# Patient Record
Sex: Male | Born: 1945
Health system: Southern US, Community
[De-identification: ages and names within clinical notes are randomized; demographics above are authoritative.]

## PROBLEM LIST (undated history)

## (undated) DIAGNOSIS — I10 Essential (primary) hypertension: Secondary | ICD-10-CM

## (undated) DIAGNOSIS — I2510 Atherosclerotic heart disease of native coronary artery without angina pectoris: Secondary | ICD-10-CM

## (undated) DIAGNOSIS — D179 Benign lipomatous neoplasm, unspecified: Secondary | ICD-10-CM

## (undated) DIAGNOSIS — T4145XA Adverse effect of unspecified anesthetic, initial encounter: Secondary | ICD-10-CM

## (undated) DIAGNOSIS — K5730 Diverticulosis of large intestine without perforation or abscess without bleeding: Secondary | ICD-10-CM

## (undated) DIAGNOSIS — G4733 Obstructive sleep apnea (adult) (pediatric): Secondary | ICD-10-CM

## (undated) DIAGNOSIS — E669 Obesity, unspecified: Secondary | ICD-10-CM

## (undated) DIAGNOSIS — D696 Thrombocytopenia, unspecified: Secondary | ICD-10-CM

## (undated) DIAGNOSIS — T8859XA Other complications of anesthesia, initial encounter: Secondary | ICD-10-CM

## (undated) DIAGNOSIS — N201 Calculus of ureter: Secondary | ICD-10-CM

## (undated) DIAGNOSIS — Z8719 Personal history of other diseases of the digestive system: Secondary | ICD-10-CM

## (undated) DIAGNOSIS — M25562 Pain in left knee: Secondary | ICD-10-CM

## (undated) DIAGNOSIS — E559 Vitamin D deficiency, unspecified: Secondary | ICD-10-CM

## (undated) DIAGNOSIS — Z77098 Contact with and (suspected) exposure to other hazardous, chiefly nonmedicinal, chemicals: Secondary | ICD-10-CM

## (undated) DIAGNOSIS — E785 Hyperlipidemia, unspecified: Secondary | ICD-10-CM

## (undated) DIAGNOSIS — E119 Type 2 diabetes mellitus without complications: Secondary | ICD-10-CM

## (undated) DIAGNOSIS — N200 Calculus of kidney: Secondary | ICD-10-CM

## (undated) DIAGNOSIS — M25561 Pain in right knee: Secondary | ICD-10-CM

## (undated) HISTORY — DX: Obesity, unspecified: E66.9

## (undated) HISTORY — DX: Vitamin D deficiency, unspecified: E55.9

## (undated) HISTORY — DX: Hyperlipidemia, unspecified: E78.5

## (undated) HISTORY — DX: Pain in left knee: M25.562

## (undated) HISTORY — PX: COLONOSCOPY W/ POLYPECTOMY: SHX1380

## (undated) HISTORY — DX: Calculus of kidney: N20.0

## (undated) HISTORY — DX: Obstructive sleep apnea (adult) (pediatric): G47.33

## (undated) HISTORY — DX: Contact with and (suspected) exposure to other hazardous, chiefly nonmedicinal, chemicals: Z77.098

## (undated) HISTORY — DX: Thrombocytopenia, unspecified: D69.6

## (undated) HISTORY — DX: Pain in right knee: M25.561

---

## 1973-03-15 HISTORY — PX: LIPOMA EXCISION: SHX5283

## 2010-12-02 ENCOUNTER — Other Ambulatory Visit: Payer: Self-pay | Admitting: Orthopedic Surgery

## 2010-12-02 DIAGNOSIS — M25561 Pain in right knee: Secondary | ICD-10-CM

## 2010-12-02 DIAGNOSIS — R531 Weakness: Secondary | ICD-10-CM

## 2010-12-07 ENCOUNTER — Ambulatory Visit
Admission: RE | Admit: 2010-12-07 | Discharge: 2010-12-07 | Disposition: A | Discharge status: Home / Self Care | Payer: Medicare Other | Source: Ambulatory Visit | Attending: Orthopedic Surgery | Admitting: Orthopedic Surgery

## 2010-12-07 DIAGNOSIS — R531 Weakness: Secondary | ICD-10-CM

## 2010-12-07 DIAGNOSIS — M25561 Pain in right knee: Secondary | ICD-10-CM

## 2011-01-29 HISTORY — PX: KNEE ARTHROSCOPY W/ MENISCECTOMY: SHX1879

## 2011-03-17 DIAGNOSIS — IMO0002 Reserved for concepts with insufficient information to code with codable children: Secondary | ICD-10-CM | POA: Diagnosis not present

## 2011-03-19 DIAGNOSIS — IMO0002 Reserved for concepts with insufficient information to code with codable children: Secondary | ICD-10-CM | POA: Diagnosis not present

## 2011-03-22 DIAGNOSIS — IMO0002 Reserved for concepts with insufficient information to code with codable children: Secondary | ICD-10-CM | POA: Diagnosis not present

## 2011-03-24 DIAGNOSIS — IMO0002 Reserved for concepts with insufficient information to code with codable children: Secondary | ICD-10-CM | POA: Diagnosis not present

## 2011-03-26 DIAGNOSIS — IMO0002 Reserved for concepts with insufficient information to code with codable children: Secondary | ICD-10-CM | POA: Diagnosis not present

## 2011-03-29 DIAGNOSIS — IMO0002 Reserved for concepts with insufficient information to code with codable children: Secondary | ICD-10-CM | POA: Diagnosis not present

## 2011-03-31 DIAGNOSIS — IMO0002 Reserved for concepts with insufficient information to code with codable children: Secondary | ICD-10-CM | POA: Diagnosis not present

## 2011-04-02 DIAGNOSIS — IMO0002 Reserved for concepts with insufficient information to code with codable children: Secondary | ICD-10-CM | POA: Diagnosis not present

## 2011-04-06 DIAGNOSIS — IMO0002 Reserved for concepts with insufficient information to code with codable children: Secondary | ICD-10-CM | POA: Diagnosis not present

## 2011-04-07 DIAGNOSIS — M23329 Other meniscus derangements, posterior horn of medial meniscus, unspecified knee: Secondary | ICD-10-CM | POA: Diagnosis not present

## 2011-04-07 DIAGNOSIS — M1710 Unilateral primary osteoarthritis, unspecified knee: Secondary | ICD-10-CM | POA: Diagnosis not present

## 2011-04-14 DIAGNOSIS — M1710 Unilateral primary osteoarthritis, unspecified knee: Secondary | ICD-10-CM | POA: Diagnosis not present

## 2011-04-19 DIAGNOSIS — M1710 Unilateral primary osteoarthritis, unspecified knee: Secondary | ICD-10-CM | POA: Diagnosis not present

## 2011-04-19 DIAGNOSIS — M23329 Other meniscus derangements, posterior horn of medial meniscus, unspecified knee: Secondary | ICD-10-CM | POA: Diagnosis not present

## 2011-05-11 DIAGNOSIS — E119 Type 2 diabetes mellitus without complications: Secondary | ICD-10-CM | POA: Diagnosis not present

## 2011-05-11 DIAGNOSIS — I10 Essential (primary) hypertension: Secondary | ICD-10-CM | POA: Diagnosis not present

## 2011-05-11 DIAGNOSIS — E785 Hyperlipidemia, unspecified: Secondary | ICD-10-CM | POA: Diagnosis not present

## 2011-06-13 ENCOUNTER — Encounter (HOSPITAL_COMMUNITY): Payer: Self-pay | Admitting: *Deleted

## 2011-06-13 ENCOUNTER — Emergency Department (HOSPITAL_COMMUNITY): Payer: Medicare Other

## 2011-06-13 ENCOUNTER — Inpatient Hospital Stay (HOSPITAL_COMMUNITY)
Admission: EM | Admit: 2011-06-13 | Discharge: 2011-06-18 | DRG: 390 | Disposition: A | Discharge status: Home / Self Care | Payer: Medicare Other | Attending: Internal Medicine | Admitting: Internal Medicine

## 2011-06-13 DIAGNOSIS — K56609 Unspecified intestinal obstruction, unspecified as to partial versus complete obstruction: Principal | ICD-10-CM | POA: Diagnosis present

## 2011-06-13 DIAGNOSIS — Z79899 Other long term (current) drug therapy: Secondary | ICD-10-CM

## 2011-06-13 DIAGNOSIS — R1013 Epigastric pain: Secondary | ICD-10-CM | POA: Diagnosis not present

## 2011-06-13 DIAGNOSIS — R109 Unspecified abdominal pain: Secondary | ICD-10-CM | POA: Diagnosis not present

## 2011-06-13 DIAGNOSIS — I10 Essential (primary) hypertension: Secondary | ICD-10-CM

## 2011-06-13 DIAGNOSIS — Z6833 Body mass index (BMI) 33.0-33.9, adult: Secondary | ICD-10-CM

## 2011-06-13 DIAGNOSIS — R1011 Right upper quadrant pain: Secondary | ICD-10-CM | POA: Diagnosis not present

## 2011-06-13 DIAGNOSIS — E119 Type 2 diabetes mellitus without complications: Secondary | ICD-10-CM | POA: Diagnosis present

## 2011-06-13 DIAGNOSIS — R197 Diarrhea, unspecified: Secondary | ICD-10-CM | POA: Diagnosis present

## 2011-06-13 DIAGNOSIS — K7689 Other specified diseases of liver: Secondary | ICD-10-CM | POA: Diagnosis not present

## 2011-06-13 DIAGNOSIS — Z7982 Long term (current) use of aspirin: Secondary | ICD-10-CM

## 2011-06-13 DIAGNOSIS — Z8719 Personal history of other diseases of the digestive system: Secondary | ICD-10-CM

## 2011-06-13 DIAGNOSIS — Z87891 Personal history of nicotine dependence: Secondary | ICD-10-CM

## 2011-06-13 DIAGNOSIS — E785 Hyperlipidemia, unspecified: Secondary | ICD-10-CM | POA: Diagnosis present

## 2011-06-13 DIAGNOSIS — Z96659 Presence of unspecified artificial knee joint: Secondary | ICD-10-CM

## 2011-06-13 DIAGNOSIS — R112 Nausea with vomiting, unspecified: Secondary | ICD-10-CM | POA: Diagnosis present

## 2011-06-13 DIAGNOSIS — R1084 Generalized abdominal pain: Secondary | ICD-10-CM | POA: Diagnosis present

## 2011-06-13 DIAGNOSIS — K5289 Other specified noninfective gastroenteritis and colitis: Secondary | ICD-10-CM | POA: Diagnosis present

## 2011-06-13 DIAGNOSIS — Z794 Long term (current) use of insulin: Secondary | ICD-10-CM

## 2011-06-13 DIAGNOSIS — Z888 Allergy status to other drugs, medicaments and biological substances status: Secondary | ICD-10-CM

## 2011-06-13 HISTORY — DX: Essential (primary) hypertension: I10

## 2011-06-13 LAB — CBC
MCH: 32.5 pg (ref 26.0–34.0)
MCHC: 36.6 g/dL — ABNORMAL HIGH (ref 30.0–36.0)
MCV: 88.8 fL (ref 78.0–100.0)
Platelets: 184 10*3/uL (ref 150–400)

## 2011-06-13 LAB — COMPREHENSIVE METABOLIC PANEL
ALT: 35 U/L (ref 0–53)
Albumin: 3.9 g/dL (ref 3.5–5.2)
Alkaline Phosphatase: 88 U/L (ref 39–117)
BUN: 21 mg/dL (ref 6–23)
Calcium: 9 mg/dL (ref 8.4–10.5)
GFR calc Af Amer: >90 mL/min (ref >90)
Glucose, Bld: 301 mg/dL — ABNORMAL HIGH (ref 70–99)
Potassium: 4.6 mEq/L (ref 3.5–5.1)
Sodium: 133 mEq/L — ABNORMAL LOW (ref 135–145)
Total Protein: 7.6 g/dL (ref 6.0–8.3)

## 2011-06-13 LAB — DIFFERENTIAL
Basophils Relative: 0 % (ref 0–1)
Eosinophils Absolute: 0 10*3/uL (ref 0.0–0.7)
Eosinophils Relative: 0 % (ref 0–5)
Lymphs Abs: 0.7 10*3/uL (ref 0.7–4.0)
Neutrophils Relative %: 86 % — ABNORMAL HIGH (ref 43–77)

## 2011-06-13 LAB — GLUCOSE, CAPILLARY: Glucose-Capillary: 321 mg/dL — ABNORMAL HIGH (ref 70–99)

## 2011-06-13 MED ORDER — HYDROMORPHONE HCL PF 1 MG/ML IJ SOLN
1.0000 mg | Freq: Once | INTRAMUSCULAR | Status: AC
  Administered 2011-06-14: 1 mg via INTRAVENOUS
  Filled 2011-06-13: qty 1

## 2011-06-13 MED ORDER — IOHEXOL 300 MG/ML  SOLN
100.0000 mL | Freq: Once | INTRAMUSCULAR | Status: AC | PRN
  Administered 2011-06-13: 100 mL via INTRAVENOUS

## 2011-06-13 MED ORDER — ONDANSETRON HCL 4 MG/2ML IJ SOLN
4.0000 mg | Freq: Once | INTRAMUSCULAR | Status: AC
  Administered 2011-06-13: 4 mg via INTRAVENOUS
  Filled 2011-06-13: qty 2

## 2011-06-13 MED ORDER — MORPHINE SULFATE 4 MG/ML IJ SOLN
4.0000 mg | Freq: Once | INTRAMUSCULAR | Status: AC
  Administered 2011-06-13: 4 mg via INTRAVENOUS
  Filled 2011-06-13: qty 1

## 2011-06-13 MED ORDER — SODIUM CHLORIDE 0.9 % IV BOLUS (SEPSIS)
1000.0000 mL | Freq: Once | INTRAVENOUS | Status: AC
  Administered 2011-06-13: 1000 mL via INTRAVENOUS

## 2011-06-13 MED ORDER — IOHEXOL 300 MG/ML  SOLN
40.0000 mL | Freq: Once | INTRAMUSCULAR | Status: AC | PRN
  Administered 2011-06-13: 40 mL via ORAL

## 2011-06-13 NOTE — ED Notes (Signed)
PT provided with fluids to rinse mouth. Family at bedside

## 2011-06-13 NOTE — ED Provider Notes (Signed)
History     CSN: [id number]  Arrival date & time 06/13/11  2057   First MD Initiated Contact with Patient 06/13/11 2124      Chief Complaint  Patient presents with  . Abdominal Pain    (Consider location/radiation/quality/duration/timing/severity/associated sxs/prior treatment) Patient is a 66 y.o. male presenting with abdominal pain. The history is provided by the patient and the spouse.  Abdominal Pain The primary symptoms of the illness include abdominal pain, nausea, vomiting and diarrhea. The primary symptoms of the illness do not include fever, shortness of breath, hematemesis, hematochezia or dysuria. The current episode started yesterday. The onset of the illness was sudden. The problem has been gradually worsening.  The abdominal pain is located in the RUQ, epigastric region and LUQ. The abdominal pain does not radiate. The severity of the abdominal pain is 4/10. The abdominal pain is relieved by nothing. The abdominal pain is exacerbated by belching.  Vomiting occurs 2 to 5 times per day. The emesis contains stomach contents.  The diarrhea occurs 5 to 10 times per day.  Symptoms associated with the illness do not include chills or constipation.    Past Medical History  Diagnosis Date  . Diabetes mellitus   . Hypertension     History reviewed. No pertinent past surgical history.  No family history on file.  History  Substance Use Topics  . Smoking status: Never Smoker   . Smokeless tobacco: Not on file  . Alcohol Use: No      Review of Systems  Constitutional: Negative for fever and chills.  HENT: Negative for congestion and rhinorrhea.   Respiratory: Negative for cough and shortness of breath.   Cardiovascular: Negative for chest pain and leg swelling.  Gastrointestinal: Positive for nausea, vomiting, abdominal pain, diarrhea and abdominal distention. Negative for constipation, blood in stool, hematochezia and hematemesis.  Genitourinary: Negative for  dysuria and decreased urine volume.  Neurological: Negative for numbness and headaches.  Psychiatric/Behavioral: Negative for confusion.  All other systems reviewed and are negative.    Allergies  Ciprofloxacin; Lipitor; Lisinopril; and Metformin and related  Home Medications   Current Outpatient Rx  Name Route Sig Dispense Refill  . ACETAMINOPHEN 500 MG PO TABS Oral Take 1,000 mg by mouth every 6 (six) hours as needed. For pain    . ASPIRIN EC 81 MG PO TBEC Oral Take 81 mg by mouth daily.    [name] GLIPIZIDE ER 10 MG PO TB24 Oral Take 10 mg by mouth 2 (two) times daily.    . INSULIN DETEMIR 100 UNIT/ML [place] SOLN Subcutaneous Inject 90 Units into the skin at bedtime.    [name] LOSARTAN POTASSIUM 50 MG PO TABS Oral Take 50 mg by mouth daily.    . CENTRUM SILVER ULTRA MENS PO Oral Take 1 tablet by mouth daily.    [name] SIMVASTATIN 20 MG PO TABS Oral Take 20 mg by mouth every evening.      BP 163/85  Pulse 111  Temp(Src) 97.5 F (36.4 C) (Oral)  Resp 20  SpO2 95%  Physical Exam  Nursing note and vitals reviewed. Constitutional: He is oriented to person, place, and time. He appears well-developed and well-nourished.  HENT:  Head: Normocephalic and atraumatic.  Right Ear: External ear normal.  Left Ear: External ear normal.  Nose: Nose normal.  Neck: Neck supple.  Cardiovascular: Normal rate, regular rhythm, normal heart sounds and intact distal pulses.   Pulmonary/Chest: Effort normal and breath sounds normal. No respiratory distress. He has  no wheezes. He has no rales.  Abdominal: Soft. He exhibits distension. He exhibits no fluid wave, no ascites and no mass. There is tenderness in the right upper quadrant, right lower quadrant, epigastric area and left upper quadrant. There is no rebound and no guarding.  Musculoskeletal: He exhibits no edema.  Lymphadenopathy:    He has no cervical adenopathy.  Neurological: He is alert and oriented to person, place, and time.  Skin: Skin is warm and  dry.    ED Course  Procedures (including critical care time)  Labs Reviewed  CBC - Abnormal; Notable for the following:    Hemoglobin 17.7 (*)    MCHC 36.6 (*)    All other components within normal limits  DIFFERENTIAL - Abnormal; Notable for the following:    Neutrophils Relative 86 (*)    Lymphocytes Relative 9 (*)    All other components within normal limits  COMPREHENSIVE METABOLIC PANEL - Abnormal; Notable for the following:    Sodium 133 (*)    Chloride 95 (*)    Glucose, Bld 301 (*)    AST 39 (*) HEMOLYSIS AT THIS LEVEL MAY AFFECT RESULT   All other components within normal limits  GLUCOSE, CAPILLARY - Abnormal; Notable for the following:    Glucose-Capillary 321 (*)    All other components within normal limits  LIPASE, BLOOD - Abnormal; Notable for the following:    Lipase 9 (*)    All other components within normal limits  URINALYSIS, ROUTINE W REFLEX MICROSCOPIC   Ct Abdomen Pelvis W Contrast  06/13/2011  *RADIOLOGY REPORT*  Clinical Data: Bilateral mid and lower abdominal pain for 24 hours. Nausea and vomiting.  CT ABDOMEN AND PELVIS WITH CONTRAST  Technique:  Multidetector CT imaging of the abdomen and pelvis was performed following the standard protocol during bolus administration of intravenous contrast.  Contrast: OMNIPAQUE IOHEXOL 300 MG/ML IJ SOLN, 40mL OMNIPAQUE IOHEXOL 300 MG/ML IJ SOLN  Comparison: None.  Findings: Atelectasis in the lung bases.  Probable coronary artery calcifications.  Diffuse low attenuation change throughout the liver consistent with fatty infiltration.  The gallbladder, spleen, adrenal glands, kidneys, pancreas, and retroperitoneal lymph nodes are unremarkable.  Mild calcification of the abdominal aorta without aneurysm.  The stomach is mildly distended without focal wall thickening.  The there is mild distension of mostly fluid-filled small bowel extending throughout the jejunum with decompression of the terminal ileum.  The transition  zone is located in the right lower quadrant.  No mass or obstructing lesion is demonstrated. Changes suggest an adhesion.  No free fluid or free air in the abdomen.  Prominent visceral adipose tissues.  Pelvis:  Prostate gland is not enlarged.  No free or loculated pelvic fluid collections.  No significant pelvic lymphadenopathy. No inflammatory changes involving the sigmoid colon.  The colon is mostly decompressed without wall thickening.  The appendix is normal.  Degenerative changes in the lumbar spine.  The  IMPRESSION: Small bowel obstruction with transition zone in the right lower quadrant.  No obstructing mass lesion demonstrated.  Changes likely due to adhesion or focal stricture.  Diffuse fatty infiltration of the liver.  Original Report Authenticated By: Marlon Pel, M.D.     1. Small bowel obstruction       MDM  66 yo male presents with 24 hours of N/V/D and diffuse abd pain. Not peritonitic. Tachycardic which improved with fluids, likely from pain and dehyrdation. CT scan shows SBO with transition in RLQ. Dr. Lindie Spruce of  surgery consulted, feels should start non-op and admit to medicine.        Pricilla Loveless, MD 06/14/11 0040

## 2011-06-13 NOTE — ED Notes (Signed)
Pt c/o  abd pain with n v for 24 hours.  Some diarrhea also

## 2011-06-13 NOTE — ED Notes (Signed)
PT repositioned.

## 2011-06-13 NOTE — ED Notes (Signed)
Pt wife states that he has been having abdominal pain since Friday. Pt states that the pain did start in one spot now is generalized. Pt states that he has been vomiting and having diarrhea for the past couple of days. Pt is Diabetic and has not been taking insulin or CBG's due to not eating. Pt wife states that he appears bloated more than normal. Bowel sounds present in all quadrants.

## 2011-06-14 ENCOUNTER — Encounter (HOSPITAL_COMMUNITY): Payer: Self-pay | Admitting: *Deleted

## 2011-06-14 DIAGNOSIS — K5289 Other specified noninfective gastroenteritis and colitis: Secondary | ICD-10-CM | POA: Diagnosis present

## 2011-06-14 DIAGNOSIS — Z888 Allergy status to other drugs, medicaments and biological substances status: Secondary | ICD-10-CM | POA: Diagnosis not present

## 2011-06-14 DIAGNOSIS — R1011 Right upper quadrant pain: Secondary | ICD-10-CM | POA: Diagnosis not present

## 2011-06-14 DIAGNOSIS — R112 Nausea with vomiting, unspecified: Secondary | ICD-10-CM | POA: Diagnosis present

## 2011-06-14 DIAGNOSIS — Z794 Long term (current) use of insulin: Secondary | ICD-10-CM | POA: Diagnosis not present

## 2011-06-14 DIAGNOSIS — Z7982 Long term (current) use of aspirin: Secondary | ICD-10-CM | POA: Diagnosis not present

## 2011-06-14 DIAGNOSIS — R1084 Generalized abdominal pain: Secondary | ICD-10-CM | POA: Diagnosis present

## 2011-06-14 DIAGNOSIS — Z79899 Other long term (current) drug therapy: Secondary | ICD-10-CM | POA: Diagnosis not present

## 2011-06-14 DIAGNOSIS — Z87891 Personal history of nicotine dependence: Secondary | ICD-10-CM | POA: Diagnosis not present

## 2011-06-14 DIAGNOSIS — E785 Hyperlipidemia, unspecified: Secondary | ICD-10-CM | POA: Diagnosis present

## 2011-06-14 DIAGNOSIS — R1013 Epigastric pain: Secondary | ICD-10-CM | POA: Diagnosis not present

## 2011-06-14 DIAGNOSIS — Z96659 Presence of unspecified artificial knee joint: Secondary | ICD-10-CM | POA: Diagnosis not present

## 2011-06-14 DIAGNOSIS — K56609 Unspecified intestinal obstruction, unspecified as to partial versus complete obstruction: Secondary | ICD-10-CM | POA: Diagnosis not present

## 2011-06-14 DIAGNOSIS — IMO0001 Reserved for inherently not codable concepts without codable children: Secondary | ICD-10-CM | POA: Diagnosis not present

## 2011-06-14 DIAGNOSIS — Z8719 Personal history of other diseases of the digestive system: Secondary | ICD-10-CM

## 2011-06-14 DIAGNOSIS — I10 Essential (primary) hypertension: Secondary | ICD-10-CM | POA: Diagnosis present

## 2011-06-14 DIAGNOSIS — E119 Type 2 diabetes mellitus without complications: Secondary | ICD-10-CM | POA: Diagnosis present

## 2011-06-14 DIAGNOSIS — R109 Unspecified abdominal pain: Secondary | ICD-10-CM | POA: Diagnosis not present

## 2011-06-14 DIAGNOSIS — R197 Diarrhea, unspecified: Secondary | ICD-10-CM | POA: Diagnosis present

## 2011-06-14 DIAGNOSIS — Z6833 Body mass index (BMI) 33.0-33.9, adult: Secondary | ICD-10-CM | POA: Diagnosis not present

## 2011-06-14 LAB — HEMOGLOBIN A1C
Hgb A1c MFr Bld: 8.9 % — ABNORMAL HIGH (ref <5.7)
Mean Plasma Glucose: 209 mg/dL — ABNORMAL HIGH (ref <117)

## 2011-06-14 LAB — URINALYSIS, ROUTINE W REFLEX MICROSCOPIC
Bilirubin Urine: NEGATIVE
Hgb urine dipstick: NEGATIVE
Ketones, ur: 15 mg/dL — AB
Nitrite: NEGATIVE
Protein, ur: NEGATIVE mg/dL
Urobilinogen, UA: 0.2 mg/dL (ref 0.0–1.0)

## 2011-06-14 LAB — GLUCOSE, CAPILLARY
Glucose-Capillary: 259 mg/dL — ABNORMAL HIGH (ref 70–99)
Glucose-Capillary: 223 mg/dL — ABNORMAL HIGH (ref 70–99)
Glucose-Capillary: 204 mg/dL — ABNORMAL HIGH (ref 70–99)

## 2011-06-14 LAB — BASIC METABOLIC PANEL
Chloride: 102 mEq/L (ref 96–112)
GFR calc Af Amer: >90 mL/min (ref >90)
GFR calc non Af Amer: >90 mL/min (ref >90)
Potassium: 4 mEq/L (ref 3.5–5.1)
Sodium: 137 mEq/L (ref 135–145)

## 2011-06-14 LAB — CBC
Hemoglobin: 15.5 g/dL (ref 13.0–17.0)
RBC: 4.88 MIL/uL (ref 4.22–5.81)

## 2011-06-14 MED ORDER — HYDROMORPHONE HCL PF 1 MG/ML IJ SOLN
0.5000 mg | INTRAMUSCULAR | Status: DC | PRN
  Administered 2011-06-14 (×2): 1 mg via INTRAVENOUS
  Filled 2011-06-14 (×2): qty 1

## 2011-06-14 MED ORDER — CHLORPROMAZINE HCL 25 MG/ML IJ SOLN
25.0000 mg | Freq: Once | INTRAMUSCULAR | Status: AC | PRN
  Filled 2011-06-14: qty 1

## 2011-06-14 MED ORDER — INSULIN ASPART 100 UNIT/ML ~~LOC~~ SOLN
0.0000 [IU] | SUBCUTANEOUS | Status: DC
  Administered 2011-06-14: 3 [IU] via SUBCUTANEOUS
  Administered 2011-06-14: 5 [IU] via SUBCUTANEOUS
  Administered 2011-06-14: 3 [IU] via SUBCUTANEOUS
  Administered 2011-06-14: 7 [IU] via SUBCUTANEOUS

## 2011-06-14 MED ORDER — ONDANSETRON HCL 4 MG/2ML IJ SOLN: 4.0000 mg | Freq: Four times a day (QID) | INTRAMUSCULAR | Status: DC | PRN

## 2011-06-14 MED ORDER — INSULIN DETEMIR 100 UNIT/ML ~~LOC~~ SOLN
15.0000 [IU] | Freq: Every day | SUBCUTANEOUS | Status: DC
  Administered 2011-06-14: 15 [IU] via SUBCUTANEOUS
  Filled 2011-06-14: qty 10

## 2011-06-14 MED ORDER — INSULIN DETEMIR 100 UNIT/ML ~~LOC~~ SOLN
40.0000 [IU] | Freq: Every day | SUBCUTANEOUS | Status: DC
  Filled 2011-06-14: qty 10

## 2011-06-14 MED ORDER — INSULIN ASPART 100 UNIT/ML ~~LOC~~ SOLN
0.0000 [IU] | SUBCUTANEOUS | Status: DC
  Administered 2011-06-14: 3 [IU] via SUBCUTANEOUS
  Administered 2011-06-14: 5 [IU] via SUBCUTANEOUS
  Administered 2011-06-15: 2 [IU] via SUBCUTANEOUS
  Administered 2011-06-15 (×3): 3 [IU] via SUBCUTANEOUS
  Administered 2011-06-16 (×2): 2 [IU] via SUBCUTANEOUS
  Administered 2011-06-17: 5 [IU] via SUBCUTANEOUS
  Administered 2011-06-17 – 2011-06-18 (×3): 3 [IU] via SUBCUTANEOUS

## 2011-06-14 MED ORDER — BIOTENE DRY MOUTH MT LIQD
15.0000 mL | Freq: Two times a day (BID) | OROMUCOSAL | Status: DC
  Administered 2011-06-14 – 2011-06-17 (×7): 15 mL via OROMUCOSAL

## 2011-06-14 MED ORDER — ENOXAPARIN SODIUM 40 MG/0.4ML ~~LOC~~ SOLN
40.0000 mg | SUBCUTANEOUS | Status: DC
  Administered 2011-06-14: 40 mg via SUBCUTANEOUS
  Filled 2011-06-14 (×2): qty 0.4

## 2011-06-14 MED ORDER — HYDROMORPHONE HCL PF 1 MG/ML IJ SOLN
0.5000 mg | INTRAMUSCULAR | Status: DC | PRN
  Administered 2011-06-14: 0.5 mg via INTRAVENOUS
  Administered 2011-06-14: 20:00:00 via INTRAVENOUS
  Administered 2011-06-15 (×3): 0.5 mg via INTRAVENOUS
  Filled 2011-06-14 (×5): qty 1

## 2011-06-14 MED ORDER — PHENOL 1.4 % MT LIQD
1.0000 | OROMUCOSAL | Status: DC | PRN
  Administered 2011-06-14 (×3): 1 via OROMUCOSAL
  Filled 2011-06-14: qty 177

## 2011-06-14 MED ORDER — SODIUM CHLORIDE 0.9 % IV SOLN
INTRAVENOUS | Status: DC
  Administered 2011-06-14: 1000 mL via INTRAVENOUS
  Administered 2011-06-14: 125 mL/h via INTRAVENOUS
  Administered 2011-06-14: 01:00:00 via INTRAVENOUS

## 2011-06-14 MED ORDER — ACETAMINOPHEN 650 MG RE SUPP: 650.0000 mg | Freq: Four times a day (QID) | RECTAL | Status: DC | PRN

## 2011-06-14 MED ORDER — ACETAMINOPHEN 325 MG PO TABS: 650.0000 mg | ORAL_TABLET | Freq: Four times a day (QID) | ORAL | Status: DC | PRN

## 2011-06-14 MED ORDER — ONDANSETRON HCL 4 MG PO TABS: 4.0000 mg | ORAL_TABLET | Freq: Four times a day (QID) | ORAL | Status: DC | PRN

## 2011-06-14 NOTE — Consult Note (Signed)
Renard Hamper 1945-07-28  [id number].   Primary Care MD: Dr. Lawerance Bach, [facility]) Requesting MD: Dr. Jerral Ralph  Chief Complaint/Reason for Consult: SBO HPI: This is a 66 yo male who has never had abdominal surgery who began having abdominal pain with n/v/d on Saturday night.  His last BM was on Sunday morning at 0600am.  Since then he has not passed any flatus or had any other BMs.  He has continued to have nausea and vomiting along with pain.  He went to [facility] last night.  He had a CT scan completed that revealed a SBO with a transition point.  He had an NGT placed and we have been asked to see him for further evaluation.  He does note he had a SBO in 1987 after having a virus, but improved with conservative management.  Review of Systems:  Please see HPI, otherwise all other systems are negative except for some "locking" of his right knee.  FH: 1. Mother- ovarian Ca 2. Father - Skin Ca  Past Medical History  Diagnosis Date  . Diabetes mellitus   . Hypertension     Past Surgical History  Procedure Date  . Right knee arthroscopy     Social History:  reports that he quit smoking about 44 years ago. His smoking use included Cigarettes. He has never used smokeless tobacco. He reports that he does not drink alcohol or use illicit drugs.  Allergies:  Allergies  Allergen Reactions  . Ciprofloxacin Other (See Comments)    Abdominal pain  . Lipitor (Atorvastatin Calcium) Other (See Comments)    Severe muscle pain  . Lisinopril Cough  . Metformin And Related Other (See Comments)    diarrhea    Medications Prior to Admission  Medication Dose Route Frequency Provider Last Rate Last Dose  . 0.9 %  sodium chloride infusion   Intravenous Continuous Letha Cape, PA 100 mL/hr at 06/14/11 0120    . acetaminophen (TYLENOL) tablet 650 mg  650 mg Oral Q6H PRN Ron Parker, MD       Or  . acetaminophen (TYLENOL) suppository 650 mg  650 mg Rectal Q6H PRN Ron Parker, MD       . antiseptic oral rinse (BIOTENE) solution 15 mL  15 mL Mouth Rinse BID Ron Parker, MD   15 mL at 06/14/11 0806  . chlorproMAZINE (THORAZINE) injection 25 mg  25 mg Intramuscular Once PRN Ron Parker, MD      . enoxaparin (LOVENOX) injection 40 mg  40 mg Subcutaneous Q24H Harvette C Lovell Sheehan, MD      . HYDROmorphone (DILAUDID) injection 0.5-1 mg  0.5-1 mg Intravenous Q3H PRN Ron Parker, MD   1 mg at 06/14/11 0755  . HYDROmorphone (DILAUDID) injection 1 mg  1 mg Intravenous Once Pricilla Loveless, MD   1 mg at 06/14/11 0001  . insulin aspart (novoLOG) injection 0-9 Units  0-9 Units Subcutaneous Q4H Ron Parker, MD   5 Units at 06/14/11 0429  . insulin detemir (LEVEMIR) injection 40 Units  40 Units Subcutaneous QHS Harvette C Jenkins, MD      . iohexol (OMNIPAQUE) 300 MG/ML solution 100 mL  100 mL Intravenous Once PRN Felisa Bonier, MD   100 mL at 06/13/11 2331  . iohexol (OMNIPAQUE) 300 MG/ML solution 40 mL  40 mL Oral Once PRN Felisa Bonier, MD   40 mL at 06/13/11 2331  . morphine 4 MG/ML injection 4 mg  4 mg Intravenous  Once Pricilla Loveless, MD   4 mg at 06/13/11 2220  . ondansetron (ZOFRAN) injection 4 mg  4 mg Intravenous Once Pricilla Loveless, MD   4 mg at 06/13/11 2220  . ondansetron (ZOFRAN) tablet 4 mg  4 mg Oral Q6H PRN Ron Parker, MD       Or  . ondansetron (ZOFRAN) injection 4 mg  4 mg Intravenous Q6H PRN Ron Parker, MD      . sodium chloride 0.9 % bolus 1,000 mL  1,000 mL Intravenous Once Pricilla Loveless, MD   1,000 mL at 06/13/11 2212  . sodium chloride 0.9 % bolus 1,000 mL  1,000 mL Intravenous Once Pricilla Loveless, MD   1,000 mL at 06/13/11 2212   No current outpatient prescriptions on file as of 06/14/2011.    Blood pressure 149/82, pulse 107, temperature 98 F (36.7 C), temperature source Oral, resp. rate 18, SpO2 93.00%. Physical Exam:   General: pleasant, WD, WN white male who is laying in bed in NAD HEENT: head is normocephalic,  atraumatic.  Sclera are noninjected.  PERRL.  Ears and nose without any masses or lesions.  Mouth is pink and moist Heart: regular rhythm, slightly tachy.  Normal s1,s2. No obvious murmurs, gallops, or rubs noted.  Palpable radial and pedal pulses bilaterally Lungs: CTAB, no wheezes, rhonchi, or rales noted.  Respiratory effort nonlabored Abd: soft, but tympanetic, diffusely tender throughout the abdomen.  No guarding or rebounding.  No peritoneal signs.  Hypoactive BS.  Maybe a small reducible umbilical hernia, but no other masses, hernias, or organomegaly MS: all 4 extremities are symmetrical with no cyanosis, clubbing, or edema. Skin: warm and dry with no masses, lesions, or rashes Psych: A&Ox3 with an appropriate affect.     Results for orders placed during the hospital encounter of 06/13/11 (from the past 48 hour(s))  CBC     Status: Abnormal   Collection Time   06/13/11  9:15 PM      Component Value Range Comment   WBC 7.8  4.0 - 10.5 (K/uL)    RBC 5.45  4.22 - 5.81 (MIL/uL)    Hemoglobin 17.7 (*) 13.0 - 17.0 (g/dL)    HCT 45.4  09.8 - 11.9 (%)    MCV 88.8  78.0 - 100.0 (fL)    MCH 32.5  26.0 - 34.0 (pg)    MCHC 36.6 (*) 30.0 - 36.0 (g/dL)    RDW 14.7  82.9 - 56.2 (%)    Platelets 184  150 - 400 (K/uL)   DIFFERENTIAL     Status: Abnormal   Collection Time   06/13/11  9:15 PM      Component Value Range Comment   Neutrophils Relative 86 (*) 43 - 77 (%)    Neutro Abs 6.7  1.7 - 7.7 (K/uL)    Lymphocytes Relative 9 (*) 12 - 46 (%)    Lymphs Abs 0.7  0.7 - 4.0 (K/uL)    Monocytes Relative 5  3 - 12 (%)    Monocytes Absolute 0.4  0.1 - 1.0 (K/uL)    Eosinophils Relative 0  0 - 5 (%)    Eosinophils Absolute 0.0  0.0 - 0.7 (K/uL)    Basophils Relative 0  0 - 1 (%)    Basophils Absolute 0.0  0.0 - 0.1 (K/uL)   COMPREHENSIVE METABOLIC PANEL     Status: Abnormal   Collection Time   06/13/11  9:15 PM      Component Value Range  Comment   Sodium 133 (*) 135 - 145 (mEq/L)    Potassium  4.6  3.5 - 5.1 (mEq/L) HEMOLYSIS AT THIS LEVEL MAY AFFECT RESULT   Chloride 95 (*) 96 - 112 (mEq/L)    CO2 22  19 - 32 (mEq/L)    Glucose, Bld 301 (*) 70 - 99 (mg/dL)    BUN 21  6 - 23 (mg/dL)    Creatinine, Ser 1.61  0.50 - 1.35 (mg/dL)    Calcium 9.0  8.4 - 10.5 (mg/dL)    Total Protein 7.6  6.0 - 8.3 (g/dL)    Albumin 3.9  3.5 - 5.2 (g/dL)    AST 39 (*) 0 - 37 (U/L) HEMOLYSIS AT THIS LEVEL MAY AFFECT RESULT   ALT 35  0 - 53 (U/L)    Alkaline Phosphatase 88  39 - 117 (U/L)    Total Bilirubin 1.1  0.3 - 1.2 (mg/dL)    GFR calc non Af Amer >90  >90 (mL/min)    GFR calc Af Amer >90  >90 (mL/min)   LIPASE, BLOOD     Status: Abnormal   Collection Time   06/13/11  9:15 PM      Component Value Range Comment   Lipase 9 (*) 11 - 59 (U/L)   GLUCOSE, CAPILLARY     Status: Abnormal   Collection Time   06/13/11  9:37 PM      Component Value Range Comment   Glucose-Capillary 321 (*) 70 - 99 (mg/dL)   GLUCOSE, CAPILLARY     Status: Abnormal   Collection Time   06/14/11 12:58 AM      Component Value Range Comment   Glucose-Capillary 308 (*) 70 - 99 (mg/dL)   URINALYSIS, ROUTINE W REFLEX MICROSCOPIC     Status: Abnormal   Collection Time   06/14/11  2:16 AM      Component Value Range Comment   Color, Urine YELLOW  YELLOW     APPearance CLEAR  CLEAR     Specific Gravity, Urine 1.010  1.005 - 1.030     pH 5.0  5.0 - 8.0     Glucose, UA 500 (*) NEGATIVE (mg/dL)    Hgb urine dipstick NEGATIVE  NEGATIVE     Bilirubin Urine NEGATIVE  NEGATIVE     Ketones, ur 15 (*) NEGATIVE (mg/dL)    Protein, ur NEGATIVE  NEGATIVE (mg/dL)    Urobilinogen, UA 0.2  0.0 - 1.0 (mg/dL)    Nitrite NEGATIVE  NEGATIVE     Leukocytes, UA NEGATIVE  NEGATIVE  MICROSCOPIC NOT DONE ON URINES WITH NEGATIVE PROTEIN, BLOOD, LEUKOCYTES, NITRITE, OR GLUCOSE <1000 mg/dL.  GLUCOSE, CAPILLARY     Status: Abnormal   Collection Time   06/14/11  3:45 AM      Component Value Range Comment   Glucose-Capillary 259 (*) 70 - 99 (mg/dL)     BASIC METABOLIC PANEL     Status: Abnormal   Collection Time   06/14/11  6:35 AM      Component Value Range Comment   Sodium 137  135 - 145 (mEq/L)    Potassium 4.0  3.5 - 5.1 (mEq/L)    Chloride 102  96 - 112 (mEq/L)    CO2 27  19 - 32 (mEq/L)    Glucose, Bld 229 (*) 70 - 99 (mg/dL)    BUN 18  6 - 23 (mg/dL)    Creatinine, Ser 0.96  0.50 - 1.35 (mg/dL)    Calcium 8.2 (*) 8.4 -  10.5 (mg/dL)    GFR calc non Af Amer >90  >90 (mL/min)    GFR calc Af Amer >90  >90 (mL/min)   CBC     Status: Abnormal   Collection Time   06/14/11  6:35 AM      Component Value Range Comment   WBC 6.6  4.0 - 10.5 (K/uL)    RBC 4.88  4.22 - 5.81 (MIL/uL)    Hemoglobin 15.5  13.0 - 17.0 (g/dL)    HCT 95.6  21.3 - 08.6 (%)    MCV 88.9  78.0 - 100.0 (fL)    MCH 31.8  26.0 - 34.0 (pg)    MCHC 35.7  30.0 - 36.0 (g/dL)    RDW 57.8  46.9 - 62.9 (%)    Platelets 142 (*) 150 - 400 (K/uL)    Ct Abdomen Pelvis W Contrast  06/13/2011  *RADIOLOGY REPORT*  Clinical Data: Bilateral mid and lower abdominal pain for 24 hours. Nausea and vomiting.  CT ABDOMEN AND PELVIS WITH CONTRAST  Technique:  Multidetector CT imaging of the abdomen and pelvis was performed following the standard protocol during bolus administration of intravenous contrast.  Contrast: OMNIPAQUE IOHEXOL 300 MG/ML IJ SOLN, 40mL OMNIPAQUE IOHEXOL 300 MG/ML IJ SOLN  Comparison: None.  Findings: Atelectasis in the lung bases.  Probable coronary artery calcifications.  Diffuse low attenuation change throughout the liver consistent with fatty infiltration.  The gallbladder, spleen, adrenal glands, kidneys, pancreas, and retroperitoneal lymph nodes are unremarkable.  Mild calcification of the abdominal aorta without aneurysm.  The stomach is mildly distended without focal wall thickening.  The there is mild distension of mostly fluid-filled small bowel extending throughout the jejunum with decompression of the terminal ileum.  The transition zone is located in the  right lower quadrant.  No mass or obstructing lesion is demonstrated. Changes suggest an adhesion.  No free fluid or free air in the abdomen.  Prominent visceral adipose tissues.  Pelvis:  Prostate gland is not enlarged.  No free or loculated pelvic fluid collections.  No significant pelvic lymphadenopathy. No inflammatory changes involving the sigmoid colon.  The colon is mostly decompressed without wall thickening.  The appendix is normal.  Degenerative changes in the lumbar spine.  The  IMPRESSION: Small bowel obstruction with transition zone in the right lower quadrant.  No obstructing mass lesion demonstrated.  Changes likely due to adhesion or focal stricture.  Diffuse fatty infiltration of the liver.  Original Report Authenticated By: Marlon Pel, M.D.       Assessment/Plan 1. SBO, unknown etiology 2. DM 3. HTN  Plan: The patient has an obvious bowel obstruction on his CT scan.  He has never had prior abdominal surgery, hernias, or any masses seen on CT scan.  For now, we would recommend continued conservative management.  I have increased his fluids to 125cc/hr to help with his pulse secondary to some dehydration likely secondary to NGT output, and n/v/d.  He does concern me some given how much tenderness he has and how much pain medicine he is requiring.  His WBC is currently normal and no evidence of compromised bowel is seen.  We will recheck x-rays in the morning to evaluate for any change.  If he does not improve with conservative management, he may require an abdominal operation.  I have d/w the patient and his wife.  They understand and agree with the current plan.  Thank you for this consult.  Patient had a "small bowel  obstruction" 27 years ago managed by approx. 4 days of NGT.  The cause was never identified and the patient has had no trouble in 27 years.  He had a colonoscopy by Dr. Tyler Aas in [facility].  The wife has asked that if a GI consult is need here to get Dr. Ewing Schlein.   I talked to the wife on the phone and outlined our plan.  He is better, at this time, with the NGT in place.  DN  OSBORNE,KELLY E 06/14/2011, 8:16 AM  Ovidio Kin, MD, [facility] Surgery Pager: [phone] Office phone:  [phone]

## 2011-06-14 NOTE — H&P (Addendum)
DATE OF ADMISSION:  06/14/2011  PCP:    Gildardo Griffes, MD, MD   Chief Complaint: ABD pain Nausea and Vomiting    HPI: Michael Cunningham is an 66 y.o. male who presents to the ED with complaints of ABD Pain Nausae and vomiting and diarrhea since 5pm the day before.  He reports having generalized ABD Pain, and denies having any hematemsis, hematiochezia, or melena passage.  He was unable to hold down food or liquids, and had been unable to take his medications.  In the ED a CT scan was performed which revealed a Small Bowel Obstruction.  An NGTube was placed and his case was discussed with General Surgery On Call and he was referred for medical admission.    Past Medical History  Diagnosis Date  . Diabetes mellitus   . Hypertension     Past Surgical History  Procedure Date  . Joint replacement     Medications:  HOME MEDS: Prior to Admission medications   Medication Sig Start Date End Date Taking? Authorizing Provider  acetaminophen (TYLENOL) 500 MG tablet Take 1,000 mg by mouth every 6 (six) hours as needed. For pain   Yes Historical Provider, MD  aspirin EC 81 MG tablet Take 81 mg by mouth daily.   Yes Historical Provider, MD  glipiZIDE (GLUCOTROL XL) 10 MG 24 hr tablet Take 10 mg by mouth 2 (two) times daily.   Yes Historical Provider, MD  insulin detemir (LEVEMIR) 100 UNIT/ML injection Inject 90 Units into the skin at bedtime.   Yes Historical Provider, MD  losartan (COZAAR) 50 MG tablet Take 50 mg by mouth daily.   Yes Historical Provider, MD  Multiple Vitamins-Minerals (CENTRUM SILVER ULTRA MENS PO) Take 1 tablet by mouth daily.   Yes Historical Provider, MD  simvastatin (ZOCOR) 20 MG tablet Take 20 mg by mouth every evening.   Yes Historical Provider, MD    Allergies:  Allergies  Allergen Reactions  . Ciprofloxacin Other (See Comments)    Abdominal pain  . Lipitor (Atorvastatin Calcium) Other (See Comments)    Severe muscle pain  . Lisinopril Cough  . Metformin And Related  Other (See Comments)    diarrhea    Social History:   reports that he quit smoking about 44 years ago. His smoking use included Cigarettes. He has never used smokeless tobacco. He reports that he does not drink alcohol or use illicit drugs.  Family History: No family history on file.  Review of Systems:  The patient denies anorexia, fever, weight loss,, vision loss, decreased hearing, hoarseness, chest pain, syncope, dyspnea on exertion, peripheral edema, balance deficits, hemoptysis, abdominal pain, melena, hematochezia, severe indigestion/heartburn, hematuria, incontinence, genital sores, muscle weakness, suspicious skin lesions, transient blindness, difficulty walking, depression, unusual weight change, abnormal bleeding, enlarged lymph nodes, angioedema, and breast masses.   Physical Exam:  GEN:  Pleasant 67 year old Obese [place] male examined  and in no acute distress; cooperative with exam Filed Vitals:   06/13/11 2230 06/13/11 2330 06/13/11 2340 06/14/11 0045  BP: 150/81 166/83 166/83 136/77  Pulse: 109 111 103 113  Temp:   98.4 F (36.9 C)   TempSrc:   Oral   Resp:   20   SpO2: 95% 97% 97% 91%   Blood pressure 136/77, pulse 113, temperature 98.4 F (36.9 C), temperature source Oral, resp. rate 20, SpO2 91.00%. PSYCH: He is alert and oriented x4; does not appear anxious does not appear depressed; affect is normal HEENT: Normocephalic and Atraumatic, Mucous membranes  pink; PERRLA; EOM intact; Fundi:  Benign;  No scleral icterus, Nares: Patent, Oropharynx: Clear,Fair Dentition, Neck:  FROM, no cervical lymphadenopathy nor thyromegaly or carotid bruit; no JVD; Breasts:: Not examined CHEST WALL: No tenderness CHEST: Normal respiration, clear to auscultation bilaterally HEART: Regular rate and rhythm; no murmurs rubs or gallops BACK: No kyphosis or scoliosis; no CVA tenderness ABDOMEN: Decreased Bowel Sounds, Obese tympanic in all 4quadrants, soft non-tender; no masses, no  organomegaly. Rectal Exam: Not done EXTREMITIES: No bone or joint deformity; age-appropriate arthropathy of the hands and knees; no cyanosis, clubbing or edema; no ulcerations. Genitalia: not examined PULSES: 2+ and symmetric SKIN: Normal hydration no rash or ulceration CNS: Cranial nerves 2-12 grossly intact no focal neurologic deficit   Labs & Imaging Results for orders placed during the hospital encounter of 06/13/11 (from the past 48 hour(s))  CBC     Status: Abnormal   Collection Time   06/13/11  9:15 PM      Component Value Range Comment   WBC 7.8  4.0 - 10.5 (K/uL)    RBC 5.45  4.22 - 5.81 (MIL/uL)    Hemoglobin 17.7 (*) 13.0 - 17.0 (g/dL)    HCT 16.1  09.6 - 04.5 (%)    MCV 88.8  78.0 - 100.0 (fL)    MCH 32.5  26.0 - 34.0 (pg)    MCHC 36.6 (*) 30.0 - 36.0 (g/dL)    RDW 40.9  81.1 - 91.4 (%)    Platelets 184  150 - 400 (K/uL)   DIFFERENTIAL     Status: Abnormal   Collection Time   06/13/11  9:15 PM      Component Value Range Comment   Neutrophils Relative 86 (*) 43 - 77 (%)    Neutro Abs 6.7  1.7 - 7.7 (K/uL)    Lymphocytes Relative 9 (*) 12 - 46 (%)    Lymphs Abs 0.7  0.7 - 4.0 (K/uL)    Monocytes Relative 5  3 - 12 (%)    Monocytes Absolute 0.4  0.1 - 1.0 (K/uL)    Eosinophils Relative 0  0 - 5 (%)    Eosinophils Absolute 0.0  0.0 - 0.7 (K/uL)    Basophils Relative 0  0 - 1 (%)    Basophils Absolute 0.0  0.0 - 0.1 (K/uL)   COMPREHENSIVE METABOLIC PANEL     Status: Abnormal   Collection Time   06/13/11  9:15 PM      Component Value Range Comment   Sodium 133 (*) 135 - 145 (mEq/L)    Potassium 4.6  3.5 - 5.1 (mEq/L) HEMOLYSIS AT THIS LEVEL MAY AFFECT RESULT   Chloride 95 (*) 96 - 112 (mEq/L)    CO2 22  19 - 32 (mEq/L)    Glucose, Bld 301 (*) 70 - 99 (mg/dL)    BUN 21  6 - 23 (mg/dL)    Creatinine, Ser 7.82  0.50 - 1.35 (mg/dL)    Calcium 9.0  8.4 - 10.5 (mg/dL)    Total Protein 7.6  6.0 - 8.3 (g/dL)    Albumin 3.9  3.5 - 5.2 (g/dL)    AST 39 (*) 0 - 37 (U/L)  HEMOLYSIS AT THIS LEVEL MAY AFFECT RESULT   ALT 35  0 - 53 (U/L)    Alkaline Phosphatase 88  39 - 117 (U/L)    Total Bilirubin 1.1  0.3 - 1.2 (mg/dL)    GFR calc non Af Amer >90  >90 (mL/min)  GFR calc Af Amer >90  >90 (mL/min)   LIPASE, BLOOD     Status: Abnormal   Collection Time   06/13/11  9:15 PM      Component Value Range Comment   Lipase 9 (*) 11 - 59 (U/L)   GLUCOSE, CAPILLARY     Status: Abnormal   Collection Time   06/13/11  9:37 PM      Component Value Range Comment   Glucose-Capillary 321 (*) 70 - 99 (mg/dL)   GLUCOSE, CAPILLARY     Status: Abnormal   Collection Time   06/14/11 12:58 AM      Component Value Range Comment   Glucose-Capillary 308 (*) 70 - 99 (mg/dL)   URINALYSIS, ROUTINE W REFLEX MICROSCOPIC     Status: Abnormal   Collection Time   06/14/11  2:16 AM      Component Value Range Comment   Color, Urine YELLOW  YELLOW     APPearance CLEAR  CLEAR     Specific Gravity, Urine 1.010  1.005 - 1.030     pH 5.0  5.0 - 8.0     Glucose, UA 500 (*) NEGATIVE (mg/dL)    Hgb urine dipstick NEGATIVE  NEGATIVE     Bilirubin Urine NEGATIVE  NEGATIVE     Ketones, ur 15 (*) NEGATIVE (mg/dL)    Protein, ur NEGATIVE  NEGATIVE (mg/dL)    Urobilinogen, UA 0.2  0.0 - 1.0 (mg/dL)    Nitrite NEGATIVE  NEGATIVE     Leukocytes, UA NEGATIVE  NEGATIVE  MICROSCOPIC NOT DONE ON URINES WITH NEGATIVE PROTEIN, BLOOD, LEUKOCYTES, NITRITE, OR GLUCOSE <1000 mg/dL.   Ct Abdomen Pelvis W Contrast  06/13/2011  *RADIOLOGY REPORT*  Clinical Data: Bilateral mid and lower abdominal pain for 24 hours. Nausea and vomiting.  CT ABDOMEN AND PELVIS WITH CONTRAST  Technique:  Multidetector CT imaging of the abdomen and pelvis was performed following the standard protocol during bolus administration of intravenous contrast.  Contrast: OMNIPAQUE IOHEXOL 300 MG/ML IJ SOLN, 40mL OMNIPAQUE IOHEXOL 300 MG/ML IJ SOLN  Comparison: None.  Findings: Atelectasis in the lung bases.  Probable coronary artery  calcifications.  Diffuse low attenuation change throughout the liver consistent with fatty infiltration.  The gallbladder, spleen, adrenal glands, kidneys, pancreas, and retroperitoneal lymph nodes are unremarkable.  Mild calcification of the abdominal aorta without aneurysm.  The stomach is mildly distended without focal wall thickening.  The there is mild distension of mostly fluid-filled small bowel extending throughout the jejunum with decompression of the terminal ileum.  The transition zone is located in the right lower quadrant.  No mass or obstructing lesion is demonstrated. Changes suggest an adhesion.  No free fluid or free air in the abdomen.  Prominent visceral adipose tissues.  Pelvis:  Prostate gland is not enlarged.  No free or loculated pelvic fluid collections.  No significant pelvic lymphadenopathy. No inflammatory changes involving the sigmoid colon.  The colon is mostly decompressed without wall thickening.  The appendix is normal.  Degenerative changes in the lumbar spine.  The  IMPRESSION: Small bowel obstruction with transition zone in the right lower quadrant.  No obstructing mass lesion demonstrated.  Changes likely due to adhesion or focal stricture.  Diffuse fatty infiltration of the liver.  Original Report Authenticated By: Marlon Pel, M.D.      Assessment: Present on Admission:  .Abdominal pain, generalized .Nausea & vomiting .Diarrhea .Diabetes mellitus    Plan:  Admit to Med Surg Bed NPO and Supportive  Care NGT placed to LIMS IVFs for maintenance Rx Anti-emetics PRN Pain Control PRN Reconcile Meds, Half dose Insulin while NPO.   SSI coverage PRN.   DVT prophylaxis Other plans as per orders.   General Surgery Consult if Needed    CODE STATUS:      FULL CODE         Dewanna Hurston C 06/14/2011, 2:50 AM

## 2011-06-14 NOTE — Progress Notes (Signed)
Utilization Review Completed.Michael Cunningham T4/03/2011   

## 2011-06-14 NOTE — Progress Notes (Signed)
Patient ID: Michael Cunningham, male   DOB: December 26, 1945, 66 y.o.   MRN: [id number]  Consults:  General Surgery  Subjective: Patient reports diarrhea and vomiting prior to admit.  No diarrhea since admit.  In 1986 he had a viral induced SBO.  He has not had an SBO since.  He's never had abdominal surgery.  Objective: Weight change:   Intake/Output Summary (Last 24 hours) at 06/14/11 0733 Last data filed at 06/14/11 0400  Gross per 24 hour  Intake 266.67 ml  Output    500 ml  Net -233.33 ml   Blood pressure 149/82, pulse 107, temperature 98 F (36.7 C), temperature source Oral, resp. rate 18, SpO2 93.00%. Filed Vitals:   06/13/11 2340 06/14/11 0045 06/14/11 0130 06/14/11 0352  BP:  136/77 152/89 149/82  Pulse:  113 109 107  Temp: 98.4 F (36.9 C)  98.3 F (36.8 C) 98 F (36.7 C)  TempSrc: Oral  Oral Oral  Resp: 20  18 18   SpO2:  91% 95% 93%    Physical Exam: General: No acute distress, face slightly pink.  Obese Lungs: Clear to auscultation bilaterally without wheezes or crackles Cardiovascular: Regular rate and rhythm without murmur gallop or rub normal S1 and S2 Abdomen: Obese, distended but not tight.  No bowel sounds in lower quadrants.  High pitched typmpanic BS in upper most left quadrant,  no appreciable mass Extremities: No significant cyanosis, clubbing, or edema bilateral lower extremities  Basic Metabolic Panel:  Lab 06/13/11 7846  NA 133*  K 4.6  CL 95*  CO2 22  GLUCOSE 301*  BUN 21  CREATININE 0.63  CALCIUM 9.0  MG --  PHOS --   Liver Function Tests:  Lab 06/13/11 2115  AST 39*  ALT 35  ALKPHOS 88  BILITOT 1.1  PROT 7.6  ALBUMIN 3.9    Lab 06/13/11 2115  LIPASE 9*  AMYLASE --   No results found for this basename: AMMONIA:2 in the last 168 hours CBC:  Lab 06/14/11 0635 06/13/11 2115  WBC 6.6 7.8  NEUTROABS -- 6.7  HGB 15.5 17.7*  HCT 43.4 48.4  MCV 88.9 88.8  PLT 142* 184  CBG:  Lab 06/14/11 0345 06/14/11 0058 06/13/11 2137  GLUCAP  259* 308* 321*    Studies/Results: Scheduled Meds:   . antiseptic oral rinse  15 mL Mouth Rinse BID  . enoxaparin  40 mg Subcutaneous Q24H  .  HYDROmorphone (DILAUDID) injection  1 mg Intravenous Once  . insulin aspart  0-9 Units Subcutaneous Q4H  . insulin detemir  40 Units Subcutaneous QHS  .  morphine injection  4 mg Intravenous Once  . ondansetron  4 mg Intravenous Once  . sodium chloride  1,000 mL Intravenous Once  . sodium chloride  1,000 mL Intravenous Once   Continuous Infusions:   . sodium chloride 100 mL/hr at 06/14/11 0120   PRN Meds:.acetaminophen, acetaminophen, chlorproMAZINE, HYDROmorphone, iohexol, iohexol, ondansetron (ZOFRAN) IV, ondansetron  Anti-infectives:  Anti-infectives    None      Assessment/Plan: Principal Problem:  Small Bowel Obstruction.  N/G tube placed.  Patient is NPO.  Surgery is on board.  Repeat Abd film ordered for 4/2.    Vomiting and diarrhea.  Resolved.  Likely gastroenteritis.  Diabetes Mellitus.  Cbgs running in the 200 - 300s.  Patient NPO. Will increase sliding scale to moderate and check cbgs q 4 hours. Will decrease Levemir to 15 units while NPO and monitor CBG's closely.Resume Glipizide when oral intake resumes  HTN -BP moderately controlled off antihypertensives, will add as needed IV Hydralazine, and monitor BP trend. If uncontrolled HTN develops,can add IV Enalapril or lopressor-scheduled.  Dyslipidemia -Statins on hold, resume when SBO resolved.  DVT Prophylaxis.   Lovenox  Code Status -Full Code  Disposition -remain inpatient  DVT Prophylaxis.  Lovenox   LOS: 1 day    Romualdo Bolk [facility] Pager: [phone]  06/14/2011, 7:33 AM  Attending  I have seen and examined the patient, I have reviewed and edited the above documentation. I agree with the assessment and plan as outlined above. Appreciate CCS input.  Dr Windell Norfolk MD

## 2011-06-14 NOTE — ED Notes (Signed)
Pt attempted to urinate unable to at this time  

## 2011-06-14 NOTE — Plan of Care (Signed)
Problem: Phase II Progression Outcomes Goal: Progress activity as tolerated unless otherwise ordered Outcome: Completed/Met Date Met:  06/14/11 Ambulated in halls

## 2011-06-14 NOTE — Progress Notes (Signed)
Inpatient Diabetes Program Recommendations  AACE/ADA: New Consensus Statement on Inpatient Glycemic Control (2009)  Target Ranges:  Prepandial:   less than 140 mg/dL      Peak postprandial:   less than 180 mg/dL (1-2 hours)      Critically ill patients:  140 - 180 mg/dL   Reason for Visit: Hyperglycemia while NPO  Inpatient Diabetes Program Recommendations Insulin - Basal: Pt takes 90 units Levemir at home.  Please increase to to at least 60 to 90 units while here.  This is basal insulin, and basal needs are high presently. Correction (SSI): Please increase to moderate until glucose is controlled, esp while not getting normal full dose of Levemir.  Note: Thank you, Lenor Coffin, RN, CNS, Diabetes Coordinator [phone])

## 2011-06-14 NOTE — ED Notes (Signed)
PT attempted to provide urine. Unsuccessful

## 2011-06-15 ENCOUNTER — Inpatient Hospital Stay (HOSPITAL_COMMUNITY): Payer: Medicare Other

## 2011-06-15 LAB — GLUCOSE, CAPILLARY
Glucose-Capillary: 141 mg/dL — ABNORMAL HIGH (ref 70–99)
Glucose-Capillary: 167 mg/dL — ABNORMAL HIGH (ref 70–99)
Glucose-Capillary: 176 mg/dL — ABNORMAL HIGH (ref 70–99)
Glucose-Capillary: 180 mg/dL — ABNORMAL HIGH (ref 70–99)

## 2011-06-15 LAB — BASIC METABOLIC PANEL
BUN: 16 mg/dL (ref 6–23)
Calcium: 8.1 mg/dL — ABNORMAL LOW (ref 8.4–10.5)
Creatinine, Ser: 0.62 mg/dL (ref 0.50–1.35)
GFR calc Af Amer: >90 mL/min (ref >90)
GFR calc non Af Amer: >90 mL/min (ref >90)

## 2011-06-15 MED ORDER — METOPROLOL TARTRATE 1 MG/ML IV SOLN
2.5000 mg | Freq: Four times a day (QID) | INTRAVENOUS | Status: DC
  Administered 2011-06-15 – 2011-06-17 (×8): 2.5 mg via INTRAVENOUS
  Filled 2011-06-15 (×11): qty 5

## 2011-06-15 MED ORDER — ENOXAPARIN SODIUM 40 MG/0.4ML ~~LOC~~ SOLN
40.0000 mg | Freq: Once | SUBCUTANEOUS | Status: AC
  Administered 2011-06-15: 40 mg via SUBCUTANEOUS
  Filled 2011-06-15: qty 0.4

## 2011-06-15 MED ORDER — INSULIN DETEMIR 100 UNIT/ML ~~LOC~~ SOLN
18.0000 [IU] | Freq: Every day | SUBCUTANEOUS | Status: DC
  Administered 2011-06-15: 18 [IU] via SUBCUTANEOUS

## 2011-06-15 MED ORDER — HYDROMORPHONE HCL PF 1 MG/ML IJ SOLN
1.0000 mg | INTRAMUSCULAR | Status: DC | PRN
  Administered 2011-06-15 (×3): 1 mg via INTRAVENOUS
  Filled 2011-06-15 (×3): qty 1

## 2011-06-15 MED ORDER — ENOXAPARIN SODIUM 40 MG/0.4ML ~~LOC~~ SOLN
40.0000 mg | SUBCUTANEOUS | Status: DC
  Filled 2011-06-15: qty 0.4

## 2011-06-15 MED ORDER — SODIUM CHLORIDE 0.45 % IV SOLN
INTRAVENOUS | Status: DC
  Administered 2011-06-15 – 2011-06-18 (×6): via INTRAVENOUS
  Filled 2011-06-15 (×11): qty 1000

## 2011-06-15 MED ORDER — ENOXAPARIN SODIUM 40 MG/0.4ML ~~LOC~~ SOLN
40.0000 mg | SUBCUTANEOUS | Status: DC
  Administered 2011-06-16 – 2011-06-17 (×2): 40 mg via SUBCUTANEOUS
  Filled 2011-06-15 (×3): qty 0.4

## 2011-06-15 MED ORDER — KCL IN DEXTROSE-NACL 20-5-0.45 MEQ/L-%-% IV SOLN
INTRAVENOUS | Status: DC
  Filled 2011-06-15 (×2): qty 1000

## 2011-06-15 NOTE — Progress Notes (Signed)
Patient ID: Michael Cunningham, male   DOB: Dec 23, 1945, 66 y.o.   MRN: [id number]  Consults:  General Surgery  Subjective: Complaining of upper abdominal pain.  Concerned about N/G suction.  Objective: Weight change:   Intake/Output Summary (Last 24 hours) at 06/15/11 1445 Last data filed at 06/15/11 1140  Gross per 24 hour  Intake     30 ml  Output   3500 ml  Net  -3470 ml   Blood pressure 169/91, pulse 85, temperature 98.6 F (37 C), temperature source Oral, resp. rate 17, height 5\' 10"  (1.778 m), weight 105 kg (231 lb 7.7 oz), SpO2 93.00%. Filed Vitals:   06/15/11 0255 06/15/11 0608 06/15/11 0619 06/15/11 1426  BP: 157/92 148/89 133/76 169/91  Pulse: 87 83 93 85  Temp: 98.6 F (37 C) 98.3 F (36.8 C) 98.1 F (36.7 C) 98.6 F (37 C)  TempSrc: Oral Oral Oral Oral  Resp: 17 17 17 17   Height:      Weight:      SpO2: 93% 91% 98% 93%    Physical Exam: General: No acute distress, face slightly pink.  Obese Lungs: Clear to auscultation bilaterally without wheezes or crackles Cardiovascular: Regular rate and rhythm without murmur gallop or rub normal S1 and S2 Abdomen: Obese, distended but not tight.  No bowel sounds in lower quadrants.  High pitched typmpanic BS in upper most left quadrant,  no appreciable mass Extremities: No significant cyanosis, clubbing, or edema bilateral lower extremities  Basic Metabolic Panel:  Lab 06/15/11 9562 06/14/11 0635  NA 139 137  K 3.6 4.0  CL 99 102  CO2 30 27  GLUCOSE 192* 229*  BUN 16 18  CREATININE 0.62 0.68  CALCIUM 8.1* 8.2*  MG -- --  PHOS -- --   Liver Function Tests:  Lab 06/13/11 2115  AST 39*  ALT 35  ALKPHOS 88  BILITOT 1.1  PROT 7.6  ALBUMIN 3.9    Lab 06/13/11 2115  LIPASE 9*  AMYLASE --   CBC:  Lab 06/14/11 0635 06/13/11 2115  WBC 6.6 7.8  NEUTROABS -- 6.7  HGB 15.5 17.7*  HCT 43.4 48.4  MCV 88.9 88.8  PLT 142* 184  CBG:  Lab 06/15/11 0747 06/15/11 0607 06/15/11 0057 06/14/11 1946 06/14/11 1619  06/14/11 1125  GLUCAP 180* 178* 176* 183* 213* 204*    Studies/Results: Scheduled Meds:    . antiseptic oral rinse  15 mL Mouth Rinse BID  . enoxaparin  40 mg Subcutaneous Q24H  . enoxaparin (LOVENOX) injection  40 mg Subcutaneous Once  . insulin aspart  0-15 Units Subcutaneous Q4H  . insulin detemir  18 Units Subcutaneous QHS  . DISCONTD: enoxaparin  40 mg Subcutaneous Q24H  . DISCONTD: enoxaparin  40 mg Subcutaneous Q24H  . DISCONTD: insulin detemir  15 Units Subcutaneous QHS  . DISCONTD: insulin detemir  40 Units Subcutaneous QHS   Continuous Infusions:    . sodium chloride 0.45 % 1,000 mL with potassium chloride 20 mEq infusion 125 mL/hr at 06/15/11 1101  . DISCONTD: sodium chloride 1,000 mL (06/14/11 1913)  . DISCONTD: dextrose 5 % and 0.45 % NaCl with KCl 20 mEq/L     PRN Meds:.acetaminophen, acetaminophen, chlorproMAZINE, HYDROmorphone, ondansetron (ZOFRAN) IV, ondansetron, phenol, DISCONTD: HYDROmorphone  Anti-infectives:  Anti-infectives    None      Assessment/Plan: Principal Problem:  Small Bowel Obstruction.  N/G tube placed.  Patient is NPO.  Surgery is on board.  Repeat Abd film ordered for 4/3.  Vomiting and diarrhea.  Resolved.  Likely gastroenteritis.  Diabetes Mellitus.  Cbgs running in the 170s - 200.  Patient NPO. Will increase sliding scale to moderate and check cbgs q 4 hours. Levemir dose at 18 units while NPO and monitor CBG's closely. Resume Glipizide when oral intake resumes  HTN -starting Lopressor 2.5 q 6 with hold parameters, already on PRN IV Hydralazine  Dyslipidemia -Statins on hold, resume when SBO resolved.  DVT Prophylaxis.   Lovenox  Code Status -Full Code  Disposition -remain inpatient  LOS: 2 days    Romualdo Bolk [facility] Pager: [phone]  06/15/2011, 2:45 PM   Attending -patient seen and examined, agree with assessment and plan as outlined by Ms [place].Still with abd pain but Abd Xray looks  slightly better, CCS following and wish to give the patient another 24 hours before proceeding with laparotomy. In interim CBG's stable with levemir 18 units and SSI. Agree with starting low dose lopressor to better control BP  Windell Norfolk MD

## 2011-06-15 NOTE — Progress Notes (Signed)
   CARE MANAGEMENT NOTE 06/15/2011  Patient:  Michael Cunningham, Michael Cunningham   Account Number:  [id number]  Date Initiated:  06/14/2011  Documentation initiated by:  Donn Pierini  Subjective/Objective Assessment:   Pt admitted with Small bowel obstruction     Action/Plan:   PTA pt lived at home with spouse, was independent with ADLs- pt has NGT   Anticipated DC Date:  06/16/2011   Anticipated DC Plan:  HOME/SELF CARE      DC Planning Services  CM consult      Choice offered to / List presented to:             Status of service:  In process, will continue to follow Medicare Important Message given?   (If response is "NO", the following Medicare IM given date fields will be blank) Date Medicare IM given:   Date Additional Medicare IM given:    Discharge Disposition:    Per UR Regulation:    If discussed at Long Length of Stay Meetings, dates discussed:    Comments:  PCP- Gildardo Griffes  06/15/11- 1515- Donn Pierini RN, BSN [phone] Spoke with pt at bedside- per conversation pt lives at home with wife and uses cane to assist with ambulation secondary to knee issues. Pt states that he has medication benefits part D, and uses [facility]. Pt still with NGT and surgery following for potential need for surgery. CM to follow. Plan to return home when medically stable.

## 2011-06-15 NOTE — Progress Notes (Signed)
Patient ID: Michael Cunningham, male   DOB: 03-11-1946, 66 y.o.   MRN: [id number]    Subjective: Pt still having quite a bit of upper abdominal pain.  Quite severe at times.  Had 1 episode of flatus, but none otherwise. NGT became disconnected - there are gastric contents over the bed.  Objective: Vital signs in last 24 hours: Temp:  [98.1 F (36.7 C)-98.7 F (37.1 C)] 98.1 F (36.7 C) (04/02 0619) Pulse Rate:  [83-105] 93  (04/02 0619) Resp:  [17-18] 17  (04/02 0619) BP: (133-163)/(76-92) 133/76 mmHg (04/02 0619) SpO2:  [91 %-98 %] 98 % (04/02 0619) Weight:  [231 lb 7.7 oz (105 kg)] 231 lb 7.7 oz (105 kg) (04/01 1950) Last BM Date: 06/13/11  Intake/Output from previous day: 04/01 0701 - 04/02 0700 In: -  Out: 3875 [Urine:1300; Emesis/NG output:2575] Intake/Output this shift:    PE: Abd: softer, less distended, but still with some distention.  Some BS, quite tender in upper abdominal region Heart: regular Lungs: CTAB  Lab Results:   Basename 06/14/11 0635 06/13/11 2115  WBC 6.6 7.8  HGB 15.5 17.7*  HCT 43.4 48.4  PLT 142* 184   BMET  Basename 06/15/11 0536 06/14/11 0635  NA 139 137  K 3.6 4.0  CL 99 102  CO2 30 27  GLUCOSE 192* 229*  BUN 16 18  CREATININE 0.62 0.68  CALCIUM 8.1* 8.2*   PT/INR No results found for this basename: LABPROT:2,INR:2 in the last 72 hours CMP     Component Value Date/Time   NA 139 06/15/2011 0536   K 3.6 06/15/2011 0536   CL 99 06/15/2011 0536   CO2 30 06/15/2011 0536   GLUCOSE 192* 06/15/2011 0536   BUN 16 06/15/2011 0536   CREATININE 0.62 06/15/2011 0536   CALCIUM 8.1* 06/15/2011 0536   PROT 7.6 06/13/2011 2115   ALBUMIN 3.9 06/13/2011 2115   AST 39* 06/13/2011 2115   ALT 35 06/13/2011 2115   ALKPHOS 88 06/13/2011 2115   BILITOT 1.1 06/13/2011 2115   GFRNONAA >90 06/15/2011 0536   GFRAA >90 06/15/2011 0536   Lipase     Component Value Date/Time   LIPASE 9* 06/13/2011 2115       Studies/Results: Ct Abdomen Pelvis W Contrast  06/13/2011   *RADIOLOGY REPORT*  Clinical Data: Bilateral mid and lower abdominal pain for 24 hours. Nausea and vomiting.  CT ABDOMEN AND PELVIS WITH CONTRAST  Technique:  Multidetector CT imaging of the abdomen and pelvis was performed following the standard protocol during bolus administration of intravenous contrast.  Contrast: OMNIPAQUE IOHEXOL 300 MG/ML IJ SOLN, 40mL OMNIPAQUE IOHEXOL 300 MG/ML IJ SOLN  Comparison: None.  Findings: Atelectasis in the lung bases.  Probable coronary artery calcifications.  Diffuse low attenuation change throughout the liver consistent with fatty infiltration.  The gallbladder, spleen, adrenal glands, kidneys, pancreas, and retroperitoneal lymph nodes are unremarkable.  Mild calcification of the abdominal aorta without aneurysm.  The stomach is mildly distended without focal wall thickening.  The there is mild distension of mostly fluid-filled small bowel extending throughout the jejunum with decompression of the terminal ileum.  The transition zone is located in the right lower quadrant.  No mass or obstructing lesion is demonstrated. Changes suggest an adhesion.  No free fluid or free air in the abdomen.  Prominent visceral adipose tissues.  Pelvis:  Prostate gland is not enlarged.  No free or loculated pelvic fluid collections.  No significant pelvic lymphadenopathy. No inflammatory changes involving the  sigmoid colon.  The colon is mostly decompressed without wall thickening.  The appendix is normal.  Degenerative changes in the lumbar spine.  The  IMPRESSION: Small bowel obstruction with transition zone in the right lower quadrant.  No obstructing mass lesion demonstrated.  Changes likely due to adhesion or focal stricture.  Diffuse fatty infiltration of the liver.  Original Report Authenticated By: Marlon Pel, M.D.    Anti-infectives: Anti-infectives    None       Assessment/Plan  1. SBO 2. HTN 3. DM   (Hgb A1C - 8.9 on admission)  Plan: 1. Await x-rays, if  no significant change then plan for OR today for SBO.  If x-rays show improvement will likely hold off and continue conservative management.   LOS: 2 days   KUB is better with contrast in the colon.  He still has air/fluid loops of small bowel.  The NGT is in good position.  He is having some epigastric pain.   His wife is at the bedside.  So he is better by x-ray, but does not feel better.  We will give him one more day - repeat his KUB in the AM.  Patient and wife understand.  OSBORNE,KELLY E 06/15/2011

## 2011-06-16 ENCOUNTER — Inpatient Hospital Stay (HOSPITAL_COMMUNITY): Payer: Medicare Other

## 2011-06-16 LAB — BASIC METABOLIC PANEL
BUN: 14 mg/dL (ref 6–23)
Chloride: 100 mEq/L (ref 96–112)
GFR calc Af Amer: >90 mL/min (ref >90)
Potassium: 3.5 mEq/L (ref 3.5–5.1)

## 2011-06-16 LAB — GLUCOSE, CAPILLARY
Glucose-Capillary: 123 mg/dL — ABNORMAL HIGH (ref 70–99)
Glucose-Capillary: 134 mg/dL — ABNORMAL HIGH (ref 70–99)

## 2011-06-16 LAB — CBC
Hemoglobin: 14.5 g/dL (ref 13.0–17.0)
MCHC: 34.2 g/dL (ref 30.0–36.0)
RBC: 4.68 MIL/uL (ref 4.22–5.81)
WBC: 7.7 10*3/uL (ref 4.0–10.5)

## 2011-06-16 LAB — DIFFERENTIAL
Basophils Relative: 0 % (ref 0–1)
Monocytes Relative: 9 % (ref 3–12)
Neutro Abs: 5.4 10*3/uL (ref 1.7–7.7)
Neutrophils Relative %: 71 % (ref 43–77)

## 2011-06-16 MED ORDER — POTASSIUM CHLORIDE 10 MEQ/100ML IV SOLN
10.0000 meq | INTRAVENOUS | Status: AC
  Administered 2011-06-16 (×3): 10 meq via INTRAVENOUS
  Filled 2011-06-16 (×3): qty 100

## 2011-06-16 MED ORDER — INSULIN DETEMIR 100 UNIT/ML ~~LOC~~ SOLN
17.0000 [IU] | Freq: Every day | SUBCUTANEOUS | Status: DC
Start: 2011-06-16 — End: 2011-06-18
  Administered 2011-06-16 – 2011-06-17 (×2): 17 [IU] via SUBCUTANEOUS
  Filled 2011-06-16: qty 10

## 2011-06-16 MED ORDER — MENTHOL 3 MG MT LOZG
1.0000 | LOZENGE | OROMUCOSAL | Status: DC | PRN
  Filled 2011-06-16: qty 9

## 2011-06-16 NOTE — Progress Notes (Signed)
Patient ID: Michael Cunningham, male   DOB: [birth year], 66 y.o.   MRN: [id number]  Consults:  General Surgery  Subjective: Decreased epigastric pain.  Passing flatus  Objective: Weight change:   Intake/Output Summary (Last 24 hours) at 06/16/11 0746 Last data filed at 06/16/11 0700  Gross per 24 hour  Intake 2200.42 ml  Output   2775 ml  Net -574.58 ml   Blood pressure 157/83, pulse 75, temperature 97.7 F (36.5 C), temperature source Oral, resp. rate 17, height 5\' 10"  (1.778 m), weight 105 kg (231 lb 7.7 oz), SpO2 93.00%. Filed Vitals:   06/15/11 1426 06/15/11 2323 06/16/11 0150 06/16/11 0550  BP: 169/91 152/79 162/87 157/83  Pulse: 85 79 76 75  Temp: 98.6 F (37 C) 98 F (36.7 C) 97.8 F (36.6 C) 97.7 F (36.5 C)  TempSrc: Oral Oral Oral Oral  Resp: 17 18 17 17   Height:      Weight:      SpO2: 93% 95% 95% 93%    Physical Exam: General: No acute distress, face slightly pink.  Obese.  N/G tube out Lungs: Clear to auscultation bilaterally without wheezes or crackles Cardiovascular: Regular rate and rhythm without murmur gallop or rub normal S1 and S2 Abdomen: Obese, decompressed and soft.  No bowel sounds in lower quadrants.  BS in upper most left quadrant,  no appreciable mass Extremities: No significant cyanosis, clubbing, or edema bilateral lower extremities  Basic Metabolic Panel:  Lab 06/15/11 4098 06/14/11 0635  NA 139 137  K 3.6 4.0  CL 99 102  CO2 30 27  GLUCOSE 192* 229*  BUN 16 18  CREATININE 0.62 0.68  CALCIUM 8.1* 8.2*  MG -- --  PHOS -- --   Liver Function Tests:  Lab 06/13/11 2115  AST 39*  ALT 35  ALKPHOS 88  BILITOT 1.1  PROT 7.6  ALBUMIN 3.9    Lab 06/13/11 2115  LIPASE 9*  AMYLASE --   CBC:  Lab 06/16/11 0640 06/14/11 0635 06/13/11 2115  WBC 7.7 6.6 --  NEUTROABS 5.4 -- 6.7  HGB 14.5 15.5 --  HCT 42.4 43.4 --  MCV 90.6 88.9 --  PLT 167 142* --  CBG:  Lab 06/16/11 0358 06/16/11 0026 06/15/11 2019 06/15/11 1708 06/15/11 1130  06/15/11 0747  GLUCAP 102* 134* 141* 167* 186* 180*    Studies/Results: Scheduled Meds:    . antiseptic oral rinse  15 mL Mouth Rinse BID  . enoxaparin  40 mg Subcutaneous Q24H  . enoxaparin (LOVENOX) injection  40 mg Subcutaneous Once  . insulin aspart  0-15 Units Subcutaneous Q4H  . insulin detemir  18 Units Subcutaneous QHS  . metoprolol  2.5 mg Intravenous Q6H  . DISCONTD: enoxaparin  40 mg Subcutaneous Q24H  . DISCONTD: insulin detemir  15 Units Subcutaneous QHS   Continuous Infusions:    . sodium chloride 0.45 % 1,000 mL with potassium chloride 20 mEq infusion 100 mL/hr at 06/16/11 0619  . DISCONTD: sodium chloride 1,000 mL (06/14/11 1913)  . DISCONTD: dextrose 5 % and 0.45 % NaCl with KCl 20 mEq/L     PRN Meds:.acetaminophen, acetaminophen, HYDROmorphone, ondansetron (ZOFRAN) IV, ondansetron, phenol, DISCONTD: HYDROmorphone  Anti-infectives:  Anti-infectives    None      Assessment/Plan: Principal Problem:  Small Bowel Obstruction.  N/G tube out.  Patient is NPO.  Surgery is managing.  Repeat Abd film ordered for 4/4.   Per surgery patient is to remain NPO today.  If he does well he  will have clear liquids tomorrow.  Surgery will control his diet.   If he declines he will immediately go to the O.R.  Vomiting and diarrhea.  Resolved.  Likely gastroenteritis.  Diabetes Mellitus.  Cbgs controlled and stable.  Patient NPO.  Levemir dose at 17 units while NPO and monitor CBG's closely. Resume full dose levemir and Glipizide when oral intake resumes.  HTN  Lopressor 2.5 mg IV q 6 with hold parameters, already on PRN IV Hydralazine.  Resume home meds when able to take POs.  Dyslipidemia -Statins on hold, resume when SBO resolved.  DVT Prophylaxis.   Lovenox  Code Status -Full Code  Disposition -remain inpatient  LOS: 3 days    Algis Downs, [place] [facility] Pager: [phone]  06/16/2011, 7:46 AM

## 2011-06-16 NOTE — Progress Notes (Signed)
Patient ID: Michael Cunningham, male   DOB: 12/20/1945, 66 y.o.   MRN: [id number]    Subjective: Pt feeling better today.  Less pain.  A little more flatus.  Objective: Vital signs in last 24 hours: Temp:  [97.7 F (36.5 C)-98.6 F (37 C)] 97.7 F (36.5 C) (04/03 0550) Pulse Rate:  [75-85] 75  (04/03 0550) Resp:  [17-18] 17  (04/03 0550) BP: (152-169)/(79-91) 157/83 mmHg (04/03 0550) SpO2:  [93 %-95 %] 93 % (04/03 0550) Last BM Date: 06/14/11  Intake/Output from previous day: 07/12/2022 0701 - 04/03 0700 In: 2200.4 [I.V.:2020.4; NG/GT:180] Out: 2775 [Urine:475; Emesis/NG output:2300] Intake/Output this shift:    PE: Abd: soft, less tender in upper abdomen, few BS, NGT still with quite a bit of bilious output.  Lab Results:   Basename 06/16/11 0640 06/14/11 0635  WBC 7.7 6.6  HGB 14.5 15.5  HCT 42.4 43.4  PLT 167 142*   BMET  Basename 07/12/2011 0536 06/14/11 0635  NA 139 137  K 3.6 4.0  CL 99 102  CO2 30 27  GLUCOSE 192* 229*  BUN 16 18  CREATININE 0.62 0.68  CALCIUM 8.1* 8.2*   PT/INR No results found for this basename: LABPROT:2,INR:2 in the last 72 hours CMP     Component Value Date/Time   NA 139 07/12/2011 0536   K 3.6 07-12-2011 0536   CL 99 July 12, 2011 0536   CO2 30 July 12, 2011 0536   GLUCOSE 192* July 12, 2011 0536   BUN 16 07/12/11 0536   CREATININE 0.62 12-Jul-2011 0536   CALCIUM 8.1* 07-12-11 0536   PROT 7.6 06/13/2011 2115   ALBUMIN 3.9 06/13/2011 2115   AST 39* 06/13/2011 2115   ALT 35 06/13/2011 2115   ALKPHOS 88 06/13/2011 2115   BILITOT 1.1 06/13/2011 2115   GFRNONAA >90 2011-07-12 0536   GFRAA >90 07/12/2011 0536   Lipase     Component Value Date/Time   LIPASE 9* 06/13/2011 2115       Studies/Results: Dg Abd 2 Views  07-12-11  *RADIOLOGY REPORT*  Clinical Data: Pain and decreased bowel sounds  ABDOMEN - 2 VIEW  Comparison: CT scan dated June 13, 2011  Findings: There are several mildly dilated loops of small bowel within the mid upper abdomen which appear stable  to slightly improved compared with the scout from the prior CT scan.  The nasogastric tube tip overlies the gastric body level.  Contrast from recent CT scan is present within the colon.  No pneumoperitoneum.  IMPRESSION: Partial small bowel obstruction persists which appears stable to slightly improved compared with the recent CT scan.  Original Report Authenticated By: Brandon Melnick, M.D.    Anti-infectives: Anti-infectives    None       Assessment/Plan  1. PSBO  Plan: 1. Will await x-rays today.  If better then will hold off on surgery.  If his x-rays look worse then will plan for OR. 2. Patient overall looks better today.   LOS: 3 days   His KUB is clearly better.  His symptoms are better.  Will take out NGT and see how he does.  OSBORNE,KELLY E 06/16/2011  Ovidio Kin, MD, [facility] Pager: [phone] Office phone:  [phone]

## 2011-06-16 NOTE — Progress Notes (Signed)
NG tube removed per MD order

## 2011-06-16 NOTE — Progress Notes (Signed)
Addendum  Patient seen and examined, chart and data base reviewed.  I agree with the above assessment and plan  For full details please see Mrs. Algis Downs PA. Note.  SBO, general surgery managing. NG tube removed, patient passes flatus.  Clint Lipps Pager: [phone] 06/16/2011, 4:08 PM

## 2011-06-17 ENCOUNTER — Inpatient Hospital Stay (HOSPITAL_COMMUNITY): Payer: Medicare Other

## 2011-06-17 LAB — GLUCOSE, CAPILLARY
Glucose-Capillary: 104 mg/dL — ABNORMAL HIGH (ref 70–99)
Glucose-Capillary: 92 mg/dL (ref 70–99)
Glucose-Capillary: 95 mg/dL (ref 70–99)

## 2011-06-17 LAB — BASIC METABOLIC PANEL
BUN: 13 mg/dL (ref 6–23)
CO2: 25 mEq/L (ref 19–32)
Glucose, Bld: 89 mg/dL (ref 70–99)
Potassium: 3.7 mEq/L (ref 3.5–5.1)
Sodium: 136 mEq/L (ref 135–145)

## 2011-06-17 MED ORDER — LOSARTAN POTASSIUM 50 MG PO TABS
50.0000 mg | ORAL_TABLET | Freq: Every day | ORAL | Status: DC
  Administered 2011-06-17: 50 mg via ORAL
  Filled 2011-06-17 (×2): qty 1

## 2011-06-17 MED ORDER — POTASSIUM CHLORIDE CRYS ER 20 MEQ PO TBCR
40.0000 meq | EXTENDED_RELEASE_TABLET | Freq: Once | ORAL | Status: AC
  Administered 2011-06-17: 40 meq via ORAL
  Filled 2011-06-17: qty 2

## 2011-06-17 NOTE — Progress Notes (Signed)
Addendum  Patient seen and examined, chart and data base reviewed.  I agree with the above assessment and plan  For full details please see Cordelia Pen NP note. SBO, follow Gen surgery recommendations.  Clint Lipps Pager: [phone] 06/17/2011, 2:23 PM

## 2011-06-17 NOTE — Progress Notes (Signed)
   CARE MANAGEMENT NOTE 06/17/2011  Patient:  Michael Cunningham, Michael Cunningham   Account Number:  [id number]  Date Initiated:  06/14/2011  Documentation initiated by:  Donn Pierini  Subjective/Objective Assessment:   Pt admitted with Small bowel obstruction     Action/Plan:   PTA pt lived at home with spouse, was independent with ADLs- pt has NGT   Anticipated DC Date:  06/18/2011   Anticipated DC Plan:  HOME/SELF CARE      DC Planning Services  CM consult      Choice offered to / List presented to:             Status of service:  In process, will continue to follow Medicare Important Message given?   (If response is "NO", the following Medicare IM given date fields will be blank) Date Medicare IM given:   Date Additional Medicare IM given:    Discharge Disposition:    Per UR Regulation:    If discussed at Long Length of Stay Meetings, dates discussed:    Comments:  PCP- Gildardo Griffes  06/17/11 16:22 Letha Cape RN, BSN [phone] patient on clear liquid diet, will advance diet and plan for dc tomorrow.  06/15/11- 1515- Donn Pierini RN, BSN [phone] Spoke with pt at bedside- per conversation pt lives at home with wife and uses cane to assist with ambulation secondary to knee issues. Pt states that he has medication benefits part D, and uses [facility]. Pt still with NGT and surgery following for potential need for surgery. CM to follow. Plan to return home when medically stable.

## 2011-06-17 NOTE — Progress Notes (Signed)
Patient ID: Michael Cunningham, male   DOB: 07-Dec-1945, 66 y.o.   MRN: [id number]    Subjective: Pt feels great today.  No further pain.  Hungry.    Objective: Vital signs in last 24 hours: Temp:  [97.8 F (36.6 C)-98.5 F (36.9 C)] 97.8 F (36.6 C) (04/04 0440) Pulse Rate:  [68-73] 68  (04/04 0440) Resp:  [17-18] 17  (04/04 0440) BP: (146-166)/(84-94) 147/90 mmHg (04/04 0440) SpO2:  [92 %-96 %] 95 % (04/04 0440) Last BM Date: 06/13/11  Intake/Output from previous day: Jun 23, 2022 0701 - 04/04 0700 In: 2498.3 [I.V.:2368.3; NG/GT:30; IV Piggyback:100] Out: 600 [Urine:200; Emesis/NG output:400] Intake/Output this shift:    PE: Abd: soft, NT, ND, +BS Heart: regular Lungs: CTAB  Lab Results:   Basename 06-23-2011 0640  WBC 7.7  HGB 14.5  HCT 42.4  PLT 167   BMET  Basename 06/17/11 0532 2011-06-23 0640  NA 136 139  K 3.7 3.5  CL 100 100  CO2 25 30  GLUCOSE 89 92  BUN 13 14  CREATININE 0.65 0.67  CALCIUM 8.6 8.6   PT/INR No results found for this basename: LABPROT:2,INR:2 in the last 72 hours CMP     Component Value Date/Time   NA 136 06/17/2011 0532   K 3.7 06/17/2011 0532   CL 100 06/17/2011 0532   CO2 25 06/17/2011 0532   GLUCOSE 89 06/17/2011 0532   BUN 13 06/17/2011 0532   CREATININE 0.65 06/17/2011 0532   CALCIUM 8.6 06/17/2011 0532   PROT 7.6 06/13/2011 2115   ALBUMIN 3.9 06/13/2011 2115   AST 39* 06/13/2011 2115   ALT 35 06/13/2011 2115   ALKPHOS 88 06/13/2011 2115   BILITOT 1.1 06/13/2011 2115   GFRNONAA >90 06/17/2011 0532   GFRAA >90 06/17/2011 0532   Lipase     Component Value Date/Time   LIPASE 9* 06/13/2011 2115       Studies/Results: Dg Abd 2 Views  06/23/11  *RADIOLOGY REPORT*  Clinical Data: Small bowel obstruction.  Abdominal pain.  ABDOMEN - 2 VIEW  Comparison: 06/15/2011  Findings: NG tube remains present in the stomach.  No gaseous distention of small bowel currently.  Gas and oral contrast material noted within decompressed colon.  No free air organomegaly.   IMPRESSION: Paucity of gas throughout the small bowel currently.  No current evidence for small bowel obstruction.  Original Report Authenticated By: Cyndie Chime, M.D.   Dg Abd 2 Views  06/15/2011  *RADIOLOGY REPORT*  Clinical Data: Pain and decreased bowel sounds  ABDOMEN - 2 VIEW  Comparison: CT scan dated June 13, 2011  Findings: There are several mildly dilated loops of small bowel within the mid upper abdomen which appear stable to slightly improved compared with the scout from the prior CT scan.  The nasogastric tube tip overlies the gastric body level.  Contrast from recent CT scan is present within the colon.  No pneumoperitoneum.  IMPRESSION: Partial small bowel obstruction persists which appears stable to slightly improved compared with the recent CT scan.  Original Report Authenticated By: Brandon Melnick, M.D.    Anti-infectives: Anti-infectives    None     Assessment/Plan  1. SBO, resolving, unknown etiology 2. DM 3. HTN  Plan: 1. Will allow clears and then carb mod diet for lunch if tolerates clears.  Would keep today and make sure he can tolerate solid food prior to discharge. 2. He will need to see GI as an outpatient for further workup to see if  a cause can be determined for this SBO.  The patient did have another episode some 20 years ago as well.  He has a virgin abdomen.  The patient prefers to see Dr. Ewing Schlein. 3. Will follow.   LOS: 4 days   Patient is clearly better.  DN  OSBORNE,KELLY E 06/17/2011  Ovidio Kin, MD, [facility] Surgery Pager: [phone] Office phone:  [phone]

## 2011-06-17 NOTE — Progress Notes (Signed)
PATIENT DETAILS Name: Michael Cunningham Age: 66 y.o. Sex: male Date of Birth: 02/16/1946 Admit Date: 06/13/2011 ZOX:[id number], MD, MD POA:   CONSULTS: General surgery  Interim history:  No events overnight  Subjective: Anxious to eat. Denies any abdominal pain, n/v. +flatus, no BM  Objective: Vital signs in last 24 hours: Temp:  [97.8 F (36.6 C)-98.5 F (36.9 C)] 97.8 F (36.6 C) (04/04 0440) Pulse Rate:  [68-84] 84  (04/04 1245) Resp:  [17-18] 17  (04/04 0440) BP: (146-166)/(84-94) 151/93 mmHg (04/04 1245) SpO2:  [92 %-96 %] 95 % (04/04 0440) Weight change:  Last BM Date: 06/13/11  Intake/Output from previous day:  Intake/Output Summary (Last 24 hours) at 06/17/11 1324 Last data filed at 06/17/11 0858  Gross per 24 hour  Intake 2948.33 ml  Output    475 ml  Net 2473.33 ml     Physical Exam:  Gen:  Awake, alert in NAD Cardiovascular:  S1S2 RRR, no m/r/g Respiratory: CTAB, no w/r/c, no increased wob Gastrointestinal: abdomen obese, soft, NT/ND. BS+ Extremities: no c/c/e   Lab Results:  Lab 06/16/11 0640 06/14/11 0635 06/13/11 2115  HGB 14.5 15.5 17.7*  HCT 42.4 43.4 48.4  WBC 7.7 6.6 7.8  PLT 167 142* 184     Lab 06/17/11 0532 06/16/11 0640 06/15/11 0536 06/14/11 0635 06/13/11 2115  NA 136 139 139 137 133*  K 3.7 3.5 -- -- --  CL 100 100 99 102 95*  CO2 25 30 30 27 22   GLUCOSE 89 92 192* 229* 301*  BUN 13 14 16 18 21   CREATININE 0.65 0.67 0.62 0.68 0.63  CALCIUM 8.6 8.6 8.1* 8.2* 9.0  MG -- -- -- -- --  PHOS -- -- -- -- --    Studies/Results: Dg Abd 1 View  06/17/2011  *RADIOLOGY REPORT*  Clinical Data: Small bowel obstruction, follow-up  ABDOMEN - 1 VIEW  Comparison: Abdomen films of 04/03 and 06/15/2011.  Findings: A supine film of the abdomen shows no present small bowel obstruction.  Only a small amount of small bowel air is noted in the left upper quadrant which is not distended.  Some oral contrast material is noted within the nondistended  colon.  No opaque calculi are seen.  IMPRESSION: No present evidence of small bowel obstruction.  Original Report Authenticated By: Juline Patch, M.D.   Dg Abd 2 Views  06/16/2011  *RADIOLOGY REPORT*  Clinical Data: Small bowel obstruction.  Abdominal pain.  ABDOMEN - 2 VIEW  Comparison: 06/15/2011  Findings: NG tube remains present in the stomach.  No gaseous distention of small bowel currently.  Gas and oral contrast material noted within decompressed colon.  No free air organomegaly.  IMPRESSION: Paucity of gas throughout the small bowel currently.  No current evidence for small bowel obstruction.  Original Report Authenticated By: Cyndie Chime, M.D.    Medications: Scheduled Meds:   . antiseptic oral rinse  15 mL Mouth Rinse BID  . enoxaparin  40 mg Subcutaneous Q24H  . insulin aspart  0-15 Units Subcutaneous Q4H  . insulin detemir  17 Units Subcutaneous QHS  . metoprolol  2.5 mg Intravenous Q6H  . potassium chloride  10 mEq Intravenous Q1 Hr x 3   Continuous Infusions:   . sodium chloride 0.45 % 1,000 mL with potassium chloride 20 mEq infusion 100 mL/hr at 06/17/11 0531   PRN Meds:.acetaminophen, acetaminophen, HYDROmorphone, menthol-cetylpyridinium, ondansetron (ZOFRAN) IV, ondansetron, phenol Antibiotics: Anti-infectives    None  Assessment/Plan:  1. SBO: continued improvement. NG tube removed 4/3 with no recurrent n/v or abdominal pain. KUB better on 4/4. Diet advanced per surgery. If continued improvement, likely d/c tomorrow.   2. DM2: cbg's currently stable 90's, 120's. Monitor with advancing diet. Continue Levemir, SSI.  3. HTN: will resume home meds as pt is tolerating po's  4. Dyslipidemia: on statin pta  5. DVT proph: on SQ lovenox  6. Dispo: if tolerated advanced diet, d/c home 4/5.  Cordelia Pen, NP-C [facility]  pgr [phone]    LOS: 4 days    06/17/2011, 1:24 PM

## 2011-06-18 ENCOUNTER — Encounter: Payer: Self-pay | Admitting: Physician Assistant

## 2011-06-18 DIAGNOSIS — I10 Essential (primary) hypertension: Secondary | ICD-10-CM

## 2011-06-18 LAB — BASIC METABOLIC PANEL
BUN: 11 mg/dL (ref 6–23)
CO2: 27 mEq/L (ref 19–32)
Chloride: 102 mEq/L (ref 96–112)
Creatinine, Ser: 0.82 mg/dL (ref 0.50–1.35)
Glucose, Bld: 138 mg/dL — ABNORMAL HIGH (ref 70–99)
Potassium: 4 mEq/L (ref 3.5–5.1)

## 2011-06-18 LAB — GLUCOSE, CAPILLARY
Glucose-Capillary: 115 mg/dL — ABNORMAL HIGH (ref 70–99)
Glucose-Capillary: 133 mg/dL — ABNORMAL HIGH (ref 70–99)
Glucose-Capillary: 168 mg/dL — ABNORMAL HIGH (ref 70–99)

## 2011-06-18 MED ORDER — INSULIN DETEMIR 100 UNIT/ML ~~LOC~~ SOLN: 90.0000 [IU] | Freq: Every day | SUBCUTANEOUS | Status: DC

## 2011-06-18 NOTE — Discharge Summary (Signed)
Addendum  Patient seen and examined, chart and data base reviewed.  I agree with the above assessment and plan  For full details please see Mrs. Algis Downs PA. Note.  Small bowel obstruction resolved with conservative management, patient followup with gastroenterology for colonoscopy and consideration for possible capsule enteroscopy.  Clint Lipps Pager: [phone] 06/18/2011, 3:03 PM

## 2011-06-18 NOTE — Discharge Summary (Signed)
Patient ID: Michael Cunningham MRN: [id number] DOB/AGE: May 24, 1945 66 y.o.  Admit date: 06/13/2011 Discharge date: 06/18/2011  Primary Care Physician:  Gildardo Griffes, MD, MD Gastroenterologist:  Dr. Vida Rigger   Discharge Diagnoses:   Principal Problem:  *Small bowel obstruction Active Problems:  Abdominal pain, generalized  Nausea & vomiting  Diarrhea  Diabetes mellitus  Hypertension   Medication List  As of 06/18/2011  1:57 PM   TAKE these medications         acetaminophen 500 MG tablet   Commonly known as: TYLENOL   Take 1,000 mg by mouth every 6 (six) hours as needed. For pain      aspirin EC 81 MG tablet   Take 81 mg by mouth daily.      CENTRUM SILVER ULTRA MENS PO   Take 1 tablet by mouth daily.      glipiZIDE 10 MG 24 hr tablet   Commonly known as: GLUCOTROL XL   Take 10 mg by mouth 2 (two) times daily.      insulin detemir 100 UNIT/ML injection   Commonly known as: LEVEMIR   Inject 90 Units into the skin at bedtime.      losartan 50 MG tablet   Commonly known as: COZAAR   Take 50 mg by mouth daily.      simvastatin 20 MG tablet   Commonly known as: ZOCOR   Take 20 mg by mouth every evening.            Consults:  [facility]  Brief H and P: From the admission note:  Michael Cunningham is an 66 y.o. male who presents to the ED with complaints of ABD Pain Nausea, vomiting and diarrhea since 5pm the day before (3/31). He reports having generalized ABD Pain, and denies having any hematemsis, hematiochezia, or melena passage. He was unable to hold down food or liquids, and had been unable to take his medications. In the ED a CT scan was performed which revealed a Small Bowel Obstruction. An NGTube was placed and his case was discussed with General Surgery On Call and he was referred for medical admission.   1.  Small Bowel Obstruction.  The patient reported that CT Abdomen in 1996 he had a viral small bowel obstruction.  This SBO was his second. CT abdomen and  pelvis revealed small bowel obstruction with transition zone.  Patient had large amount of output through the NG with brownish color for the first 48 hours.  He had tenderness in his epigastrium with tympanic bowel sounds.  Mid and lower abdomen were nontender to palpation and had no bowel sounds. Gradually his stomach decompressed and he started to pass flatus.  His NG tube was clamped he did well, consequently the NG tube was removed.  On April 4 he was started on clear liquid diet, and able to advance to a solid diet before discharge. At the time of discharge he still had not had a bowel movement but was passing flatus, and his abdomen was soft and non tender with good bowel sounds.  The patient reports that he is already scheduled with Dr. Vida Rigger for colonoscopy in the coming weeks.  2.  Hypertension. The patient was placed on IV blood pressure medications until he was able to take POs.  His blood pressure was minimally elevated stable through out the hospitalization.  He will be discharged on his previous home medication regimen.  3.  Diabetes Mellitus.  Hgb A1C 8.9 on June 14, 2011.  Cbgs managed with insulin as an inpatient.  Will return to previous home medication regimen on discharge.  4.  Vomiting / Diarrhea. The patient reported diarrhea prior to admission.  He had no diarrhea during his hospitalization.   Physical Exam on Discharge: General: Alert, awake, oriented x3, in no acute distress. HEENT: No bruits, no goiter. Heart: Regular rate and rhythm, without murmurs, rubs, gallops. Lungs: Clear to auscultation bilaterally. Abdomen: Soft, nontender, nondistended, positive bowel sounds. Extremities: No clubbing cyanosis or edema with positive pedal pulses. Neuro: Grossly intact, nonfocal.  Filed Vitals:   06/17/11 2047 06/18/11 0158 06/18/11 0529 06/18/11 0624  BP: 144/92 145/79 172/97 145/92  Pulse: 87 75 72   Temp: 98 F (36.7 C) 98.2 F (36.8 C) 98 F (36.7 C)   TempSrc:  Oral Oral Oral   Resp: 18 18 18    Height:      Weight:      SpO2: 95% 97% 99%      Intake/Output Summary (Last 24 hours) at 06/18/11 1357 Last data filed at 06/18/11 4540  Gross per 24 hour  Intake 3566.67 ml  Output   2600 ml  Net 966.67 ml    Basic Metabolic Panel:  Lab 06/18/11 9811 06/17/11 0532  NA 138 136  K 4.0 3.7  CL 102 100  CO2 27 25  GLUCOSE 138* 89  BUN 11 13  CREATININE 0.82 0.65  CALCIUM 8.8 8.6  MG -- --  PHOS -- --   Liver Function Tests:  Lab 06/13/11 2115  AST 39*  ALT 35  ALKPHOS 88  BILITOT 1.1  PROT 7.6  ALBUMIN 3.9    Lab 06/13/11 2115  LIPASE 9*  AMYLASE --   No results found for this basename: AMMONIA:2 in the last 168 hours CBC:  Lab 06/16/11 0640 06/14/11 0635 06/13/11 2115  WBC 7.7 6.6 --  NEUTROABS 5.4 -- 6.7  HGB 14.5 15.5 --  HCT 42.4 43.4 --  MCV 90.6 88.9 --  PLT 167 142* --   CBG:  Lab 06/18/11 0722 06/18/11 0438 06/18/11 0022 06/17/11 2045 06/17/11 1733 06/17/11 1209  GLUCAP 168* 133* 115* 220* 176* 157*   Hemoglobin A1C:  Lab 06/14/11 0048  HGBA1C 8.9*   Significant Diagnostic Studies:  Dg Abd 1 View  06/17/2011  *RADIOLOGY REPORT*  Clinical Data: Small bowel obstruction, follow-up  ABDOMEN - 1 VIEW  Comparison: Abdomen films of 04/03 and 06/15/2011.  Findings: A supine film of the abdomen shows no present small bowel obstruction.  Only a small amount of small bowel air is noted in the left upper quadrant which is not distended.  Some oral contrast material is noted within the nondistended colon.  No opaque calculi are seen.  IMPRESSION: No present evidence of small bowel obstruction.  Original Report Authenticated By: Juline Patch, M.D.   Ct Abdomen Pelvis W Contrast  06/13/2011  *RADIOLOGY REPORT*  Clinical Data: Bilateral mid and lower abdominal pain for 24 hours. Nausea and vomiting.  CT ABDOMEN AND PELVIS WITH CONTRAST  Technique:  Multidetector CT imaging of the abdomen and pelvis was performed following  the standard protocol during bolus administration of intravenous contrast.  Contrast: OMNIPAQUE IOHEXOL 300 MG/ML IJ SOLN, 40mL OMNIPAQUE IOHEXOL 300 MG/ML IJ SOLN  Comparison: None.  Findings: Atelectasis in the lung bases.  Probable coronary artery calcifications.  Diffuse low attenuation change throughout the liver consistent with fatty infiltration.  The gallbladder, spleen, adrenal glands, kidneys, pancreas, and retroperitoneal lymph nodes are unremarkable.  Mild calcification of the abdominal aorta without aneurysm.  The stomach is mildly distended without focal wall thickening.  The there is mild distension of mostly fluid-filled small bowel extending throughout the jejunum with decompression of the terminal ileum.  The transition zone is located in the right lower quadrant.  No mass or obstructing lesion is demonstrated. Changes suggest an adhesion.  No free fluid or free air in the abdomen.  Prominent visceral adipose tissues.  Pelvis:  Prostate gland is not enlarged.  No free or loculated pelvic fluid collections.  No significant pelvic lymphadenopathy. No inflammatory changes involving the sigmoid colon.  The colon is mostly decompressed without wall thickening.  The appendix is normal.  Degenerative changes in the lumbar spine.  The  IMPRESSION: Small bowel obstruction with transition zone in the right lower quadrant.  No obstructing mass lesion demonstrated.  Changes likely due to adhesion or focal stricture.  Diffuse fatty infiltration of the liver.  Original Report Authenticated By: Marlon Pel, M.D.   Dg Abd 2 Views  06/16/2011  *RADIOLOGY REPORT*  Clinical Data: Small bowel obstruction.  Abdominal pain.  ABDOMEN - 2 VIEW  Comparison: 06/15/2011  Findings: NG tube remains present in the stomach.  No gaseous distention of small bowel currently.  Gas and oral contrast material noted within decompressed colon.  No free air organomegaly.  IMPRESSION: Paucity of gas throughout the small  bowel currently.  No current evidence for small bowel obstruction.  Original Report Authenticated By: Cyndie Chime, M.D.   Dg Abd 2 Views  06/15/2011  *RADIOLOGY REPORT*  Clinical Data: Pain and decreased bowel sounds  ABDOMEN - 2 VIEW  Comparison: CT scan dated June 13, 2011  Findings: There are several mildly dilated loops of small bowel within the mid upper abdomen which appear stable to slightly improved compared with the scout from the prior CT scan.  The nasogastric tube tip overlies the gastric body level.  Contrast from recent CT scan is present within the colon.  No pneumoperitoneum.  IMPRESSION: Partial small bowel obstruction persists which appears stable to slightly improved compared with the recent CT scan.  Original Report Authenticated By: Brandon Melnick, M.D.      Disposition and Follow-up: Stable for discharge to home with PCP and Gastroenterology follow up.    Discharge Orders    Future Orders Please Complete By Expires   Diet - low sodium heart healthy      Increase activity slowly      Discharge instructions      Comments:   Advance diet slowly.  Eat small but frequent meals.  If you have pain back down to liquids only and call your Doctor.     Follow-up Information    Schedule an appointment as soon as possible for a visit with Gildardo Griffes, MD.      Follow up with [facility], MD. (As previously scheduled for Colonoscopy)    Contact information:   [address] [employer], [place]. [place] [address] [phone]           Time spent on Discharge: 30 min.  Signed: Conley [place] [facility] 06/18/2011, 1:57 PM [phone]

## 2011-06-18 NOTE — Progress Notes (Signed)
   CARE MANAGEMENT NOTE 06/18/2011  Patient:  Michael Cunningham, Michael Cunningham   Account Number:  [id number]  Date Initiated:  06/14/2011  Documentation initiated by:  Donn Pierini  Subjective/Objective Assessment:   Pt admitted with Small bowel obstruction     Action/Plan:   PTA pt lived at home with spouse, was independent with ADLs- pt has NGT   Anticipated DC Date:  06/18/2011   Anticipated DC Plan:  HOME/SELF CARE      DC Planning Services  CM consult      Choice offered to / List presented to:             Status of service:  Completed, signed off Medicare Important Message given?   (If response is "NO", the following Medicare IM given date fields will be blank) Date Medicare IM given:   Date Additional Medicare IM given:    Discharge Disposition:  HOME/SELF CARE  Per UR Regulation:    If discussed at Long Length of Stay Meetings, dates discussed:    Comments:  PCP- Gildardo Griffes  06/18/11 9:40 Letha Cape RN, BSN [phone] patient discharged to home today, no needs.  06/17/11 16:22 Letha Cape RN, BSN [phone] patient on clear liquid diet, will advance diet and plan for dc tomorrow.  06/15/11- 1515- Donn Pierini RN, BSN [phone] Spoke with pt at bedside- per conversation pt lives at home with wife and uses cane to assist with ambulation secondary to knee issues. Pt states that he has medication benefits part D, and uses [facility]. Pt still with NGT and surgery following for potential need for surgery. CM to follow. Plan to return home when medically stable.

## 2011-06-18 NOTE — Progress Notes (Signed)
Patient ID: Michael Cunningham, male   DOB: 02/13/1946, 66 y.o.   MRN: [id number]    Subjective: Pt feels great.  No further pain.  Tolerating regular diet.  No nausea or vomiting.  Still with flatus.  Objective: Vital signs in last 24 hours: Temp:  [98 F (36.7 C)-98.2 F (36.8 C)] 98 F (36.7 C) (04/05 0529) Pulse Rate:  [72-87] 72  (04/05 0529) Resp:  [18] 18  (04/05 0529) BP: (144-172)/(79-97) 145/92 mmHg (04/05 0624) SpO2:  [95 %-99 %] 99 % (04/05 0529) Last BM Date: 06/13/11  Intake/Output from previous day: 04/04 0701 - 04/05 0700 In: 4046.7 [P.O.:1680; I.V.:2366.7] Out: 2875 [Urine:2875] Intake/Output this shift:    PE: Abd: soft, NT, ND, +BS  Lab Results:   [facility] 06/16/11 0640  WBC 7.7  HGB 14.5  HCT 42.4  PLT 167   BMET  Basename 06/18/11 0543 06/17/11 0532  NA 138 136  K 4.0 3.7  CL 102 100  CO2 27 25  GLUCOSE 138* 89  BUN 11 13  CREATININE 0.82 0.65  CALCIUM 8.8 8.6   PT/INR No results found for this basename: LABPROT:2,INR:2 in the last 72 hours CMP     Component Value Date/Time   NA 138 06/18/2011 0543   K 4.0 06/18/2011 0543   CL 102 06/18/2011 0543   CO2 27 06/18/2011 0543   GLUCOSE 138* 06/18/2011 0543   BUN 11 06/18/2011 0543   CREATININE 0.82 06/18/2011 0543   CALCIUM 8.8 06/18/2011 0543   PROT 7.6 06/13/2011 2115   ALBUMIN 3.9 06/13/2011 2115   AST 39* 06/13/2011 2115   ALT 35 06/13/2011 2115   ALKPHOS 88 06/13/2011 2115   BILITOT 1.1 06/13/2011 2115   GFRNONAA >90 06/18/2011 0543   GFRAA >90 06/18/2011 0543   Lipase     Component Value Date/Time   LIPASE 9* 06/13/2011 2115       Studies/Results: Dg Abd 1 View  06/17/2011  *RADIOLOGY REPORT*  Clinical Data: Small bowel obstruction, follow-up  ABDOMEN - 1 VIEW  Comparison: Abdomen films of 04/03 and 06/15/2011.  Findings: A supine film of the abdomen shows no present small bowel obstruction.  Only a small amount of small bowel air is noted in the left upper quadrant which is not distended.  Some oral  contrast material is noted within the nondistended colon.  No opaque calculi are seen.  IMPRESSION: No present evidence of small bowel obstruction.  Original Report Authenticated By: Juline Patch, M.D.   Dg Abd 2 Views  06/16/2011  *RADIOLOGY REPORT*  Clinical Data: Small bowel obstruction.  Abdominal pain.  ABDOMEN - 2 VIEW  Comparison: 06/15/2011  Findings: NG tube remains present in the stomach.  No gaseous distention of small bowel currently.  Gas and oral contrast material noted within decompressed colon.  No free air organomegaly.  IMPRESSION: Paucity of gas throughout the small bowel currently.  No current evidence for small bowel obstruction.  Original Report Authenticated By: Cyndie Chime, M.D.    Anti-infectives: Anti-infectives    None       Assessment/Plan  1. SBO, resolved  Plan: 1. Patient is stable.  He is ok for d/c from our standpoint.  He already has an appointment to see Dr. Ewing Schlein on 07-07-11.   LOS: 5 days    OSBORNE,KELLY E 06/18/2011  Doing well.  Agree.  Ovidio Kin, MD, [facility] Pager: [phone] Office phone:  [phone]

## 2011-06-18 NOTE — Progress Notes (Signed)
Pt discharged to home per MD order.  IV removed, dressing and pressure applied, site unremarkable.  Discharge instructions and follow-up appointments given.  All belongings sent with pt and all questions answered.  Pt transported in wheelchair by volunteer and traveled home with wife by private vehicle.

## 2011-06-20 NOTE — ED Provider Notes (Signed)
Evaluation and management procedures were performed by the PA/NP/Resident Physician under my supervision/collaboration.   Orlandria Kissner D Alyssandra Hulsebus, MD 06/20/11 1601 

## 2011-06-25 DIAGNOSIS — M25569 Pain in unspecified knee: Secondary | ICD-10-CM | POA: Diagnosis not present

## 2011-06-29 ENCOUNTER — Other Ambulatory Visit: Payer: Self-pay | Admitting: Orthopedic Surgery

## 2011-06-29 MED ORDER — DEXAMETHASONE SODIUM PHOSPHATE 10 MG/ML IJ SOLN: 10.0000 mg | Freq: Once | INTRAMUSCULAR | Status: DC

## 2011-06-30 DIAGNOSIS — K560 Paralytic ileus: Secondary | ICD-10-CM | POA: Diagnosis not present

## 2011-06-30 DIAGNOSIS — E119 Type 2 diabetes mellitus without complications: Secondary | ICD-10-CM | POA: Diagnosis not present

## 2011-06-30 DIAGNOSIS — I10 Essential (primary) hypertension: Secondary | ICD-10-CM | POA: Diagnosis not present

## 2011-07-07 ENCOUNTER — Other Ambulatory Visit: Payer: Self-pay | Admitting: Gastroenterology

## 2011-07-07 DIAGNOSIS — Z8371 Family history of colonic polyps: Secondary | ICD-10-CM | POA: Diagnosis not present

## 2011-07-07 DIAGNOSIS — R109 Unspecified abdominal pain: Secondary | ICD-10-CM | POA: Diagnosis not present

## 2011-07-07 DIAGNOSIS — R933 Abnormal findings on diagnostic imaging of other parts of digestive tract: Secondary | ICD-10-CM | POA: Diagnosis not present

## 2011-07-12 ENCOUNTER — Ambulatory Visit
Admission: RE | Admit: 2011-07-12 | Discharge: 2011-07-12 | Disposition: A | Discharge status: Home / Self Care | Payer: Medicare Other | Source: Ambulatory Visit | Attending: Gastroenterology | Admitting: Gastroenterology

## 2011-07-12 DIAGNOSIS — K7689 Other specified diseases of liver: Secondary | ICD-10-CM | POA: Diagnosis not present

## 2011-07-12 DIAGNOSIS — R109 Unspecified abdominal pain: Secondary | ICD-10-CM | POA: Diagnosis not present

## 2011-07-13 ENCOUNTER — Other Ambulatory Visit: Payer: Medicare Other

## 2011-07-14 LAB — HM COLONOSCOPY

## 2011-08-03 ENCOUNTER — Encounter (HOSPITAL_COMMUNITY): Payer: Self-pay | Admitting: Pharmacy Technician

## 2011-08-04 DIAGNOSIS — D126 Benign neoplasm of colon, unspecified: Secondary | ICD-10-CM | POA: Diagnosis not present

## 2011-08-04 DIAGNOSIS — Z1211 Encounter for screening for malignant neoplasm of colon: Secondary | ICD-10-CM | POA: Diagnosis not present

## 2011-08-04 DIAGNOSIS — K5730 Diverticulosis of large intestine without perforation or abscess without bleeding: Secondary | ICD-10-CM | POA: Diagnosis not present

## 2011-08-10 ENCOUNTER — Encounter (HOSPITAL_COMMUNITY): Payer: Self-pay

## 2011-08-10 ENCOUNTER — Other Ambulatory Visit: Payer: Self-pay

## 2011-08-10 ENCOUNTER — Ambulatory Visit (HOSPITAL_COMMUNITY)
Admission: RE | Admit: 2011-08-10 | Discharge: 2011-08-10 | Disposition: A | Discharge status: Home / Self Care | Payer: Medicare Other | Source: Ambulatory Visit | Attending: Orthopedic Surgery | Admitting: Orthopedic Surgery

## 2011-08-10 ENCOUNTER — Encounter (HOSPITAL_COMMUNITY)
Admission: RE | Admit: 2011-08-10 | Discharge: 2011-08-10 | Disposition: A | Discharge status: Home / Self Care | Payer: Medicare Other | Source: Ambulatory Visit | Attending: Orthopedic Surgery | Admitting: Orthopedic Surgery

## 2011-08-10 DIAGNOSIS — Z01818 Encounter for other preprocedural examination: Secondary | ICD-10-CM | POA: Diagnosis not present

## 2011-08-10 DIAGNOSIS — Z01812 Encounter for preprocedural laboratory examination: Secondary | ICD-10-CM | POA: Insufficient documentation

## 2011-08-10 DIAGNOSIS — Z01811 Encounter for preprocedural respiratory examination: Secondary | ICD-10-CM | POA: Diagnosis not present

## 2011-08-10 DIAGNOSIS — S83289A Other tear of lateral meniscus, current injury, unspecified knee, initial encounter: Secondary | ICD-10-CM | POA: Insufficient documentation

## 2011-08-10 DIAGNOSIS — Z01810 Encounter for preprocedural cardiovascular examination: Secondary | ICD-10-CM | POA: Insufficient documentation

## 2011-08-10 DIAGNOSIS — X58XXXA Exposure to other specified factors, initial encounter: Secondary | ICD-10-CM | POA: Insufficient documentation

## 2011-08-10 HISTORY — DX: Other complications of anesthesia, initial encounter: T88.59XA

## 2011-08-10 HISTORY — DX: Benign lipomatous neoplasm, unspecified: D17.9

## 2011-08-10 HISTORY — DX: Adverse effect of unspecified anesthetic, initial encounter: T41.45XA

## 2011-08-10 LAB — CBC
MCV: 88.8 fL (ref 78.0–100.0)
Platelets: 189 10*3/uL (ref 150–400)
RBC: 4.9 MIL/uL (ref 4.22–5.81)
RDW: 12.8 % (ref 11.5–15.5)
WBC: 5.1 10*3/uL (ref 4.0–10.5)

## 2011-08-10 LAB — SURGICAL PCR SCREEN: MRSA, PCR: NEGATIVE

## 2011-08-10 LAB — BASIC METABOLIC PANEL
CO2: 28 mEq/L (ref 19–32)
Calcium: 9.4 mg/dL (ref 8.4–10.5)
Creatinine, Ser: 0.7 mg/dL (ref 0.50–1.35)
GFR calc Af Amer: >90 mL/min (ref >90)
GFR calc non Af Amer: >90 mL/min (ref >90)
Sodium: 137 mEq/L (ref 135–145)

## 2011-08-10 NOTE — Pre-Procedure Instructions (Addendum)
08-10-11 EKG/ CXR done today. Labs viewabie in [place] faxed sent.

## 2011-08-10 NOTE — Patient Instructions (Signed)
20 Jeanette Moffatt  08/10/2011   Your procedure is scheduled on: 5-31-  -2013  Report to [facility] at      1230 PM.  Call this number if you have problems the morning of surgery: [phone]   Remember:   Do not eat food:After Midnight.  May have clear liquids:up to 6 Hours before arrival. Nothing after : 0900am  Clear liquids include soda, tea, black coffee, apple or grape juice, broth.  Take these medicines the morning of surgery with A SIP OF WATER: none. Use Levermir 45 units night before sugar(1/2 usual dose).   Do not wear jewelry, make-up or nail polish.  Do not wear lotions, powders, or perfumes. You may wear deodorant.  Do not shave 48 hours prior to surgery.(face and neck okay, no shaving of legs)  Do not bring valuables to the hospital.  Contacts, dentures or bridgework may not be worn into surgery.  Leave suitcase in the car. After surgery it may be brought to your room.  For patients admitted to the hospital, checkout time is 11:00 AM the day of discharge.   Patients discharged the day of surgery will not be allowed to drive home.  Name and phone number of your driver: spouse  Special Instructions: CHG Shower Use Special Wash: 1/2 bottle night before surgery and 1/2 bottle morning of surgery.(avoid face and genitals)   Please read over the following fact sheets that you were given: MRSA Information, Incentive Spirometry Instruction.

## 2011-08-13 ENCOUNTER — Encounter (HOSPITAL_COMMUNITY): Payer: Self-pay | Admitting: *Deleted

## 2011-08-13 ENCOUNTER — Encounter (HOSPITAL_COMMUNITY): Payer: Self-pay | Admitting: Anesthesiology

## 2011-08-13 ENCOUNTER — Encounter (HOSPITAL_COMMUNITY)
Admission: RE | Disposition: A | Discharge status: Home / Self Care | Payer: Self-pay | Source: Ambulatory Visit | Attending: Orthopedic Surgery

## 2011-08-13 ENCOUNTER — Ambulatory Visit (HOSPITAL_COMMUNITY)
Admission: RE | Admit: 2011-08-13 | Discharge: 2011-08-13 | Disposition: A | Discharge status: Home / Self Care | Payer: Medicare Other | Source: Ambulatory Visit | Attending: Orthopedic Surgery | Admitting: Orthopedic Surgery

## 2011-08-13 ENCOUNTER — Ambulatory Visit (HOSPITAL_COMMUNITY): Payer: Medicare Other | Admitting: Anesthesiology

## 2011-08-13 DIAGNOSIS — M25561 Pain in right knee: Secondary | ICD-10-CM | POA: Diagnosis present

## 2011-08-13 DIAGNOSIS — Z794 Long term (current) use of insulin: Secondary | ICD-10-CM | POA: Diagnosis not present

## 2011-08-13 DIAGNOSIS — M23206 Derangement of unspecified meniscus due to old tear or injury, right knee: Secondary | ICD-10-CM

## 2011-08-13 DIAGNOSIS — M6580 Other synovitis and tenosynovitis, unspecified site: Secondary | ICD-10-CM | POA: Diagnosis not present

## 2011-08-13 DIAGNOSIS — M23302 Other meniscus derangements, unspecified lateral meniscus, unspecified knee: Secondary | ICD-10-CM | POA: Diagnosis not present

## 2011-08-13 DIAGNOSIS — I10 Essential (primary) hypertension: Secondary | ICD-10-CM | POA: Insufficient documentation

## 2011-08-13 DIAGNOSIS — Z7982 Long term (current) use of aspirin: Secondary | ICD-10-CM | POA: Diagnosis not present

## 2011-08-13 DIAGNOSIS — M6750 Plica syndrome, unspecified knee: Secondary | ICD-10-CM | POA: Insufficient documentation

## 2011-08-13 DIAGNOSIS — M2240 Chondromalacia patellae, unspecified knee: Secondary | ICD-10-CM | POA: Insufficient documentation

## 2011-08-13 DIAGNOSIS — IMO0002 Reserved for concepts with insufficient information to code with codable children: Secondary | ICD-10-CM | POA: Diagnosis not present

## 2011-08-13 DIAGNOSIS — M12269 Villonodular synovitis (pigmented), unspecified knee: Secondary | ICD-10-CM | POA: Diagnosis not present

## 2011-08-13 DIAGNOSIS — M25562 Pain in left knee: Secondary | ICD-10-CM

## 2011-08-13 DIAGNOSIS — E119 Type 2 diabetes mellitus without complications: Secondary | ICD-10-CM | POA: Diagnosis not present

## 2011-08-13 DIAGNOSIS — M23305 Other meniscus derangements, unspecified medial meniscus, unspecified knee: Secondary | ICD-10-CM | POA: Diagnosis not present

## 2011-08-13 DIAGNOSIS — M23200 Derangement of unspecified lateral meniscus due to old tear or injury, right knee: Secondary | ICD-10-CM

## 2011-08-13 DIAGNOSIS — X58XXXA Exposure to other specified factors, initial encounter: Secondary | ICD-10-CM | POA: Insufficient documentation

## 2011-08-13 HISTORY — DX: Pain in right knee: M25.561

## 2011-08-13 HISTORY — PX: KNEE ARTHROSCOPY: SHX127

## 2011-08-13 HISTORY — DX: Pain in left knee: M25.562

## 2011-08-13 LAB — GLUCOSE, CAPILLARY
Glucose-Capillary: 134 mg/dL — ABNORMAL HIGH (ref 70–99)
Glucose-Capillary: 219 mg/dL — ABNORMAL HIGH (ref 70–99)

## 2011-08-13 SURGERY — ARTHROSCOPY, KNEE
Anesthesia: General | Site: Knee | Laterality: Right | Wound class: Clean

## 2011-08-13 MED ORDER — FENTANYL CITRATE 0.05 MG/ML IJ SOLN
INTRAMUSCULAR | Status: AC
  Filled 2011-08-13: qty 2

## 2011-08-13 MED ORDER — HYDROMORPHONE HCL PF 1 MG/ML IJ SOLN: 0.5000 mg | INTRAMUSCULAR | Status: DC | PRN

## 2011-08-13 MED ORDER — PROPOFOL 10 MG/ML IV BOLUS
INTRAVENOUS | Status: DC | PRN
  Administered 2011-08-13: 200 mg via INTRAVENOUS

## 2011-08-13 MED ORDER — FENTANYL CITRATE 0.05 MG/ML IJ SOLN
INTRAMUSCULAR | Status: DC | PRN
  Administered 2011-08-13 (×3): 25 ug via INTRAVENOUS
  Administered 2011-08-13 (×2): 12.5 ug via INTRAVENOUS

## 2011-08-13 MED ORDER — LACTATED RINGERS IV SOLN
INTRAVENOUS | Status: DC | PRN
  Administered 2011-08-13: 15:00:00 via INTRAVENOUS

## 2011-08-13 MED ORDER — CHLORHEXIDINE GLUCONATE 4 % EX LIQD
60.0000 mL | Freq: Once | CUTANEOUS | Status: DC
  Filled 2011-08-13: qty 60

## 2011-08-13 MED ORDER — PROMETHAZINE HCL 25 MG/ML IJ SOLN: 6.2500 mg | INTRAMUSCULAR | Status: DC | PRN

## 2011-08-13 MED ORDER — OXYCODONE-ACETAMINOPHEN 5-325 MG PO TABS
ORAL_TABLET | ORAL | Status: AC
  Filled 2011-08-13: qty 1

## 2011-08-13 MED ORDER — METHOCARBAMOL 100 MG/ML IJ SOLN
500.0000 mg | Freq: Once | INTRAVENOUS | Status: AC
  Administered 2011-08-13: 500 mg via INTRAVENOUS
  Filled 2011-08-13: qty 5

## 2011-08-13 MED ORDER — KETOROLAC TROMETHAMINE 15 MG/ML IJ SOLN
INTRAMUSCULAR | Status: AC
  Filled 2011-08-13: qty 1

## 2011-08-13 MED ORDER — LIDOCAINE HCL (CARDIAC) 20 MG/ML IV SOLN
INTRAVENOUS | Status: DC | PRN
  Administered 2011-08-13: 50 mg via INTRAVENOUS

## 2011-08-13 MED ORDER — METHOCARBAMOL 500 MG PO TABS: 500.0000 mg | ORAL_TABLET | Freq: Four times a day (QID) | ORAL | Status: AC

## 2011-08-13 MED ORDER — BUPIVACAINE-EPINEPHRINE 0.25% -1:200000 IJ SOLN
INTRAMUSCULAR | Status: DC | PRN
  Administered 2011-08-13: 20 mL

## 2011-08-13 MED ORDER — KETOROLAC TROMETHAMINE 15 MG/ML IJ SOLN
15.0000 mg | Freq: Once | INTRAMUSCULAR | Status: AC
  Administered 2011-08-13: 15 mg via INTRAVENOUS

## 2011-08-13 MED ORDER — ACETAMINOPHEN 10 MG/ML IV SOLN
1000.0000 mg | Freq: Once | INTRAVENOUS | Status: DC
  Filled 2011-08-13: qty 100

## 2011-08-13 MED ORDER — HYDROMORPHONE HCL PF 1 MG/ML IJ SOLN
INTRAMUSCULAR | Status: AC
  Administered 2011-08-13 (×2): 0.5 mg
  Filled 2011-08-13: qty 1

## 2011-08-13 MED ORDER — LACTATED RINGERS IV SOLN: INTRAVENOUS | Status: DC

## 2011-08-13 MED ORDER — ACETAMINOPHEN 10 MG/ML IV SOLN
INTRAVENOUS | Status: AC
  Filled 2011-08-13: qty 100

## 2011-08-13 MED ORDER — MIDAZOLAM HCL 5 MG/5ML IJ SOLN
INTRAMUSCULAR | Status: DC | PRN
  Administered 2011-08-13: 2 mg via INTRAVENOUS

## 2011-08-13 MED ORDER — LACTATED RINGERS IR SOLN
Status: DC | PRN
  Administered 2011-08-13: 3000 mL
  Administered 2011-08-13: 12000 mL

## 2011-08-13 MED ORDER — CEFAZOLIN SODIUM-DEXTROSE 2-3 GM-% IV SOLR
2.0000 g | INTRAVENOUS | Status: AC
  Administered 2011-08-13: 2 g via INTRAVENOUS

## 2011-08-13 MED ORDER — FENTANYL CITRATE 0.05 MG/ML IJ SOLN
25.0000 ug | INTRAMUSCULAR | Status: DC | PRN
  Administered 2011-08-13: 50 ug via INTRAVENOUS
  Administered 2011-08-13 (×3): 25 ug via INTRAVENOUS
  Administered 2011-08-13: 50 ug via INTRAVENOUS

## 2011-08-13 MED ORDER — CEFAZOLIN SODIUM-DEXTROSE 2-3 GM-% IV SOLR
INTRAVENOUS | Status: AC
  Filled 2011-08-13: qty 50

## 2011-08-13 MED ORDER — FENTANYL CITRATE 0.05 MG/ML IJ SOLN
25.0000 ug | INTRAMUSCULAR | Status: DC | PRN
  Administered 2011-08-13: 25 ug via INTRAVENOUS

## 2011-08-13 MED ORDER — SODIUM CHLORIDE 0.9 % IV SOLN: INTRAVENOUS | Status: DC

## 2011-08-13 MED ORDER — ONDANSETRON HCL 4 MG/2ML IJ SOLN
INTRAMUSCULAR | Status: DC | PRN
  Administered 2011-08-13: 4 mg via INTRAVENOUS

## 2011-08-13 MED ORDER — LIDOCAINE HCL 1 % IJ SOLN
INTRAMUSCULAR | Status: AC
  Filled 2011-08-13: qty 20

## 2011-08-13 MED ORDER — OXYCODONE-ACETAMINOPHEN 5-325 MG PO TABS: 1.0000 | ORAL_TABLET | ORAL | Status: AC | PRN

## 2011-08-13 MED ORDER — OXYCODONE-ACETAMINOPHEN 5-325 MG PO TABS
1.0000 | ORAL_TABLET | ORAL | Status: DC | PRN
  Administered 2011-08-13: 1 via ORAL

## 2011-08-13 MED ORDER — BUPIVACAINE-EPINEPHRINE PF 0.25-1:200000 % IJ SOLN
INTRAMUSCULAR | Status: AC
  Filled 2011-08-13: qty 30

## 2011-08-13 MED ORDER — ACETAMINOPHEN 10 MG/ML IV SOLN
INTRAVENOUS | Status: DC | PRN
  Administered 2011-08-13: 1000 mg via INTRAVENOUS

## 2011-08-13 SURGICAL SUPPLY — 27 items
BANDAGE ELASTIC 6 VELCRO ST LF (GAUZE/BANDAGES/DRESSINGS) ×2 IMPLANT
BLADE 4.2CUDA (BLADE) ×2 IMPLANT
BLADE CUDA SHAVER 3.5 (BLADE) IMPLANT
CLOTH BEACON ORANGE TIMEOUT ST (SAFETY) ×2 IMPLANT
CUFF TOURN SGL QUICK 34 (TOURNIQUET CUFF) ×1
CUFF TRNQT CYL 34X4X40X1 (TOURNIQUET CUFF) ×1 IMPLANT
DRAPE U-SHAPE 47X51 STRL (DRAPES) ×2 IMPLANT
DRSG EMULSION OIL 3X3 NADH (GAUZE/BANDAGES/DRESSINGS) ×2 IMPLANT
DRSG PAD ABDOMINAL 8X10 ST (GAUZE/BANDAGES/DRESSINGS) ×2 IMPLANT
DURAPREP 26ML APPLICATOR (WOUND CARE) ×2 IMPLANT
GLOVE BIO SURGEON STRL SZ7.5 (GLOVE) ×2 IMPLANT
GLOVE BIO SURGEON STRL SZ8 (GLOVE) ×2 IMPLANT
GLOVE BIOGEL PI IND STRL 8 (GLOVE) ×1 IMPLANT
GLOVE BIOGEL PI INDICATOR 8 (GLOVE) ×1
GOWN STRL NON-REIN LRG LVL3 (GOWN DISPOSABLE) ×2 IMPLANT
MANIFOLD NEPTUNE II (INSTRUMENTS) ×4 IMPLANT
PACK ARTHROSCOPY WL (CUSTOM PROCEDURE TRAY) ×2 IMPLANT
PACK ICE MAXI GEL EZY WRAP (MISCELLANEOUS) ×6 IMPLANT
PADDING CAST COTTON 6X4 STRL (CAST SUPPLIES) ×2 IMPLANT
POSITIONER SURGICAL ARM (MISCELLANEOUS) ×2 IMPLANT
SET ARTHROSCOPY TUBING (MISCELLANEOUS) ×1
SET ARTHROSCOPY TUBING LN (MISCELLANEOUS) ×1 IMPLANT
SPONGE GAUZE 4X4 12PLY (GAUZE/BANDAGES/DRESSINGS) ×2 IMPLANT
SUT ETHILON 4 0 PS 2 18 (SUTURE) ×2 IMPLANT
TOWEL OR 17X26 10 PK STRL BLUE (TOWEL DISPOSABLE) ×2 IMPLANT
WAND 90 DEG TURBOVAC W/CORD (SURGICAL WAND) ×2 IMPLANT
WRAP KNEE MAXI GEL POST OP (GAUZE/BANDAGES/DRESSINGS) ×4 IMPLANT

## 2011-08-13 NOTE — H&P (Signed)
  CC- Michael Cunningham is a 66 y.o. male who presents with right knee pain.  HPI- . Knee Pain: Patient presents with knee pain involving the  right knee. Onset of the symptoms was several months ago. Inciting event: Persistent pain and mechanical symptoms after arthroscopic surgery last November. Current symptoms include giving out, locking, pain located laterally and popping sensation. Pain is aggravated by rising after sitting, squatting, standing and walking.  Patient has had prior knee problems. Evaluation to date: MRI: possible recurrent medial meniscal tear; possible loose fragment in lateral gutter. Treatment to date: avoidance of offending activity and OTC analgesics which are ineffective.  Past Medical History  Diagnosis Date  . Hypertension   . Complication of anesthesia 08-10-11    11'12 Surgery with excrutiating "head about to explode" upon awakening  -lasted several hours postop  . Diabetes mellitus 08-10-11    dx. 14 yrs ago, Insulin started 8'12, diet only previous several yrs.  . Multiple lipomas 08-10-11    hx. mulitple and some remains -arms, legs    Past Surgical History  Procedure Date  . Knee arthroscopy 08-10-11    01-29-11 Right knee scope(cyst rem., debridement, meniscal tear)-Dr. August Saucer  . Lipoma excision 08-10-11    multiple(arms), legs-remains with some.    Prior to Admission medications   Medication Sig Start Date End Date Taking? Authorizing Provider  acetaminophen (TYLENOL) 500 MG tablet Take 1,000 mg by mouth every 6 (six) hours as needed. For pain   Yes Historical Provider, MD  aspirin EC 81 MG tablet Take 81 mg by mouth daily with breakfast.     Historical Provider, MD  glipiZIDE (GLUCOTROL XL) 10 MG 24 hr tablet Take 10 mg by mouth 2 (two) times daily.    Historical Provider, MD  insulin detemir (LEVEMIR) 100 UNIT/ML injection Inject 90 Units into the skin at bedtime. 06/18/11   Stephani Police, PA  losartan (COZAAR) 50 MG tablet Take 50 mg by mouth daily with  breakfast.     Historical Provider, MD  Multiple Vitamins-Minerals (CENTRUM SILVER ULTRA MENS PO) Take 1 tablet by mouth daily.    Historical Provider, MD  simvastatin (ZOCOR) 20 MG tablet Take 20 mg by mouth every evening.    Historical Provider, MD   KNEE EXAM soft tissue tenderness over lateral joint, reduced range of motion, negative drawer sign, collateral ligaments intact, normal ipsilateral hip exam  Physical Examination: General appearance - alert, well appearing, and in no distress Mental status - alert, oriented to person, place, and time Chest - clear to auscultation, no wheezes, rales or rhonchi, symmetric air entry Heart - normal rate, regular rhythm, normal S1, S2, no murmurs, rubs, clicks or gallops Abdomen - soft, nontender, nondistended, no masses or organomegaly Neurological - alert, oriented, normal speech, no focal findings or movement disorder noted   Asessment/Plan--- Rightt knee lateral meniscal tear with possible loose body- - Plan left knee arthroscopy with meniscal debridement. Procedure risks and potential comps discussed with patient who elects to proceed. Goals are decreased pain and increased function with a high likelihood of achieving both

## 2011-08-13 NOTE — Anesthesia Postprocedure Evaluation (Signed)
  Anesthesia Post-op Note  Patient: Michael Cunningham  Procedure(s) Performed: Procedure(s) (LRB): ARTHROSCOPY KNEE (Right)  Patient Location: PACU  Anesthesia Type: General  Level of Consciousness: awake and alert   Airway and Oxygen Therapy: Patient Spontanous Breathing  Post-op Pain: mild  Post-op Assessment: Post-op Vital signs reviewed, Patient's Cardiovascular Status Stable, Respiratory Function Stable, Patent Airway and No signs of Nausea or vomiting  Post-op Vital Signs: stable  Complications: No apparent anesthesia complications

## 2011-08-13 NOTE — Anesthesia Preprocedure Evaluation (Addendum)
Anesthesia Evaluation  Patient identified by MRN, date of birth, ID band Patient awake    Reviewed: Allergy & Precautions, H&P , NPO status , Patient's Chart, lab work & pertinent test results  Airway Mallampati: II TM Distance: >3 FB Neck ROM: Full    Dental No notable dental hx.    Pulmonary neg pulmonary ROS,  breath sounds clear to auscultation  Pulmonary exam normal       Cardiovascular Exercise Tolerance: Good hypertension, Pt. on medications Rhythm:Regular Rate:Normal     Neuro/Psych negative neurological ROS  negative psych ROS   GI/Hepatic negative GI ROS, Neg liver ROS,   Endo/Other  Diabetes mellitus-, Type 2, Insulin Dependent and Oral Hypoglycemic Agents  Renal/GU negative Renal ROS  negative genitourinary   Musculoskeletal negative musculoskeletal ROS (+)   Abdominal (+) + obese,   Peds negative pediatric ROS (+)  Hematology negative hematology ROS (+)   Anesthesia Other Findings   Reproductive/Obstetrics negative OB ROS                           Anesthesia Physical Anesthesia Plan  ASA: III  Anesthesia Plan: General   Post-op Pain Management:    Induction: Intravenous  Airway Management Planned: LMA  Additional Equipment:   Intra-op Plan:   Post-operative Plan: Extubation in OR  Informed Consent: I have reviewed the patients History and Physical, chart, labs and discussed the procedure including the risks, benefits and alternatives for the proposed anesthesia with the patient or authorized representative who has indicated his/her understanding and acceptance.   Dental advisory given  Plan Discussed with: CRNA  Anesthesia Plan Comments: (Anesthesia record from [facility] reviewed. Patient had a severe headache upon emergence.  Will try Desflurane and maintain normal ventilation.)       Anesthesia Quick Evaluation

## 2011-08-13 NOTE — Brief Op Note (Signed)
08/13/2011  3:56 PM  PATIENT:  Michael Cunningham  66 y.o. male  PRE-OPERATIVE DIAGNOSIS:  right knee lateral meniscus tear  POST-OPERATIVE DIAGNOSIS:  med meniscal tear, hypertrophic synovium  PROCEDURE:  Procedure(s) (LRB): ARTHROSCOPY KNEE (Right) with meniscal debridement, plica excision and synovectomy  SURGEON:  Surgeon(s) and Role:    * Loanne Drilling, MD - Primary  PHYSICIAN ASSISTANT:   ASSISTANTS: none   ANESTHESIA:   general  EBL:   minimal  LOCAL MEDICATIONS USED:  MARCAINE     DICTATION: .Other Dictation: Dictation Number [id number]  PLAN OF CARE: Discharge to home after PACU  PATIENT DISPOSITION:  PACU - hemodynamically stable.   Michael Rankin Abbe Bula, MD    08/13/2011, 3:58 PM

## 2011-08-13 NOTE — Transfer of Care (Signed)
Immediate Anesthesia Transfer of Care Note  Patient: Michael Cunningham  Procedure(s) Performed: Procedure(s) (LRB): ARTHROSCOPY KNEE (Right)  Patient Location: PACU  Anesthesia Type: General  Level of Consciousness: sedated, patient cooperative and responds to stimulaton  Airway & Oxygen Therapy: Patient Spontanous Breathing and Patient connected to face mask oxgen  Post-op Assessment: Report given to PACU RN and Post -op Vital signs reviewed and stable  Post vital signs: Reviewed and stable  Complications: No apparent anesthesia complications

## 2011-08-13 NOTE — Interval H&P Note (Signed)
History and Physical Interval Note:  08/13/2011 2:41 PM  Michael Cunningham  has presented today for surgery, with the diagnosis of right knee lateral meniscus tear  The various methods of treatment have been discussed with the patient and family. After consideration of risks, benefits and other options for treatment, the patient has consented to  Procedure(s) (LRB): ARTHROSCOPY KNEE (Right) as a surgical intervention .  [name] history has been reviewed, patient examined, no change in status, stable for surgery.  I have reviewed the patients' chart and labs.  Questions were answered to the patient's satisfaction.     Loanne Drilling

## 2011-08-14 NOTE — Op Note (Signed)
NAMEDANARIUS, MCCONATHY                ACCOUNT NO.:  [id number]  MEDICAL RECORD NO.:  [id number]  LOCATION:  [facility]                         FACILITY:  [facility]  PHYSICIAN:  Ollen Gross, M.D.    DATE OF BIRTH:  1945/09/24  DATE OF PROCEDURE:  08/13/2011 DATE OF DISCHARGE:  08/13/2011                              OPERATIVE REPORT   PREOPERATIVE DIAGNOSIS:  Right knee possible lateral meniscal tear.  POSTOPERATIVE DIAGNOSIS:  Right knee medial meniscal tear, large synovial plica, right lateral knee and hypertrophic synovitis, right knee.  PROCEDURE:  Right knee arthroscopy with medial meniscal debridement, plica excision, and synovectomy.  SURGEON:  Ollen Gross, M.D.  ASSISTANT:  No assistant.  ANESTHESIA:  General.  ESTIMATED BLOOD LOSS:  Minimal.  DRAINS:  None.  COMPLICATIONS:  None.  CONDITION:  Stable to Recovery.  BRIEF CLINICAL NOTE:  Mr. Vandevoorde is a 66 year old male with several- month history significant for right knee pain and mechanical symptoms. He had an arthroscopy done by Dr. August Saucer in November, 2012 and medial meniscal debridement.  He started having increasing mechanical symptoms and pain postoperatively.  I saw him in second opinion.  Exam and history suggested a lateral meniscal tear.  MRI showed what appeared to be a loose body or hypertrophic tissue in the lateral gutter as well as a possible lateral meniscal tear.  Given the significant pain and mechanical symptoms, he opted for arthroscopy and debridement.  PROCEDURE IN DETAIL:  After successful administration of general anesthetic, a tourniquet was placed high on his right thigh and his right lower extremity was prepped and draped in the usual sterile fashion.  Standard superomedial and inferolateral incisions made. Inflow cannula passed superomedial.  Camera passed inferolateral. Arthroscopic visualization proceeds.  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  No full-thickness  defects or cartilage tears.  There is a large amount of hypertrophic synovium present in the suprapatellar area.  Medial and lateral gutters were visualized.  There is a large plica superolaterally, which also is infolding into the lateral gutter and a large amount of hypertrophic synovium in the lateral gutter.  There was no distinct mass, but this easily could have been what was visualized on the MRI.  Medial gutters visualized and the tissue appears more normal.  Flexion valgus force was applied to the knee in the medial compartment centered.  There were some grade 2 chondromalacia, medial femoral condyle and tibial plateau, but no full-thickness defects or unstable chondral defects.  Spinal anesthesia used to localize the inferomedial portal, small incision made and dilator placed.  There was a tear at the posterior root of the medial meniscus.  It was not detached.  It is debrided back to a stable base with baskets and a 4.2 mm shaver and sealed off with the ArthroCare device.  It is probed and then found to be stable.  Intercondylar notch was visualized.  The ACL was normal.  There was a lot of hypertrophic tissue present around the fat pad and this tissue has beefy red inflamed appearance.  Lateral compartment was entered.  No evidence of a lateral meniscal tear or chondral defect.  Combination shaver and ArthroCare was used to  remove the hypertrophic tissue around the fat pad.  No discrete masses were found.  I then created superolateral portal and used the ArthroCare to debride all the hypertrophic synovium in the lateral gutter and to remove that lateral plica.  The synovium was also debrided in the suprapatellar area and in the medial gutter.  Joints again inspected.  No other tears, defects, or loose bodies or hypertrophic synovial folds were found.  The arthroscopic equipments were removed from the inferior portals, which were closed with interrupted 4-0 nylon, as is the  superolateral portal.  20 mL of 4% Marcaine with epi injected with inflow cannula, and then that is removed and that portal closed with nylon.  Incisions cleaned and dried, and a bulky sterile dressing applied.  He was then awakened and transported to recovery in stable condition.     Ollen Gross, M.D.     FA/MEDQ  D:  08/13/2011  T:  08/14/2011  Job:  [id number]

## 2011-08-16 ENCOUNTER — Encounter (HOSPITAL_COMMUNITY): Payer: Self-pay | Admitting: Orthopedic Surgery

## 2011-08-19 DIAGNOSIS — M23305 Other meniscus derangements, unspecified medial meniscus, unspecified knee: Secondary | ICD-10-CM | POA: Diagnosis not present

## 2011-09-09 DIAGNOSIS — M25569 Pain in unspecified knee: Secondary | ICD-10-CM | POA: Diagnosis not present

## 2011-09-17 DIAGNOSIS — M25569 Pain in unspecified knee: Secondary | ICD-10-CM | POA: Diagnosis not present

## 2011-09-21 ENCOUNTER — Ambulatory Visit: Payer: Medicare Other | Admitting: Physical Therapy

## 2011-09-21 DIAGNOSIS — H40059 Ocular hypertension, unspecified eye: Secondary | ICD-10-CM | POA: Diagnosis not present

## 2011-09-21 DIAGNOSIS — H40019 Open angle with borderline findings, low risk, unspecified eye: Secondary | ICD-10-CM | POA: Diagnosis not present

## 2011-09-21 DIAGNOSIS — H52209 Unspecified astigmatism, unspecified eye: Secondary | ICD-10-CM | POA: Diagnosis not present

## 2011-09-21 DIAGNOSIS — E119 Type 2 diabetes mellitus without complications: Secondary | ICD-10-CM | POA: Diagnosis not present

## 2011-09-22 ENCOUNTER — Ambulatory Visit: Payer: Medicare Other | Attending: Orthopedic Surgery | Admitting: Physical Therapy

## 2011-09-22 DIAGNOSIS — IMO0001 Reserved for inherently not codable concepts without codable children: Secondary | ICD-10-CM | POA: Insufficient documentation

## 2011-09-22 DIAGNOSIS — R262 Difficulty in walking, not elsewhere classified: Secondary | ICD-10-CM | POA: Diagnosis not present

## 2011-09-22 DIAGNOSIS — M25569 Pain in unspecified knee: Secondary | ICD-10-CM | POA: Diagnosis not present

## 2011-09-22 DIAGNOSIS — M25669 Stiffness of unspecified knee, not elsewhere classified: Secondary | ICD-10-CM | POA: Diagnosis not present

## 2011-09-23 ENCOUNTER — Ambulatory Visit: Payer: Medicare Other | Admitting: Physical Therapy

## 2011-09-23 DIAGNOSIS — R262 Difficulty in walking, not elsewhere classified: Secondary | ICD-10-CM | POA: Diagnosis not present

## 2011-09-23 DIAGNOSIS — M25569 Pain in unspecified knee: Secondary | ICD-10-CM | POA: Diagnosis not present

## 2011-09-23 DIAGNOSIS — IMO0001 Reserved for inherently not codable concepts without codable children: Secondary | ICD-10-CM | POA: Diagnosis not present

## 2011-09-23 DIAGNOSIS — M25669 Stiffness of unspecified knee, not elsewhere classified: Secondary | ICD-10-CM | POA: Diagnosis not present

## 2011-09-27 ENCOUNTER — Ambulatory Visit: Payer: Medicare Other | Admitting: Physical Therapy

## 2011-09-27 DIAGNOSIS — M25669 Stiffness of unspecified knee, not elsewhere classified: Secondary | ICD-10-CM | POA: Diagnosis not present

## 2011-09-27 DIAGNOSIS — M25569 Pain in unspecified knee: Secondary | ICD-10-CM | POA: Diagnosis not present

## 2011-09-27 DIAGNOSIS — IMO0001 Reserved for inherently not codable concepts without codable children: Secondary | ICD-10-CM | POA: Diagnosis not present

## 2011-09-27 DIAGNOSIS — R262 Difficulty in walking, not elsewhere classified: Secondary | ICD-10-CM | POA: Diagnosis not present

## 2011-09-28 ENCOUNTER — Ambulatory Visit: Payer: Medicare Other | Admitting: Physical Therapy

## 2011-09-28 DIAGNOSIS — M25569 Pain in unspecified knee: Secondary | ICD-10-CM | POA: Diagnosis not present

## 2011-09-28 DIAGNOSIS — IMO0001 Reserved for inherently not codable concepts without codable children: Secondary | ICD-10-CM | POA: Diagnosis not present

## 2011-09-28 DIAGNOSIS — M25669 Stiffness of unspecified knee, not elsewhere classified: Secondary | ICD-10-CM | POA: Diagnosis not present

## 2011-09-28 DIAGNOSIS — R262 Difficulty in walking, not elsewhere classified: Secondary | ICD-10-CM | POA: Diagnosis not present

## 2011-10-05 ENCOUNTER — Ambulatory Visit: Payer: Medicare Other | Admitting: Physical Therapy

## 2011-10-05 DIAGNOSIS — M25669 Stiffness of unspecified knee, not elsewhere classified: Secondary | ICD-10-CM | POA: Diagnosis not present

## 2011-10-05 DIAGNOSIS — R262 Difficulty in walking, not elsewhere classified: Secondary | ICD-10-CM | POA: Diagnosis not present

## 2011-10-05 DIAGNOSIS — M25569 Pain in unspecified knee: Secondary | ICD-10-CM | POA: Diagnosis not present

## 2011-10-05 DIAGNOSIS — IMO0001 Reserved for inherently not codable concepts without codable children: Secondary | ICD-10-CM | POA: Diagnosis not present

## 2011-10-07 ENCOUNTER — Ambulatory Visit: Payer: Medicare Other | Admitting: Physical Therapy

## 2011-10-07 DIAGNOSIS — M25669 Stiffness of unspecified knee, not elsewhere classified: Secondary | ICD-10-CM | POA: Diagnosis not present

## 2011-10-07 DIAGNOSIS — M25569 Pain in unspecified knee: Secondary | ICD-10-CM | POA: Diagnosis not present

## 2011-10-07 DIAGNOSIS — IMO0001 Reserved for inherently not codable concepts without codable children: Secondary | ICD-10-CM | POA: Diagnosis not present

## 2011-10-07 DIAGNOSIS — R262 Difficulty in walking, not elsewhere classified: Secondary | ICD-10-CM | POA: Diagnosis not present

## 2011-10-12 ENCOUNTER — Ambulatory Visit: Payer: Medicare Other | Admitting: Physical Therapy

## 2011-10-12 DIAGNOSIS — M25669 Stiffness of unspecified knee, not elsewhere classified: Secondary | ICD-10-CM | POA: Diagnosis not present

## 2011-10-12 DIAGNOSIS — IMO0001 Reserved for inherently not codable concepts without codable children: Secondary | ICD-10-CM | POA: Diagnosis not present

## 2011-10-12 DIAGNOSIS — R262 Difficulty in walking, not elsewhere classified: Secondary | ICD-10-CM | POA: Diagnosis not present

## 2011-10-12 DIAGNOSIS — M25569 Pain in unspecified knee: Secondary | ICD-10-CM | POA: Diagnosis not present

## 2011-10-14 ENCOUNTER — Ambulatory Visit: Payer: Medicare Other | Attending: Orthopedic Surgery | Admitting: Physical Therapy

## 2011-10-14 DIAGNOSIS — R262 Difficulty in walking, not elsewhere classified: Secondary | ICD-10-CM | POA: Diagnosis not present

## 2011-10-14 DIAGNOSIS — IMO0001 Reserved for inherently not codable concepts without codable children: Secondary | ICD-10-CM | POA: Insufficient documentation

## 2011-10-14 DIAGNOSIS — M25569 Pain in unspecified knee: Secondary | ICD-10-CM | POA: Diagnosis not present

## 2011-10-14 DIAGNOSIS — M25669 Stiffness of unspecified knee, not elsewhere classified: Secondary | ICD-10-CM | POA: Diagnosis not present

## 2011-10-19 ENCOUNTER — Ambulatory Visit: Payer: Medicare Other | Admitting: Physical Therapy

## 2011-10-21 ENCOUNTER — Ambulatory Visit: Payer: Medicare Other | Admitting: Physical Therapy

## 2011-10-25 ENCOUNTER — Ambulatory Visit: Payer: Medicare Other | Admitting: Physical Therapy

## 2011-10-26 ENCOUNTER — Ambulatory Visit: Payer: Medicare Other | Admitting: Physical Therapy

## 2011-10-28 ENCOUNTER — Ambulatory Visit: Payer: Medicare Other | Admitting: Physical Therapy

## 2011-10-28 ENCOUNTER — Encounter: Payer: Medicare Other | Admitting: Physical Therapy

## 2011-11-04 ENCOUNTER — Ambulatory Visit: Payer: Medicare Other | Admitting: Physical Therapy

## 2011-11-09 ENCOUNTER — Ambulatory Visit: Payer: Medicare Other | Admitting: Physical Therapy

## 2011-11-11 ENCOUNTER — Ambulatory Visit: Payer: Medicare Other | Admitting: Physical Therapy

## 2011-11-16 ENCOUNTER — Ambulatory Visit: Payer: Medicare Other | Attending: Orthopedic Surgery | Admitting: Physical Therapy

## 2011-11-16 DIAGNOSIS — R262 Difficulty in walking, not elsewhere classified: Secondary | ICD-10-CM | POA: Diagnosis not present

## 2011-11-16 DIAGNOSIS — M25569 Pain in unspecified knee: Secondary | ICD-10-CM | POA: Insufficient documentation

## 2011-11-16 DIAGNOSIS — IMO0001 Reserved for inherently not codable concepts without codable children: Secondary | ICD-10-CM | POA: Insufficient documentation

## 2011-11-16 DIAGNOSIS — M25669 Stiffness of unspecified knee, not elsewhere classified: Secondary | ICD-10-CM | POA: Diagnosis not present

## 2011-11-17 ENCOUNTER — Ambulatory Visit: Payer: Medicare Other | Admitting: Physical Therapy

## 2011-11-17 DIAGNOSIS — IMO0001 Reserved for inherently not codable concepts without codable children: Secondary | ICD-10-CM | POA: Diagnosis not present

## 2011-11-17 DIAGNOSIS — R262 Difficulty in walking, not elsewhere classified: Secondary | ICD-10-CM | POA: Diagnosis not present

## 2011-11-17 DIAGNOSIS — M25669 Stiffness of unspecified knee, not elsewhere classified: Secondary | ICD-10-CM | POA: Diagnosis not present

## 2011-11-17 DIAGNOSIS — M25569 Pain in unspecified knee: Secondary | ICD-10-CM | POA: Diagnosis not present

## 2011-11-18 ENCOUNTER — Ambulatory Visit: Payer: Medicare Other | Admitting: Physical Therapy

## 2011-11-23 ENCOUNTER — Ambulatory Visit: Payer: Medicare Other | Admitting: Physical Therapy

## 2011-11-23 DIAGNOSIS — M25569 Pain in unspecified knee: Secondary | ICD-10-CM | POA: Diagnosis not present

## 2011-11-23 DIAGNOSIS — IMO0001 Reserved for inherently not codable concepts without codable children: Secondary | ICD-10-CM | POA: Diagnosis not present

## 2011-11-23 DIAGNOSIS — M25669 Stiffness of unspecified knee, not elsewhere classified: Secondary | ICD-10-CM | POA: Diagnosis not present

## 2011-11-23 DIAGNOSIS — R262 Difficulty in walking, not elsewhere classified: Secondary | ICD-10-CM | POA: Diagnosis not present

## 2011-11-25 ENCOUNTER — Ambulatory Visit: Payer: Medicare Other | Admitting: Physical Therapy

## 2011-11-25 DIAGNOSIS — M25569 Pain in unspecified knee: Secondary | ICD-10-CM | POA: Diagnosis not present

## 2011-11-25 DIAGNOSIS — M25669 Stiffness of unspecified knee, not elsewhere classified: Secondary | ICD-10-CM | POA: Diagnosis not present

## 2011-11-25 DIAGNOSIS — IMO0001 Reserved for inherently not codable concepts without codable children: Secondary | ICD-10-CM | POA: Diagnosis not present

## 2011-11-25 DIAGNOSIS — R262 Difficulty in walking, not elsewhere classified: Secondary | ICD-10-CM | POA: Diagnosis not present

## 2011-11-30 ENCOUNTER — Ambulatory Visit: Payer: Medicare Other | Admitting: Physical Therapy

## 2011-11-30 DIAGNOSIS — M25569 Pain in unspecified knee: Secondary | ICD-10-CM | POA: Diagnosis not present

## 2011-11-30 DIAGNOSIS — M25669 Stiffness of unspecified knee, not elsewhere classified: Secondary | ICD-10-CM | POA: Diagnosis not present

## 2011-11-30 DIAGNOSIS — R262 Difficulty in walking, not elsewhere classified: Secondary | ICD-10-CM | POA: Diagnosis not present

## 2011-11-30 DIAGNOSIS — IMO0001 Reserved for inherently not codable concepts without codable children: Secondary | ICD-10-CM | POA: Diagnosis not present

## 2011-12-02 DIAGNOSIS — Z9889 Other specified postprocedural states: Secondary | ICD-10-CM | POA: Diagnosis not present

## 2011-12-03 DIAGNOSIS — E785 Hyperlipidemia, unspecified: Secondary | ICD-10-CM | POA: Diagnosis not present

## 2011-12-03 DIAGNOSIS — Z23 Encounter for immunization: Secondary | ICD-10-CM | POA: Diagnosis not present

## 2011-12-03 DIAGNOSIS — I10 Essential (primary) hypertension: Secondary | ICD-10-CM | POA: Diagnosis not present

## 2011-12-03 DIAGNOSIS — E119 Type 2 diabetes mellitus without complications: Secondary | ICD-10-CM | POA: Diagnosis not present

## 2011-12-03 DIAGNOSIS — Z0000 Encounter for general adult medical examination without abnormal findings: Secondary | ICD-10-CM | POA: Diagnosis not present

## 2012-06-13 DIAGNOSIS — E119 Type 2 diabetes mellitus without complications: Secondary | ICD-10-CM | POA: Diagnosis not present

## 2012-06-13 DIAGNOSIS — I10 Essential (primary) hypertension: Secondary | ICD-10-CM | POA: Diagnosis not present

## 2012-06-13 DIAGNOSIS — E785 Hyperlipidemia, unspecified: Secondary | ICD-10-CM | POA: Diagnosis not present

## 2012-07-31 DIAGNOSIS — M545 Low back pain, unspecified: Secondary | ICD-10-CM | POA: Diagnosis not present

## 2012-07-31 DIAGNOSIS — E119 Type 2 diabetes mellitus without complications: Secondary | ICD-10-CM | POA: Diagnosis not present

## 2012-07-31 DIAGNOSIS — R300 Dysuria: Secondary | ICD-10-CM | POA: Diagnosis not present

## 2012-09-25 DIAGNOSIS — H26109 Unspecified traumatic cataract, unspecified eye: Secondary | ICD-10-CM | POA: Diagnosis not present

## 2012-09-25 DIAGNOSIS — H40039 Anatomical narrow angle, unspecified eye: Secondary | ICD-10-CM | POA: Diagnosis not present

## 2012-09-25 DIAGNOSIS — H40059 Ocular hypertension, unspecified eye: Secondary | ICD-10-CM | POA: Diagnosis not present

## 2012-09-25 DIAGNOSIS — E119 Type 2 diabetes mellitus without complications: Secondary | ICD-10-CM | POA: Diagnosis not present

## 2012-12-12 DIAGNOSIS — Z23 Encounter for immunization: Secondary | ICD-10-CM | POA: Diagnosis not present

## 2013-03-01 DIAGNOSIS — J019 Acute sinusitis, unspecified: Secondary | ICD-10-CM | POA: Diagnosis not present

## 2013-03-15 ENCOUNTER — Emergency Department (HOSPITAL_BASED_OUTPATIENT_CLINIC_OR_DEPARTMENT_OTHER)
Admission: EM | Admit: 2013-03-15 | Discharge: 2013-03-15 | Disposition: A | Discharge status: Home / Self Care | Payer: Medicare Other | Attending: Emergency Medicine | Admitting: Emergency Medicine

## 2013-03-15 ENCOUNTER — Encounter (HOSPITAL_BASED_OUTPATIENT_CLINIC_OR_DEPARTMENT_OTHER): Payer: Self-pay | Admitting: Emergency Medicine

## 2013-03-15 DIAGNOSIS — Z87891 Personal history of nicotine dependence: Secondary | ICD-10-CM | POA: Diagnosis not present

## 2013-03-15 DIAGNOSIS — Z7982 Long term (current) use of aspirin: Secondary | ICD-10-CM | POA: Diagnosis not present

## 2013-03-15 DIAGNOSIS — E119 Type 2 diabetes mellitus without complications: Secondary | ICD-10-CM | POA: Insufficient documentation

## 2013-03-15 DIAGNOSIS — R040 Epistaxis: Secondary | ICD-10-CM | POA: Insufficient documentation

## 2013-03-15 DIAGNOSIS — Z79899 Other long term (current) drug therapy: Secondary | ICD-10-CM | POA: Diagnosis not present

## 2013-03-15 DIAGNOSIS — I10 Essential (primary) hypertension: Secondary | ICD-10-CM | POA: Diagnosis not present

## 2013-03-15 DIAGNOSIS — Z794 Long term (current) use of insulin: Secondary | ICD-10-CM | POA: Insufficient documentation

## 2013-03-15 LAB — CBC WITH DIFFERENTIAL/PLATELET
Basophils Absolute: 0 10*3/uL (ref 0.0–0.1)
Basophils Relative: 1 % (ref 0–1)
EOS ABS: 0.1 10*3/uL (ref 0.0–0.7)
EOS PCT: 2 % (ref 0–5)
HCT: 43.2 % (ref 39.0–52.0)
HEMOGLOBIN: 15.2 g/dL (ref 13.0–17.0)
LYMPHS ABS: 1.6 10*3/uL (ref 0.7–4.0)
Lymphocytes Relative: 29 % (ref 12–46)
MCH: 31.2 pg (ref 26.0–34.0)
MCHC: 35.2 g/dL (ref 30.0–36.0)
MCV: 88.7 fL (ref 78.0–100.0)
MONOS PCT: 7 % (ref 3–12)
Monocytes Absolute: 0.4 10*3/uL (ref 0.1–1.0)
Neutro Abs: 3.3 10*3/uL (ref 1.7–7.7)
Neutrophils Relative %: 61 % (ref 43–77)
Platelets: 146 10*3/uL — ABNORMAL LOW (ref 150–400)
RBC: 4.87 MIL/uL (ref 4.22–5.81)
RDW: 12.6 % (ref 11.5–15.5)
WBC: 5.4 10*3/uL (ref 4.0–10.5)

## 2013-03-15 LAB — PROTIME-INR
INR: 1.05 (ref 0.00–1.49)
PROTHROMBIN TIME: 13.5 s (ref 11.6–15.2)

## 2013-03-15 LAB — APTT: aPTT: 32 seconds (ref 24–37)

## 2013-03-15 MED ORDER — PHENYLEPHRINE HCL 0.5 % NA SOLN
NASAL | Status: AC
  Filled 2013-03-15: qty 15

## 2013-03-15 NOTE — ED Notes (Signed)
Pt amb to room 11 with quick steady gait, holding a trash can in his hands, head bent forward, with blood dripping into can and onto a washcloth. Pt assisted into bed, instructed to lay back, direct pressure held to bridge of nose by this rn.

## 2013-03-15 NOTE — ED Provider Notes (Signed)
CSN: [id number]     Arrival date & time 03/15/13  0803 History   First MD Initiated Contact with Patient 03/15/13 [phone]     Chief Complaint  Patient presents with  . Epistaxis   (Consider location/radiation/quality/duration/timing/severity/associated sxs/prior Treatment) HPI Comments: Patient presents here with complaints of nosebleed. He was taking a shower and blew his nose and began bleeding. It has been persistent for 1 hour and unrelieved with direct pressure. He has no history of nosebleeds and is not taking any anticoagulants the exception of a baby aspirin.  Patient is a 68 y.o. male presenting with nosebleeds. The history is provided by the patient.  Epistaxis Location:  L nare Severity:  Moderate Duration:  1 hour Timing:  Constant Progression:  Unchanged Chronicity:  New Context: not anticoagulants and not trauma   Relieved by:  Nothing Worsened by:  Nothing tried Ineffective treatments:  None tried Associated symptoms: no congestion, no cough and no fever     Past Medical History  Diagnosis Date  . Hypertension   . Complication of anesthesia 08-10-11    11'12 Surgery with excrutiating "head about to explode" upon awakening  -lasted several hours postop  . Diabetes mellitus 08-10-11    dx. 14 yrs ago, Insulin started 8'12, diet only previous several yrs.  . Multiple lipomas 08-10-11    hx. mulitple and some remains -arms, legs   Past Surgical History  Procedure Laterality Date  . Knee arthroscopy  08-10-11    01-29-11 Right knee scope(cyst rem., debridement, meniscal tear)-Dr. Marlou Sa  . Lipoma excision  08-10-11    multiple(arms), legs-remains with some.  . Knee arthroscopy  08/13/2011    Procedure: ARTHROSCOPY KNEE;  Surgeon: Gearlean Alf, MD;  Location: [facility];  Service: Orthopedics;  Laterality: Right;  medial  meniscal debridement, excision of plica, synovectomy   No family history on file. History  Substance Use Topics  . Smoking status: Former Smoker   Types: Cigarettes    Quit date: 06/14/1967  . Smokeless tobacco: Never Used  . Alcohol Use: No    Review of Systems  Constitutional: Negative for fever.  HENT: Positive for nosebleeds. Negative for congestion.   Respiratory: Negative for cough.   All other systems reviewed and are negative.    Allergies  Ciprofloxacin; Lipitor; Lisinopril; and Metformin and related  Home Medications   Current Outpatient Rx  Name  Route  Sig  Dispense  Refill  . Ibuprofen (ADVIL) 200 MG CAPS   Oral   Take by mouth.         [name] acetaminophen (TYLENOL) 500 MG tablet   Oral   Take 1,000 mg by mouth every 6 (six) hours as needed. For pain         . aspirin EC 81 MG tablet   Oral   Take 81 mg by mouth daily with breakfast.          . glipiZIDE (GLUCOTROL XL) 10 MG 24 hr tablet   Oral   Take 10 mg by mouth 2 (two) times daily.         . insulin detemir (LEVEMIR) 100 UNIT/ML injection   Subcutaneous   Inject 90 Units into the skin at bedtime.   10 mL   0     This evening base Levemir dosage on CBGs.  If they ...   . losartan (COZAAR) 50 MG tablet   Oral   Take 50 mg by mouth daily with breakfast.          .  Multiple Vitamins-Minerals (CENTRUM SILVER ULTRA MENS PO)   Oral   Take 1 tablet by mouth daily.         . simvastatin (ZOCOR) 20 MG tablet   Oral   Take 20 mg by mouth every evening.          BP 171/84  Pulse 86  Resp 18  Ht 5' 9.5" (1.765 m)  Wt 230 lb (104.327 kg)  BMI 33.49 kg/m2  SpO2 97% Physical Exam  Nursing note and vitals reviewed. Constitutional: He is oriented to person, place, and time. He appears well-developed and well-nourished. No distress.  HENT:  Head: Normocephalic and atraumatic.  Mouth/Throat: Oropharynx is clear and moist.  Bleeding is coming from the left nare. The site of the bleeding is not visualized.  Neck: Normal range of motion. Neck supple.  Neurological: He is alert and oriented to person, place, and time.  Skin: Skin is  warm and dry. He is not diaphoretic.    ED Course  Procedures (including critical care time) Labs Review Labs Reviewed - No data to display Imaging Review No results found.     MDM  No diagnosis found. Patient presents here with a nosebleed that began this morning while in the shower. There is no injury no trauma. It was packed with a Neo-Synephrine and Vaseline gauze with good results. The patient tolerated this well. The laboratory studies revealed no drop in hemoglobin and platelet count is adequate. His coag studies are unremarkable. He will be discharged with instructions to return in the next 12-24 hours for packing removal.    Veryl Speak, MD 03/15/13 [phone]

## 2013-03-15 NOTE — Discharge Instructions (Signed)
Return to the emergency department in the next 12-24 hours for packing removal. If your nose begins to bleed before that time, apply direct pressure for 15 minutes. If it continues to bleed at that point return to the emergency department for reevaluation.   Nosebleed Nosebleeds can be caused by many conditions including trauma, infections, polyps, foreign bodies, dry mucous membranes or climate, medications and air conditioning. Most nosebleeds occur in the front of the nose. It is because of this location that most nosebleeds can be controlled by pinching the nostrils gently and continuously. Do this for at least 10 to 20 minutes. The reason for this long continuous pressure is that you must hold it long enough for the blood to clot. If during that 10 to 20 minute time period, pressure is released, the process may have to be started again. The nosebleed may stop by itself, quit with pressure, need concentrated heating (cautery) or stop with pressure from packing. HOME CARE INSTRUCTIONS   If your nose was packed, try to maintain the pack inside until your caregiver removes it. If a gauze pack was used and it starts to fall out, gently replace or cut the end off. Do not cut if a balloon catheter was used to pack the nose. Otherwise, do not remove unless instructed.  Avoid blowing your nose for 12 hours after treatment. This could dislodge the pack or clot and start bleeding again.  If the bleeding starts again, sit up and bending forward, gently pinch the front half of your nose continuously for 20 minutes.  If bleeding was caused by dry mucous membranes, cover the inside of your nose every morning with a petroleum or antibiotic ointment. Use your little fingertip as an applicator. Do this as needed during dry weather. This will keep the mucous membranes moist and allow them to heal.  Maintain humidity in your home by using less air conditioning or using a humidifier.  Do not use aspirin or  medications which make bleeding more likely. Your caregiver can give you recommendations on this.  Resume normal activities as able but try to avoid straining, lifting or bending at the waist for several days.  If the nosebleeds become recurrent and the cause is unknown, your caregiver may suggest laboratory tests. SEEK IMMEDIATE MEDICAL CARE IF:   Bleeding recurs and cannot be controlled.  There is unusual bleeding from or bruising on other parts of the body.  You have a fever.  Nosebleeds continue.  There is any worsening of the condition which originally brought you in.  You become lightheaded, feel faint, become sweaty or vomit blood. MAKE SURE YOU:   Understand these instructions.  Will watch your condition.  Will get help right away if you are not doing well or get worse. Document Released: 12/09/2004 Document Revised: 05/24/2011 Document Reviewed: 01/31/2009 [facility] Patient Information 2014 [place], [place].

## 2013-03-15 NOTE — ED Notes (Signed)
Pt wife calls out to this rn "it's gushing out of the right side!" small amt of brb noted oozing out of right nares, pressure held until oozing stopped, pt instructed to hold pressure to bridge of nose.

## 2013-03-15 NOTE — ED Notes (Signed)
Dr. Stark Jock assisted with packing left nares. Pt tolerated well, 2x2 secured with tape to hold dressing in place.

## 2013-03-15 NOTE — ED Notes (Signed)
Pt cont coughing and clearing throat, encouraged not to cough or clear throat. Pt holding pressure to bridge of nose, no active bleeding noted.

## 2013-03-15 NOTE — ED Notes (Signed)
Pt c/o nosebleed x 1 hr. Pt has been taking advil daily x 2 months.

## 2013-03-15 NOTE — ED Notes (Signed)
Pt coughing and clearing his throat, encouraged not to cough or clear throat.

## 2013-03-15 NOTE — ED Notes (Signed)
Direct pressure cont held by this rn, dr. Stark Jock at bedside, supplies gathered.

## 2013-03-16 ENCOUNTER — Emergency Department (HOSPITAL_BASED_OUTPATIENT_CLINIC_OR_DEPARTMENT_OTHER)
Admission: EM | Admit: 2013-03-16 | Discharge: 2013-03-16 | Disposition: A | Discharge status: Home / Self Care | Payer: Medicare Other | Attending: Emergency Medicine | Admitting: Emergency Medicine

## 2013-03-16 ENCOUNTER — Encounter (HOSPITAL_BASED_OUTPATIENT_CLINIC_OR_DEPARTMENT_OTHER): Payer: Self-pay | Admitting: Emergency Medicine

## 2013-03-16 DIAGNOSIS — Z79899 Other long term (current) drug therapy: Secondary | ICD-10-CM | POA: Insufficient documentation

## 2013-03-16 DIAGNOSIS — Z7982 Long term (current) use of aspirin: Secondary | ICD-10-CM | POA: Diagnosis not present

## 2013-03-16 DIAGNOSIS — E119 Type 2 diabetes mellitus without complications: Secondary | ICD-10-CM | POA: Insufficient documentation

## 2013-03-16 DIAGNOSIS — I10 Essential (primary) hypertension: Secondary | ICD-10-CM | POA: Diagnosis not present

## 2013-03-16 DIAGNOSIS — R040 Epistaxis: Secondary | ICD-10-CM | POA: Insufficient documentation

## 2013-03-16 DIAGNOSIS — Z87891 Personal history of nicotine dependence: Secondary | ICD-10-CM | POA: Insufficient documentation

## 2013-03-16 DIAGNOSIS — Z872 Personal history of diseases of the skin and subcutaneous tissue: Secondary | ICD-10-CM | POA: Insufficient documentation

## 2013-03-16 DIAGNOSIS — Z794 Long term (current) use of insulin: Secondary | ICD-10-CM | POA: Insufficient documentation

## 2013-03-16 NOTE — Discharge Instructions (Signed)
Return to the emergency department if you experience recurrent bleeding or other new or concerning symptoms.  Neo-Synephrine one spray in each nostril every 6 hours for the next 48 hours.   Nosebleed Nosebleeds can be caused by many conditions including trauma, infections, polyps, foreign bodies, dry mucous membranes or climate, medications and air conditioning. Most nosebleeds occur in the front of the nose. It is because of this location that most nosebleeds can be controlled by pinching the nostrils gently and continuously. Do this for at least 10 to 20 minutes. The reason for this long continuous pressure is that you must hold it long enough for the blood to clot. If during that 10 to 20 minute time period, pressure is released, the process may have to be started again. The nosebleed may stop by itself, quit with pressure, need concentrated heating (cautery) or stop with pressure from packing. HOME CARE INSTRUCTIONS   If your nose was packed, try to maintain the pack inside until your caregiver removes it. If a gauze pack was used and it starts to fall out, gently replace or cut the end off. Do not cut if a balloon catheter was used to pack the nose. Otherwise, do not remove unless instructed.  Avoid blowing your nose for 12 hours after treatment. This could dislodge the pack or clot and start bleeding again.  If the bleeding starts again, sit up and bending forward, gently pinch the front half of your nose continuously for 20 minutes.  If bleeding was caused by dry mucous membranes, cover the inside of your nose every morning with a petroleum or antibiotic ointment. Use your little fingertip as an applicator. Do this as needed during dry weather. This will keep the mucous membranes moist and allow them to heal.  Maintain humidity in your home by using less air conditioning or using a humidifier.  Do not use aspirin or medications which make bleeding more likely. Your caregiver can give you  recommendations on this.  Resume normal activities as able but try to avoid straining, lifting or bending at the waist for several days.  If the nosebleeds become recurrent and the cause is unknown, your caregiver may suggest laboratory tests. SEEK IMMEDIATE MEDICAL CARE IF:   Bleeding recurs and cannot be controlled.  There is unusual bleeding from or bruising on other parts of the body.  You have a fever.  Nosebleeds continue.  There is any worsening of the condition which originally brought you in.  You become lightheaded, feel faint, become sweaty or vomit blood. MAKE SURE YOU:   Understand these instructions.  Will watch your condition.  Will get help right away if you are not doing well or get worse. Document Released: 12/09/2004 Document Revised: 05/24/2011 Document Reviewed: 01/31/2009 [facility] Patient Information 2014 [place], [place].

## 2013-03-16 NOTE — ED Provider Notes (Signed)
CSN: [id number]     Arrival date & time 03/16/13  0654 History   First MD Initiated Contact with Patient 03/16/13 0740     Chief Complaint  Patient presents with  . packing removal    (Consider location/radiation/quality/duration/timing/severity/associated sxs/prior Treatment) HPI Comments: Patient presents for packing removal. He presented here yesterday for a post axis and I personally placed a Vaseline gauze nasal packing. He had some serous drainage through the night but no recurrent bleeding. He is currently without complaint.  The history is provided by the patient.    Past Medical History  Diagnosis Date  . Hypertension   . Complication of anesthesia 08-10-11    11'12 Surgery with excrutiating "head about to explode" upon awakening  -lasted several hours postop  . Diabetes mellitus 08-10-11    dx. 14 yrs ago, Insulin started 8'12, diet only previous several yrs.  . Multiple lipomas 08-10-11    hx. mulitple and some remains -arms, legs   Past Surgical History  Procedure Laterality Date  . Knee arthroscopy  08-10-11    01-29-11 Right knee scope(cyst rem., debridement, meniscal tear)-Dr. Marlou Sa  . Lipoma excision  08-10-11    multiple(arms), legs-remains with some.  . Knee arthroscopy  08/13/2011    Procedure: ARTHROSCOPY KNEE;  Surgeon: Gearlean Alf, MD;  Location: [facility];  Service: Orthopedics;  Laterality: Right;  medial  meniscal debridement, excision of plica, synovectomy   No family history on file. History  Substance Use Topics  . Smoking status: Former Smoker    Types: Cigarettes    Quit date: 06/14/1967  . Smokeless tobacco: Never Used  . Alcohol Use: No    Review of Systems  All other systems reviewed and are negative.    Allergies  Ciprofloxacin; Lipitor; Lisinopril; and Metformin and related  Home Medications   Current Outpatient Rx  Name  Route  Sig  Dispense  Refill  . acetaminophen (TYLENOL) 500 MG tablet   Oral   Take 1,000 mg by mouth every 6  (six) hours as needed. For pain         . aspirin EC 81 MG tablet   Oral   Take 81 mg by mouth daily with breakfast.          . glipiZIDE (GLUCOTROL XL) 10 MG 24 hr tablet   Oral   Take 10 mg by mouth 2 (two) times daily.         . Ibuprofen (ADVIL) 200 MG CAPS   Oral   Take by mouth.         . insulin detemir (LEVEMIR) 100 UNIT/ML injection   Subcutaneous   Inject 90 Units into the skin at bedtime.   10 mL   0     This evening base Levemir dosage on CBGs.  If they ...   . losartan (COZAAR) 50 MG tablet   Oral   Take 50 mg by mouth daily with breakfast.          . Multiple Vitamins-Minerals (CENTRUM SILVER ULTRA MENS PO)   Oral   Take 1 tablet by mouth daily.         . simvastatin (ZOCOR) 20 MG tablet   Oral   Take 20 mg by mouth every evening.          BP 131/90  Pulse 75  Temp(Src) 97.4 F (36.3 C) (Oral)  Resp 23  SpO2 98% Physical Exam  Nursing note and vitals reviewed. Constitutional: He is oriented to person,  place, and time. He appears well-developed and well-nourished. No distress.  HENT:  Head: Normocephalic and atraumatic.  Mouth/Throat: Oropharynx is clear and moist.  The packing was removed and the nares are clear. There is no bleeding and no abnormality noted with nasal speculum.  Neck: Normal range of motion. Neck supple.  Neurological: He is alert and oriented to person, place, and time.  Skin: Skin is warm and dry. He is not diaphoretic.    ED Course  Procedures (including critical care time) Labs Review Labs Reviewed - No data to display Imaging Review No results found.    MDM  No diagnosis found. The packing was removed and there is no recurrent bleeding. He will advised to use his Neo-Synephrine for the next 48 hours and return to the emergency department if he experiences recurrent bleeding.    Veryl Speak, MD 03/16/13 [phone]

## 2013-03-16 NOTE — ED Notes (Signed)
Pt here for nasal packing removal. Pt reports spitting up clots last night and serous drainage.

## 2013-04-10 DIAGNOSIS — E785 Hyperlipidemia, unspecified: Secondary | ICD-10-CM | POA: Diagnosis not present

## 2013-04-10 DIAGNOSIS — E119 Type 2 diabetes mellitus without complications: Secondary | ICD-10-CM | POA: Diagnosis not present

## 2013-04-10 DIAGNOSIS — I10 Essential (primary) hypertension: Secondary | ICD-10-CM | POA: Diagnosis not present

## 2013-04-24 ENCOUNTER — Emergency Department (HOSPITAL_COMMUNITY): Payer: Medicare Other

## 2013-04-24 ENCOUNTER — Encounter (HOSPITAL_COMMUNITY): Payer: Self-pay | Admitting: Emergency Medicine

## 2013-04-24 ENCOUNTER — Emergency Department (HOSPITAL_COMMUNITY)
Admission: EM | Admit: 2013-04-24 | Discharge: 2013-04-24 | Disposition: A | Discharge status: Home / Self Care | Payer: Medicare Other | Attending: Emergency Medicine | Admitting: Emergency Medicine

## 2013-04-24 DIAGNOSIS — I10 Essential (primary) hypertension: Secondary | ICD-10-CM | POA: Insufficient documentation

## 2013-04-24 DIAGNOSIS — Z79899 Other long term (current) drug therapy: Secondary | ICD-10-CM | POA: Insufficient documentation

## 2013-04-24 DIAGNOSIS — Z87891 Personal history of nicotine dependence: Secondary | ICD-10-CM | POA: Diagnosis not present

## 2013-04-24 DIAGNOSIS — N200 Calculus of kidney: Secondary | ICD-10-CM

## 2013-04-24 DIAGNOSIS — Z7982 Long term (current) use of aspirin: Secondary | ICD-10-CM | POA: Insufficient documentation

## 2013-04-24 DIAGNOSIS — N1330 Unspecified hydronephrosis: Secondary | ICD-10-CM

## 2013-04-24 DIAGNOSIS — Z794 Long term (current) use of insulin: Secondary | ICD-10-CM | POA: Diagnosis not present

## 2013-04-24 DIAGNOSIS — N201 Calculus of ureter: Secondary | ICD-10-CM | POA: Diagnosis not present

## 2013-04-24 DIAGNOSIS — E119 Type 2 diabetes mellitus without complications: Secondary | ICD-10-CM | POA: Diagnosis not present

## 2013-04-24 DIAGNOSIS — Z8719 Personal history of other diseases of the digestive system: Secondary | ICD-10-CM | POA: Insufficient documentation

## 2013-04-24 LAB — CBC WITH DIFFERENTIAL/PLATELET
BASOS PCT: 0 % (ref 0–1)
Basophils Absolute: 0 10*3/uL (ref 0.0–0.1)
EOS ABS: 0.1 10*3/uL (ref 0.0–0.7)
Eosinophils Relative: 1 % (ref 0–5)
HCT: 42.6 % (ref 39.0–52.0)
HEMOGLOBIN: 15.5 g/dL (ref 13.0–17.0)
Lymphocytes Relative: 28 % (ref 12–46)
Lymphs Abs: 2.2 10*3/uL (ref 0.7–4.0)
MCH: 32.2 pg (ref 26.0–34.0)
MCHC: 36.4 g/dL — ABNORMAL HIGH (ref 30.0–36.0)
MCV: 88.6 fL (ref 78.0–100.0)
MONO ABS: 0.5 10*3/uL (ref 0.1–1.0)
Monocytes Relative: 7 % (ref 3–12)
Neutro Abs: 4.9 10*3/uL (ref 1.7–7.7)
Neutrophils Relative %: 64 % (ref 43–77)
Platelets: 181 10*3/uL (ref 150–400)
RBC: 4.81 MIL/uL (ref 4.22–5.81)
RDW: 12.7 % (ref 11.5–15.5)
WBC: 7.6 10*3/uL (ref 4.0–10.5)

## 2013-04-24 LAB — COMPREHENSIVE METABOLIC PANEL
ALBUMIN: 4 g/dL (ref 3.5–5.2)
ALT: 29 U/L (ref 0–53)
AST: 23 U/L (ref 0–37)
Alkaline Phosphatase: 91 U/L (ref 39–117)
BILIRUBIN TOTAL: 0.5 mg/dL (ref 0.3–1.2)
BUN: 19 mg/dL (ref 6–23)
CHLORIDE: 103 meq/L (ref 96–112)
CO2: 21 mEq/L (ref 19–32)
Calcium: 8.9 mg/dL (ref 8.4–10.5)
Creatinine, Ser: 0.9 mg/dL (ref 0.50–1.35)
GFR calc Af Amer: >90 mL/min (ref >90)
GFR calc non Af Amer: 85 mL/min — ABNORMAL LOW (ref >90)
Glucose, Bld: 194 mg/dL — ABNORMAL HIGH (ref 70–99)
Potassium: 3.6 mEq/L — ABNORMAL LOW (ref 3.7–5.3)
Sodium: 141 mEq/L (ref 137–147)
TOTAL PROTEIN: 7.3 g/dL (ref 6.0–8.3)

## 2013-04-24 LAB — URINALYSIS, ROUTINE W REFLEX MICROSCOPIC
Bilirubin Urine: NEGATIVE
Ketones, ur: NEGATIVE mg/dL
Leukocytes, UA: NEGATIVE
Nitrite: NEGATIVE
PROTEIN: NEGATIVE mg/dL
Specific Gravity, Urine: 1.025 (ref 1.005–1.030)
UROBILINOGEN UA: 0.2 mg/dL (ref 0.0–1.0)
pH: 5.5 (ref 5.0–8.0)

## 2013-04-24 LAB — URINE MICROSCOPIC-ADD ON

## 2013-04-24 MED ORDER — ONDANSETRON HCL 4 MG PO TABS: 4.0000 mg | ORAL_TABLET | Freq: Three times a day (TID) | ORAL | Status: DC | PRN

## 2013-04-24 MED ORDER — HYDROMORPHONE HCL PF 1 MG/ML IJ SOLN
1.0000 mg | Freq: Once | INTRAMUSCULAR | Status: AC
  Administered 2013-04-24: 1 mg via INTRAVENOUS
  Filled 2013-04-24: qty 1

## 2013-04-24 MED ORDER — TAMSULOSIN HCL 0.4 MG PO CAPS: 0.4000 mg | ORAL_CAPSULE | Freq: Every day | ORAL | Status: DC

## 2013-04-24 MED ORDER — SODIUM CHLORIDE 0.9 % IV SOLN
Freq: Once | INTRAVENOUS | Status: AC
  Administered 2013-04-24: 06:00:00 via INTRAVENOUS

## 2013-04-24 MED ORDER — ONDANSETRON HCL 4 MG/2ML IJ SOLN
4.0000 mg | Freq: Once | INTRAMUSCULAR | Status: AC
  Administered 2013-04-24: 4 mg via INTRAVENOUS
  Filled 2013-04-24: qty 2

## 2013-04-24 MED ORDER — HYDROMORPHONE HCL PF 1 MG/ML IJ SOLN
1.0000 mg | Freq: Once | INTRAMUSCULAR | Status: AC
  Administered 2013-04-24: 1 mg via INTRAVENOUS
  Filled 2013-04-24 (×2): qty 1

## 2013-04-24 MED ORDER — KETOROLAC TROMETHAMINE 30 MG/ML IJ SOLN
30.0000 mg | Freq: Once | INTRAMUSCULAR | Status: AC
  Administered 2013-04-24: 30 mg via INTRAVENOUS
  Filled 2013-04-24: qty 1

## 2013-04-24 MED ORDER — OXYCODONE-ACETAMINOPHEN 5-325 MG PO TABS: 1.0000 | ORAL_TABLET | ORAL | Status: DC | PRN

## 2013-04-24 NOTE — ED Notes (Signed)
Patient attempting to urinate. Pain increasing 8-10/10 achy sharp.  Doctor notified.

## 2013-04-24 NOTE — ED Notes (Signed)
Patient transported to CT 

## 2013-04-24 NOTE — ED Provider Notes (Signed)
CSN: [id number]     Arrival date & time 04/24/13  0545 History   First MD Initiated Contact with Patient 04/24/13 [phone]     Chief Complaint  Patient presents with  . Abdominal Pain     (Consider location/radiation/quality/duration/timing/severity/associated sxs/prior Treatment) HPI This is a 68 year old male who will follow at 3:30 AM this morning with left lower quadrant abdominal pain radiating to the left flank.  The patient awoke to use the restroom.  He states it "felt funny" when he urinated.  The patient had nausea, sudden onset severe pain, unable to find a position of comfort.  Patient states it has been constant and at times worse. He has a history of kidney stones and states that this feels the same.  Patient also has a history of bowel obstruction.  He states however he is making normal bowel movements.  The patient has had some nausea without vomiting.  He denies any hematuria, fever, chills, myalgias.   Past Medical History  Diagnosis Date  . Hypertension   . Complication of anesthesia 08-10-11    11'12 Surgery with excrutiating "head about to explode" upon awakening  -lasted several hours postop  . Diabetes mellitus 08-10-11    dx. 14 yrs ago, Insulin started 8'12, diet only previous several yrs.  . Multiple lipomas 08-10-11    hx. mulitple and some remains -arms, legs  . SBO (small bowel obstruction)    Past Surgical History  Procedure Laterality Date  . Knee arthroscopy  08-10-11    01-29-11 Right knee scope(cyst rem., debridement, meniscal tear)-Dr. Marlou Sa  . Lipoma excision  08-10-11    multiple(arms), legs-remains with some.  . Knee arthroscopy  08/13/2011    Procedure: ARTHROSCOPY KNEE;  Surgeon: Gearlean Alf, MD;  Location: [facility];  Service: Orthopedics;  Laterality: Right;  medial  meniscal debridement, excision of plica, synovectomy   No family history on file. History  Substance Use Topics  . Smoking status: Former Smoker    Types: Cigarettes    Quit date:  06/14/1967  . Smokeless tobacco: Never Used  . Alcohol Use: No    Review of Systems  Ten systems reviewed and are negative for acute change, except as noted in the HPI.    Allergies  Ciprofloxacin; Lipitor; Lisinopril; and Metformin and related  Home Medications   Current Outpatient Rx  Name  Route  Sig  Dispense  Refill  . acetaminophen (TYLENOL) 500 MG tablet   Oral   Take 1,000 mg by mouth every 6 (six) hours as needed. For pain         . aspirin EC 81 MG tablet   Oral   Take 81 mg by mouth daily with breakfast.          . glipiZIDE (GLUCOTROL XL) 10 MG 24 hr tablet   Oral   Take 10 mg by mouth 2 (two) times daily.         . Ibuprofen (ADVIL) 200 MG CAPS   Oral   Take by mouth.         . insulin detemir (LEVEMIR) 100 UNIT/ML injection   Subcutaneous   Inject 90 Units into the skin at bedtime.   10 mL   0     This evening base Levemir dosage on CBGs.  If they ...   . losartan (COZAAR) 50 MG tablet   Oral   Take 50 mg by mouth daily with breakfast.          .  Multiple Vitamins-Minerals (CENTRUM SILVER ULTRA MENS PO)   Oral   Take 1 tablet by mouth daily.         . simvastatin (ZOCOR) 20 MG tablet   Oral   Take 20 mg by mouth every evening.          BP 184/88  Pulse 87  Temp(Src) 97.5 F (36.4 C) (Oral)  Resp 18  SpO2 95% Physical Exam Physical Exam  Nursing note and vitals reviewed. Constitutional: The patient appears very uncomfortable.  He is holding the left lower quadrant of his abdomen.  Patient is writhing on the bed. HENT:  Head: Normocephalic and atraumatic.  Eyes: Conjunctivae normal are normal. No scleral icterus.  Neck: Normal range of motion. Neck supple.  Cardiovascular: Normal rate, regular rhythm and normal heart sounds.   Pulmonary/Chest: Effort normal and breath sounds normal. No respiratory distress.  Abdominal: Soft.  Tender to palpation the left lower quadrant of the abdomen.  Positive CVA tenderness on the left  side.   Musculoskeletal: He exhibits no edema.  Neurological: He is alert.  Skin: Skin is warm and dry. He is not diaphoretic.  Psychiatric: His behavior is normal.    ED Course  Procedures (including critical care time) Labs Review Labs Reviewed  CBC WITH DIFFERENTIAL - Abnormal; Notable for the following:    MCHC 36.4 (*)    All other components within normal limits  COMPREHENSIVE METABOLIC PANEL  URINALYSIS, ROUTINE W REFLEX MICROSCOPIC   Imaging Review No results found.  EKG Interpretation   None       MDM   Final diagnoses:  None    Filed Vitals:   04/24/13 0550 04/24/13 0602 04/24/13 0740  BP: 204/95 184/88   Pulse: 87 87   Temp: 97.5 F (36.4 C)    TempSrc: Oral    Resp: 18    SpO2: 95% 95% 94%    Patient with 3 mm stone at the UV junction.  There is mild hydronephrosis.  His creatinine is normal.  There is a bit of perinephric stranding on CT scan.  I spoke with Dr. Bess Harvest who was on call for urology.  He states that as long as he is afebrile with normal creatinine and his pain is well controlled he may have an at-home trial for passage.  Discussed the lab findings and CT findings with the patient.  Currently awaiting a urine from him.  Pain is 4/10 at this time.  Pt has been diagnosed with a Kidney Stone via CT. There is no evidence of significant hydronephrosis, serum creatine WNL, vitals sign stable and the pt does not have irratractable vomiting. Pt will be dc home with pain medications & has been advised to follow up with urology.Margarita Mail, PA-C 04/24/13 2213

## 2013-04-24 NOTE — ED Notes (Signed)
MD at bedside.-Abagail @ bedside

## 2013-04-24 NOTE — ED Notes (Signed)
Pt. reports LLQ pain radiating to left lower back with nausea . No emesis or diarrhea. Denies fever or chills. No urinary discomfort.

## 2013-04-24 NOTE — Discharge Instructions (Signed)
Return to the ED immediately if you develop fever, uncontrolled pain or vomiting, or other concerns.  Diet for Kidney Stones Kidney stones are small, hard masses that form inside your kidneys. They are made up of salts and minerals and often form when high levels build up in the urine. The minerals can then start to build up, crystalize, and stick together to form stones. There are several different types of kidney stones. The following types of stones may be influenced by dietary factors:   Calcium Oxalate Stones. An oxalate is a salt found in certain foods. Within the body, calcium can combine with oxalates to form calcium oxalate stones, which can be excreted in the urine in high amounts. This is the most common type of kidney stone.  Calcium Phosphate Stones. These stones may occur when the pH of the urine becomes too high, or less acidic, from too much calcium being excreted in the urine. The pH is a measure of how acidic or basic a substance is.  Uric Acid Stones. This type of stone occurs when the pH of the urine becomes too low, or very acidic, because substances called purines build up in the urine. Purines are found in animal proteins. When the urine is highly concentrated with acid, uric acid kidney stones can form.  Other risk factors for kidney stones include genetics, environment, and being overweight. Your caregiver may ask you to follow specific diet guidelines based on the type of stone you have to lessen the chances of your body making more kidney stones.  GENERAL GUIDELINES FOR ALL TYPES OF STONES  Drink plenty of fluid. Drink 12 16 cups of fluid a day, drinking mainly water.This is the most important thing you can do to prevent the formation of future kidney stones.  Maintain a healthy weight. Your caregiver or dietitian can help you determine what a healthy weight is for you. If you are overweight, weight loss may help prevent the formation of future kidney stones.  Eat a diet  adequate in animal protein. Too much animal protein can contribute to the formation of stones. Your dietitian can help you determine how much protein you should be eating. Avoid low carbohydrate, high protein diets.  Follow a balanced eating approach. The DASH diet, which stands for "Dietary Approaches to Stop Hypertension," is an effective meal plan for reducing stone formation. This diet is high in fruits, vegetables, dairy, and whole grains and low in animal protein. Ask your caregiver or dietitian for information about the DASH diet. ADDITIONAL DIET GUIDELINES FOR CALCIUM STONES Avoid foods high in salt. This includes table salt, salt seasonings, MSG, soy sauce, cured and processed meats, salted crackers and snack foods, fast food, and canned soups and foods. Ask your caregiver or dietitian for information about reducing sodium in your diet or following the low sodium diet.  Ensure adequate calcium intake. Use the following table for calcium guidelines:  Men 71 years old and younger  1000 mg/day.  Men 55 years old and older  1500 mg/day.  Women 71 69 years old  1000 mg/day.  Women 50 years and older  1500 mg/day. Your dietitian can help you determine if you are getting enough calcium in your diet. Foods that are high in calcium include dairy products, broccoli, cheese, yogurt, and pudding. If you need to take a calcium supplement, take it only in the form of calcium citrate.  Avoid foods high in oxalate. Be sure that any supplements you take do not contain more  than 500 mg of vitamin C. Vitamin C is converted into oxalate in the body. You do not need to avoid fruits and vegetables high in vitamin C.   Grains: High-fiber or bran cereal, whole-wheat bread, grits, barley, buckwheat, amaranth, pretzels, and fruitcake.  Vegetables: Dried beans, wax beans, dark leafy greens, eggplant, leeks, okra, parsley, rutabaga, tomato paste, watercress, zucchini, and escarole.  Fruit: Dried apricots, red  currants, figs, kiwi, and rhubarb.  Meat and Meat Substitutes: Soybeans and foods made from soy (soyburger, miso), dried beans, peanut butter.  Milk: Chocolate milk mixes and soymilk.  Fats and Oils: Nuts (peanuts, almonds, pecans, cashews, hazelnuts) and nut butters, sesame seeds, and tDahini paste.  Condiments/Miscellaneous: Chocolate, carob, marmalade, poppy seeds, instant iced tea, and juice from high-oxalate fruits.  Document Released: 06/26/2010 Document Revised: 08/31/2011 Document Reviewed: 08/16/2011 [facility] Patient Information 2014 [place].  Kidney Stones Kidney stones (urolithiasis) are deposits that form inside your kidneys. The intense pain is caused by the stone moving through the urinary tract. When the stone moves, the ureter goes into spasm around the stone. The stone is usually passed in the urine.  CAUSES   A disorder that makes certain neck glands produce too much parathyroid hormone (primary hyperparathyroidism).  A buildup of uric acid crystals, similar to gout in your joints.  Narrowing (stricture) of the ureter.  A kidney obstruction present at birth (congenital obstruction).  Previous surgery on the kidney or ureters.  Numerous kidney infections. SYMPTOMS   Feeling sick to your stomach (nauseous).  Throwing up (vomiting).  Blood in the urine (hematuria).  Pain that usually spreads (radiates) to the groin.  Frequency or urgency of urination. DIAGNOSIS   Taking a history and physical exam.  Blood or urine tests.  CT scan.  Occasionally, an examination of the inside of the urinary bladder (cystoscopy) is performed. TREATMENT   Observation.  Increasing your fluid intake.  Extracorporeal shock wave lithotripsy This is a noninvasive procedure that uses shock waves to break up kidney stones.  Surgery may be needed if you have severe pain or persistent obstruction. There are various surgical procedures. Most of the procedures are  performed with the use of small instruments. Only small incisions are needed to accommodate these instruments, so recovery time is minimized. The size, location, and chemical composition are all important variables that will determine the proper choice of action for you. Talk to your health care provider to better understand your situation so that you will minimize the risk of injury to yourself and your kidney.  HOME CARE INSTRUCTIONS   Drink enough water and fluids to keep your urine clear or pale yellow. This will help you to pass the stone or stone fragments.  Strain all urine through the provided strainer. Keep all particulate matter and stones for your health care provider to see. The stone causing the pain may be as small as a grain of salt. It is very important to use the strainer each and every time you pass your urine. The collection of your stone will allow your health care provider to analyze it and verify that a stone has actually passed. The stone analysis will often identify what you can do to reduce the incidence of recurrences.  Only take over-the-counter or prescription medicines for pain, discomfort, or fever as directed by your health care provider.  Make a follow-up appointment with your health care provider as directed.  Get follow-up X-rays if required. The absence of pain does not always mean that  the stone has passed. It may have only stopped moving. If the urine remains completely obstructed, it can cause loss of kidney function or even complete destruction of the kidney. It is your responsibility to make sure X-rays and follow-ups are completed. Ultrasounds of the kidney can show blockages and the status of the kidney. Ultrasounds are not associated with any radiation and can be performed easily in a matter of minutes. SEEK MEDICAL CARE IF:  You experience pain that is progressive and unresponsive to any pain medicine you have been prescribed. SEEK IMMEDIATE MEDICAL CARE  IF:   Pain cannot be controlled with the prescribed medicine.  You have a fever or shaking chills.  The severity or intensity of pain increases over 18 hours and is not relieved by pain medicine.  You develop a new onset of abdominal pain.  You feel faint or pass out.  You are unable to urinate. MAKE SURE YOU:   Understand these instructions.  Will watch your condition.  Will get help right away if you are not doing well or get worse. Document Released: 03/01/2005 Document Revised: 11/01/2012 Document Reviewed: 08/02/2012 [facility] Patient Information 2014 [place].

## 2013-04-24 NOTE — ED Provider Notes (Signed)
Medical screening examination/treatment/procedure(s) were performed by non-physician practitioner and as supervising physician I was immediately available for consultation/collaboration.  EKG Interpretation   None        Michael Edenfield K Joliana Claflin-Rasch, MD 04/24/13 [phone]

## 2013-04-25 DIAGNOSIS — N201 Calculus of ureter: Secondary | ICD-10-CM | POA: Diagnosis not present

## 2013-04-26 ENCOUNTER — Encounter (HOSPITAL_BASED_OUTPATIENT_CLINIC_OR_DEPARTMENT_OTHER): Payer: Self-pay | Admitting: *Deleted

## 2013-04-26 ENCOUNTER — Other Ambulatory Visit: Payer: Self-pay | Admitting: Urology

## 2013-04-26 DIAGNOSIS — N201 Calculus of ureter: Secondary | ICD-10-CM | POA: Diagnosis not present

## 2013-04-26 NOTE — Progress Notes (Signed)
04/26/13 1157  OBSTRUCTIVE SLEEP APNEA  Have you ever been diagnosed with sleep apnea through a sleep study? No  Do you snore loudly (loud enough to be heard through closed doors)?  0  Do you often feel tired, fatigued, or sleepy during the daytime? 0  Has anyone observed you stop breathing during your sleep? 0  Do you have, or are you being treated for high blood pressure? 1  BMI more than 35 kg/m2? 0  Age over 69 years old? 1  Neck circumference greater than 40 cm/18 inches? 1  Gender: 1  Obstructive Sleep Apnea Score 4  Score 4 or greater  Results sent to PCP

## 2013-04-26 NOTE — Progress Notes (Addendum)
NPO AFTER MN. ARRIVE AT 1030. NEEDS ISTAT AND EKG . WILL TAKE LOSARTAN AM DOS W/ SIPS OF WATER AND IF NEEDED MAY TAKE OXYCODONE/ ZOFRAN.

## 2013-04-27 ENCOUNTER — Ambulatory Visit (HOSPITAL_BASED_OUTPATIENT_CLINIC_OR_DEPARTMENT_OTHER): Admission: RE | Admit: 2013-04-27 | Payer: Medicare Other | Source: Ambulatory Visit | Admitting: Urology

## 2013-04-27 HISTORY — DX: Personal history of other diseases of the digestive system: Z87.19

## 2013-04-27 HISTORY — DX: Calculus of ureter: N20.1

## 2013-04-27 HISTORY — DX: Diverticulosis of large intestine without perforation or abscess without bleeding: K57.30

## 2013-04-27 HISTORY — DX: Type 2 diabetes mellitus without complications: E11.9

## 2013-04-27 SURGERY — CYSTOSCOPY/RETROGRADE/URETEROSCOPY
Anesthesia: General | Laterality: Left

## 2013-05-01 ENCOUNTER — Ambulatory Visit: Payer: Medicare Other | Admitting: Family Medicine

## 2013-07-03 ENCOUNTER — Encounter: Payer: Self-pay | Admitting: Family Medicine

## 2013-07-03 ENCOUNTER — Ambulatory Visit (INDEPENDENT_AMBULATORY_CARE_PROVIDER_SITE_OTHER): Payer: Medicare Other | Admitting: Family Medicine

## 2013-07-03 VITALS — BP 134/72 | HR 75 | Temp 97.6°F | Ht 69.5 in | Wt 240.1 lb

## 2013-07-03 DIAGNOSIS — N200 Calculus of kidney: Secondary | ICD-10-CM

## 2013-07-03 DIAGNOSIS — K56609 Unspecified intestinal obstruction, unspecified as to partial versus complete obstruction: Secondary | ICD-10-CM

## 2013-07-03 DIAGNOSIS — I10 Essential (primary) hypertension: Secondary | ICD-10-CM | POA: Diagnosis not present

## 2013-07-03 DIAGNOSIS — E119 Type 2 diabetes mellitus without complications: Secondary | ICD-10-CM

## 2013-07-03 DIAGNOSIS — B059 Measles without complication: Secondary | ICD-10-CM | POA: Insufficient documentation

## 2013-07-03 DIAGNOSIS — R040 Epistaxis: Secondary | ICD-10-CM

## 2013-07-03 DIAGNOSIS — E785 Hyperlipidemia, unspecified: Secondary | ICD-10-CM

## 2013-07-03 DIAGNOSIS — B019 Varicella without complication: Secondary | ICD-10-CM | POA: Insufficient documentation

## 2013-07-03 NOTE — Progress Notes (Signed)
Patient ID: Michael Cunningham, male   DOB: [birth year], 68 y.o.   MRN: [id number] Marce Charlesworth Supan [id number] 02/11/1946 07/03/2013      Progress Note New Patient  Subjective  Chief Complaint  Chief Complaint  Patient presents with  . Establish Care    new patient    HPI  Patient is a 68 year old male in today for routine medical care. Is here today to establish care. His previous PMD has moved. He has had a difficult couple of months. He has been in the emergency room 3 times. He's been struggling with a bad episode of epistaxis, kidney stones and small bowel obstruction. Follows with urology and gastroenterology for these complaints. Blood sugars are always an issue and his most recent hemoglobin A1c was 9.4. No acute complaints today. No recent illness.Denies CP/palp/SOB/HA/congestion/fevers/GI or GU c/o. Taking meds as prescribed  Past Medical History  Diagnosis Date  . Hypertension   . Multiple lipomas     hx. mulitple and some remains -arms, legs  . Diverticulosis of colon     LEFT SIDE  . History of small bowel obstruction     X2  28 YRS AGO AND LAST ONE 06-02-2001--  RESOLVED WITHOUT SURGICAL INTERVENTION  . Type 2 diabetes mellitus   . History of kidney stones   . Left ureteral calculus   . Complication of anesthesia     11'12 Surgery with excrutiating "head about to explode" upon awakening  -lasted several hours postop  ( SURGERY DONE 08-13-2011 AT [facility] PT DID OK POST OP)  . At risk for sleep apnea     STOP-BANG =  4   SENT TO PCP 04-26-2013  . Chicken pox as a child  . Measles as a child    Past Surgical History  Procedure Laterality Date  . Lipoma excision  1975    multiple  . Knee arthroscopy  08/13/2011    Procedure: ARTHROSCOPY KNEE;  Surgeon: Gearlean Alf, MD;  Location: [facility];  Service: Orthopedics;  Laterality: Right;  medial  meniscal debridement, excision of plica, synovectomy  . Knee arthroscopy w/ meniscectomy Right 01-29-2011  . Colonoscopy w/ polypectomy   LAST ONE 2013    Family History  Problem Relation Age of Onset  . Hypertension Mother   . Cancer Mother 15    ovarian  . Heart disease Father     chf  . Heart attack Father 51  . Cancer Sister     breast- just finished her last radiation  . Heart disease Maternal Grandfather     ?  [name] Heart attack Paternal Grandfather     History   Social History  . Marital Status: Married    Spouse Name: N/A    Number of Children: N/A  . Years of Education: N/A   Occupational History  . Not on file.   Social History Main Topics  . Smoking status: Former Smoker -- 4 years    Types: Cigarettes    Quit date: 06/14/1967  . Smokeless tobacco: Never Used  . Alcohol Use: No  . Drug Use: No  . Sexual Activity: Not on file   Other Topics Concern  . Not on file   Social History Narrative  . No narrative on file    Current Outpatient Prescriptions on File Prior to Visit  Medication Sig Dispense Refill  . aspirin EC 81 MG tablet Take 81 mg by mouth daily with breakfast.       . glipiZIDE (  GLUCOTROL XL) 10 MG 24 hr tablet Take 10 mg by mouth 2 (two) times daily.      . insulin detemir (LEVEMIR) 100 UNIT/ML injection Inject 100 Units into the skin at bedtime.      [name] losartan (COZAAR) 50 MG tablet Take 50 mg by mouth daily with breakfast.       . Multiple Vitamins-Minerals (CENTRUM SILVER ULTRA MENS PO) Take 1 tablet by mouth daily.      . simvastatin (ZOCOR) 20 MG tablet Take 20 mg by mouth every evening.      . sitaGLIPtin (JANUVIA) 100 MG tablet Take 100 mg by mouth daily.      [name] acetaminophen (TYLENOL) 500 MG tablet Take 1,000 mg by mouth every 6 (six) hours as needed. For pain       No current facility-administered medications on file prior to visit.    Allergies  Allergen Reactions  . Ciprofloxacin Other (See Comments)    Abdominal pain  . Lipitor [Atorvastatin Calcium] Other (See Comments)    Severe muscle pain  . Lisinopril Cough  . Metformin And Related Diarrhea     Review of Systems  Review of Systems  Constitutional: Negative for fever, chills and malaise/fatigue.  HENT: Negative for congestion, hearing loss and nosebleeds.   Eyes: Negative for discharge.  Respiratory: Negative for cough, sputum production, shortness of breath and wheezing.   Cardiovascular: Negative for chest pain, palpitations and leg swelling.  Gastrointestinal: Negative for heartburn, nausea, vomiting, abdominal pain, diarrhea, constipation and blood in stool.  Genitourinary: Negative for dysuria, urgency, frequency and hematuria.  Musculoskeletal: Negative for back pain, falls and myalgias.  Skin: Negative for rash.  Neurological: Negative for dizziness, tremors, sensory change, focal weakness, loss of consciousness, weakness and headaches.  Endo/Heme/Allergies: Negative for polydipsia. Does not bruise/bleed easily.  Psychiatric/Behavioral: Negative for depression and suicidal ideas. The patient is not nervous/anxious and does not have insomnia.     Objective  BP 134/72  Pulse 75  Temp(Src) 97.6 F (36.4 C) (Oral)  Ht 5' 9.5" (1.765 m)  Wt 240 lb 1.3 oz (108.9 kg)  BMI 34.96 kg/m2  SpO2 96%  Physical Exam  Physical Exam  Constitutional: He is oriented to person, place, and time and well-developed, well-nourished, and in no distress. No distress.  HENT:  Head: Normocephalic and atraumatic.  Eyes: Conjunctivae are normal.  Neck: Neck supple. No thyromegaly present.  Cardiovascular: Normal rate, regular rhythm and normal heart sounds.   No murmur heard. Pulmonary/Chest: Effort normal and breath sounds normal. No respiratory distress.  Abdominal: He exhibits no distension and no mass. There is no tenderness.  Musculoskeletal: He exhibits no edema.  Neurological: He is alert and oriented to person, place, and time.  Skin: Skin is warm.  Psychiatric: Memory, affect and judgment normal.       Assessment & Plan  Hypertension Well controlled, no changes to  meds. Encouraged heart healthy diet such as the DASH diet and exercise as tolerated.   Diabetes mellitus HGB1C 9.4 in 1/15 with previous PMD. hgba1c acceptable, minimize simple carbs. Increase exercise as tolerated. Continue current meds for now and await repeat hgba1c  Other and unspecified hyperlipidemia Tolerating statin, encouraged heart healthy diet, avoid trans fats, minimize simple carbs and saturated fats. Increase exercise as tolerated. Reviewed recent labs from previous PMD, panel wnl in 1/15 except elevated Triglycerides. Repeat lipid panel  Small bowel obstruction Has had 2 separate episdoes in past which have resolved with conservative management. Avoid offending foods,  start probiotics. Do not eat large meals in late evening and consider raising head of bed.   Epistaxis Recent, only once but required trip to ED to stop the bleeding. He will report any further episodes so we can refer to ENT for further management  Recurrent kidney stones Maintains adequate hydration

## 2013-07-03 NOTE — Progress Notes (Signed)
Pre visit review using our clinic review tool, if applicable. No additional management support is needed unless otherwise documented below in the visit note. 

## 2013-07-03 NOTE — Patient Instructions (Signed)
Preventive Care for Adults, Male A healthy lifestyle and preventive care can promote health and wellness. Preventive health guidelines for men include the following key practices:  A routine yearly physical is a good way to check with your health care provider about your health and preventative screening. It is a chance to share any concerns and updates on your health and to receive a thorough exam.  Visit your dentist for a routine exam and preventative care every 6 months. Brush your teeth twice a day and floss once a day. Good oral hygiene prevents tooth decay and gum disease.  The frequency of eye exams is based on your age, health, family medical history, use of contact lenses, and other factors. Follow your health care provider's recommendations for frequency of eye exams.  Eat a healthy diet. Foods such as vegetables, fruits, whole grains, low-fat dairy products, and lean protein foods contain the nutrients you need without too many calories. Decrease your intake of foods high in solid fats, added sugars, and salt. Eat the right amount of calories for you.Get information about a proper diet from your health care provider, if necessary.  Regular physical exercise is one of the most important things you can do for your health. Most adults should get at least 150 minutes of moderate-intensity exercise (any activity that increases your heart rate and causes you to sweat) each week. In addition, most adults need muscle-strengthening exercises on 2 or more days a week.  Maintain a healthy weight. The body mass index (BMI) is a screening tool to identify possible weight problems. It provides an estimate of body fat based on height and weight. Your health care provider can find your BMI and can help you achieve or maintain a healthy weight.For adults 20 years and older:  A BMI below 18.5 is considered underweight.  A BMI of 18.5 to 24.9 is normal.  A BMI of 25 to 29.9 is considered  overweight.  A BMI of 30 and above is considered obese.  Maintain normal blood lipids and cholesterol levels by exercising and minimizing your intake of saturated fat. Eat a balanced diet with plenty of fruit and vegetables. Blood tests for lipids and cholesterol should begin at age 42 and be repeated every 5 years. If your lipid or cholesterol levels are high, you are over 50, or you are at high risk for heart disease, you may need your cholesterol levels checked more frequently.Ongoing high lipid and cholesterol levels should be treated with medicines if diet and exercise are not working.  If you smoke, find out from your health care provider how to quit. If you do not use tobacco, do not start.  Lung cancer screening is recommended for adults aged 24 80 years who are at high risk for developing lung cancer because of a history of smoking. A yearly low-dose CT scan of the lungs is recommended for people who have at least a 30-pack-year history of smoking and are a current smoker or have quit within the past 15 years. A pack year of smoking is smoking an average of 1 pack of cigarettes a day for 1 year (for example: 1 pack a day for 30 years or 2 packs a day for 15 years). Yearly screening should continue until the smoker has stopped smoking for at least 15 years. Yearly screening should be stopped for people who develop a health problem that would prevent them from having lung cancer treatment.  If you choose to drink alcohol, do not have  more than 2 drinks per day. One drink is considered to be 12 ounces (355 mL) of beer, 5 ounces (148 mL) of wine, or 1.5 ounces (44 mL) of liquor.  Avoid use of street drugs. Do not share needles with anyone. Ask for help if you need support or instructions about stopping the use of drugs.  High blood pressure causes heart disease and increases the risk of stroke. Your blood pressure should be checked at least every 1 2 years. Ongoing high blood pressure should be  treated with medicines, if weight loss and exercise are not effective.  If you are 75 69 years old, ask your health care provider if you should take aspirin to prevent heart disease.  Diabetes screening involves taking a blood sample to check your fasting blood sugar level. This should be done once every 3 years, after age 19, if you are within normal weight and without risk factors for diabetes. Testing should be considered at a younger age or be carried out more frequently if you are overweight and have at least 1 risk factor for diabetes.  Colorectal cancer can be detected and often prevented. Most routine colorectal cancer screening begins at the age of 47 and continues through age 80. However, your health care provider may recommend screening at an earlier age if you have risk factors for colon cancer. On a yearly basis, your health care provider may provide home test kits to check for hidden blood in the stool. Use of a small camera at the end of a tube to directly examine the colon (sigmoidoscopy or colonoscopy) can detect the earliest forms of colorectal cancer. Talk to your health care provider about this at age 66, when routine screening begins. Direct exam of the colon should be repeated every 5 10 years through age 19, unless early forms of precancerous polyps or small growths are found.  People who are at an increased risk for hepatitis B should be screened for this virus. You are considered at high risk for hepatitis B if:  You were born in a country where hepatitis B occurs often. Talk with your health care provider about which countries are considered high-risk.  Your parents were born in a high-risk country and you have not received a shot to protect against hepatitis B (hepatitis B vaccine).  You have HIV or AIDS.  You use needles to inject street drugs.  You live with, or have sex with, someone who has hepatitis B.  You are a man who has sex with other men (MSM).  You get  hemodialysis treatment.  You take certain medicines for conditions such as cancer, organ transplantation, and autoimmune conditions.  Hepatitis C blood testing is recommended for all people born from [birth year] through 1965 and any individual with known risks for hepatitis C.  Practice safe sex. Use condoms and avoid high-risk sexual practices to reduce the spread of sexually transmitted infections (STIs). STIs include gonorrhea, chlamydia, syphilis, trichomonas, herpes, HPV, and human immunodeficiency virus (HIV). Herpes, HIV, and HPV are viral illnesses that have no cure. They can result in disability, cancer, and death.  A one-time screening for abdominal aortic aneurysm (AAA) and surgical repair of large AAAs by ultrasound are recommended for men ages 94 to 74 years who are current or former smokers.  Healthy men should no longer receive prostate-specific antigen (PSA) blood tests as part of routine cancer screening. Talk with your health care provider about prostate cancer screening.  Testicular cancer screening is not recommended  for adult males who have no symptoms. Screening includes self-exam, a health care provider exam, and other screening tests. Consult with your health care provider about any symptoms you have or any concerns you have about testicular cancer.  Use sunscreen. Apply sunscreen liberally and repeatedly throughout the day. You should seek shade when your shadow is shorter than you. Protect yourself by wearing long sleeves, pants, a wide-brimmed hat, and sunglasses year round, whenever you are outdoors.  Once a month, do a whole-body skin exam, using a mirror to look at the skin on your back. Tell your health care provider about new moles, moles that have irregular borders, moles that are larger than a pencil eraser, or moles that have changed in shape or color.  Stay current with required vaccines (immunizations).  Influenza vaccine. All adults should be immunized every  year.  Tetanus, diphtheria, and acellular pertussis (Td, Tdap) vaccine. An adult who has not previously received Tdap or who does not know his vaccine status should receive 1 dose of Tdap. This initial dose should be followed by tetanus and diphtheria toxoids (Td) booster doses every 10 years. Adults with an unknown or incomplete history of completing a 3-dose immunization series with Td-containing vaccines should begin or complete a primary immunization series including a Tdap dose. Adults should receive a Td booster every 10 years.  Varicella vaccine. An adult without evidence of immunity to varicella should receive 2 doses or a second dose if he has previously received 1 dose.  Human papillomavirus (HPV) vaccine. Males aged 44 21 years who have not received the vaccine previously should receive the 3-dose series. Males aged 43 26 years may be immunized. Immunization is recommended through the age of 50 years for any male who has sex with males and did not get any or all doses earlier. Immunization is recommended for any person with an immunocompromised condition through the age of 23 years if he did not get any or all doses earlier. During the 3-dose series, the second dose should be obtained 4 8 weeks after the first dose. The third dose should be obtained 24 weeks after the first dose and 16 weeks after the second dose.  Zoster vaccine. One dose is recommended for adults aged 96 years or older unless certain conditions are present.  Measles, mumps, and rubella (MMR) vaccine. Adults born before 55 generally are considered immune to measles and mumps. Adults born in [birth year] or later should have 1 or more doses of MMR vaccine unless there is a contraindication to the vaccine or there is laboratory evidence of immunity to each of the three diseases. A routine second dose of MMR vaccine should be obtained at least 28 days after the first dose for students attending postsecondary schools, health care  workers, or international travelers. People who received inactivated measles vaccine or an unknown type of measles vaccine during 1963 1967 should receive 2 doses of MMR vaccine. People who received inactivated mumps vaccine or an unknown type of mumps vaccine before 1979 and are at high risk for mumps infection should consider immunization with 2 doses of MMR vaccine. Unvaccinated health care workers born before [birth year] who lack laboratory evidence of measles, mumps, or rubella immunity or laboratory confirmation of disease should consider measles and mumps immunization with 2 doses of MMR vaccine or rubella immunization with 1 dose of MMR vaccine.  Pneumococcal 13-valent conjugate (PCV13) vaccine. When indicated, a person who is uncertain of his immunization history and has no record of immunization  should receive the PCV13 vaccine. An adult aged 67 years or older who has certain medical conditions and has not been previously immunized should receive 1 dose of PCV13 vaccine. This PCV13 should be followed with a dose of pneumococcal polysaccharide (PPSV23) vaccine. The PPSV23 vaccine dose should be obtained at least 8 weeks after the dose of PCV13 vaccine. An adult aged 79 years or older who has certain medical conditions and previously received 1 or more doses of PPSV23 vaccine should receive 1 dose of PCV13. The PCV13 vaccine dose should be obtained 1 or more years after the last PPSV23 vaccine dose.  Pneumococcal polysaccharide (PPSV23) vaccine. When PCV13 is also indicated, PCV13 should be obtained first. All adults aged 74 years and older should be immunized. An adult younger than age 50 years who has certain medical conditions should be immunized. Any person who resides in a nursing home or long-term care facility should be immunized. An adult smoker should be immunized. People with an immunocompromised condition and certain other conditions should receive both PCV13 and PPSV23 vaccines. People with human  immunodeficiency virus (HIV) infection should be immunized as soon as possible after diagnosis. Immunization during chemotherapy or radiation therapy should be avoided. Routine use of PPSV23 vaccine is not recommended for American Indians, [place] Natives, or people younger than 65 years unless there are medical conditions that require PPSV23 vaccine. When indicated, people who have unknown immunization and have no record of immunization should receive PPSV23 vaccine. One-time revaccination 5 years after the first dose of PPSV23 is recommended for people aged 41 64 years who have chronic kidney failure, nephrotic syndrome, asplenia, or immunocompromised conditions. People who received 1 2 doses of PPSV23 before age 15 years should receive another dose of PPSV23 vaccine at age 48 years or later if at least 5 years have passed since the previous dose. Doses of PPSV23 are not needed for people immunized with PPSV23 at or after age 69 years.  Meningococcal vaccine. Adults with asplenia or persistent complement component deficiencies should receive 2 doses of quadrivalent meningococcal conjugate (MenACWY-D) vaccine. The doses should be obtained at least 2 months apart. Microbiologists working with certain meningococcal bacteria, [place] recruits, people at risk during an outbreak, and people who travel to or live in countries with a high rate of meningitis should be immunized. A first-year college student up through age 7 years who is living in a residence hall should receive a dose if he did not receive a dose on or after his 16th birthday. Adults who have certain high-risk conditions should receive one or more doses of vaccine.  Hepatitis A vaccine. Adults who wish to be protected from this disease, have certain high-risk conditions, work with hepatitis A-infected animals, work in hepatitis A research labs, or travel to or work in countries with a high rate of hepatitis A should be immunized. Adults who were  previously unvaccinated and who anticipate close contact with an international adoptee during the first 60 days after arrival in the [place] from a country with a high rate of hepatitis A should be immunized.  Hepatitis B vaccine. Adults who wish to be protected from this disease, have certain high-risk conditions, may be exposed to blood or other infectious body fluids, are household contacts or sex partners of hepatitis B positive people, are clients or workers in certain care facilities, or travel to or work in countries with a high rate of hepatitis B should be immunized.  Haemophilus influenzae type b (Hib) vaccine. A  previously unvaccinated person with asplenia or sickle cell disease or having a scheduled splenectomy should receive 1 dose of Hib vaccine. Regardless of previous immunization, a recipient of a hematopoietic stem cell transplant should receive a 3-dose series 6 12 months after his successful transplant. Hib vaccine is not recommended for adults with HIV infection. Preventive Service / Frequency Ages 62 to 3  Blood pressure check.** / Every 1 to 2 years.  Lipid and cholesterol check.** / Every 5 years beginning at age 43.  Hepatitis C blood test.** / For any individual with known risks for hepatitis C.  Skin self-exam. / Monthly.  Influenza vaccine. / Every year.  Tetanus, diphtheria, and acellular pertussis (Tdap, Td) vaccine.** / Consult your health care provider. 1 dose of Td every 10 years.  Varicella vaccine.** / Consult your health care provider.  HPV vaccine. / 3 doses over 6 months, if 48 or younger.  Measles, mumps, rubella (MMR) vaccine.** / You need at least 1 dose of MMR if you were born in 1957 or later. You may also need a second dose.  Pneumococcal 13-valent conjugate (PCV13) vaccine.** / Consult your health care provider.  Pneumococcal polysaccharide (PPSV23) vaccine.** / 1 to 2 doses if you smoke cigarettes or if you have certain  conditions.  Meningococcal vaccine.** / 1 dose if you are age 8 to 70 years and a Market researcher living in a residence hall, or have one of several medical conditions. You may also need additional booster doses.  Hepatitis A vaccine.** / Consult your health care provider.  Hepatitis B vaccine.** / Consult your health care provider.  Haemophilus influenzae type b (Hib) vaccine.** / Consult your health care provider. Ages 48 to 32  Blood pressure check.** / Every 1 to 2 years.  Lipid and cholesterol check.** / Every 5 years beginning at age 38.  Lung cancer screening. / Every year if you are aged 40 80 years and have a 30-pack-year history of smoking and currently smoke or have quit within the past 15 years. Yearly screening is stopped once you have quit smoking for at least 15 years or develop a health problem that would prevent you from having lung cancer treatment.  Fecal occult blood test (FOBT) of stool. / Every year beginning at age 4 and continuing until age 70. You may not have to do this test if you get a colonoscopy every 10 years.  Flexible sigmoidoscopy** or colonoscopy.** / Every 5 years for a flexible sigmoidoscopy or every 10 years for a colonoscopy beginning at age 76 and continuing until age 62.  Hepatitis C blood test.** / For all people born from [birth year] through 1965 and any individual with known risks for hepatitis C.  Skin self-exam. / Monthly.  Influenza vaccine. / Every year.  Tetanus, diphtheria, and acellular pertussis (Tdap/Td) vaccine.** / Consult your health care provider. 1 dose of Td every 10 years.  Varicella vaccine.** / Consult your health care provider.  Zoster vaccine.** / 1 dose for adults aged 60 years or older.  Measles, mumps, rubella (MMR) vaccine.** / You need at least 1 dose of MMR if you were born in 1957 or later. You may also need a second dose.  Pneumococcal 13-valent conjugate (PCV13) vaccine.** / Consult your health care  provider.  Pneumococcal polysaccharide (PPSV23) vaccine.** / 1 to 2 doses if you smoke cigarettes or if you have certain conditions.  Meningococcal vaccine.** / Consult your health care provider.  Hepatitis A vaccine.** / Consult your health care  provider.  Hepatitis B vaccine.** / Consult your health care provider.  Haemophilus influenzae type b (Hib) vaccine.** / Consult your health care provider. Ages 65 and over  Blood pressure check.** / Every 1 to 2 years.  Lipid and cholesterol check.**/ Every 5 years beginning at age 20.  Lung cancer screening. / Every year if you are aged 55 80 years and have a 30-pack-year history of smoking and currently smoke or have quit within the past 15 years. Yearly screening is stopped once you have quit smoking for at least 15 years or develop a health problem that would prevent you from having lung cancer treatment.  Fecal occult blood test (FOBT) of stool. / Every year beginning at age 50 and continuing until age 75. You may not have to do this test if you get a colonoscopy every 10 years.  Flexible sigmoidoscopy** or colonoscopy.** / Every 5 years for a flexible sigmoidoscopy or every 10 years for a colonoscopy beginning at age 50 and continuing until age 75.  Hepatitis C blood test.** / For all people born from 1946 through 1965 and any individual with known risks for hepatitis C.  Abdominal aortic aneurysm (AAA) screening.** / A one-time screening for ages 65 to 75 years who are current or former smokers.  Skin self-exam. / Monthly.  Influenza vaccine. / Every year.  Tetanus, diphtheria, and acellular pertussis (Tdap/Td) vaccine.** / 1 dose of Td every 10 years.  Varicella vaccine.** / Consult your health care provider.  Zoster vaccine.** / 1 dose for adults aged 60 years or older.  Pneumococcal 13-valent conjugate (PCV13) vaccine.** / Consult your health care provider.  Pneumococcal polysaccharide (PPSV23) vaccine.** / 1 dose for all  adults aged 65 years and older.  Meningococcal vaccine.** / Consult your health care provider.  Hepatitis A vaccine.** / Consult your health care provider.  Hepatitis B vaccine.** / Consult your health care provider.  Haemophilus influenzae type b (Hib) vaccine.** / Consult your health care provider. **Family history and personal history of risk and conditions may change your health care provider's recommendations. Document Released: 04/27/2001 Document Revised: 12/20/2012 Document Reviewed: 07/27/2010 [employer] Patient Information 2014 [employer].  

## 2013-07-04 ENCOUNTER — Telehealth: Payer: Self-pay | Admitting: Family Medicine

## 2013-07-04 NOTE — Telephone Encounter (Signed)
Relevant patient education assigned to patient using Emmi. ° °

## 2013-07-08 ENCOUNTER — Encounter: Payer: Self-pay | Admitting: Family Medicine

## 2013-07-08 DIAGNOSIS — E785 Hyperlipidemia, unspecified: Secondary | ICD-10-CM | POA: Insufficient documentation

## 2013-07-08 DIAGNOSIS — R040 Epistaxis: Secondary | ICD-10-CM | POA: Insufficient documentation

## 2013-07-08 DIAGNOSIS — N200 Calculus of kidney: Secondary | ICD-10-CM

## 2013-07-08 HISTORY — DX: Calculus of kidney: N20.0

## 2013-07-08 NOTE — Assessment & Plan Note (Signed)
Well controlled, no changes to meds. Encouraged heart healthy diet such as the DASH diet and exercise as tolerated.  °

## 2013-07-08 NOTE — Assessment & Plan Note (Signed)
Recent, only once but required trip to ED to stop the bleeding. He will report any further episodes so we can refer to ENT for further management

## 2013-07-08 NOTE — Assessment & Plan Note (Signed)
Has had 2 separate episdoes in past which have resolved with conservative management. Avoid offending foods, start probiotics. Do not eat large meals in late evening and consider raising head of bed.

## 2013-07-08 NOTE — Assessment & Plan Note (Signed)
Maintains adequate hydration

## 2013-07-08 NOTE — Assessment & Plan Note (Signed)
Tolerating statin, encouraged heart healthy diet, avoid trans fats, minimize simple carbs and saturated fats. Increase exercise as tolerated. Reviewed recent labs from previous PMD, panel wnl in 1/15 except elevated Triglycerides. Repeat lipid panel

## 2013-07-08 NOTE — Assessment & Plan Note (Signed)
HGB1C 9.4 in 1/15 with previous PMD. hgba1c acceptable, minimize simple carbs. Increase exercise as tolerated. Continue current meds for now and await repeat hgba1c

## 2013-07-09 ENCOUNTER — Ambulatory Visit: Payer: Medicare Other | Admitting: Family Medicine

## 2013-07-13 ENCOUNTER — Ambulatory Visit: Payer: Medicare Other | Admitting: Family Medicine

## 2013-07-13 ENCOUNTER — Telehealth: Payer: Self-pay

## 2013-07-13 DIAGNOSIS — E119 Type 2 diabetes mellitus without complications: Secondary | ICD-10-CM | POA: Diagnosis not present

## 2013-07-13 DIAGNOSIS — I10 Essential (primary) hypertension: Secondary | ICD-10-CM | POA: Diagnosis not present

## 2013-07-13 DIAGNOSIS — E785 Hyperlipidemia, unspecified: Secondary | ICD-10-CM | POA: Diagnosis not present

## 2013-07-13 LAB — RENAL FUNCTION PANEL
ALBUMIN: 4.2 g/dL (ref 3.5–5.2)
BUN: 14 mg/dL (ref 6–23)
CALCIUM: 8.9 mg/dL (ref 8.4–10.5)
CO2: 28 mEq/L (ref 19–32)
Chloride: 102 mEq/L (ref 96–112)
Creat: 0.71 mg/dL (ref 0.50–1.35)
Glucose, Bld: 125 mg/dL — ABNORMAL HIGH (ref 70–99)
Phosphorus: 3.3 mg/dL (ref 2.3–4.6)
Potassium: 4.7 mEq/L (ref 3.5–5.3)
Sodium: 138 mEq/L (ref 135–145)

## 2013-07-13 LAB — CBC
HEMATOCRIT: 45.2 % (ref 39.0–52.0)
Hemoglobin: 15.7 g/dL (ref 13.0–17.0)
MCH: 30.7 pg (ref 26.0–34.0)
MCHC: 34.7 g/dL (ref 30.0–36.0)
MCV: 88.3 fL (ref 78.0–100.0)
Platelets: 179 10*3/uL (ref 150–400)
RBC: 5.12 MIL/uL (ref 4.22–5.81)
RDW: 13.9 % (ref 11.5–15.5)
WBC: 5.5 10*3/uL (ref 4.0–10.5)

## 2013-07-13 LAB — LIPID PANEL
CHOLESTEROL: 172 mg/dL (ref 0–200)
HDL: 33 mg/dL — ABNORMAL LOW (ref >39)
LDL Cholesterol: 96 mg/dL (ref 0–99)
TRIGLYCERIDES: 216 mg/dL — AB (ref <150)
Total CHOL/HDL Ratio: 5.2 Ratio
VLDL: 43 mg/dL — AB (ref 0–40)

## 2013-07-13 LAB — HEPATIC FUNCTION PANEL
ALK PHOS: 82 U/L (ref 39–117)
ALT: 31 U/L (ref 0–53)
AST: 21 U/L (ref 0–37)
Albumin: 4.2 g/dL (ref 3.5–5.2)
Bilirubin, Direct: 0.1 mg/dL (ref 0.0–0.3)
Indirect Bilirubin: 0.6 mg/dL (ref 0.2–1.2)
TOTAL PROTEIN: 6.8 g/dL (ref 6.0–8.3)
Total Bilirubin: 0.7 mg/dL (ref 0.2–1.2)

## 2013-07-13 LAB — HEMOGLOBIN A1C
Hgb A1c MFr Bld: 10 % — ABNORMAL HIGH (ref <5.7)
Mean Plasma Glucose: 240 mg/dL — ABNORMAL HIGH (ref <117)

## 2013-07-13 LAB — TSH: TSH: 2.111 u[IU]/mL (ref 0.350–4.500)

## 2013-07-13 NOTE — Telephone Encounter (Signed)
Relevant patient education assigned to patient using Emmi. ° °

## 2013-07-16 ENCOUNTER — Other Ambulatory Visit: Payer: Self-pay | Admitting: Family Medicine

## 2013-07-16 DIAGNOSIS — E1165 Type 2 diabetes mellitus with hyperglycemia: Secondary | ICD-10-CM

## 2013-07-16 DIAGNOSIS — IMO0002 Reserved for concepts with insufficient information to code with codable children: Secondary | ICD-10-CM

## 2013-07-26 ENCOUNTER — Encounter: Payer: Self-pay | Admitting: Family Medicine

## 2013-08-07 ENCOUNTER — Encounter: Payer: Self-pay | Admitting: Endocrinology

## 2013-08-07 ENCOUNTER — Ambulatory Visit (INDEPENDENT_AMBULATORY_CARE_PROVIDER_SITE_OTHER): Payer: Medicare Other | Admitting: Endocrinology

## 2013-08-07 VITALS — BP 126/70 | HR 85 | Temp 97.7°F | Ht 69.5 in | Wt 240.0 lb

## 2013-08-07 DIAGNOSIS — E1049 Type 1 diabetes mellitus with other diabetic neurological complication: Secondary | ICD-10-CM

## 2013-08-07 MED ORDER — INSULIN REGULAR HUMAN 100 UNIT/ML IJ SOLN: 35.0000 [IU] | Freq: Three times a day (TID) | INTRAMUSCULAR | Status: DC

## 2013-08-07 NOTE — Progress Notes (Signed)
Subjective:    Patient ID: Michael Cunningham, male    DOB: September 19, 1945, 68 y.o.   MRN: [id number]  HPI pt states DM was dx'ed in 1999, on a routine blood test; he has mild neuropathy of the lower extremities; he is unaware of any associated chronic complications.  he has been on insulin since 2012.  pt says his diet and exercise are not very good (activity is limited by knee pain).  He has never had pancreatitis, severe hypoglycemia or DKA. He says he cannot afford insulin analogs.  Past Medical History  Diagnosis Date  . Hypertension   . Multiple lipomas     hx. mulitple and some remains -arms, legs  . Diverticulosis of colon     LEFT SIDE  . History of small bowel obstruction     X2  28 YRS AGO AND LAST ONE 06-02-2001--  RESOLVED WITHOUT SURGICAL INTERVENTION  . Type 2 diabetes mellitus   . History of kidney stones   . Left ureteral calculus   . Complication of anesthesia     11'12 Surgery with excrutiating "head about to explode" upon awakening  -lasted several hours postop  ( SURGERY DONE 08-13-2011 AT [facility] PT DID OK POST OP)  . At risk for sleep apnea     STOP-BANG =  4   SENT TO PCP 04-26-2013  . Chicken pox as a child  . Measles as a child  . Epistaxis   . Hyperlipidemia   . Recurrent kidney stones 07/08/2013    Follows with Dr Gaynelle Arabian He believes they are Calcium oxalate stones    Past Surgical History  Procedure Laterality Date  . Lipoma excision  1975    multiple, 10-12  . Knee arthroscopy  08/13/2011    right ARTHROSCOPY KNEE;  Surgeon: Gearlean Alf, MD;  Location: [facility];  Service: Orthopedics;  Laterality: Right;  medial  meniscal debridement, excision of plica, synovectomy  . Knee arthroscopy w/ meniscectomy Right 01-29-2011    twice, first trimmed mediscus and cyst  . Colonoscopy w/ polypectomy  LAST ONE 2013    History   Social History  . Marital Status: Married    Spouse Name: N/A    Number of Children: N/A  . Years of Education: N/A   Occupational  History  . Not on file.   Social History Main Topics  . Smoking status: Former Smoker -- 4 years    Types: Cigarettes    Quit date: 06/14/1967  . Smokeless tobacco: Never Used  . Alcohol Use: No  . Drug Use: No  . Sexual Activity: Yes     Comment: lives with wife   Other Topics Concern  . Not on file   Social History Narrative  . No narrative on file    Current Outpatient Prescriptions on File Prior to Visit  Medication Sig Dispense Refill  . acetaminophen (TYLENOL) 500 MG tablet Take 1,000 mg by mouth every 6 (six) hours as needed. For pain      . aspirin EC 81 MG tablet Take 81 mg by mouth daily with breakfast.       . glipiZIDE (GLUCOTROL XL) 10 MG 24 hr tablet Take 10 mg by mouth 2 (two) times daily.      . insulin detemir (LEVEMIR) 100 UNIT/ML injection Inject 100 Units into the skin at bedtime.      [name] losartan (COZAAR) 50 MG tablet Take 50 mg by mouth daily with breakfast.       .  Multiple Vitamins-Minerals (CENTRUM SILVER ULTRA MENS PO) Take 1 tablet by mouth daily.      . simvastatin (ZOCOR) 20 MG tablet Take 20 mg by mouth every evening.      . sitaGLIPtin (JANUVIA) 100 MG tablet Take 100 mg by mouth daily.       No current facility-administered medications on file prior to visit.    Allergies  Allergen Reactions  . Ciprofloxacin Other (See Comments)    Abdominal pain  . Lipitor [Atorvastatin Calcium] Other (See Comments)    Severe muscle pain  . Lisinopril Cough  . Metformin And Related Diarrhea    Family History  Problem Relation Age of Onset  . Hypertension Mother   . Cancer Mother 49    ovarian  . Heart disease Father     chf  . Heart attack Father 15  . Cancer Sister     breast- just finished her last radiation  . Heart disease Maternal Grandfather     ?  [name] Heart attack Paternal Grandfather     BP 126/70  Pulse 85  Temp(Src) 97.7 F (36.5 C) (Oral)  Ht 5' 9.5" (1.765 m)  Wt 240 lb (108.863 kg)  BMI 34.95 kg/m2  SpO2 96%  Review of  Systems denies weight loss, blurry vision, headache, chest pain, sob, n/v, urinary frequency, muscle cramps, excessive diaphoresis, memory loss, depression, cold intolerance, rhinorrhea, and easy bruising.      Objective:   Physical Exam VS: see vs page GEN: no distress HEAD: head: no deformity eyes: no periorbital swelling, no proptosis external nose and ears are normal mouth: no lesion seen NECK: supple, thyroid is not enlarged CHEST WALL: no deformity LUNGS: clear to auscultation BREASTS:  No gynecomastia CV: reg rate and rhythm, no murmur ABD: abdomen is soft, nontender.  no hepatosplenomegaly.  not distended.  Self-reducing ventral hernia MUSCULOSKELETAL: muscle bulk and strength are grossly normal.  no obvious joint swelling.  gait is normal and steady EXTEMITIES: no deformity.  no ulcer on the feet.  feet are of normal color and temp.  1+ bilat leg edema.  There is bilateral onychomycosis PULSES: dorsalis pedis intact bilat.  no carotid bruit NEURO:  cn 2-12 grossly intact.   readily moves all 4's.  sensation is intact to touch on the feet SKIN:  Normal texture and temperature.  No rash or suspicious lesion is visible.   NODES:  None palpable at the neck PSYCH: alert, well-oriented.  Does not appear anxious nor depressed.   Lab Results  Component Value Date   HGBA1C 10.0* 07/13/2013  i have reviewed the records in epic from other provider(s).   i reviewed 2013 electrocardiogram    Assessment & Plan:  DM: new to me: he needs increased rx. Knee pain: unchanged: this limits exercise rx: This impairs the ability to achieve glycemic control.  I'll work around this as best I can. Obesity: This impairs the ability to achieve glycemic control.  I'll work around this as best I can.       Patient Instructions  good diet and exercise habits significanly improve the control of your diabetes.  please let me know if you wish to be referred to a dietician.  high blood sugar is very  risky to your health.  you should see an eye doctor and dentist every year.  You are at higher than average risk for pneumonia and hepatitis-B.  You should be vaccinated against both.   controlling your blood pressure and cholesterol drastically  reduces the damage diabetes does to your body.  this also applies to quitting smoking.  please discuss these with your doctor.   check your blood sugar twice a day.  vary the time of day when you check, between before the 3 meals, and at bedtime.  also check if you have symptoms of your blood sugar being too high or too low.  please keep a record of the readings and bring it to your next appointment here.  You can write it on any piece of paper.  please call us sooner if your blood sugar goes below 70, or if you have a lot of readings over 200.   For now, please change glipizide, januvia, and levemir to regular insulin, 35 units 3 times a day (just before each meal).   Please come back for a follow-up appointment in 2 weeks.

## 2013-08-07 NOTE — Patient Instructions (Addendum)
good diet and exercise habits significanly improve the control of your diabetes.  please let me know if you wish to be referred to a dietician.  high blood sugar is very risky to your health.  you should see an eye doctor and dentist every year.  You are at higher than average risk for pneumonia and hepatitis-B.  You should be vaccinated against both.   controlling your blood pressure and cholesterol drastically reduces the damage diabetes does to your body.  this also applies to quitting smoking.  please discuss these with your doctor.   check your blood sugar twice a day.  vary the time of day when you check, between before the 3 meals, and at bedtime.  also check if you have symptoms of your blood sugar being too high or too low.  please keep a record of the readings and bring it to your next appointment here.  You can write it on any piece of paper.  please call us sooner if your blood sugar goes below 70, or if you have a lot of readings over 200.   For now, please change glipizide, januvia, and levemir to regular insulin, 35 units 3 times a day (just before each meal).   Please come back for a follow-up appointment in 2 weeks.

## 2013-08-08 ENCOUNTER — Telehealth: Payer: Self-pay

## 2013-08-08 MED ORDER — INSULIN REGULAR HUMAN 100 UNIT/ML IJ SOLN: 35.0000 [IU] | Freq: Three times a day (TID) | INTRAMUSCULAR | Status: DC

## 2013-08-08 MED ORDER — "INSULIN SYRINGE-NEEDLE U-100 31G X 5/16"" 0.5 ML MISC": Status: DC

## 2013-08-08 NOTE — Telephone Encounter (Signed)
Ok, i changed rx and resent

## 2013-08-08 NOTE — Telephone Encounter (Addendum)
Pt came to office today. Pt states that the Dx used during ov yesterday is wrong. Dx code states type 1 diabetes and it should be type 2. Also, pt states that insurance is not going to cover insulin rx for 4 vials they will only supply 3 vials which equals a 28 day supply. The pharmacy told the pt if the rx read "or as needed" in addition to the directions the medication would be covered. Pharmacy is requesting to change to Novolin R due to cheaper cost for the pt.  Please advise, Thanks!

## 2013-08-08 NOTE — Telephone Encounter (Signed)
Pt advised.

## 2013-08-15 ENCOUNTER — Encounter: Payer: Self-pay | Admitting: Endocrinology

## 2013-08-15 ENCOUNTER — Telehealth: Payer: Self-pay | Admitting: *Deleted

## 2013-08-15 MED ORDER — SIMVASTATIN 20 MG PO TABS
20.0000 mg | ORAL_TABLET | Freq: Every evening | ORAL | Status: DC
Start: 2013-08-15 — End: 2013-09-18

## 2013-08-15 NOTE — Telephone Encounter (Signed)
Pt left message requesting refill of simvastatin be sent to [facility] on [address] in [place]. Refill sent to pharmacy.

## 2013-08-21 ENCOUNTER — Encounter: Payer: Self-pay | Admitting: Endocrinology

## 2013-08-21 ENCOUNTER — Ambulatory Visit (INDEPENDENT_AMBULATORY_CARE_PROVIDER_SITE_OTHER): Payer: Medicare Other | Admitting: Endocrinology

## 2013-08-21 VITALS — BP 130/86 | HR 84 | Temp 98.7°F | Wt 243.0 lb

## 2013-08-21 DIAGNOSIS — E1049 Type 1 diabetes mellitus with other diabetic neurological complication: Secondary | ICD-10-CM

## 2013-08-21 MED ORDER — GLUCOSE BLOOD VI STRP: 1.0000 | ORAL_STRIP | Freq: Two times a day (BID) | Status: DC

## 2013-08-21 NOTE — Progress Notes (Signed)
Subjective:    Patient ID: Michael Cunningham, male    DOB: 1945-04-01, 68 y.o.   MRN: [id number]  HPI pt returns for f/u of insulin-requiring DM (dx'ed 1999, on a routine blood test; he has mild neuropathy of the lower extremities; he is unaware of any associated chronic complications.  he has been on insulin since 2012.  pt says his diet and exercise are not very good (activity is limited by knee pain); he has never had pancreatitis, severe hypoglycemia or DKA; he says he cannot afford insulin analogs). he brings a record of his cbg's which i have reviewed today.  It varies from 87-200's.  It is lowest in the afternoon, and highest in am.  He does not check at hs.  He has lightheadedness with cbg of 87.   Past Medical History  Diagnosis Date  . Hypertension   . Multiple lipomas     hx. mulitple and some remains -arms, legs  . Diverticulosis of colon     LEFT SIDE  . History of small bowel obstruction     X2  28 YRS AGO AND LAST ONE 06-02-2001--  RESOLVED WITHOUT SURGICAL INTERVENTION  . Type 2 diabetes mellitus   . History of kidney stones   . Left ureteral calculus   . Complication of anesthesia     11'12 Surgery with excrutiating "head about to explode" upon awakening  -lasted several hours postop  ( SURGERY DONE 08-13-2011 AT [facility] PT DID OK POST OP)  . At risk for sleep apnea     STOP-BANG =  4   SENT TO PCP 04-26-2013  . Chicken pox as a child  . Measles as a child  . Epistaxis   . Hyperlipidemia   . Recurrent kidney stones 07/08/2013    Follows with Dr Gaynelle Arabian He believes they are Calcium oxalate stones    Past Surgical History  Procedure Laterality Date  . Lipoma excision  1975    multiple, 10-12  . Knee arthroscopy  08/13/2011    right ARTHROSCOPY KNEE;  Surgeon: Gearlean Alf, MD;  Location: [facility];  Service: Orthopedics;  Laterality: Right;  medial  meniscal debridement, excision of plica, synovectomy  . Knee arthroscopy w/ meniscectomy Right 01-29-2011    twice, first  trimmed mediscus and cyst  . Colonoscopy w/ polypectomy  LAST ONE 2013    History   Social History  . Marital Status: Married    Spouse Name: N/A    Number of Children: N/A  . Years of Education: N/A   Occupational History  . Not on file.   Social History Main Topics  . Smoking status: Former Smoker -- 4 years    Types: Cigarettes    Quit date: 06/14/1967  . Smokeless tobacco: Never Used  . Alcohol Use: No  . Drug Use: No  . Sexual Activity: Yes     Comment: lives with wife   Other Topics Concern  . Not on file   Social History Narrative  . No narrative on file    Current Outpatient Prescriptions on File Prior to Visit  Medication Sig Dispense Refill  . acetaminophen (TYLENOL) 500 MG tablet Take 1,000 mg by mouth every 6 (six) hours as needed. For pain      . aspirin EC 81 MG tablet Take 81 mg by mouth daily with breakfast.       . Insulin Syringe-Needle U-100 (BD INSULIN SYRINGE ULTRAFINE) 31G X 5/16" 0.5 ML MISC Use to inject insulin  3 times per day. Dx code 250.61  100 each  3  . losartan (COZAAR) 50 MG tablet Take 50 mg by mouth daily with breakfast.       . Multiple Vitamins-Minerals (CENTRUM SILVER ULTRA MENS PO) Take 1 tablet by mouth daily.      . simvastatin (ZOCOR) 20 MG tablet Take 1 tablet (20 mg total) by mouth every evening.  30 tablet  3   No current facility-administered medications on file prior to visit.    Allergies  Allergen Reactions  . Ciprofloxacin Other (See Comments)    Abdominal pain  . Lipitor [Atorvastatin Calcium] Other (See Comments)    Severe muscle pain  . Lisinopril Cough  . Metformin And Related Diarrhea    Family History  Problem Relation Age of Onset  . Hypertension Mother   . Cancer Mother 6    ovarian  . Heart disease Father     chf  . Heart attack Father 43  . Cancer Sister     breast- just finished her last radiation  . Heart disease Maternal Grandfather     ?  [name] Heart attack Paternal Grandfather   . Diabetes  Neg Hx     BP 130/86  Pulse 84  Temp(Src) 98.7 F (37.1 C) (Oral)  Wt 243 lb (110.224 kg)  SpO2 95%  Review of Systems He denies hypoglycemia and LOC.      Objective:   Physical Exam VITAL SIGNS:  See vs page GENERAL: no distress SKIN:  Insulin injection sites at the anterior abdomen are normal  Lab Results  Component Value Date   HGBA1C 10.0* 07/13/2013       Assessment & Plan:  DM: moderate exacerbation.  The pattern of his cbg's indicates he needs some adjustment in his therapy. Knee pain: unchanged: this limits exercise rx: This impairs the ability to achieve glycemic control.  I'll work around this as best I can.   Obesity: This impairs the ability to achieve glycemic control.  I'll also work around this as best I can.    Patient is advised the following: Patient Instructions  check your blood sugar twice a day.  vary the time of day when you check, between before the 3 meals, and at bedtime.  also check if you have symptoms of your blood sugar being too high or too low.  please keep a record of the readings and bring it to your next appointment here.  You can write it on any piece of paper.  please call us sooner if your blood sugar goes below 70, or if you have a lot of readings over 200.   Please reduce insulin to 3 times a day (just before each meal) 40-30-45 units.   The next step is to find out why it is high in the morning (does it go up after supper or overnight?).  You can find this out by checking at bedtime. Feel free to message [place], and tell us what you find.  Then we can either increase the supper insulin, or add a longer-acting insulin at bedtime.   Please come back for a follow-up appointment in 1 month.

## 2013-08-21 NOTE — Patient Instructions (Addendum)
check your blood sugar twice a day.  vary the time of day when you check, between before the 3 meals, and at bedtime.  also check if you have symptoms of your blood sugar being too high or too low.  please keep a record of the readings and bring it to your next appointment here.  You can write it on any piece of paper.  please call us sooner if your blood sugar goes below 70, or if you have a lot of readings over 200.   Please reduce insulin to 3 times a day (just before each meal) 40-30-45 units.   The next step is to find out why it is high in the morning (does it go up after supper or overnight?).  You can find this out by checking at bedtime. Feel free to message [place], and tell us what you find.  Then we can either increase the supper insulin, or add a longer-acting insulin at bedtime.   Please come back for a follow-up appointment in 1 month.

## 2013-08-22 ENCOUNTER — Other Ambulatory Visit: Payer: Self-pay

## 2013-08-22 MED ORDER — ACCU-CHEK SOFTCLIX LANCET DEV MISC: Status: DC

## 2013-08-31 ENCOUNTER — Other Ambulatory Visit: Payer: Self-pay | Admitting: Endocrinology

## 2013-08-31 ENCOUNTER — Encounter: Payer: Self-pay | Admitting: Endocrinology

## 2013-08-31 MED ORDER — INSULIN NPH (HUMAN) (ISOPHANE) 100 UNIT/ML ~~LOC~~ SUSP: 10.0000 [IU] | Freq: Every day | SUBCUTANEOUS | Status: DC

## 2013-09-06 ENCOUNTER — Other Ambulatory Visit: Payer: Self-pay

## 2013-09-06 MED ORDER — "INSULIN SYRINGE-NEEDLE U-100 31G X 5/16"" 0.5 ML MISC": Status: DC

## 2013-09-18 ENCOUNTER — Encounter: Payer: Self-pay | Admitting: Endocrinology

## 2013-09-18 ENCOUNTER — Ambulatory Visit (INDEPENDENT_AMBULATORY_CARE_PROVIDER_SITE_OTHER): Payer: Medicare Other | Admitting: Family Medicine

## 2013-09-18 ENCOUNTER — Ambulatory Visit (INDEPENDENT_AMBULATORY_CARE_PROVIDER_SITE_OTHER): Payer: Medicare Other | Admitting: Endocrinology

## 2013-09-18 ENCOUNTER — Encounter: Payer: Self-pay | Admitting: Family Medicine

## 2013-09-18 VITALS — BP 132/78 | HR 88 | Temp 97.7°F | Ht 69.5 in | Wt 243.0 lb

## 2013-09-18 VITALS — BP 118/70 | HR 85 | Temp 97.6°F | Ht 69.5 in | Wt 242.1 lb

## 2013-09-18 DIAGNOSIS — E669 Obesity, unspecified: Secondary | ICD-10-CM

## 2013-09-18 DIAGNOSIS — E785 Hyperlipidemia, unspecified: Secondary | ICD-10-CM | POA: Diagnosis not present

## 2013-09-18 DIAGNOSIS — IMO0001 Reserved for inherently not codable concepts without codable children: Secondary | ICD-10-CM

## 2013-09-18 DIAGNOSIS — I10 Essential (primary) hypertension: Secondary | ICD-10-CM

## 2013-09-18 DIAGNOSIS — E1165 Type 2 diabetes mellitus with hyperglycemia: Principal | ICD-10-CM

## 2013-09-18 HISTORY — DX: Obesity, unspecified: E66.9

## 2013-09-18 MED ORDER — SIMVASTATIN 20 MG PO TABS: 20.0000 mg | ORAL_TABLET | Freq: Every evening | ORAL | Status: DC

## 2013-09-18 MED ORDER — LOSARTAN POTASSIUM 50 MG PO TABS: 50.0000 mg | ORAL_TABLET | Freq: Every day | ORAL | Status: DC

## 2013-09-18 NOTE — Assessment & Plan Note (Signed)
Encouraged DASH diet, decrease po intake and increase exercise as tolerated. Needs 7-8 hours of sleep nightly. Avoid trans fats, eat small, frequent meals every 4-5 hours with lean proteins, complex carbs and healthy fats. Minimize simple carbs, GMO foods. 

## 2013-09-18 NOTE — Progress Notes (Signed)
Patient ID: Michael Cunningham, male   DOB: 1945-05-16, 68 y.o.   MRN: [id number] Michael Cunningham [id number] January 26, 1946 09/18/2013      Progress Note New Patient  Subjective  Chief Complaint  Chief Complaint  Patient presents with  . Follow-up    2 month    HPI  Patient is a 68 year old male in today for routine medical care. Doing fairly well. No recent illness. Has now been checking his sugars routinely with new meter. Lowest sugar around 145 recently , did have an 87 but he has been better since he readjusted his insulin recently. All numbers now around 200s.. Is not struggling with recent illness. Denies CP/palp/SOB/HA/congestion/fevers/GI or GU c/o. Taking meds as prescribed  Past Medical History  Diagnosis Date  . Hypertension   . Multiple lipomas     hx. mulitple and some remains -arms, legs  . Diverticulosis of colon     LEFT SIDE  . History of small bowel obstruction     X2  28 YRS AGO AND LAST ONE 06-02-2001--  RESOLVED WITHOUT SURGICAL INTERVENTION  . Type 2 diabetes mellitus   . History of kidney stones   . Left ureteral calculus   . Complication of anesthesia     11'12 Surgery with excrutiating "head about to explode" upon awakening  -lasted several hours postop  ( SURGERY DONE 08-13-2011 AT [facility] PT DID OK POST OP)  . At risk for sleep apnea     STOP-BANG =  4   SENT TO PCP 04-26-2013  . Chicken pox as a child  . Measles as a child  . Epistaxis   . Hyperlipidemia   . Recurrent kidney stones 07/08/2013    Follows with Dr Gaynelle Arabian He believes they are Calcium oxalate stones  . Obesity, unspecified 09/18/2013    Past Surgical History  Procedure Laterality Date  . Lipoma excision  1975    multiple, 10-12  . Knee arthroscopy  08/13/2011    right ARTHROSCOPY KNEE;  Surgeon: Gearlean Alf, MD;  Location: [facility];  Service: Orthopedics;  Laterality: Right;  medial  meniscal debridement, excision of plica, synovectomy  . Knee arthroscopy w/ meniscectomy Right 01-29-2011   twice, first trimmed mediscus and cyst  . Colonoscopy w/ polypectomy  LAST ONE 2013    Family History  Problem Relation Age of Onset  . Hypertension Mother   . Cancer Mother 43    ovarian  . Heart disease Father     chf  . Heart attack Father 13  . Cancer Sister     breast- just finished her last radiation  . Heart disease Maternal Grandfather     ?  [name] Heart attack Paternal Grandfather   . Diabetes Neg Hx     History   Social History  . Marital Status: Married    Spouse Name: N/A    Number of Children: N/A  . Years of Education: N/A   Occupational History  . Not on file.   Social History Main Topics  . Smoking status: Former Smoker -- 4 years    Types: Cigarettes    Quit date: 06/14/1967  . Smokeless tobacco: Never Used  . Alcohol Use: No  . Drug Use: No  . Sexual Activity: Yes     Comment: lives with wife   Other Topics Concern  . Not on file   Social History Narrative  . No narrative on file    Current Outpatient Prescriptions on File Prior to  Visit  Medication Sig Dispense Refill  . acetaminophen (TYLENOL) 500 MG tablet Take 1,000 mg by mouth every 6 (six) hours as needed. For pain      . aspirin EC 81 MG tablet Take 81 mg by mouth daily with breakfast.       . glucose blood (ACCU-CHEK AVIVA) test strip 1 each by Other route 2 (two) times daily. And lancets 2/day 250.01  100 each  12  . insulin NPH Human (HUMULIN N) 100 UNIT/ML injection Inject 0.1 mLs (10 Units total) into the skin at bedtime.  10 mL  11  . insulin regular (NOVOLIN R,HUMULIN R) 100 units/mL injection 3 times a day (just before each meal), 40-30-45 units.      . Insulin Syringe-Needle U-100 (BD INSULIN SYRINGE ULTRAFINE) 31G X 5/16" 0.5 ML MISC Use to inject insulin 4 times per day. Dx code 250.61  150 each  3  . Lancet Devices (ACCU-CHEK SOFTCLIX) lancets Use 2 times daily. Dx Code 250.01  100 each  6  . losartan (COZAAR) 50 MG tablet Take 50 mg by mouth daily with breakfast.       .  Multiple Vitamins-Minerals (CENTRUM SILVER ULTRA MENS PO) Take 1 tablet by mouth daily.      . simvastatin (ZOCOR) 20 MG tablet Take 1 tablet (20 mg total) by mouth every evening.  30 tablet  3   No current facility-administered medications on file prior to visit.    Allergies  Allergen Reactions  . Ciprofloxacin Other (See Comments)    Abdominal pain  . Lipitor [Atorvastatin Calcium] Other (See Comments)    Severe muscle pain  . Lisinopril Cough  . Metformin And Related Diarrhea    Review of Systems  Review of Systems  Constitutional: Negative for fever and malaise/fatigue.  HENT: Negative for congestion.   Eyes: Negative for discharge.  Respiratory: Negative for shortness of breath.   Cardiovascular: Negative for chest pain, palpitations and leg swelling.  Gastrointestinal: Negative for nausea, abdominal pain and diarrhea.  Genitourinary: Negative for dysuria.  Musculoskeletal: Negative for falls.  Skin: Negative for rash.  Neurological: Negative for loss of consciousness and headaches.  Endo/Heme/Allergies: Negative for polydipsia.  Psychiatric/Behavioral: Negative for depression and suicidal ideas. The patient is not nervous/anxious and does not have insomnia.     Objective  BP 118/70  Pulse 85  Temp(Src) 97.6 F (36.4 C) (Oral)  Ht 5' 9.5" (1.765 m)  Wt 242 lb 1.3 oz (109.807 kg)  BMI 35.25 kg/m2  SpO2 95%  Physical Exam  Physical Exam  Constitutional: He is oriented to person, place, and time and well-developed, well-nourished, and in no distress. No distress.  HENT:  Head: Normocephalic and atraumatic.  Eyes: Conjunctivae are normal.  Neck: Neck supple. No thyromegaly present.  Cardiovascular: Normal rate, regular rhythm and normal heart sounds.   No murmur heard. Pulmonary/Chest: Effort normal and breath sounds normal. No respiratory distress.  Abdominal: He exhibits no distension and no mass. There is no tenderness.  Musculoskeletal: He exhibits no  edema.  Neurological: He is alert and oriented to person, place, and time.  Skin: Skin is warm.  Psychiatric: Memory, affect and judgment normal.       Assessment & Plan  Hypertension Well controlled, no changes to meds. Encouraged heart healthy diet such as the DASH diet and exercise as tolerated.   Type II or unspecified type diabetes mellitus without mention of complication, uncontrolled Sugars still running over 200 at all times of the day.  Sees endocrinology later today. Novolin R 40-30-45 tid with meals, and Novolin N 10 units qhs at present.  Other and unspecified hyperlipidemia Tolerating statin, encouraged heart healthy diet, avoid trans fats, minimize simple carbs and saturated fats. Increase exercise as tolerated  Obesity, unspecified Encouraged DASH diet, decrease po intake and increase exercise as tolerated. Needs 7-8 hours of sleep nightly. Avoid trans fats, eat small, frequent meals every 4-5 hours with lean proteins, complex carbs and healthy fats. Minimize simple carbs, GMO foods.

## 2013-09-18 NOTE — Patient Instructions (Addendum)
check your blood sugar twice a day.  vary the time of day when you check, between before the 3 meals, and at bedtime.  also check if you have symptoms of your blood sugar being too high or too low.  please keep a record of the readings and bring it to your next appointment here.  You can write it on any piece of paper.  please call us sooner if your blood sugar goes below 70, or if you have a lot of readings over 200.     Please come back for a follow-up appointment in 6 weeks.    Please increase the bedtime NPH insulin to 20 units, and message [place] next week to report progress.

## 2013-09-18 NOTE — Progress Notes (Signed)
Pre visit review using our clinic review tool, if applicable. No additional management support is needed unless otherwise documented below in the visit note. 

## 2013-09-18 NOTE — Assessment & Plan Note (Signed)
Well controlled, no changes to meds. Encouraged heart healthy diet such as the DASH diet and exercise as tolerated.  °

## 2013-09-18 NOTE — Patient Instructions (Signed)
DASH Eating Plan  DASH stands for "Dietary Approaches to Stop Hypertension." The DASH eating plan is a healthy eating plan that has been shown to reduce high blood pressure (hypertension). Additional health benefits may include reducing the risk of type 2 diabetes mellitus, heart disease, and stroke. The DASH eating plan may also help with weight loss.  WHAT DO I NEED TO KNOW ABOUT THE DASH EATING PLAN?  For the DASH eating plan, you will follow these general guidelines:  · Choose foods with a percent daily value for sodium of less than 5% (as listed on the food label).  · Use salt-free seasonings or herbs instead of table salt or sea salt.  · Check with your health care provider or pharmacist before using salt substitutes.  · Eat lower-sodium products, often labeled as "lower sodium" or "no salt added."  · Eat fresh foods.  · Eat more vegetables, fruits, and low-fat dairy products.  · Choose whole grains. Look for the word "whole" as the first word in the ingredient list.  · Choose fish and skinless chicken or turkey more often than red meat. Limit fish, poultry, and meat to 6 oz (170 g) each day.  · Limit sweets, desserts, sugars, and sugary drinks.  · Choose heart-healthy fats.  · Limit cheese to 1 oz (28 g) per day.  · Eat more home-cooked food and less restaurant, buffet, and fast food.  · Limit fried foods.  · Cook foods using methods other than frying.  · Limit canned vegetables. If you do use them, rinse them well to decrease the sodium.  · When eating at a restaurant, ask that your food be prepared with less salt, or no salt if possible.  WHAT FOODS CAN I EAT?  Seek help from a dietitian for individual calorie needs.  Grains  Whole grain or whole wheat bread. Brown rice. Whole grain or whole wheat pasta. Quinoa, bulgur, and whole grain cereals. Low-sodium cereals. Corn or whole wheat flour tortillas. Whole grain cornbread. Whole grain crackers. Low-sodium crackers.  Vegetables  Fresh or frozen vegetables  (raw, steamed, roasted, or grilled). Low-sodium or reduced-sodium tomato and vegetable juices. Low-sodium or reduced-sodium tomato sauce and paste. Low-sodium or reduced-sodium canned vegetables.   Fruits  All fresh, canned (in natural juice), or frozen fruits.  Meat and Other Protein Products  Ground beef (85% or leaner), grass-fed beef, or beef trimmed of fat. Skinless chicken or turkey. Ground chicken or turkey. Pork trimmed of fat. All fish and seafood. Eggs. Dried beans, peas, or lentils. Unsalted nuts and seeds. Unsalted canned beans.  Dairy  Low-fat dairy products, such as skim or 1% milk, 2% or reduced-fat cheeses, low-fat ricotta or cottage cheese, or plain low-fat yogurt. Low-sodium or reduced-sodium cheeses.  Fats and Oils  Tub margarines without trans fats. Light or reduced-fat mayonnaise and salad dressings (reduced sodium). Avocado. Safflower, olive, or canola oils. Natural peanut or almond butter.  Other  Unsalted popcorn and pretzels.  The items listed above may not be a complete list of recommended foods or beverages. Contact your dietitian for more options.  WHAT FOODS ARE NOT RECOMMENDED?  Grains  White bread. White pasta. White rice. Refined cornbread. Bagels and croissants. Crackers that contain trans fat.  Vegetables  Creamed or fried vegetables. Vegetables in a cheese sauce. Regular canned vegetables. Regular canned tomato sauce and paste. Regular tomato and vegetable juices.  Fruits  Dried fruits. Canned fruit in light or heavy syrup. Fruit juice.  Meat and Other Protein   Products  Fatty cuts of meat. Ribs, chicken wings, bacon, sausage, bologna, salami, chitterlings, fatback, hot dogs, bratwurst, and packaged luncheon meats. Salted nuts and seeds. Canned beans with salt.  Dairy  Whole or 2% milk, cream, half-and-half, and cream cheese. Whole-fat or sweetened yogurt. Full-fat cheeses or blue cheese. Nondairy creamers and whipped toppings. Processed cheese, cheese spreads, or cheese  curds.  Condiments  Onion and garlic salt, seasoned salt, table salt, and sea salt. Canned and packaged gravies. Worcestershire sauce. Tartar sauce. Barbecue sauce. Teriyaki sauce. Soy sauce, including reduced sodium. Steak sauce. Fish sauce. Oyster sauce. Cocktail sauce. Horseradish. Ketchup and mustard. Meat flavorings and tenderizers. Bouillon cubes. Hot sauce. Tabasco sauce. Marinades. Taco seasonings. Relishes.  Fats and Oils  Butter, stick margarine, lard, shortening, ghee, and bacon fat. Coconut, palm kernel, or palm oils. Regular salad dressings.  Other  Pickles and olives. Salted popcorn and pretzels.  The items listed above may not be a complete list of foods and beverages to avoid. Contact your dietitian for more information.  WHERE CAN I FIND MORE INFORMATION?  National Heart, Lung, and [employer] Institute: www.nhlbi.nih.gov/health/health-topics/topics/dash/  Document Released: 02/18/2011 Document Revised: 03/06/2013 Document Reviewed: 01/03/2013  ExitCare® Patient Information ©2015 [employer]. This information is not intended to replace advice given to you by your health care provider. Make sure you discuss any questions you have with your health care provider.

## 2013-09-18 NOTE — Assessment & Plan Note (Addendum)
Sugars still running over 200 at all times of the day. Sees endocrinology later today. Novolin R 40-30-45 tid with meals, and Novolin N 10 units qhs at present.

## 2013-09-18 NOTE — Progress Notes (Signed)
Subjective:    Patient ID: Michael Cunningham, male    DOB: 08/15/1945, 68 y.o.   MRN: [id number]  HPI pt returns for f/u of insulin-requiring DM (dx'ed 1999, on a routine blood test; he has mild neuropathy of the lower extremities; he is unaware of any associated chronic complications.  he has been on insulin since 2012.  pt says his diet and exercise are not very good (activity is limited by knee pain); he has never had pancreatitis, severe hypoglycemia or DKA; he says he cannot afford insulin analogs). he brings a record of his cbg's which i have reviewed today, which is scanned into the record.   It is still highest in am.   Past Medical History  Diagnosis Date  . Hypertension   . Multiple lipomas     hx. mulitple and some remains -arms, legs  . Diverticulosis of colon     LEFT SIDE  . History of small bowel obstruction     X2  28 YRS AGO AND LAST ONE 06-02-2001--  RESOLVED WITHOUT SURGICAL INTERVENTION  . Type 2 diabetes mellitus   . History of kidney stones   . Left ureteral calculus   . Complication of anesthesia     11'12 Surgery with excrutiating "head about to explode" upon awakening  -lasted several hours postop  ( SURGERY DONE 08-13-2011 AT [facility] PT DID OK POST OP)  . At risk for sleep apnea     STOP-BANG =  4   SENT TO PCP 04-26-2013  . Chicken pox as a child  . Measles as a child  . Epistaxis   . Hyperlipidemia   . Recurrent kidney stones 07/08/2013    Follows with Dr Gaynelle Arabian He believes they are Calcium oxalate stones  . Obesity, unspecified 09/18/2013    Past Surgical History  Procedure Laterality Date  . Lipoma excision  1975    multiple, 10-12  . Knee arthroscopy  08/13/2011    right ARTHROSCOPY KNEE;  Surgeon: Gearlean Alf, MD;  Location: [facility];  Service: Orthopedics;  Laterality: Right;  medial  meniscal debridement, excision of plica, synovectomy  . Knee arthroscopy w/ meniscectomy Right 01-29-2011    twice, first trimmed mediscus and cyst  . Colonoscopy w/  polypectomy  LAST ONE 2013    History   Social History  . Marital Status: Married    Spouse Name: N/A    Number of Children: N/A  . Years of Education: N/A   Occupational History  . Not on file.   Social History Main Topics  . Smoking status: Former Smoker -- 4 years    Types: Cigarettes    Quit date: 06/14/1967  . Smokeless tobacco: Never Used  . Alcohol Use: No  . Drug Use: No  . Sexual Activity: Yes     Comment: lives with wife   Other Topics Concern  . Not on file   Social History Narrative  . No narrative on file    Current Outpatient Prescriptions on File Prior to Visit  Medication Sig Dispense Refill  . acetaminophen (TYLENOL) 500 MG tablet Take 1,000 mg by mouth every 6 (six) hours as needed. For pain      . aspirin EC 81 MG tablet Take 81 mg by mouth daily with breakfast.       . glucose blood (ACCU-CHEK AVIVA) test strip 1 each by Other route 2 (two) times daily. And lancets 2/day 250.01  100 each  12  . insulin regular (NOVOLIN  R,HUMULIN R) 100 units/mL injection 3 times a day (just before each meal), 40-30-45 units.      . Insulin Syringe-Needle U-100 (BD INSULIN SYRINGE ULTRAFINE) 31G X 5/16" 0.5 ML MISC Use to inject insulin 4 times per day. Dx code 250.61  150 each  3  . Lancet Devices (ACCU-CHEK SOFTCLIX) lancets Use 2 times daily. Dx Code 250.01  100 each  6  . losartan (COZAAR) 50 MG tablet Take 1 tablet (50 mg total) by mouth daily with breakfast.  90 tablet  3  . Multiple Vitamins-Minerals (CENTRUM SILVER ULTRA MENS PO) Take 1 tablet by mouth daily.      . simvastatin (ZOCOR) 20 MG tablet Take 1 tablet (20 mg total) by mouth every evening.  90 tablet  3   No current facility-administered medications on file prior to visit.    Allergies  Allergen Reactions  . Ciprofloxacin Other (See Comments)    Abdominal pain  . Lipitor [Atorvastatin Calcium] Other (See Comments)    Severe muscle pain  . Lisinopril Cough  . Metformin And Related Diarrhea     Family History  Problem Relation Age of Onset  . Hypertension Mother   . Cancer Mother 30    ovarian  . Heart disease Father     chf  . Heart attack Father 32  . Cancer Sister     breast- just finished her last radiation  . Heart disease Maternal Grandfather     ?  [name] Heart attack Paternal Grandfather   . Diabetes Neg Hx     BP 132/78  Pulse 88  Temp(Src) 97.7 F (36.5 C) (Oral)  Ht 5' 9.5" (1.765 m)  Wt 243 lb (110.224 kg)  BMI 35.38 kg/m2  SpO2 97%    Review of Systems He denies hypoglycemia and weight change    Objective:   Physical Exam Pulses: dorsalis pedis intact bilat.   Feet: no deformity. normal color and temp.  Trace bilat leg edema.  Spotty hyperpigmentation on the legs.  There is bilateral onychomycosis Skin:  no ulcer on the feet.   Neuro: sensation is intact to touch on the feet  Lab Results  Component Value Date   HGBA1C 10.0* 07/13/2013      Assessment & Plan:  DM: moderate exacerbation. Obesity: This impairs his ability to achieve glycemic control.  I'll also work around this as best I can.     Patient is advised the following: Patient Instructions  check your blood sugar twice a day.  vary the time of day when you check, between before the 3 meals, and at bedtime.  also check if you have symptoms of your blood sugar being too high or too low.  please keep a record of the readings and bring it to your next appointment here.  You can write it on any piece of paper.  please call us sooner if your blood sugar goes below 70, or if you have a lot of readings over 200.     Please come back for a follow-up appointment in 6 weeks.    Please increase the bedtime NPH insulin to 20 units, and message [place] next week to report progress.

## 2013-09-18 NOTE — Assessment & Plan Note (Signed)
Tolerating statin, encouraged heart healthy diet, avoid trans fats, minimize simple carbs and saturated fats. Increase exercise as tolerated 

## 2013-09-20 ENCOUNTER — Telehealth: Payer: Self-pay | Admitting: Family Medicine

## 2013-09-20 NOTE — Telephone Encounter (Signed)
Received medical records from [place]

## 2013-09-21 ENCOUNTER — Ambulatory Visit: Payer: Medicare Other | Admitting: Endocrinology

## 2013-09-24 ENCOUNTER — Encounter: Payer: Self-pay | Admitting: Endocrinology

## 2013-10-02 ENCOUNTER — Encounter: Payer: Self-pay | Admitting: Endocrinology

## 2013-10-02 DIAGNOSIS — H52209 Unspecified astigmatism, unspecified eye: Secondary | ICD-10-CM | POA: Diagnosis not present

## 2013-10-02 DIAGNOSIS — E1139 Type 2 diabetes mellitus with other diabetic ophthalmic complication: Secondary | ICD-10-CM | POA: Diagnosis not present

## 2013-10-02 DIAGNOSIS — H2510 Age-related nuclear cataract, unspecified eye: Secondary | ICD-10-CM | POA: Diagnosis not present

## 2013-10-02 LAB — HM DIABETES EYE EXAM

## 2013-10-09 ENCOUNTER — Telehealth: Payer: Self-pay

## 2013-10-09 DIAGNOSIS — M25569 Pain in unspecified knee: Secondary | ICD-10-CM | POA: Diagnosis not present

## 2013-10-09 NOTE — Telephone Encounter (Signed)
Patient left a message stating that he would like to know if his records have came from [facility] ([facility])? Pt stated in message he has requested these twice?  Please advise? I don't see them in the chart?

## 2013-10-09 NOTE — Telephone Encounter (Signed)
Can we just confirm he signed for the release and then call over to try and get them again if they are not in chart please

## 2013-10-10 NOTE — Telephone Encounter (Signed)
Can you please check on this?

## 2013-10-15 NOTE — Telephone Encounter (Signed)
Found some papers from [place] but the most recent are from January is this what the patient is asking about?

## 2013-10-15 NOTE — Telephone Encounter (Signed)
Can you request his results please?

## 2013-10-15 NOTE — Telephone Encounter (Signed)
Please see 09/20/13 phone note.

## 2013-10-16 NOTE — Telephone Encounter (Signed)
Pt states he is wandering if we have received all his records? I informed him that it looks like it is only labwork.   Pt states he is going to call to get the rest of his records faxed due to information of shot records are in this.

## 2013-10-18 DIAGNOSIS — M25569 Pain in unspecified knee: Secondary | ICD-10-CM | POA: Diagnosis not present

## 2013-10-25 DIAGNOSIS — Z5189 Encounter for other specified aftercare: Secondary | ICD-10-CM | POA: Diagnosis not present

## 2013-10-30 ENCOUNTER — Ambulatory Visit: Payer: Medicare Other | Admitting: Endocrinology

## 2013-11-06 NOTE — Progress Notes (Signed)
To Arlee Muslim, PA - Please enter preop orders in epic for [employer] - he is coming to [facility] on 9/1 for his preop / labs.  Thanks.

## 2013-11-07 ENCOUNTER — Encounter: Payer: Self-pay | Admitting: Family Medicine

## 2013-11-07 ENCOUNTER — Encounter: Payer: Self-pay | Admitting: Endocrinology

## 2013-11-09 ENCOUNTER — Encounter (HOSPITAL_COMMUNITY): Payer: Self-pay | Admitting: Pharmacy Technician

## 2013-11-12 NOTE — Patient Instructions (Signed)
Michael Cunningham  11/12/2013   Your procedure is scheduled on:  11/16/2013    Report to [facility].  Follow the Signs to [place] at  100pm      Call this number if you have problems the morning of surgery: [phone]   Remember:   Do not eat food after midnite.  May have clear liquids until 0830 then npo.     Take these medicines the morning of surgery with A SIP OF WATER:    Do not wear jewelry,   Do not wear lotions, powders, or perfumes. , deodorant  . Men may shave face and neck.  Do not bring valuables to the hospital.  Contacts, dentures or bridgework may not be worn into surgery.       CLEAR LIQUID DIET   Foods Allowed                                                                     Foods Excluded  Coffee and tea, regular and decaf                             liquids that you cannot  Plain Jell-O in any flavor                                             see through such as: Fruit ices (not with fruit pulp)                                     milk, soups, orange juice  Iced Popsicles                                    All solid food Carbonated beverages, regular and diet                                    Cranberry, grape and apple juices Sports drinks like Gatorade Lightly seasoned clear broth or consume(fat free) Sugar, honey syrup  Sample Menu Breakfast                                Lunch                                     Supper Cranberry juice                    Beef broth                            Chicken broth Jell-O  Grape juice                           Apple juice Coffee or tea                        Jell-O                                      Popsicle                                                Coffee or tea                        Coffee or tea  _____________________________________________________________________  [facility] - Preparing for Surgery Before surgery, you can play an important  role.  Because skin is not sterile, your skin needs to be as free of germs as possible.  You can reduce the number of germs on your skin by washing with CHG (chlorahexidine gluconate) soap before surgery.  CHG is an antiseptic cleaner which kills germs and bonds with the skin to continue killing germs even after washing. Please DO NOT use if you have an allergy to CHG or antibacterial soaps.  If your skin becomes reddened/irritated stop using the CHG and inform your nurse when you arrive at Short Stay. Do not shave (including legs and underarms) for at least 48 hours prior to the first CHG shower.  You may shave your face/neck. Please follow these instructions carefully:  1.  Shower with CHG Soap the night before surgery and the  morning of Surgery.  2.  If you choose to wash your hair, wash your hair first as usual with your  normal  shampoo.  3.  After you shampoo, rinse your hair and body thoroughly to remove the  shampoo.                           4.  Use CHG as you would any other liquid soap.  You can apply chg directly  to the skin and wash                       Gently with a scrungie or clean washcloth.  5.  Apply the CHG Soap to your body ONLY FROM THE NECK DOWN.   Do not use on face/ open                           Wound or open sores. Avoid contact with eyes, ears mouth and genitals (private parts).                       Wash face,  Genitals (private parts) with your normal soap.             6.  Wash thoroughly, paying special attention to the area where your surgery  will be performed.  7.  Thoroughly rinse your body with warm water from the neck down.  8.  DO NOT shower/wash with your normal soap after using and rinsing off  the CHG  Soap.                9.  Pat yourself dry with a clean towel.            10.  Wear clean pajamas.            11.  Place clean sheets on your bed the night of your first shower and do not  sleep with pets. Day of Surgery : Do not apply any lotions/deodorants  the morning of surgery.  Please wear clean clothes to the hospital/surgery center.  FAILURE TO FOLLOW THESE INSTRUCTIONS MAY RESULT IN THE CANCELLATION OF YOUR SURGERY PATIENT SIGNATURE_________________________________  NURSE SIGNATURE__________________________________  ________________________________________________________________________    Patients discharged the day of surgery will not be allowed to drive  home.  Name and phone number of your driver:     Please read over the following fact sheets that you were given: , coughing and deep breathing exercises, leg exercises

## 2013-11-12 NOTE — Progress Notes (Signed)
Need orders in EPIC.  Surgery on 11/16/2013.  Preop on 11/13/2013 at 1000am.  Thank You.

## 2013-11-13 ENCOUNTER — Other Ambulatory Visit: Payer: Self-pay | Admitting: Orthopedic Surgery

## 2013-11-13 ENCOUNTER — Encounter (HOSPITAL_COMMUNITY): Payer: Self-pay

## 2013-11-13 ENCOUNTER — Encounter (HOSPITAL_COMMUNITY)
Admission: RE | Admit: 2013-11-13 | Discharge: 2013-11-13 | Disposition: A | Discharge status: Home / Self Care | Payer: Medicare Other | Source: Ambulatory Visit | Attending: Orthopedic Surgery | Admitting: Orthopedic Surgery

## 2013-11-13 DIAGNOSIS — I10 Essential (primary) hypertension: Secondary | ICD-10-CM | POA: Diagnosis not present

## 2013-11-13 DIAGNOSIS — N200 Calculus of kidney: Secondary | ICD-10-CM | POA: Diagnosis not present

## 2013-11-13 DIAGNOSIS — Z794 Long term (current) use of insulin: Secondary | ICD-10-CM | POA: Diagnosis not present

## 2013-11-13 DIAGNOSIS — E119 Type 2 diabetes mellitus without complications: Secondary | ICD-10-CM | POA: Diagnosis not present

## 2013-11-13 DIAGNOSIS — M2240 Chondromalacia patellae, unspecified knee: Secondary | ICD-10-CM | POA: Diagnosis not present

## 2013-11-13 DIAGNOSIS — Z87891 Personal history of nicotine dependence: Secondary | ICD-10-CM | POA: Diagnosis not present

## 2013-11-13 DIAGNOSIS — M9420 Chondromalacia, unspecified site: Secondary | ICD-10-CM | POA: Diagnosis not present

## 2013-11-13 DIAGNOSIS — E669 Obesity, unspecified: Secondary | ICD-10-CM | POA: Diagnosis not present

## 2013-11-13 DIAGNOSIS — E785 Hyperlipidemia, unspecified: Secondary | ICD-10-CM | POA: Diagnosis not present

## 2013-11-13 DIAGNOSIS — IMO0002 Reserved for concepts with insufficient information to code with codable children: Secondary | ICD-10-CM | POA: Diagnosis not present

## 2013-11-13 DIAGNOSIS — M959 Acquired deformity of musculoskeletal system, unspecified: Secondary | ICD-10-CM | POA: Diagnosis not present

## 2013-11-13 DIAGNOSIS — Z6835 Body mass index (BMI) 35.0-35.9, adult: Secondary | ICD-10-CM | POA: Diagnosis not present

## 2013-11-13 DIAGNOSIS — K5730 Diverticulosis of large intestine without perforation or abscess without bleeding: Secondary | ICD-10-CM | POA: Diagnosis not present

## 2013-11-13 LAB — CBC
HEMATOCRIT: 43.5 % (ref 39.0–52.0)
Hemoglobin: 15 g/dL (ref 13.0–17.0)
MCH: 31.1 pg (ref 26.0–34.0)
MCHC: 34.5 g/dL (ref 30.0–36.0)
MCV: 90.1 fL (ref 78.0–100.0)
Platelets: 172 10*3/uL (ref 150–400)
RBC: 4.83 MIL/uL (ref 4.22–5.81)
RDW: 13 % (ref 11.5–15.5)
WBC: 5.7 10*3/uL (ref 4.0–10.5)

## 2013-11-13 LAB — BASIC METABOLIC PANEL
ANION GAP: 13 (ref 5–15)
BUN: 17 mg/dL (ref 6–23)
CALCIUM: 9.3 mg/dL (ref 8.4–10.5)
CO2: 26 meq/L (ref 19–32)
CREATININE: 0.78 mg/dL (ref 0.50–1.35)
Chloride: 99 mEq/L (ref 96–112)
GFR calc Af Amer: >90 mL/min (ref >90)
GFR calc non Af Amer: >90 mL/min (ref >90)
Glucose, Bld: 254 mg/dL — ABNORMAL HIGH (ref 70–99)
Potassium: 4.8 mEq/L (ref 3.7–5.3)
Sodium: 138 mEq/L (ref 137–147)

## 2013-11-13 NOTE — Progress Notes (Signed)
BMP faxed via EPIC to Dr Wynelle Link.

## 2013-11-13 NOTE — Progress Notes (Signed)
Preoperative surgical orders have been place into the Epic hospital system for [place] on 11/13/2013, 9:53 AM  by Mickel Crow for surgery on 11/16/2013.  Preop Knee Scope orders including IV Tylenol and IV Decadron as long as there are no contraindications to the above medications. Arlee Muslim, PA-C

## 2013-11-14 NOTE — Progress Notes (Signed)
Patient called and made aware to arrive at 1100am.  Surgery at 1300pm on 11/16/2013.  Clear Liquid diet until 0700am then npo.  Patient voiced understanding.

## 2013-11-15 ENCOUNTER — Other Ambulatory Visit: Payer: Self-pay | Admitting: Orthopedic Surgery

## 2013-11-16 ENCOUNTER — Encounter (HOSPITAL_COMMUNITY): Payer: Medicare Other | Admitting: Certified Registered Nurse Anesthetist

## 2013-11-16 ENCOUNTER — Ambulatory Visit (HOSPITAL_COMMUNITY): Payer: Medicare Other | Admitting: Certified Registered Nurse Anesthetist

## 2013-11-16 ENCOUNTER — Encounter (HOSPITAL_COMMUNITY): Payer: Self-pay | Admitting: *Deleted

## 2013-11-16 ENCOUNTER — Encounter (HOSPITAL_COMMUNITY)
Admission: RE | Disposition: A | Discharge status: Home / Self Care | Payer: Self-pay | Source: Ambulatory Visit | Attending: Orthopedic Surgery

## 2013-11-16 ENCOUNTER — Ambulatory Visit (HOSPITAL_COMMUNITY)
Admission: RE | Admit: 2013-11-16 | Discharge: 2013-11-16 | Disposition: A | Discharge status: Home / Self Care | Payer: Medicare Other | Source: Ambulatory Visit | Attending: Orthopedic Surgery | Admitting: Orthopedic Surgery

## 2013-11-16 DIAGNOSIS — M9420 Chondromalacia, unspecified site: Secondary | ICD-10-CM | POA: Insufficient documentation

## 2013-11-16 DIAGNOSIS — K5730 Diverticulosis of large intestine without perforation or abscess without bleeding: Secondary | ICD-10-CM | POA: Insufficient documentation

## 2013-11-16 DIAGNOSIS — Z794 Long term (current) use of insulin: Secondary | ICD-10-CM | POA: Insufficient documentation

## 2013-11-16 DIAGNOSIS — IMO0002 Reserved for concepts with insufficient information to code with codable children: Secondary | ICD-10-CM | POA: Insufficient documentation

## 2013-11-16 DIAGNOSIS — E669 Obesity, unspecified: Secondary | ICD-10-CM | POA: Insufficient documentation

## 2013-11-16 DIAGNOSIS — M959 Acquired deformity of musculoskeletal system, unspecified: Secondary | ICD-10-CM | POA: Insufficient documentation

## 2013-11-16 DIAGNOSIS — M2240 Chondromalacia patellae, unspecified knee: Secondary | ICD-10-CM | POA: Insufficient documentation

## 2013-11-16 DIAGNOSIS — S83242D Other tear of medial meniscus, current injury, left knee, subsequent encounter: Secondary | ICD-10-CM

## 2013-11-16 DIAGNOSIS — N200 Calculus of kidney: Secondary | ICD-10-CM | POA: Insufficient documentation

## 2013-11-16 DIAGNOSIS — Z6835 Body mass index (BMI) 35.0-35.9, adult: Secondary | ICD-10-CM | POA: Insufficient documentation

## 2013-11-16 DIAGNOSIS — I10 Essential (primary) hypertension: Secondary | ICD-10-CM | POA: Insufficient documentation

## 2013-11-16 DIAGNOSIS — E785 Hyperlipidemia, unspecified: Secondary | ICD-10-CM | POA: Insufficient documentation

## 2013-11-16 DIAGNOSIS — E119 Type 2 diabetes mellitus without complications: Secondary | ICD-10-CM | POA: Insufficient documentation

## 2013-11-16 DIAGNOSIS — M23305 Other meniscus derangements, unspecified medial meniscus, unspecified knee: Secondary | ICD-10-CM | POA: Diagnosis not present

## 2013-11-16 DIAGNOSIS — Z87891 Personal history of nicotine dependence: Secondary | ICD-10-CM | POA: Insufficient documentation

## 2013-11-16 HISTORY — PX: KNEE ARTHROSCOPY: SHX127

## 2013-11-16 LAB — GLUCOSE, CAPILLARY
GLUCOSE-CAPILLARY: 211 mg/dL — AB (ref 70–99)
Glucose-Capillary: 181 mg/dL — ABNORMAL HIGH (ref 70–99)

## 2013-11-16 SURGERY — ARTHROSCOPY, KNEE
Anesthesia: General | Site: Knee | Laterality: Left

## 2013-11-16 MED ORDER — FENTANYL CITRATE 0.05 MG/ML IJ SOLN
INTRAMUSCULAR | Status: AC
  Filled 2013-11-16: qty 2

## 2013-11-16 MED ORDER — LACTATED RINGERS IR SOLN
Status: DC | PRN
  Administered 2013-11-16: 6000 mL

## 2013-11-16 MED ORDER — FENTANYL CITRATE 0.05 MG/ML IJ SOLN
25.0000 ug | INTRAMUSCULAR | Status: DC | PRN
  Administered 2013-11-16 (×3): 50 ug via INTRAVENOUS

## 2013-11-16 MED ORDER — SODIUM CHLORIDE 0.9 % IV SOLN: INTRAVENOUS | Status: DC

## 2013-11-16 MED ORDER — HYDROCODONE-ACETAMINOPHEN 5-325 MG PO TABS: 1.0000 | ORAL_TABLET | Freq: Four times a day (QID) | ORAL | Status: DC | PRN

## 2013-11-16 MED ORDER — PROMETHAZINE HCL 25 MG/ML IJ SOLN: 6.2500 mg | INTRAMUSCULAR | Status: DC | PRN

## 2013-11-16 MED ORDER — LIDOCAINE HCL (CARDIAC) 20 MG/ML IV SOLN
INTRAVENOUS | Status: AC
  Filled 2013-11-16: qty 5

## 2013-11-16 MED ORDER — DEXAMETHASONE SODIUM PHOSPHATE 10 MG/ML IJ SOLN
INTRAMUSCULAR | Status: AC
  Filled 2013-11-16: qty 1

## 2013-11-16 MED ORDER — CEFAZOLIN SODIUM-DEXTROSE 2-3 GM-% IV SOLR
2.0000 g | INTRAVENOUS | Status: AC
  Administered 2013-11-16: 2 g via INTRAVENOUS

## 2013-11-16 MED ORDER — KETOROLAC TROMETHAMINE 30 MG/ML IJ SOLN
30.0000 mg | Freq: Once | INTRAMUSCULAR | Status: AC
  Administered 2013-11-16: 30 mg via INTRAVENOUS

## 2013-11-16 MED ORDER — HYDROCODONE-ACETAMINOPHEN 5-325 MG PO TABS
1.0000 | ORAL_TABLET | Freq: Four times a day (QID) | ORAL | Status: DC | PRN
  Administered 2013-11-16: 1 via ORAL
  Filled 2013-11-16: qty 1

## 2013-11-16 MED ORDER — BUPIVACAINE-EPINEPHRINE (PF) 0.25% -1:200000 IJ SOLN
INTRAMUSCULAR | Status: AC
  Filled 2013-11-16: qty 30

## 2013-11-16 MED ORDER — CHLORHEXIDINE GLUCONATE 4 % EX LIQD: 60.0000 mL | Freq: Once | CUTANEOUS | Status: DC

## 2013-11-16 MED ORDER — MIDAZOLAM HCL 5 MG/5ML IJ SOLN
INTRAMUSCULAR | Status: DC | PRN
  Administered 2013-11-16: 2 mg via INTRAVENOUS

## 2013-11-16 MED ORDER — PROPOFOL 10 MG/ML IV BOLUS
INTRAVENOUS | Status: AC
  Filled 2013-11-16: qty 20

## 2013-11-16 MED ORDER — KETOROLAC TROMETHAMINE 30 MG/ML IJ SOLN
INTRAMUSCULAR | Status: AC
  Filled 2013-11-16: qty 1

## 2013-11-16 MED ORDER — DEXAMETHASONE SODIUM PHOSPHATE 10 MG/ML IJ SOLN
10.0000 mg | Freq: Once | INTRAMUSCULAR | Status: AC
  Administered 2013-11-16: 10 mg via INTRAVENOUS

## 2013-11-16 MED ORDER — MIDAZOLAM HCL 2 MG/2ML IJ SOLN
INTRAMUSCULAR | Status: AC
  Filled 2013-11-16: qty 2

## 2013-11-16 MED ORDER — METHOCARBAMOL 500 MG PO TABS: 500.0000 mg | ORAL_TABLET | Freq: Four times a day (QID) | ORAL | Status: DC

## 2013-11-16 MED ORDER — ONDANSETRON HCL 4 MG/2ML IJ SOLN
INTRAMUSCULAR | Status: AC
  Filled 2013-11-16: qty 2

## 2013-11-16 MED ORDER — FENTANYL CITRATE 0.05 MG/ML IJ SOLN
INTRAMUSCULAR | Status: DC | PRN
  Administered 2013-11-16 (×4): 50 ug via INTRAVENOUS

## 2013-11-16 MED ORDER — ESMOLOL HCL 10 MG/ML IV SOLN
INTRAVENOUS | Status: DC | PRN
  Administered 2013-11-16: 20 mg via INTRAVENOUS

## 2013-11-16 MED ORDER — BUPIVACAINE-EPINEPHRINE 0.25% -1:200000 IJ SOLN
INTRAMUSCULAR | Status: DC | PRN
  Administered 2013-11-16: 20 mL

## 2013-11-16 MED ORDER — PROPOFOL 10 MG/ML IV BOLUS
INTRAVENOUS | Status: DC | PRN
  Administered 2013-11-16: 50 mg via INTRAVENOUS

## 2013-11-16 MED ORDER — ACETAMINOPHEN 10 MG/ML IV SOLN
1000.0000 mg | Freq: Once | INTRAVENOUS | Status: AC
  Administered 2013-11-16: 1000 mg via INTRAVENOUS
  Filled 2013-11-16: qty 100

## 2013-11-16 MED ORDER — LACTATED RINGERS IV SOLN
INTRAVENOUS | Status: DC
  Administered 2013-11-16: 1000 mL via INTRAVENOUS
  Administered 2013-11-16: 15:00:00 via INTRAVENOUS

## 2013-11-16 MED ORDER — ESMOLOL HCL 10 MG/ML IV SOLN
INTRAVENOUS | Status: AC
  Filled 2013-11-16: qty 10

## 2013-11-16 MED ORDER — LIDOCAINE HCL (CARDIAC) 20 MG/ML IV SOLN
INTRAVENOUS | Status: DC | PRN
  Administered 2013-11-16: 50 mg via INTRAVENOUS

## 2013-11-16 MED ORDER — MEPERIDINE HCL 50 MG/ML IJ SOLN: 6.2500 mg | INTRAMUSCULAR | Status: DC | PRN

## 2013-11-16 MED ORDER — CEFAZOLIN SODIUM-DEXTROSE 2-3 GM-% IV SOLR
INTRAVENOUS | Status: AC
  Filled 2013-11-16: qty 50

## 2013-11-16 SURGICAL SUPPLY — 27 items
BLADE 4.2CUDA (BLADE) ×2 IMPLANT
CLOTH BEACON ORANGE TIMEOUT ST (SAFETY) ×2 IMPLANT
COUNTER NEEDLE 20 DBL MAG RED (NEEDLE) ×2 IMPLANT
CUFF TOURN SGL QUICK 34 (TOURNIQUET CUFF) ×1
CUFF TRNQT CYL 34X4X40X1 (TOURNIQUET CUFF) ×1 IMPLANT
DRAPE U-SHAPE 47X51 STRL (DRAPES) ×2 IMPLANT
DRSG EMULSION OIL 3X3 NADH (GAUZE/BANDAGES/DRESSINGS) ×2 IMPLANT
DURAPREP 26ML APPLICATOR (WOUND CARE) ×2 IMPLANT
GLOVE BIO SURGEON STRL SZ7.5 (GLOVE) ×2 IMPLANT
GLOVE BIO SURGEON STRL SZ8 (GLOVE) ×2 IMPLANT
GLOVE BIOGEL PI IND STRL 7.5 (GLOVE) ×1 IMPLANT
GLOVE BIOGEL PI IND STRL 8 (GLOVE) ×1 IMPLANT
GLOVE BIOGEL PI INDICATOR 7.5 (GLOVE) ×1
GLOVE BIOGEL PI INDICATOR 8 (GLOVE) ×1
GOWN STRL REUS W/TWL LRG LVL3 (GOWN DISPOSABLE) ×2 IMPLANT
GOWN STRL REUS W/TWL XL LVL3 (GOWN DISPOSABLE) ×2 IMPLANT
MANIFOLD NEPTUNE II (INSTRUMENTS) ×2 IMPLANT
PACK ARTHROSCOPY WL (CUSTOM PROCEDURE TRAY) ×2 IMPLANT
PACK ICE MAXI GEL EZY WRAP (MISCELLANEOUS) ×6 IMPLANT
PADDING CAST COTTON 6X4 STRL (CAST SUPPLIES) ×4 IMPLANT
POSITIONER SURGICAL ARM (MISCELLANEOUS) ×2 IMPLANT
SET ARTHROSCOPY TUBING (MISCELLANEOUS) ×1
SET ARTHROSCOPY TUBING LN (MISCELLANEOUS) ×1 IMPLANT
SUT ETHILON 4 0 PS 2 18 (SUTURE) ×2 IMPLANT
TOWEL OR 17X26 10 PK STRL BLUE (TOWEL DISPOSABLE) ×2 IMPLANT
WAND 90 DEG TURBOVAC W/CORD (SURGICAL WAND) ×2 IMPLANT
WRAP KNEE MAXI GEL POST OP (GAUZE/BANDAGES/DRESSINGS) ×2 IMPLANT

## 2013-11-16 NOTE — Anesthesia Preprocedure Evaluation (Signed)
Anesthesia Evaluation  Patient identified by MRN, date of birth, ID band Patient awake    Reviewed: Allergy & Precautions, H&P , NPO status , Patient's Chart, lab work & pertinent test results  History of Anesthesia Complications (+) history of anesthetic complications  Airway Mallampati: II TM Distance: >3 FB Neck ROM: Full    Dental no notable dental hx.    Pulmonary neg pulmonary ROS, former smoker,  breath sounds clear to auscultation  Pulmonary exam normal       Cardiovascular Exercise Tolerance: Good hypertension, Pt. on medications Rhythm:Regular Rate:Normal     Neuro/Psych negative neurological ROS  negative psych ROS   GI/Hepatic negative GI ROS, Neg liver ROS,   Endo/Other  diabetes, Type 2, Insulin Dependent, Oral Hypoglycemic Agents  Renal/GU negative Renal ROS  negative genitourinary   Musculoskeletal negative musculoskeletal ROS (+)   Abdominal (+) + obese,   Peds negative pediatric ROS (+)  Hematology negative hematology ROS (+)   Anesthesia Other Findings   Reproductive/Obstetrics negative OB ROS                           Anesthesia Physical  Anesthesia Plan  ASA: III  Anesthesia Plan: General   Post-op Pain Management:    Induction: Intravenous  Airway Management Planned: LMA  Additional Equipment:   Intra-op Plan:   Post-operative Plan: Extubation in OR  Informed Consent: I have reviewed the patients History and Physical, chart, labs and discussed the procedure including the risks, benefits and alternatives for the proposed anesthesia with the patient or authorized representative who has indicated his/her understanding and acceptance.   Dental advisory given  Plan Discussed with: CRNA  Anesthesia Plan Comments: (Anesthesia record from [facility] reviewed. Patient had a severe headache upon emergence.  Last GA here was uneventful. Will replicate)         Anesthesia Quick Evaluation

## 2013-11-16 NOTE — Anesthesia Postprocedure Evaluation (Signed)
  Anesthesia Post-op Note  Patient: Michael Cunningham  Procedure(s) Performed: Procedure(s) (LRB): LEFT KNEE ARTHROSCOPY MEDIAL MENISCAL DEBRIDEMENT, CHONDROPLASTY (Left)  Patient Location: PACU  Anesthesia Type: General  Level of Consciousness: awake and alert   Airway and Oxygen Therapy: Patient Spontanous Breathing  Post-op Pain: mild  Post-op Assessment: Post-op Vital signs reviewed, Patient's Cardiovascular Status Stable, Respiratory Function Stable, Patent Airway and No signs of Nausea or vomiting  Last Vitals:  Filed Vitals:   11/16/13 1634  BP: 168/79  Pulse: 84  Temp: 36.2 C  Resp: 16    Post-op Vital Signs: stable   Complications: No apparent anesthesia complications

## 2013-11-16 NOTE — Progress Notes (Signed)
Notified Dr Thomes Cake of patient's CBG pre-op. No new orders given.

## 2013-11-16 NOTE — Interval H&P Note (Signed)
History and Physical Interval Note:  11/16/2013 1:13 PM  Michael Cunningham  has presented today for surgery, with the diagnosis of LEFT KNEE MEDIAL MENSICUS TEAR   The various methods of treatment have been discussed with the patient and family. After consideration of risks, benefits and other options for treatment, the patient has consented to  Procedure(s): LEFT KNEE ARTHROSCOPY MEDIAL MENISCAL DEBRIDEMENT (Left) as a surgical intervention .  The patient's history has been reviewed, patient examined, no change in status, stable for surgery.  I have reviewed the patient's chart and labs.  Questions were answered to the patient's satisfaction.     Gearlean Alf

## 2013-11-16 NOTE — H&P (Signed)
CC- Michael Cunningham is a 68 y.o. male who presents with left knee pain.  HPI- . Knee Pain: Patient presents with knee pain involving the  left knee. Onset of the symptoms was several weeks ago. Inciting event: none known. Current symptoms include pain located medially, stiffness and swelling. Pain is aggravated by any weight bearing, inactivity, lateral movements and walking.  Patient has had prior knee problems. Evaluation to date: MRI: abnormal medial meniscal tear. Treatment to date: rest.  Past Medical History  Diagnosis Date  . Hypertension   . Multiple lipomas     hx. mulitple and some remains -arms, legs  . Diverticulosis of colon     LEFT SIDE  . History of small bowel obstruction     X2  28 YRS AGO AND LAST ONE 06-02-2001--  RESOLVED WITHOUT SURGICAL INTERVENTION  . Type 2 diabetes mellitus   . History of kidney stones   . Left ureteral calculus   . Complication of anesthesia     11'12 Surgery with excrutiating "head about to explode" upon awakening  -lasted several hours postop  ( SURGERY DONE 08-13-2011 AT [facility] PT DID OK POST OP)  . At risk for sleep apnea     STOP-BANG =  4   SENT TO PCP 04-26-2013  . Chicken pox as a child  . Measles as a child  . Epistaxis   . Hyperlipidemia   . Recurrent kidney stones 07/08/2013    Follows with Dr Gaynelle Arabian He believes they are Calcium oxalate stones  . Obesity, unspecified 09/18/2013    Past Surgical History  Procedure Laterality Date  . Lipoma excision  1975    multiple, 10-12  . Knee arthroscopy  08/13/2011    right ARTHROSCOPY KNEE;  Surgeon: Gearlean Alf, MD;  Location: [facility];  Service: Orthopedics;  Laterality: Right;  medial  meniscal debridement, excision of plica, synovectomy  . Knee arthroscopy w/ meniscectomy Right 01-29-2011    twice, first trimmed mediscus and cyst  . Colonoscopy w/ polypectomy  LAST ONE 2013    Prior to Admission medications   Medication Sig Start Date End Date Taking? Authorizing Provider   ibuprofen (ADVIL,MOTRIN) 200 MG tablet Take 400 mg by mouth every 6 (six) hours as needed for mild pain.   Yes Historical Provider, MD  insulin NPH Human (HUMULIN N,NOVOLIN N) 100 UNIT/ML injection Inject 25 Units into the skin at bedtime.  08/31/13  Yes Renato Shin, MD  insulin regular (NOVOLIN R,HUMULIN R) 100 units/mL injection Inject 30-45 Units into the skin 3 (three) times daily before meals. 3 times a day (just before each meal), 40-30-45 units. 08/08/13  Yes Renato Shin, MD  losartan (COZAAR) 50 MG tablet Take 1 tablet (50 mg total) by mouth daily with breakfast. 09/18/13  Yes Mosie Lukes, MD  simvastatin (ZOCOR) 20 MG tablet Take 1 tablet (20 mg total) by mouth every evening. 09/18/13  Yes Mosie Lukes, MD  aspirin EC 81 MG tablet Take 81 mg by mouth daily with breakfast.     Historical Provider, MD  Multiple Vitamin (MULTIVITAMIN WITH MINERALS) TABS tablet Take 1 tablet by mouth daily.    Historical Provider, MD   KNEE EXAM antalgic gait, no effusion, negative drawer sign, collateral ligaments intact, medial joint line tenderness  Physical Examination: General appearance - alert, well appearing, and in no distress Mental status - alert, oriented to person, place, and time Chest - clear to auscultation, no wheezes, rales or rhonchi, symmetric air entry  Heart - normal rate, regular rhythm, normal S1, S2, no murmurs, rubs, clicks or gallops Abdomen - soft, nontender, nondistended, no masses or organomegaly Neurological - alert, oriented, normal speech, no focal findings or movement disorder noted   Asessment/Plan--- Left knee medial meniscal tear- - Plan left knee arthroscopy with meniscal debridement. Procedure risks and potential comps discussed with patient who elects to proceed. Goals are decreased pain and increased function with a high likelihood of achieving both

## 2013-11-16 NOTE — Discharge Instructions (Signed)

## 2013-11-16 NOTE — Op Note (Signed)
Preoperative diagnosis-  Left knee medial meniscal tear  Postoperative diagnosis Left- knee medial meniscal tear plus  Medial femoral chondral defect  Procedure- Left knee arthroscopy with medial meniscal debridement and chondroplasty   Surgeon- Dione Plover. Penda Venturi, MD  Anesthesia-General  EBL-  Minimal  Complications- None  Condition- PACU - hemodynamically stable.  Brief clinical note- -Michael Cunningham is a 68 y.o.  male with a several month history of left knee pain and mechanical symptoms. Exam and history suggested medial meniscal tear confirmed by MRI. The patient presents now for arthroscopy and debridement   Procedure in detail -       After successful administration of General anesthetic, a tourmiquet is placed high on the Left  thigh and the Left lower extremity is prepped and draped in the usual sterile fashion. Time out is performed by the surgical team. Standard superomedial and inferolateral portal sites are marked and incisions made with an 11 blade. The inflow cannula is passed through the superomedial portal and camera through the inferolateral portal and inflow is initiated. Arthroscopic visualization proceeds.      The undersurface of the patella and trochlea are visualized and there is grade II chondromalacia of both surfaces with no unstable defects. The medial and lateral gutters are visualized and there are  no loose bodies. Flexion and valgus force is applied to the knee and the medial compartment is entered. A spinal needle is passed into the joint through the site marked for the inferomedial portal. A small incision is made and the dilator passed into the joint. The findings for the medial compartment are unstable tear body and posterior horn medial meniscus with 1 x 2 cm chondral lesion medial femoral condyle . The tear is debrided to a stable base with baskets and a shaver and sealed off with the Arthrocare. The shaver is used to debride the unstable cartilage to a  stable bony base with stable edges. It is probed and found to be stable.    The intercondylar notch is visualized and the ACL appears normal. The lateral compartment is entered and the findings are normal .      The joint is again inspected and there are no other tears, defects or loose bodies identified. The arthroscopic equipment is then removed from the inferior portals which are closed with interrupted 4-0 nylon. 20 ml of .25% Marcaine with epinephrine are injected through the inflow cannula and the cannula is then removed and the portal closed with nylon. The incisions are cleaned and dried and a bulky sterile dressing is applied. The patient is then awakened and transported to recovery in stable condition.   11/16/2013, 2:03 PM

## 2013-11-16 NOTE — Transfer of Care (Signed)
Immediate Anesthesia Transfer of Care Note  Patient: Michael Cunningham  Procedure(s) Performed: Procedure(s) (LRB): LEFT KNEE ARTHROSCOPY MEDIAL MENISCAL DEBRIDEMENT, CHONDROPLASTY (Left)  Patient Location: PACU  Anesthesia Type: General  Level of Consciousness: sedated, patient cooperative and responds to stimulation  Airway & Oxygen Therapy: Patient Spontanous Breathing and Patient connected to face mask oxgen  Post-op Assessment: Report given to PACU RN and Post -op Vital signs reviewed and stable  Post vital signs: Reviewed and stable  Complications: No apparent anesthesia complications

## 2013-11-20 ENCOUNTER — Encounter (HOSPITAL_COMMUNITY): Payer: Self-pay | Admitting: Orthopedic Surgery

## 2013-11-22 DIAGNOSIS — Z4789 Encounter for other orthopedic aftercare: Secondary | ICD-10-CM | POA: Diagnosis not present

## 2013-12-11 DIAGNOSIS — M25569 Pain in unspecified knee: Secondary | ICD-10-CM | POA: Diagnosis not present

## 2013-12-11 DIAGNOSIS — Z5189 Encounter for other specified aftercare: Secondary | ICD-10-CM | POA: Diagnosis not present

## 2013-12-11 DIAGNOSIS — Z4789 Encounter for other orthopedic aftercare: Secondary | ICD-10-CM | POA: Diagnosis not present

## 2013-12-25 ENCOUNTER — Telehealth: Payer: Self-pay | Admitting: Family Medicine

## 2013-12-25 NOTE — Telephone Encounter (Signed)
Lab order for 12-26-2013 lipid, renal, cbc, tsh, hepatic, hgba1c prior. Diagnostic follow-up:

## 2013-12-26 ENCOUNTER — Other Ambulatory Visit (INDEPENDENT_AMBULATORY_CARE_PROVIDER_SITE_OTHER): Payer: Medicare Other

## 2013-12-26 DIAGNOSIS — I10 Essential (primary) hypertension: Secondary | ICD-10-CM | POA: Diagnosis not present

## 2013-12-26 DIAGNOSIS — IMO0002 Reserved for concepts with insufficient information to code with codable children: Secondary | ICD-10-CM

## 2013-12-26 DIAGNOSIS — E785 Hyperlipidemia, unspecified: Secondary | ICD-10-CM

## 2013-12-26 DIAGNOSIS — E669 Obesity, unspecified: Secondary | ICD-10-CM | POA: Diagnosis not present

## 2013-12-26 DIAGNOSIS — E1165 Type 2 diabetes mellitus with hyperglycemia: Secondary | ICD-10-CM | POA: Diagnosis not present

## 2013-12-26 LAB — RENAL FUNCTION PANEL
Albumin: 3.4 g/dL — ABNORMAL LOW (ref 3.5–5.2)
BUN: 17 mg/dL (ref 6–23)
CALCIUM: 8.9 mg/dL (ref 8.4–10.5)
CHLORIDE: 103 meq/L (ref 96–112)
CO2: 27 meq/L (ref 19–32)
Creatinine, Ser: 0.9 mg/dL (ref 0.4–1.5)
GFR: 89.95 mL/min (ref >60.00)
GLUCOSE: 179 mg/dL — AB (ref 70–99)
Phosphorus: 3.3 mg/dL (ref 2.3–4.6)
Potassium: 4.7 mEq/L (ref 3.5–5.1)
SODIUM: 136 meq/L (ref 135–145)

## 2013-12-26 LAB — CBC WITH DIFFERENTIAL/PLATELET
Basophils Absolute: 0 10*3/uL (ref 0.0–0.1)
Basophils Relative: 0.5 % (ref 0.0–3.0)
Eosinophils Absolute: 0.2 10*3/uL (ref 0.0–0.7)
Eosinophils Relative: 4 % (ref 0.0–5.0)
HEMATOCRIT: 43.7 % (ref 39.0–52.0)
Hemoglobin: 14.6 g/dL (ref 13.0–17.0)
Lymphocytes Relative: 34 % (ref 12.0–46.0)
Lymphs Abs: 1.8 10*3/uL (ref 0.7–4.0)
MCHC: 33.3 g/dL (ref 30.0–36.0)
MCV: 92.1 fl (ref 78.0–100.0)
Monocytes Absolute: 0.3 10*3/uL (ref 0.1–1.0)
Monocytes Relative: 6.3 % (ref 3.0–12.0)
NEUTROS PCT: 55.2 % (ref 43.0–77.0)
Neutro Abs: 3 10*3/uL (ref 1.4–7.7)
Platelets: 181 10*3/uL (ref 150.0–400.0)
RBC: 4.75 Mil/uL (ref 4.22–5.81)
RDW: 13.9 % (ref 11.5–15.5)
WBC: 5.4 10*3/uL (ref 4.0–10.5)

## 2013-12-26 LAB — LIPID PANEL
CHOLESTEROL: 168 mg/dL (ref 0–200)
HDL: 29.1 mg/dL — ABNORMAL LOW (ref >39.00)
LDL Cholesterol: 106 mg/dL — ABNORMAL HIGH (ref 0–99)
NonHDL: 138.9
Total CHOL/HDL Ratio: 6
Triglycerides: 163 mg/dL — ABNORMAL HIGH (ref 0.0–149.0)
VLDL: 32.6 mg/dL (ref 0.0–40.0)

## 2013-12-26 LAB — HEPATIC FUNCTION PANEL
ALT: 25 U/L (ref 0–53)
AST: 22 U/L (ref 0–37)
Albumin: 3.4 g/dL — ABNORMAL LOW (ref 3.5–5.2)
Alkaline Phosphatase: 89 U/L (ref 39–117)
BILIRUBIN TOTAL: 0.7 mg/dL (ref 0.2–1.2)
Bilirubin, Direct: 0.1 mg/dL (ref 0.0–0.3)
Total Protein: 7.3 g/dL (ref 6.0–8.3)

## 2013-12-26 LAB — HEMOGLOBIN A1C: HEMOGLOBIN A1C: 7.7 % — AB (ref 4.6–6.5)

## 2013-12-26 LAB — TSH: TSH: 1.19 u[IU]/mL (ref 0.35–4.50)

## 2013-12-28 NOTE — Telephone Encounter (Signed)
Lab order was already placed

## 2014-01-01 ENCOUNTER — Ambulatory Visit: Payer: Medicare Other | Admitting: Family Medicine

## 2014-01-10 ENCOUNTER — Telehealth: Payer: Self-pay | Admitting: Family Medicine

## 2014-01-10 ENCOUNTER — Ambulatory Visit (INDEPENDENT_AMBULATORY_CARE_PROVIDER_SITE_OTHER): Payer: Medicare Other | Admitting: Family Medicine

## 2014-01-10 ENCOUNTER — Encounter: Payer: Self-pay | Admitting: Family Medicine

## 2014-01-10 VITALS — BP 153/63 | HR 95 | Temp 98.0°F | Ht 70.0 in | Wt 251.8 lb

## 2014-01-10 DIAGNOSIS — Z23 Encounter for immunization: Secondary | ICD-10-CM

## 2014-01-10 DIAGNOSIS — E669 Obesity, unspecified: Secondary | ICD-10-CM | POA: Diagnosis not present

## 2014-01-10 DIAGNOSIS — E119 Type 2 diabetes mellitus without complications: Secondary | ICD-10-CM

## 2014-01-10 DIAGNOSIS — I10 Essential (primary) hypertension: Secondary | ICD-10-CM

## 2014-01-10 DIAGNOSIS — E1169 Type 2 diabetes mellitus with other specified complication: Secondary | ICD-10-CM

## 2014-01-10 DIAGNOSIS — E785 Hyperlipidemia, unspecified: Secondary | ICD-10-CM

## 2014-01-10 MED ORDER — LOSARTAN POTASSIUM 50 MG PO TABS: 50.0000 mg | ORAL_TABLET | Freq: Two times a day (BID) | ORAL | Status: DC

## 2014-01-10 NOTE — Telephone Encounter (Signed)
Pt was unable to get an appt for his wellness visit until April, so he wanted dr. Charlett Blake to be aware that he also made a 3 month follow up appt in Feb as well, pt would like for dr. Etter Sjogren to call him if she is not ok with that. Pt thought she would want to check his diabetes in 3 months.

## 2014-01-10 NOTE — Progress Notes (Signed)
Patient ID: Michael Cunningham, male   DOB: Oct 11, 1945, 68 y.o.   MRN: [id number] Michael Cunningham [id number] [birth year] 01/10/2014      Progress Note-Follow Up  Subjective  Chief Complaint  Chief Complaint  Patient presents with  . Follow-up    3 month  . Injections    flu and prevnar    HPI  Patient is a 68 year old male in today for routine medical care. Feeling fairly well but notes sugars continue to run high, today his sugar was 190 this am prior to eating. Unfortunately he has had a low of 70, average is roughly 150. No other recent illness, agrees to [place] and flu shot today. No polyuria or polydipsia. Denies CP/palp/SOB/HA/congestion/fevers/GI or GU c/o. Taking meds as prescribed  Past Medical History  Diagnosis Date  . Hypertension   . Multiple lipomas     hx. mulitple and some remains -arms, legs  . Diverticulosis of colon     LEFT SIDE  . History of small bowel obstruction     X2  28 YRS AGO AND LAST ONE 06-02-2001--  RESOLVED WITHOUT SURGICAL INTERVENTION  . Type 2 diabetes mellitus   . History of kidney stones   . Left ureteral calculus   . Complication of anesthesia     11'12 Surgery with excrutiating "head about to explode" upon awakening  -lasted several hours postop  ( SURGERY DONE 08-13-2011 AT [facility] PT DID OK POST OP)  . At risk for sleep apnea     STOP-BANG =  4   SENT TO PCP 04-26-2013  . Chicken pox as a child  . Measles as a child  . Epistaxis   . Hyperlipidemia   . Recurrent kidney stones 07/08/2013    Follows with Dr Gaynelle Arabian He believes they are Calcium oxalate stones  . Obesity, unspecified 09/18/2013    Past Surgical History  Procedure Laterality Date  . Lipoma excision  1975    multiple, 10-12  . Knee arthroscopy  08/13/2011    right ARTHROSCOPY KNEE;  Surgeon: Gearlean Alf, MD;  Location: [facility];  Service: Orthopedics;  Laterality: Right;  medial  meniscal debridement, excision of plica, synovectomy  . Knee arthroscopy w/ meniscectomy Right  01-29-2011    twice, first trimmed mediscus and cyst  . Colonoscopy w/ polypectomy  LAST ONE 2013  . Knee arthroscopy Left 11/16/2013    Procedure: LEFT KNEE ARTHROSCOPY MEDIAL MENISCAL DEBRIDEMENT, CHONDROPLASTY;  Surgeon: Gearlean Alf, MD;  Location: [facility];  Service: Orthopedics;  Laterality: Left;    Family History  Problem Relation Age of Onset  . Hypertension Mother   . Cancer Mother 42    ovarian  . Heart disease Father     chf  . Heart attack Father 45  . Cancer Sister     breast- just finished her last radiation  . Heart disease Maternal Grandfather     ?  [name] Heart attack Paternal Grandfather   . Diabetes Neg Hx     History   Social History  . Marital Status: Married    Spouse Name: N/A    Number of Children: N/A  . Years of Education: N/A   Occupational History  . Not on file.   Social History Main Topics  . Smoking status: Former Smoker -- 1.00 packs/day for 4 years    Types: Cigarettes    Quit date: 06/14/1967  . Smokeless tobacco: Never Used  . Alcohol Use: No  . Drug Use:  No  . Sexual Activity: Yes     Comment: lives with wife   Other Topics Concern  . Not on file   Social History Narrative  . No narrative on file    Current Outpatient Prescriptions on File Prior to Visit  Medication Sig Dispense Refill  . aspirin EC 81 MG tablet Take 81 mg by mouth daily with breakfast.       . ibuprofen (ADVIL,MOTRIN) 200 MG tablet Take 400 mg by mouth every 6 (six) hours as needed for mild pain.      [name] insulin NPH Human (HUMULIN N,NOVOLIN N) 100 UNIT/ML injection Inject 25 Units into the skin at bedtime.       . insulin regular (NOVOLIN R,HUMULIN R) 100 units/mL injection Inject 30-45 Units into the skin 3 (three) times daily before meals. 3 times a day (just before each meal), 40-30-45 units.      [name] losartan (COZAAR) 50 MG tablet Take 1 tablet (50 mg total) by mouth daily with breakfast.  90 tablet  3  . Multiple Vitamin (MULTIVITAMIN WITH MINERALS) TABS  tablet Take 1 tablet by mouth daily.      . simvastatin (ZOCOR) 20 MG tablet Take 1 tablet (20 mg total) by mouth every evening.  90 tablet  3  . HYDROcodone-acetaminophen (NORCO) 5-325 MG per tablet Take 1-2 tablets by mouth every 6 (six) hours as needed for moderate pain.  40 tablet  0   No current facility-administered medications on file prior to visit.    Allergies  Allergen Reactions  . Ciprofloxacin Other (See Comments)    Abdominal pain  . Lipitor [Atorvastatin Calcium] Other (See Comments)    Severe muscle pain  . Lisinopril Cough  . Metformin And Related Diarrhea    Review of Systems  Review of Systems  Constitutional: Negative for fever, chills and malaise/fatigue.  HENT: Negative for congestion, hearing loss and nosebleeds.   Eyes: Negative for discharge.  Respiratory: Negative for cough, sputum production, shortness of breath and wheezing.   Cardiovascular: Negative for chest pain, palpitations and leg swelling.  Gastrointestinal: Negative for heartburn, nausea, vomiting, abdominal pain, diarrhea, constipation and blood in stool.  Genitourinary: Negative for dysuria, urgency, frequency and hematuria.  Musculoskeletal: Negative for back pain, falls and myalgias.  Skin: Negative for rash.  Neurological: Negative for dizziness, tremors, sensory change, focal weakness, loss of consciousness, weakness and headaches.  Endo/Heme/Allergies: Negative for polydipsia. Does not bruise/bleed easily.  Psychiatric/Behavioral: Negative for depression and suicidal ideas. The patient is not nervous/anxious and does not have insomnia.     Objective  BP 153/63  Pulse 95  Temp(Src) 98 F (36.7 C) (Oral)  Ht 5\' 10"  (1.778 m)  Wt 251 lb 12.8 oz (114.216 kg)  BMI 36.13 kg/m2  SpO2 95%  Physical Exam  Physical Exam  Constitutional: He is oriented to person, place, and time and well-developed, well-nourished, and in no distress. No distress.  HENT:  Head: Normocephalic and  atraumatic.  Eyes: Conjunctivae are normal.  Neck: Neck supple. No thyromegaly present.  Cardiovascular: Normal rate, regular rhythm and normal heart sounds.   No murmur heard. Pulmonary/Chest: Effort normal and breath sounds normal. No respiratory distress.  Abdominal: Soft. Bowel sounds are normal. He exhibits no distension and no mass. There is no tenderness.  Musculoskeletal: He exhibits no edema.  Neurological: He is alert and oriented to person, place, and time.  Skin: Skin is warm.  Psychiatric: Memory, affect and judgment normal.    Lab Results  Component Value Date   TSH 1.19 12/26/2013   Lab Results  Component Value Date   WBC 5.4 12/26/2013   HGB 14.6 12/26/2013   HCT 43.7 12/26/2013   MCV 92.1 12/26/2013   PLT 181.0 12/26/2013   Lab Results  Component Value Date   CREATININE 0.9 12/26/2013   BUN 17 12/26/2013   NA 136 12/26/2013   K 4.7 12/26/2013   CL 103 12/26/2013   CO2 27 12/26/2013   Lab Results  Component Value Date   ALT 25 12/26/2013   AST 22 12/26/2013   ALKPHOS 89 12/26/2013   BILITOT 0.7 12/26/2013   Lab Results  Component Value Date   CHOL 168 12/26/2013   Lab Results  Component Value Date   HDL 29.10* 12/26/2013   Lab Results  Component Value Date   LDLCALC 106* 12/26/2013   Lab Results  Component Value Date   TRIG 163.0* 12/26/2013   Lab Results  Component Value Date   CHOLHDL 6 12/26/2013     Assessment & Plan   Hypertension Seeing these same numbers at home as well. increase Losartan 50 mg to twice daily.  Hyperlipidemia Tolerating statin, encouraged heart healthy diet, avoid trans fats, minimize simple carbs and saturated fats. Increase exercise as tolerated  Obesity Encouraged DASH diet, decrease po intake and increase exercise as tolerated. Needs 7-8 hours of sleep nightly. Avoid trans fats, eat small, frequent meals every 4-5 hours with lean proteins, complex carbs and healthy fats. Minimize simple carbs, GMO  foods.  Diabetes mellitus type 2 in obese hgba1c unacceptable, minimize simple carbs. Increase exercise as tolerated. Continue current meds because patient has had some low numbers. Encouraged to eat a small amount every 4 hours with lean proteins and minimal complex carbs to prevent low sugars.

## 2014-01-10 NOTE — Telephone Encounter (Signed)
Pt wants dr Etter Sjogren to call him?

## 2014-01-10 NOTE — Progress Notes (Signed)
Pre visit review using our clinic review tool, if applicable. No additional management support is needed unless otherwise documented below in the visit note. 

## 2014-01-10 NOTE — Patient Instructions (Signed)

## 2014-01-20 NOTE — Assessment & Plan Note (Signed)
Tolerating statin, encouraged heart healthy diet, avoid trans fats, minimize simple carbs and saturated fats. Increase exercise as tolerated 

## 2014-01-20 NOTE — Assessment & Plan Note (Signed)
Seeing these same numbers at home as well. increase Losartan 50 mg to twice daily.

## 2014-01-20 NOTE — Assessment & Plan Note (Signed)
Encouraged DASH diet, decrease po intake and increase exercise as tolerated. Needs 7-8 hours of sleep nightly. Avoid trans fats, eat small, frequent meals every 4-5 hours with lean proteins, complex carbs and healthy fats. Minimize simple carbs, GMO foods. 

## 2014-01-20 NOTE — Assessment & Plan Note (Addendum)
hgba1c unacceptable, minimize simple carbs. Increase exercise as tolerated. Continue current meds because patient has had some low numbers. Encouraged to eat a small amount every 4 hours with lean proteins and minimal complex carbs to prevent low sugars.

## 2014-01-30 ENCOUNTER — Telehealth: Payer: Self-pay | Admitting: Family Medicine

## 2014-01-30 NOTE — Telephone Encounter (Signed)
Caller name: Cassady Turano Relation to pt: Call back number: [phone] Pharmacy:  Reason for call: Pt came in office 01-30-14 stating that rx for losartan (COZAAR) 50 MG tablet was consulted by Dr Charlett Blake that was needed to be taken 1 and 1/2 of rx a day and that pharmacy states it is twice a day. Pt requesting to make sure what is the correct dose. Please advise. Thank you.

## 2014-01-31 NOTE — Telephone Encounter (Signed)
So I wrote to increase to 50 mg ttwice  A day but can try 1/2 if bp goes too low

## 2014-02-01 ENCOUNTER — Encounter: Payer: Self-pay | Admitting: Family Medicine

## 2014-03-04 ENCOUNTER — Ambulatory Visit (INDEPENDENT_AMBULATORY_CARE_PROVIDER_SITE_OTHER): Payer: Medicare Other | Admitting: Medical

## 2014-03-04 ENCOUNTER — Encounter: Payer: Self-pay | Admitting: Medical

## 2014-03-04 VITALS — BP 144/84 | HR 90 | Temp 98.2°F | Ht 70.0 in | Wt 248.0 lb

## 2014-03-04 DIAGNOSIS — J0110 Acute frontal sinusitis, unspecified: Secondary | ICD-10-CM

## 2014-03-04 MED ORDER — AZITHROMYCIN 250 MG PO TABS: ORAL_TABLET | ORAL | Status: DC

## 2014-03-04 MED ORDER — FLUTICASONE PROPIONATE 50 MCG/ACT NA SUSP: 2.0000 | Freq: Every day | NASAL | Status: DC

## 2014-03-04 MED ORDER — BENZONATATE 100 MG PO CAPS: 100.0000 mg | ORAL_CAPSULE | Freq: Three times a day (TID) | ORAL | Status: DC | PRN

## 2014-03-04 NOTE — Progress Notes (Signed)
Pre visit review using our clinic review tool, if applicable. No additional management support is needed unless otherwise documented below in the visit note. 

## 2014-03-04 NOTE — Progress Notes (Signed)
Subjective:    Patient ID: Michael Cunningham, male    DOB: [birth year], 68 y.o.   MRN: [id number]  HPI   Pt in today reporting cough, nasal congestion and runny nose for  7 Days.  Pt states he has very whitish, green/yellow blood tinged mucous. Feels chest congested. Some sinus pressure as well. Blowing out a lot of mucous. Last night very rough night.  Associated symptoms( below yes or no)  Fever-no Chills-no Chest congestion-yes with sinus pressure Sneezing- yes Itching eyes-no Sore throat- mild throat pain. Post-nasal drainage-yes Wheezing-no Purulent nasal drainage-yes a lot if tries. Fatigue-mild.   Pt in with hoarse voice.(laryngitis)  Past Medical History  Diagnosis Date  . Hypertension   . Multiple lipomas     hx. mulitple and some remains -arms, legs  . Diverticulosis of colon     LEFT SIDE  . History of small bowel obstruction     X2  28 YRS AGO AND LAST ONE 06-02-2001--  RESOLVED WITHOUT SURGICAL INTERVENTION  . Type 2 diabetes mellitus   . History of kidney stones   . Left ureteral calculus   . Complication of anesthesia     11'12 Surgery with excrutiating "head about to explode" upon awakening  -lasted several hours postop  ( SURGERY DONE 08-13-2011 AT [facility] PT DID OK POST OP)  . At risk for sleep apnea     STOP-BANG =  4   SENT TO PCP 04-26-2013  . Chicken pox as a child  . Measles as a child  . Epistaxis   . Hyperlipidemia   . Recurrent kidney stones 07/08/2013    Follows with Dr Gaynelle Arabian He believes they are Calcium oxalate stones  . Obesity, unspecified 09/18/2013    History   Social History  . Marital Status: Married    Spouse Name: N/A    Number of Children: N/A  . Years of Education: N/A   Occupational History  . Not on file.   Social History Main Topics  . Smoking status: Former Smoker -- 1.00 packs/day for 4 years    Types: Cigarettes    Quit date: 06/14/1967  . Smokeless tobacco: Never Used  . Alcohol Use: No  . Drug Use: No  .  Sexual Activity: Yes     Comment: lives with wife   Other Topics Concern  . Not on file   Social History Narrative    Past Surgical History  Procedure Laterality Date  . Lipoma excision  1975    multiple, 10-12  . Knee arthroscopy  08/13/2011    right ARTHROSCOPY KNEE;  Surgeon: Gearlean Alf, MD;  Location: [facility];  Service: Orthopedics;  Laterality: Right;  medial  meniscal debridement, excision of plica, synovectomy  . Knee arthroscopy w/ meniscectomy Right 01-29-2011    twice, first trimmed mediscus and cyst  . Colonoscopy w/ polypectomy  LAST ONE 2013  . Knee arthroscopy Left 11/16/2013    Procedure: LEFT KNEE ARTHROSCOPY MEDIAL MENISCAL DEBRIDEMENT, CHONDROPLASTY;  Surgeon: Gearlean Alf, MD;  Location: [facility];  Service: Orthopedics;  Laterality: Left;    Family History  Problem Relation Age of Onset  . Hypertension Mother   . Cancer Mother 30    ovarian  . Heart disease Father     chf  . Heart attack Father 43  . Cancer Sister     breast- just finished her last radiation  . Heart disease Maternal Grandfather     ?  [name] Heart attack Paternal  Grandfather   . Diabetes Neg Hx     Allergies  Allergen Reactions  . Ciprofloxacin Other (See Comments)    Abdominal pain  . Lipitor [Atorvastatin Calcium] Other (See Comments)    Severe muscle pain  . Lisinopril Cough  . Metformin And Related Diarrhea    Current Outpatient Prescriptions on File Prior to Visit  Medication Sig Dispense Refill  . aspirin EC 81 MG tablet Take 81 mg by mouth daily with breakfast.     . HYDROcodone-acetaminophen (NORCO) 5-325 MG per tablet Take 1-2 tablets by mouth every 6 (six) hours as needed for moderate pain. 40 tablet 0  . ibuprofen (ADVIL,MOTRIN) 200 MG tablet Take 400 mg by mouth every 6 (six) hours as needed for mild pain.    [name] insulin NPH Human (HUMULIN N,NOVOLIN N) 100 UNIT/ML injection Inject 25 Units into the skin at bedtime.     . insulin regular (NOVOLIN R,HUMULIN R) 100  units/mL injection Inject 30-45 Units into the skin 3 (three) times daily before meals. 3 times a day (just before each meal), 40-30-45 units.    [name] losartan (COZAAR) 50 MG tablet Take 1 tablet (50 mg total) by mouth 2 (two) times daily. 60 tablet 3  . Multiple Vitamin (MULTIVITAMIN WITH MINERALS) TABS tablet Take 1 tablet by mouth daily.    . simvastatin (ZOCOR) 20 MG tablet Take 1 tablet (20 mg total) by mouth every evening. 90 tablet 3   No current facility-administered medications on file prior to visit.    BP 144/84 mmHg  Pulse 90  Temp(Src) 98.2 F (36.8 C) (Oral)  Ht 5\' 10"  (1.778 m)  Wt 248 lb (112.492 kg)  BMI 35.58 kg/m2  SpO2 95%      Review of Systems  Constitutional: Positive for fatigue. Negative for fever and chills.  HENT: Positive for congestion, postnasal drip, sinus pressure and sore throat.   Respiratory: Positive for cough. Negative for chest tightness and wheezing.        Chest congestion.  Cardiovascular: Negative for chest pain and palpitations.  Musculoskeletal: Negative for back pain and neck pain.  Neurological: Negative for dizziness and headaches.  Hematological: Negative for adenopathy. Does not bruise/bleed easily.       Objective:   Physical Exam   General  Mental Status - Alert. General Appearance - Well groomed. Not in acute distress. Speaks with hoarse voice  Skin Rashes- No Rashes.  HEENT Head- Normal. Ear Auditory Canal - Left- Normal. Right - Normal.Tympanic Membrane- Left- Normal. Right- Normal. Eye Sclera/Conjunctiva- Left- Normal. Right- Normal. Nose & Sinuses Nasal Mucosa- Left-  Boggy and Congested. Right-  Boggy and  Congested. No  maxillary but  frontal sinus pressure bilaterally Mouth & Throat Lips: Upper Lip- Normal: no dryness, cracking, pallor, cyanosis, or vesicular eruption. Lower Lip-Normal: no dryness, cracking, pallor, cyanosis or vesicular eruption. Buccal Mucosa- Bilateral- No Aphthous ulcers. Oropharynx- No  Discharge or Erythema. +PND Tonsils: Characteristics- Bilateral- No Erythema or Congestion. Size/Enlargement- Bilateral- No enlargement. Discharge- bilateral-None.  Neck Neck- Supple. No Masses.   Chest and Lung Exam Auscultation: Breath Sounds:-Clear even and unlabored.  Cardiovascular Auscultation:Rythm- Regular, rate and rhythm. Murmurs & Other Heart Sounds:Ausculatation of the heart reveal- No Murmurs.  Lymphatic Head & Neck General Head & Neck Lymphatics: Bilateral: Description- No Localized lymphadenopathy.         Assessment & Plan:

## 2014-03-04 NOTE — Patient Instructions (Addendum)
Your appear to have a sinus infection. I am prescribing azithromycin  antibiotic for the infection. To help with the nasal congestion I prescribed flonase nasal steroid. For your associated cough, I prescribed benzonatate  cough medicine.  You may have possible early bronchitis as well. If your chest congestion persists despite the above then would recommend cxr.   For you laryngitis, try to rest your voice.  Rest, hydrate, tylenol for fever.  Follow up in 7 days or as needed.

## 2014-03-04 NOTE — Assessment & Plan Note (Addendum)
Your appear to have a sinus infection. I am prescribing azithromycin  antibiotic for the infection. To help with the nasal congestion I prescribed flonase nasal steroid. For your associated cough, I prescribed benzonatate  cough medicine.

## 2014-03-18 ENCOUNTER — Other Ambulatory Visit: Payer: Self-pay | Admitting: *Deleted

## 2014-03-18 DIAGNOSIS — I10 Essential (primary) hypertension: Secondary | ICD-10-CM

## 2014-03-18 MED ORDER — LOSARTAN POTASSIUM 50 MG PO TABS: 50.0000 mg | ORAL_TABLET | Freq: Two times a day (BID) | ORAL | Status: DC

## 2014-03-18 NOTE — Telephone Encounter (Signed)
Rx sent to the pharmacy by e-script.//AB/CMA 

## 2014-03-21 ENCOUNTER — Encounter: Payer: Self-pay | Admitting: Endocrinology

## 2014-03-21 ENCOUNTER — Ambulatory Visit (INDEPENDENT_AMBULATORY_CARE_PROVIDER_SITE_OTHER): Payer: Medicare Other | Admitting: Endocrinology

## 2014-03-21 VITALS — BP 144/92 | HR 94 | Temp 97.9°F | Ht 70.0 in | Wt 247.0 lb

## 2014-03-21 DIAGNOSIS — E669 Obesity, unspecified: Secondary | ICD-10-CM

## 2014-03-21 DIAGNOSIS — E119 Type 2 diabetes mellitus without complications: Secondary | ICD-10-CM | POA: Diagnosis not present

## 2014-03-21 DIAGNOSIS — E1169 Type 2 diabetes mellitus with other specified complication: Secondary | ICD-10-CM

## 2014-03-21 LAB — MICROALBUMIN / CREATININE URINE RATIO
CREATININE, U: 134.4 mg/dL
MICROALB UR: 0.8 mg/dL (ref 0.0–1.9)
MICROALB/CREAT RATIO: 0.6 mg/g (ref 0.0–30.0)

## 2014-03-21 LAB — HEMOGLOBIN A1C: HEMOGLOBIN A1C: 7.9 % — AB (ref 4.6–6.5)

## 2014-03-21 MED ORDER — INSULIN NPH (HUMAN) (ISOPHANE) 100 UNIT/ML ~~LOC~~ SUSP: 35.0000 [IU] | Freq: Every day | SUBCUTANEOUS | Status: DC

## 2014-03-21 MED ORDER — INSULIN REGULAR HUMAN 100 UNIT/ML IJ SOLN: INTRAMUSCULAR | Status: DC

## 2014-03-21 NOTE — Patient Instructions (Addendum)
check your blood sugar twice a day.  vary the time of day when you check, between before the 3 meals, and at bedtime.  also check if you have symptoms of your blood sugar being too high or too low.  please keep a record of the readings and bring it to your next appointment here.  You can write it on any piece of paper.  please call us sooner if your blood sugar goes below 70, or if you have a lot of readings over 200.     Please come back for a follow-up appointment in 3 months.   blood tests are being requested for you today.  We'll let you know about the results. If it is high, we'll increase the NPH, and change it to humulin.

## 2014-03-21 NOTE — Progress Notes (Signed)
Subjective:    Patient ID: Michael Cunningham, male    DOB: 1946-03-10, 69 y.o.   MRN: [id number]  HPI  Pt returns for f/u of diabetes mellitus: DM type: Insulin-requiring type 2 Dx'ed: 8546 Complications: polyneuropathy Therapy: insulin since 2012 DKA: never Severe hypoglycemia: never. Pancreatitis: never Other: he cannot afford insulin analogs; he takes multiple daily injections Interval history: pt states he feels well in general.  no cbg record, but states cbg's vary from 105-180.  There is no trend throughout the day, although it is most consistently high in am.  Past Medical History  Diagnosis Date  . Hypertension   . Multiple lipomas     hx. mulitple and some remains -arms, legs  . Diverticulosis of colon     LEFT SIDE  . History of small bowel obstruction     X2  28 YRS AGO AND LAST ONE 06-02-2001--  RESOLVED WITHOUT SURGICAL INTERVENTION  . Type 2 diabetes mellitus   . History of kidney stones   . Left ureteral calculus   . Complication of anesthesia     11'12 Surgery with excrutiating "head about to explode" upon awakening  -lasted several hours postop  ( SURGERY DONE 08-13-2011 AT [facility] PT DID OK POST OP)  . At risk for sleep apnea     STOP-BANG =  4   SENT TO PCP 04-26-2013  . Chicken pox as a child  . Measles as a child  . Epistaxis   . Hyperlipidemia   . Recurrent kidney stones 07/08/2013    Follows with Dr Gaynelle Arabian He believes they are Calcium oxalate stones  . Obesity, unspecified 09/18/2013    Past Surgical History  Procedure Laterality Date  . Lipoma excision  1975    multiple, 10-12  . Knee arthroscopy  08/13/2011    right ARTHROSCOPY KNEE;  Surgeon: Gearlean Alf, MD;  Location: [facility];  Service: Orthopedics;  Laterality: Right;  medial  meniscal debridement, excision of plica, synovectomy  . Knee arthroscopy w/ meniscectomy Right 01-29-2011    twice, first trimmed mediscus and cyst  . Colonoscopy w/ polypectomy  LAST ONE 2013  . Knee arthroscopy Left  11/16/2013    Procedure: LEFT KNEE ARTHROSCOPY MEDIAL MENISCAL DEBRIDEMENT, CHONDROPLASTY;  Surgeon: Gearlean Alf, MD;  Location: [facility];  Service: Orthopedics;  Laterality: Left;    History   Social History  . Marital Status: Married    Spouse Name: N/A    Number of Children: N/A  . Years of Education: N/A   Occupational History  . Not on file.   Social History Main Topics  . Smoking status: Former Smoker -- 1.00 packs/day for 4 years    Types: Cigarettes    Quit date: 06/14/1967  . Smokeless tobacco: Never Used  . Alcohol Use: No  . Drug Use: No  . Sexual Activity: Yes     Comment: lives with wife   Other Topics Concern  . Not on file   Social History Narrative    Current Outpatient Prescriptions on File Prior to Visit  Medication Sig Dispense Refill  . aspirin EC 81 MG tablet Take 81 mg by mouth daily with breakfast.     . diclofenac sodium (VOLTAREN) 1 % GEL Apply topically 4 (four) times daily.    . fluticasone (FLONASE) 50 MCG/ACT nasal spray Place 2 sprays into both nostrils daily. 16 g 1  . HYDROcodone-acetaminophen (NORCO) 5-325 MG per tablet Take 1-2 tablets by mouth every 6 (six) hours  as needed for moderate pain. 40 tablet 0  . ibuprofen (ADVIL,MOTRIN) 200 MG tablet Take 400 mg by mouth every 6 (six) hours as needed for mild pain.    [name] losartan (COZAAR) 50 MG tablet Take 1 tablet (50 mg total) by mouth 2 (two) times daily. (Patient taking differently: Take 50 mg by mouth 2 (two) times daily. Pt is taking 1 1/2 pill daily.) 180 tablet 0  . Multiple Vitamin (MULTIVITAMIN WITH MINERALS) TABS tablet Take 1 tablet by mouth daily.    . simvastatin (ZOCOR) 20 MG tablet Take 1 tablet (20 mg total) by mouth every evening. 90 tablet 3   No current facility-administered medications on file prior to visit.    Allergies  Allergen Reactions  . Ciprofloxacin Other (See Comments)    Abdominal pain  . Lipitor [Atorvastatin Calcium] Other (See Comments)    Severe muscle  pain  . Lisinopril Cough  . Metformin And Related Diarrhea    Family History  Problem Relation Age of Onset  . Hypertension Mother   . Cancer Mother 31    ovarian  . Heart disease Father     chf  . Heart attack Father 86  . Cancer Sister     breast- just finished her last radiation  . Heart disease Maternal Grandfather     ?  [name] Heart attack Paternal Grandfather   . Diabetes Neg Hx     BP 144/92 mmHg  Pulse 94  Temp(Src) 97.9 F (36.6 C) (Oral)  Ht 5\' 10"  (1.778 m)  Wt 247 lb (112.038 kg)  BMI 35.44 kg/m2  SpO2 96%    Review of Systems He denies hypoglycemia and weight change    Objective:   Physical Exam VITAL SIGNS:  See vs page GENERAL: no distress Pulses: dorsalis pedis intact bilat.   MSK: no deformity of the feet CV: trace bilat leg edema Skin:  no ulcer on the feet.  normal color and temp on the feet. Neuro: sensation is intact to touch on the feet, but decreased from normal.    Lab Results  Component Value Date   HGBA1C 7.9* 03/21/2014       Assessment & Plan:  DM: mild exacerbation   Patient is advised the following: Patient Instructions  check your blood sugar twice a day.  vary the time of day when you check, between before the 3 meals, and at bedtime.  also check if you have symptoms of your blood sugar being too high or too low.  please keep a record of the readings and bring it to your next appointment here.  You can write it on any piece of paper.  please call us sooner if your blood sugar goes below 70, or if you have a lot of readings over 200.     Please come back for a follow-up appointment in 3 months.   blood tests are being requested for you today.  We'll let you know about the results. If it is high, we'll increase the NPH, and change it to humulin.     increase the NPH

## 2014-03-31 ENCOUNTER — Other Ambulatory Visit: Payer: Self-pay | Admitting: Endocrinology

## 2014-04-02 ENCOUNTER — Other Ambulatory Visit: Payer: Self-pay

## 2014-04-02 MED ORDER — "INSULIN SYRINGE-NEEDLE U-100 31G X 5/16"" 0.5 ML MISC": Status: DC

## 2014-04-16 ENCOUNTER — Ambulatory Visit (INDEPENDENT_AMBULATORY_CARE_PROVIDER_SITE_OTHER): Payer: Medicare Other | Admitting: Family Medicine

## 2014-04-16 ENCOUNTER — Encounter: Payer: Self-pay | Admitting: Family Medicine

## 2014-04-16 ENCOUNTER — Ambulatory Visit: Payer: Medicare Other | Admitting: Family Medicine

## 2014-04-16 ENCOUNTER — Telehealth: Payer: Self-pay | Admitting: Family Medicine

## 2014-04-16 VITALS — BP 150/95 | HR 83 | Temp 97.6°F | Ht 70.0 in | Wt 250.6 lb

## 2014-04-16 DIAGNOSIS — E1169 Type 2 diabetes mellitus with other specified complication: Secondary | ICD-10-CM

## 2014-04-16 DIAGNOSIS — E669 Obesity, unspecified: Secondary | ICD-10-CM | POA: Diagnosis not present

## 2014-04-16 DIAGNOSIS — M25562 Pain in left knee: Secondary | ICD-10-CM | POA: Diagnosis not present

## 2014-04-16 DIAGNOSIS — L578 Other skin changes due to chronic exposure to nonionizing radiation: Secondary | ICD-10-CM | POA: Diagnosis not present

## 2014-04-16 DIAGNOSIS — E782 Mixed hyperlipidemia: Secondary | ICD-10-CM | POA: Diagnosis not present

## 2014-04-16 DIAGNOSIS — M79643 Pain in unspecified hand: Secondary | ICD-10-CM

## 2014-04-16 DIAGNOSIS — E119 Type 2 diabetes mellitus without complications: Secondary | ICD-10-CM | POA: Diagnosis not present

## 2014-04-16 DIAGNOSIS — R079 Chest pain, unspecified: Secondary | ICD-10-CM | POA: Diagnosis not present

## 2014-04-16 DIAGNOSIS — E785 Hyperlipidemia, unspecified: Secondary | ICD-10-CM | POA: Diagnosis not present

## 2014-04-16 DIAGNOSIS — M1990 Unspecified osteoarthritis, unspecified site: Secondary | ICD-10-CM | POA: Diagnosis not present

## 2014-04-16 DIAGNOSIS — I10 Essential (primary) hypertension: Secondary | ICD-10-CM

## 2014-04-16 DIAGNOSIS — Z87898 Personal history of other specified conditions: Secondary | ICD-10-CM

## 2014-04-16 LAB — LIPID PANEL
CHOL/HDL RATIO: 5
Cholesterol: 171 mg/dL (ref 0–200)
HDL: 35.7 mg/dL — ABNORMAL LOW (ref >39.00)
NonHDL: 135.3
TRIGLYCERIDES: 202 mg/dL — AB (ref 0.0–149.0)
VLDL: 40.4 mg/dL — ABNORMAL HIGH (ref 0.0–40.0)

## 2014-04-16 LAB — TSH: TSH: 1.19 u[IU]/mL (ref 0.35–4.50)

## 2014-04-16 LAB — COMPREHENSIVE METABOLIC PANEL
ALBUMIN: 4.1 g/dL (ref 3.5–5.2)
ALK PHOS: 79 U/L (ref 39–117)
ALT: 30 U/L (ref 0–53)
AST: 26 U/L (ref 0–37)
BUN: 16 mg/dL (ref 6–23)
CHLORIDE: 104 meq/L (ref 96–112)
CO2: 27 mEq/L (ref 19–32)
CREATININE: 0.76 mg/dL (ref 0.40–1.50)
Calcium: 9.3 mg/dL (ref 8.4–10.5)
GFR: 107.83 mL/min (ref >60.00)
GLUCOSE: 167 mg/dL — AB (ref 70–99)
Potassium: 4.3 mEq/L (ref 3.5–5.1)
SODIUM: 136 meq/L (ref 135–145)
Total Bilirubin: 0.6 mg/dL (ref 0.2–1.2)
Total Protein: 6.8 g/dL (ref 6.0–8.3)

## 2014-04-16 LAB — RHEUMATOID FACTOR: Rhuematoid fact SerPl-aCnc: <10 IU/mL (ref <=14)

## 2014-04-16 LAB — CBC
HCT: 43.4 % (ref 39.0–52.0)
HEMOGLOBIN: 14.9 g/dL (ref 13.0–17.0)
MCHC: 34.2 g/dL (ref 30.0–36.0)
MCV: 90.1 fl (ref 78.0–100.0)
PLATELETS: 189 10*3/uL (ref 150.0–400.0)
RBC: 4.82 Mil/uL (ref 4.22–5.81)
RDW: 13.4 % (ref 11.5–15.5)
WBC: 5.6 10*3/uL (ref 4.0–10.5)

## 2014-04-16 LAB — SEDIMENTATION RATE: Sed Rate: 17 mm/hr (ref 0–22)

## 2014-04-16 LAB — LDL CHOLESTEROL, DIRECT: LDL DIRECT: 108 mg/dL

## 2014-04-16 NOTE — Telephone Encounter (Signed)
Caller name: Eldon Relation to pt: self Call back number: [phone] Pharmacy:  Reason for call:   Patient states that the cardiologist office called him to schedule an appointment for him. First available that they could give him was in march. Patient states that someone from the cardiologist office told him to call our office and we will have to call their office and let them know that this is urgent. Patient does not want to wait until March.

## 2014-04-16 NOTE — Progress Notes (Signed)
Pre visit review using our clinic review tool, if applicable. No additional management support is needed unless otherwise documented below in the visit note. 

## 2014-04-16 NOTE — Patient Instructions (Signed)
Apply Salon pas gel to feet and hands as needed Start Curcumen cap daily  Consider podiatry for feet, for now pick up over the rcounter Lamisil cream and apply daily, soak feet in warm water and vinegar daily  Arthritis, Nonspecific Arthritis is inflammation of a joint. This usually means pain, redness, warmth or swelling are present. One or more joints may be involved. There are a number of types of arthritis. Your caregiver may not be able to tell what type of arthritis you have right away. CAUSES  The most common cause of arthritis is the wear and tear on the joint (osteoarthritis). This causes damage to the cartilage, which can break down over time. The knees, hips, back and neck are most often affected by this type of arthritis. Other types of arthritis and common causes of joint pain include:  Sprains and other injuries near the joint. Sometimes minor sprains and injuries cause pain and swelling that develop hours later.  Rheumatoid arthritis. This affects hands, feet and knees. It usually affects both sides of your body at the same time. It is often associated with chronic ailments, fever, weight loss and general weakness.  Crystal arthritis. Gout and pseudo gout can cause occasional acute severe pain, redness and swelling in the foot, ankle, or knee.  Infectious arthritis. Bacteria can get into a joint through a break in overlying skin. This can cause infection of the joint. Bacteria and viruses can also spread through the blood and affect your joints.  Drug, infectious and allergy reactions. Sometimes joints can become mildly painful and slightly swollen with these types of illnesses. SYMPTOMS   Pain is the main symptom.  Your joint or joints can also be red, swollen and warm or hot to the touch.  You may have a fever with certain types of arthritis, or even feel overall ill.  The joint with arthritis will hurt with movement. Stiffness is present with some types of  arthritis. DIAGNOSIS  Your caregiver will suspect arthritis based on your description of your symptoms and on your exam. Testing may be needed to find the type of arthritis:  Blood and sometimes urine tests.  X-ray tests and sometimes CT or MRI scans.  Removal of fluid from the joint (arthrocentesis) is done to check for bacteria, crystals or other causes. Your caregiver (or a specialist) will numb the area over the joint with a local anesthetic, and use a needle to remove joint fluid for examination. This procedure is only minimally uncomfortable.  Even with these tests, your caregiver may not be able to tell what kind of arthritis you have. Consultation with a specialist (rheumatologist) may be helpful. TREATMENT  Your caregiver will discuss with you treatment specific to your type of arthritis. If the specific type cannot be determined, then the following general recommendations may apply. Treatment of severe joint pain includes:  Rest.  Elevation.  Anti-inflammatory medication (for example, ibuprofen) may be prescribed. Avoiding activities that cause increased pain.  Only take over-the-counter or prescription medicines for pain and discomfort as recommended by your caregiver.  Cold packs over an inflamed joint may be used for 10 to 15 minutes every hour. Hot packs sometimes feel better, but do not use overnight. Do not use hot packs if you are diabetic without your caregiver's permission.  A cortisone shot into arthritic joints may help reduce pain and swelling.  Any acute arthritis that gets worse over the next 1 to 2 days needs to be looked at to be sure  there is no joint infection. Long-term arthritis treatment involves modifying activities and lifestyle to reduce joint stress jarring. This can include weight loss. Also, exercise is needed to nourish the joint cartilage and remove waste. This helps keep the muscles around the joint strong. HOME CARE INSTRUCTIONS   Do not take  aspirin to relieve pain if gout is suspected. This elevates uric acid levels.  Only take over-the-counter or prescription medicines for pain, discomfort or fever as directed by your caregiver.  Rest the joint as much as possible.  If your joint is swollen, keep it elevated.  Use crutches if the painful joint is in your leg.  Drinking plenty of fluids may help for certain types of arthritis.  Follow your caregiver's dietary instructions.  Try low-impact exercise such as:  Swimming.  Water aerobics.  Biking.  Walking.  Morning stiffness is often relieved by a warm shower.  Put your joints through regular range-of-motion. SEEK MEDICAL CARE IF:   You do not feel better in 24 hours or are getting worse.  You have side effects to medications, or are not getting better with treatment. SEEK IMMEDIATE MEDICAL CARE IF:   You have a fever.  You develop severe joint pain, swelling or redness.  Many joints are involved and become painful and swollen.  There is severe back pain and/or leg weakness.  You have loss of bowel or bladder control. Document Released: 04/08/2004 Document Revised: 05/24/2011 Document Reviewed: 04/24/2008 [facility] Patient Information 2015 [place], [place]. This information is not intended to replace advice given to you by your health care provider. Make sure you discuss any questions you have with your health care provider.

## 2014-04-17 LAB — ANA: Anti Nuclear Antibody(ANA): NEGATIVE

## 2014-04-17 NOTE — Telephone Encounter (Signed)
Patient requested sooner appointment with Cardiology.Patients appointment resheduled with Dr. Percival Spanish at [facility] on February 19th at 12:00. Patient notified.

## 2014-04-19 ENCOUNTER — Inpatient Hospital Stay (HOSPITAL_COMMUNITY)
Admission: EM | Admit: 2014-04-19 | Discharge: 2014-04-22 | DRG: 390 | Disposition: A | Discharge status: Home / Self Care | Payer: Medicare Other | Attending: Internal Medicine | Admitting: Internal Medicine

## 2014-04-19 ENCOUNTER — Inpatient Hospital Stay (HOSPITAL_COMMUNITY): Payer: Medicare Other

## 2014-04-19 ENCOUNTER — Emergency Department (HOSPITAL_COMMUNITY): Payer: Medicare Other

## 2014-04-19 ENCOUNTER — Encounter (HOSPITAL_COMMUNITY): Payer: Self-pay | Admitting: *Deleted

## 2014-04-19 DIAGNOSIS — Z87891 Personal history of nicotine dependence: Secondary | ICD-10-CM | POA: Diagnosis not present

## 2014-04-19 DIAGNOSIS — Z6833 Body mass index (BMI) 33.0-33.9, adult: Secondary | ICD-10-CM

## 2014-04-19 DIAGNOSIS — Z87898 Personal history of other specified conditions: Secondary | ICD-10-CM

## 2014-04-19 DIAGNOSIS — Z7982 Long term (current) use of aspirin: Secondary | ICD-10-CM

## 2014-04-19 DIAGNOSIS — E669 Obesity, unspecified: Secondary | ICD-10-CM | POA: Diagnosis not present

## 2014-04-19 DIAGNOSIS — K760 Fatty (change of) liver, not elsewhere classified: Secondary | ICD-10-CM | POA: Diagnosis present

## 2014-04-19 DIAGNOSIS — R109 Unspecified abdominal pain: Secondary | ICD-10-CM

## 2014-04-19 DIAGNOSIS — Z87442 Personal history of urinary calculi: Secondary | ICD-10-CM

## 2014-04-19 DIAGNOSIS — K5730 Diverticulosis of large intestine without perforation or abscess without bleeding: Secondary | ICD-10-CM | POA: Diagnosis present

## 2014-04-19 DIAGNOSIS — I10 Essential (primary) hypertension: Secondary | ICD-10-CM | POA: Diagnosis not present

## 2014-04-19 DIAGNOSIS — R112 Nausea with vomiting, unspecified: Secondary | ICD-10-CM | POA: Diagnosis not present

## 2014-04-19 DIAGNOSIS — R072 Precordial pain: Secondary | ICD-10-CM | POA: Diagnosis not present

## 2014-04-19 DIAGNOSIS — K56600 Partial intestinal obstruction, unspecified as to cause: Secondary | ICD-10-CM

## 2014-04-19 DIAGNOSIS — E785 Hyperlipidemia, unspecified: Secondary | ICD-10-CM | POA: Diagnosis not present

## 2014-04-19 DIAGNOSIS — K5669 Other intestinal obstruction: Secondary | ICD-10-CM | POA: Diagnosis not present

## 2014-04-19 DIAGNOSIS — E119 Type 2 diabetes mellitus without complications: Secondary | ICD-10-CM | POA: Diagnosis not present

## 2014-04-19 DIAGNOSIS — Z794 Long term (current) use of insulin: Secondary | ICD-10-CM

## 2014-04-19 DIAGNOSIS — K598 Other specified functional intestinal disorders: Secondary | ICD-10-CM | POA: Diagnosis not present

## 2014-04-19 DIAGNOSIS — Z8719 Personal history of other diseases of the digestive system: Secondary | ICD-10-CM | POA: Diagnosis present

## 2014-04-19 DIAGNOSIS — K56609 Unspecified intestinal obstruction, unspecified as to partial versus complete obstruction: Secondary | ICD-10-CM | POA: Insufficient documentation

## 2014-04-19 DIAGNOSIS — R1084 Generalized abdominal pain: Secondary | ICD-10-CM | POA: Diagnosis not present

## 2014-04-19 DIAGNOSIS — Z4682 Encounter for fitting and adjustment of non-vascular catheter: Secondary | ICD-10-CM | POA: Diagnosis not present

## 2014-04-19 LAB — COMPREHENSIVE METABOLIC PANEL
ALT: 31 U/L (ref 0–53)
ANION GAP: 9 (ref 5–15)
AST: 27 U/L (ref 0–37)
Albumin: 3.8 g/dL (ref 3.5–5.2)
Alkaline Phosphatase: 73 U/L (ref 39–117)
BUN: 18 mg/dL (ref 6–23)
CALCIUM: 8.8 mg/dL (ref 8.4–10.5)
CO2: 29 mmol/L (ref 19–32)
Chloride: 100 mmol/L (ref 96–112)
Creatinine, Ser: 0.99 mg/dL (ref 0.50–1.35)
GFR calc Af Amer: >90 mL/min (ref >90)
GFR, EST NON AFRICAN AMERICAN: 82 mL/min — AB (ref >90)
GLUCOSE: 160 mg/dL — AB (ref 70–99)
Potassium: 4 mmol/L (ref 3.5–5.1)
Sodium: 138 mmol/L (ref 135–145)
Total Bilirubin: 1 mg/dL (ref 0.3–1.2)
Total Protein: 6.9 g/dL (ref 6.0–8.3)

## 2014-04-19 LAB — URINALYSIS, ROUTINE W REFLEX MICROSCOPIC
Bilirubin Urine: NEGATIVE
GLUCOSE, UA: NEGATIVE mg/dL
HGB URINE DIPSTICK: NEGATIVE
Ketones, ur: NEGATIVE mg/dL
Leukocytes, UA: NEGATIVE
Nitrite: NEGATIVE
PH: 5 (ref 5.0–8.0)
Protein, ur: NEGATIVE mg/dL
Specific Gravity, Urine: 1.015 (ref 1.005–1.030)
Urobilinogen, UA: 0.2 mg/dL (ref 0.0–1.0)

## 2014-04-19 LAB — LIPASE, BLOOD: LIPASE: 19 U/L (ref 11–59)

## 2014-04-19 LAB — CBC WITH DIFFERENTIAL/PLATELET
BASOS ABS: 0 10*3/uL (ref 0.0–0.1)
BASOS PCT: 0 % (ref 0–1)
EOS PCT: 0 % (ref 0–5)
Eosinophils Absolute: 0 10*3/uL (ref 0.0–0.7)
HCT: 44 % (ref 39.0–52.0)
HEMOGLOBIN: 15.3 g/dL (ref 13.0–17.0)
LYMPHS PCT: 10 % — AB (ref 12–46)
Lymphs Abs: 0.8 10*3/uL (ref 0.7–4.0)
MCH: 31 pg (ref 26.0–34.0)
MCHC: 34.8 g/dL (ref 30.0–36.0)
MCV: 89.2 fL (ref 78.0–100.0)
MONOS PCT: 8 % (ref 3–12)
Monocytes Absolute: 0.6 10*3/uL (ref 0.1–1.0)
NEUTROS ABS: 6 10*3/uL (ref 1.7–7.7)
Neutrophils Relative %: 82 % — ABNORMAL HIGH (ref 43–77)
PLATELETS: 164 10*3/uL (ref 150–400)
RBC: 4.93 MIL/uL (ref 4.22–5.81)
RDW: 13.4 % (ref 11.5–15.5)
WBC: 7.3 10*3/uL (ref 4.0–10.5)

## 2014-04-19 LAB — GLUCOSE, CAPILLARY
GLUCOSE-CAPILLARY: 145 mg/dL — AB (ref 70–99)
GLUCOSE-CAPILLARY: 158 mg/dL — AB (ref 70–99)
Glucose-Capillary: 175 mg/dL — ABNORMAL HIGH (ref 70–99)

## 2014-04-19 LAB — I-STAT TROPONIN, ED: TROPONIN I, POC: 0.01 ng/mL (ref 0.00–0.08)

## 2014-04-19 MED ORDER — HEPARIN SODIUM (PORCINE) 5000 UNIT/ML IJ SOLN
5000.0000 [IU] | Freq: Three times a day (TID) | INTRAMUSCULAR | Status: DC
  Administered 2014-04-19 – 2014-04-20 (×3): 5000 [IU] via SUBCUTANEOUS
  Filled 2014-04-19 (×5): qty 1

## 2014-04-19 MED ORDER — SODIUM CHLORIDE 0.9 % IV SOLN
INTRAVENOUS | Status: DC
  Administered 2014-04-19 – 2014-04-20 (×3): via INTRAVENOUS

## 2014-04-19 MED ORDER — ONDANSETRON HCL 4 MG/2ML IJ SOLN
4.0000 mg | Freq: Once | INTRAMUSCULAR | Status: AC
  Administered 2014-04-19: 4 mg via INTRAVENOUS
  Filled 2014-04-19: qty 2

## 2014-04-19 MED ORDER — IOHEXOL 300 MG/ML  SOLN
25.0000 mL | Freq: Once | INTRAMUSCULAR | Status: AC | PRN
  Administered 2014-04-19: 25 mL via ORAL

## 2014-04-19 MED ORDER — IOHEXOL 300 MG/ML  SOLN
100.0000 mL | Freq: Once | INTRAMUSCULAR | Status: AC | PRN
  Administered 2014-04-19: 100 mL via INTRAVENOUS

## 2014-04-19 MED ORDER — ACETAMINOPHEN 325 MG PO TABS: 650.0000 mg | ORAL_TABLET | Freq: Four times a day (QID) | ORAL | Status: DC | PRN

## 2014-04-19 MED ORDER — HYDROMORPHONE HCL 1 MG/ML IJ SOLN
1.0000 mg | Freq: Once | INTRAMUSCULAR | Status: AC
  Administered 2014-04-19: 1 mg via INTRAVENOUS
  Filled 2014-04-19: qty 1

## 2014-04-19 MED ORDER — SODIUM CHLORIDE 0.9 % IV BOLUS (SEPSIS)
500.0000 mL | Freq: Once | INTRAVENOUS | Status: AC
  Administered 2014-04-19: 500 mL via INTRAVENOUS

## 2014-04-19 MED ORDER — INSULIN ASPART 100 UNIT/ML ~~LOC~~ SOLN
0.0000 [IU] | SUBCUTANEOUS | Status: DC
  Administered 2014-04-19 – 2014-04-20 (×5): 3 [IU] via SUBCUTANEOUS
  Administered 2014-04-20: 5 [IU] via SUBCUTANEOUS
  Administered 2014-04-20 – 2014-04-21 (×3): 2 [IU] via SUBCUTANEOUS
  Administered 2014-04-21: 5 [IU] via SUBCUTANEOUS
  Administered 2014-04-21: 3 [IU] via SUBCUTANEOUS
  Administered 2014-04-21 (×2): 2 [IU] via SUBCUTANEOUS
  Administered 2014-04-21: 3 [IU] via SUBCUTANEOUS
  Administered 2014-04-21: 2 [IU] via SUBCUTANEOUS
  Administered 2014-04-22 (×2): 3 [IU] via SUBCUTANEOUS
  Administered 2014-04-22: 2 [IU] via SUBCUTANEOUS

## 2014-04-19 MED ORDER — HYDROMORPHONE HCL 1 MG/ML IJ SOLN
1.0000 mg | INTRAMUSCULAR | Status: DC | PRN
  Administered 2014-04-19 – 2014-04-20 (×2): 1 mg via INTRAVENOUS
  Filled 2014-04-19 (×2): qty 1

## 2014-04-19 MED ORDER — DICLOFENAC SODIUM 1 % TD GEL
2.0000 g | Freq: Three times a day (TID) | TRANSDERMAL | Status: DC | PRN
  Filled 2014-04-19: qty 100

## 2014-04-19 MED ORDER — MORPHINE SULFATE 2 MG/ML IJ SOLN
2.0000 mg | INTRAMUSCULAR | Status: DC | PRN
  Administered 2014-04-19: 2 mg via INTRAVENOUS
  Filled 2014-04-19: qty 1

## 2014-04-19 MED ORDER — ACETAMINOPHEN 650 MG RE SUPP: 650.0000 mg | Freq: Four times a day (QID) | RECTAL | Status: DC | PRN

## 2014-04-19 MED ORDER — INSULIN NPH (HUMAN) (ISOPHANE) 100 UNIT/ML ~~LOC~~ SUSP
15.0000 [IU] | Freq: Every day | SUBCUTANEOUS | Status: DC
Start: 2014-04-20 — End: 2014-04-22
  Administered 2014-04-20 – 2014-04-22 (×3): 15 [IU] via SUBCUTANEOUS
  Filled 2014-04-19: qty 10

## 2014-04-19 MED ORDER — METOPROLOL TARTRATE 1 MG/ML IV SOLN
5.0000 mg | INTRAVENOUS | Status: DC | PRN
  Filled 2014-04-19: qty 5

## 2014-04-19 MED ORDER — DIATRIZOATE MEGLUMINE & SODIUM 66-10 % PO SOLN
90.0000 mL | Freq: Once | ORAL | Status: AC
  Administered 2014-04-19: 90 mL via ORAL

## 2014-04-19 NOTE — ED Notes (Signed)
Pt watching tv with visitor at bedside

## 2014-04-19 NOTE — ED Notes (Signed)
CT aware pt finished contrast 

## 2014-04-19 NOTE — Progress Notes (Signed)
Pt admitted to the unit at 1245. Pt mental status is alert and oriented. Pt oriented to room, staff, and call bell. Skin is intact. Full assessment charted in [facility]. Call bell within reach. Visitor guidelines reviewed w/ pt and/or family.

## 2014-04-19 NOTE — ED Notes (Signed)
Attempted report x1. 

## 2014-04-19 NOTE — ED Notes (Signed)
Surgeon at bedside.  

## 2014-04-19 NOTE — ED Notes (Signed)
Pt c/o of Upper and lower Quadrant pain that's been constant. 10/10.

## 2014-04-19 NOTE — H&P (Signed)
[facility] History and Physical                                                                                    Michael Cunningham, is a 69 y.o. male  MRN: [id number]   DOB - 02/26/1946  Admit Date - 04/19/2014  Outpatient Primary MD for the patient is Penni Homans, MD  With History of -  Past Medical History  Diagnosis Date  . Hypertension   . Multiple lipomas     hx. mulitple and some remains -arms, legs  . Diverticulosis of colon     LEFT SIDE  . History of small bowel obstruction     X2  28 YRS AGO AND LAST ONE 06-02-2001--  RESOLVED WITHOUT SURGICAL INTERVENTION  . Type 2 diabetes mellitus   . History of kidney stones   . Left ureteral calculus   . Complication of anesthesia     11'12 Surgery with excrutiating "head about to explode" upon awakening  -lasted several hours postop  ( SURGERY DONE 08-13-2011 AT [facility] PT DID OK POST OP)  . At risk for sleep apnea     STOP-BANG =  4   SENT TO PCP 04-26-2013  . Chicken pox as a child  . Measles as a child  . Epistaxis   . Hyperlipidemia   . Recurrent kidney stones 07/08/2013    Follows with Dr Gaynelle Arabian He believes they are Calcium oxalate stones  . Obesity, unspecified 09/18/2013      Past Surgical History  Procedure Laterality Date  . Lipoma excision  1975    multiple, 10-12  . Knee arthroscopy  08/13/2011    right ARTHROSCOPY KNEE;  Surgeon: Gearlean Alf, MD;  Location: [facility];  Service: Orthopedics;  Laterality: Right;  medial  meniscal debridement, excision of plica, synovectomy  . Knee arthroscopy w/ meniscectomy Right 01-29-2011    twice, first trimmed mediscus and cyst  . Colonoscopy w/ polypectomy  LAST ONE 2013  . Knee arthroscopy Left 11/16/2013    Procedure: LEFT KNEE ARTHROSCOPY MEDIAL MENISCAL DEBRIDEMENT, CHONDROPLASTY;  Surgeon: Gearlean Alf, MD;  Location: [facility];  Service: Orthopedics;  Laterality: Left;    in for   Chief Complaint  Patient presents with  . Abdominal Pain     HPI Michael Cunningham  is a 69 y.o. male, with past medical history of hypertension and diabetes and dyslipidemia. Also history of small bowel obstructions the initial episode 30 years prior and the most recent 4 years prior. He presented to the ER with complaints of abdominal discomfort. He reported about 36 hours previously had multiple episodes of emesis that eventually resolved and yesterday evening developed generalized abdominal pain. His last bowel movement was on the a.m. of 04/18/14. Upon presentation to the ER CT the abdomen and pelvis was done and revealed a partial small bowel obstruction noted with dilated bowel loops towards the right mid abdomen but no definitive transition point seen. General surgery has artery seen the patient in consultation and an NG tube has been placed. Since placement of gastric tube patient reports a small amount of flatus.  Prior to this admission  the patient has been having issues with swelling and discomfort in both hands primarily at the PIP joints. He also had noted some exertional chest pain recently while shoveling snow. Both of these issues were discussed with the patient's primary care physician and he has been scheduled for an outpatient cardiology evaluation. The ER physician has ordered an EKG which has not been completed at time of admission.  Review of Systems   In addition to the HPI above: No Fever-chills, No Headache, No changes with Vision or hearing, No problems swallowing food or Liquids, Emesis with abdominal pain and distention for the past 24 hours Exertional Chest pain recently while shoveling snow, no Cough or Shortness of Breath, No Blood in stool or Urine, No dysuria, No new skin rashes or bruises, New pain with swelling without redness bilateral PIP joints hands No new weakness, tingling, numbness in any extremity, No recent weight gain or loss, No polyuria, polydypsia or polyphagia, A full 10 point Review of Systems was done, except as stated  above, all other Review of Systems were negative.  Social History History  Substance Use Topics  . Smoking status: Former Smoker -- 1.00 packs/day for 4 years    Types: Cigarettes    Quit date: 06/14/1967  . Smokeless tobacco: Never Used  . Alcohol Use: No    Family History Family History  Problem Relation Age of Onset  . Hypertension Mother   . Cancer Mother 75    ovarian  . Heart disease Father     chf  . Heart attack Father 67  . Cancer Sister     breast- just finished her last radiation  . Heart disease Maternal Grandfather     ?  [name] Heart attack Paternal Grandfather   . Diabetes Neg Hx     Prior to Admission medications   Medication Sig Start Date End Date Taking? Authorizing Provider  acetaminophen (TYLENOL) 500 MG tablet Take 1,000 mg by mouth every 6 (six) hours as needed for mild pain.   Yes Historical Provider, MD  aspirin EC 81 MG tablet Take 81 mg by mouth daily with breakfast.    Yes Historical Provider, MD  diclofenac sodium (VOLTAREN) 1 % GEL Apply 2 g topically 3 (three) times daily as needed (pain).    Yes Historical Provider, MD  ibuprofen (ADVIL,MOTRIN) 200 MG tablet Take 400 mg by mouth every 6 (six) hours as needed for mild pain.   Yes Historical Provider, MD  insulin NPH Human (HUMULIN N) 100 UNIT/ML injection Inject 0.35 mLs (35 Units total) into the skin at bedtime. 03/21/14  Yes Renato Shin, MD  insulin regular (HUMULIN R) 100 units/mL injection 3 times a day (just before each meal), 40-30-45 units. 03/21/14  Yes Renato Shin, MD  Insulin Syringe-Needle U-100 (RELION INSULIN SYR 0.5ML/31G) 31G X 5/16" 0.5 ML MISC USE TO INJECT INSULIN FOUR TIMES PER DAY DX code E11.9 04/02/14  Yes Renato Shin, MD  losartan (COZAAR) 50 MG tablet Take 1 tablet (50 mg total) by mouth 2 (two) times daily. Patient taking differently: Take 75 mg by mouth daily. Pt is taking 1 1/2 pill daily. 03/18/14  Yes Mosie Lukes, MD  Multiple Vitamin (MULTIVITAMIN WITH MINERALS) TABS  tablet Take 1 tablet by mouth daily.   Yes Historical Provider, MD  simvastatin (ZOCOR) 20 MG tablet Take 1 tablet (20 mg total) by mouth every evening. 09/18/13  Yes Mosie Lukes, MD    Allergies  Allergen Reactions  . Ciprofloxacin Other (  See Comments)    Abdominal pain  . Lipitor [Atorvastatin Calcium] Other (See Comments)    Severe muscle pain  . Lisinopril Cough  . Metformin And Related Diarrhea    Physical Exam  Vitals  Blood pressure 135/70, pulse 93, temperature 98.5 F (36.9 C), temperature source Oral, resp. rate 18, height 5\' 10"  (1.778 m), weight 245 lb (111.131 kg), SpO2 93 %.   General:  lying in bed in NAD  Psych:  Normal affect and insight, Not Suicidal or Homicidal, Awake Alert, Oriented X 3.  Neuro:   No F.N deficits, ALL C.Nerves Intact, Strength 5/5 all 4 extremities, Sensation intact all 4 extremities.  ENT:  Ears and Eyes appear Normal, Conjunctivae clear, PER. Moist oral mucosa without erythema or exudates.  Neck:  Supple, No lymphadenopathy appreciated  Respiratory:  Symmetrical chest wall movement, Good air movement bilaterally, CTAB.  Cardiac:  RRR, No Murmurs, no LE edema noted, no JVD.    Abdomen:  Absent bowel sounds, Soft, Non tender, slightly distended, NG tube in place with bilious returns, No masses appreciated  Skin:  No Cyanosis, Normal Skin Turgor, No Skin Rash or Bruise.  Extremities:  Able to move all 4. 5/5 strength in each,  no effusions.  Data Review  CBC  Recent Labs Lab 04/16/14 1022 04/19/14 0519  WBC 5.6 7.3  HGB 14.9 15.3  HCT 43.4 44.0  PLT 189.0 164  MCV 90.1 89.2  MCH  --  31.0  MCHC 34.2 34.8  RDW 13.4 13.4  LYMPHSABS  --  0.8  MONOABS  --  0.6  EOSABS  --  0.0  BASOSABS  --  0.0    Chemistries   Recent Labs Lab 04/16/14 1022 04/19/14 0519  NA 136 138  K 4.3 4.0  CL 104 100  CO2 27 29  GLUCOSE 167* 160*  BUN 16 18  CREATININE 0.76 0.99  CALCIUM 9.3 8.8  AST 26 27  ALT 30 31  ALKPHOS 79 73   BILITOT 0.6 1.0    estimated creatinine clearance is 87.9 mL/min (by C-G formula based on Cr of 0.99).   Recent Labs  04/16/14 1022  TSH 1.19    Coagulation profile No results for input(s): INR, PROTIME in the last 168 hours.  No results for input(s): DDIMER in the last 72 hours.  Cardiac Enzymes No results for input(s): CKMB, TROPONINI, MYOGLOBIN in the last 168 hours.  Invalid input(s): CK  Invalid input(s): POCBNP  Urinalysis    Component Value Date/Time   COLORURINE YELLOW 04/24/2013 1130   APPEARANCEUR CLEAR 04/24/2013 1130   LABSPEC 1.025 04/24/2013 1130   PHURINE 5.5 04/24/2013 1130   GLUCOSEU >1000* 04/24/2013 1130   HGBUR SMALL* 04/24/2013 1130   BILIRUBINUR NEGATIVE 04/24/2013 1130   KETONESUR NEGATIVE 04/24/2013 1130   PROTEINUR NEGATIVE 04/24/2013 1130   UROBILINOGEN 0.2 04/24/2013 1130   NITRITE NEGATIVE 04/24/2013 1130   LEUKOCYTESUR NEGATIVE 04/24/2013 1130    Imaging results:   Ct Abdomen Pelvis W Contrast  04/19/2014   CLINICAL DATA:  Generalized abdominal pain.  Nausea and vomiting.  EXAM: CT ABDOMEN AND PELVIS WITH CONTRAST  TECHNIQUE: Multidetector CT imaging of the abdomen and pelvis was performed using the standard protocol following bolus administration of intravenous contrast.  CONTRAST:  64mL OMNIPAQUE IOHEXOL 300 MG/ML SOLN, 174mL OMNIPAQUE IOHEXOL 300 MG/ML SOLN  COMPARISON:  04/24/2013.  06/13/2011  FINDINGS: There is mild chronic atelectasis at the right lung base with elevation of right hemidiaphragm. No consolidation to  suggest pneumonia. Coronary artery calcifications are seen.  Stomach is distended with ingested contrast. There are dilated proximal small bowel loops that are fluid-filled. There is general transition from dilated to nondilated bowel loops in the right mid abdomen, no abrupt transition point is seen. This is, however at site of prior small bowel obstruction from 2013 and may be related to adhesions. Small bowel loops distal  to this are decompressed. Small to moderate volume of colonic stool. No colonic wall thickening. Multiple colonic diverticula involving the descending colon without diverticulitis. No free air, free fluid, or intra-abdominal fluid collection.  The liver demonstrates diffusely decreased density without focal hepatic lesion. Dependent gallstones in the gallbladder which is physiologically distended. No pericholecystic inflammatory change. The spleen, adrenal glands, and pancreas are normal. Kidneys demonstrate symmetric enhancement and excretion without hydronephrosis or perinephric stranding. Small fat containing umbilical hernia.  The abdominal aorta is normal in caliber with moderate atherosclerosis. No retroperitoneal adenopathy.  Within the pelvis the urinary bladder is physiologically distended. Prostate gland is normal in size. No pelvic free fluid or adenopathy.  Multilevel degenerative change throughout the spine. No acute osseous abnormality.  IMPRESSION: 1. Findings suggest partial small bowel obstruction, with general transition from dilated to nondilated in the right mid abdomen. This is at a similar site seen on CT of March 2013 and suggest adhesions. 2. Incidental findings of cholelithiasis, colonic diverticulosis, and hepatic steatosis.   Electronically Signed   By: Jeb Levering M.D.   On: 04/19/2014 07:06    EKG: pending   Assessment & Plan  Active Problems:   Partial small bowel obstruction -Appreciate surgery assistance -Admit to 6 N. medical surgical bed -Continue conservative medical management with bowel rest/NG tube -IV fluid normal saline at 125 mL per hour -Follow-up acute abdominal series in the a.m. -Minimize narcotic usage -Mobilize as tolerated    H/O exertional chest pain -Currently chest pain-free in the emergency department -Per patient report has outpatient cardiology evaluation pending -EDP has ordered EKG which is also pending -No indication at this time to  cycle cardiac enzymes or perform any other cardiac evaluation acutely    Hypertension -Hold home medications while nothing by mouth -Lopressor IV when necessary with parameters -Current blood pressure controlled    Diabetes mellitus type 2 in obese -CBGs as outpatient reported as well controlled; follows with endocrinologist Dr. Loanne Drilling -Last hemoglobin A1c was 7.9 on 03/21/14 -We'll utilize non-dextrose containing fluids for now -Patient normally takes high-dose NPH insulin; provide 15 units for now with moderate sliding scale coverage and adjust as indicated    Hyperlipidemia -Nothing by mouth so hold statin    Hepatic steatosis -Likely from ongoing obesity and to some degree diabetes -LFTs are normal    DVT Prophylaxis: Subcutaneous heparin  Family Communication:   Wife at bedside  Code Status:  Full  Condition:  Stable  Time spent in minutes : 60   Orvetta Danielski L. ANP on 04/19/2014 at 9:55 AM  Between 7am to 7pm - Pager - [phone]  After 7pm go to [email or web address] - password [email or web address]  And look for the night coverage person covering me after hours  [facility]

## 2014-04-19 NOTE — Progress Notes (Signed)
EKG reviewed. No ischemic changes. Discussed with attending- check ECHO.  Erin Hearing, ANP

## 2014-04-19 NOTE — ED Provider Notes (Signed)
CSN: [id number]     Arrival date & time 04/19/14  0503 History   First MD Initiated Contact with Patient 04/19/14 [phone]     Chief Complaint  Patient presents with  . Abdominal Pain     (Consider location/radiation/quality/duration/timing/severity/associated sxs/prior Treatment) HPI Patient presents with abdominal distention and pain starting yesterday. He states the pain has constantly been worsening. He had 2 episodes of vomiting yesterday but denies current nausea. Last bowel movement was yesterday morning. States he's not been passing flatus since. No fever or chills. Sick contacts. Patient states he's had prior small bowel instructions and that his symptoms are similar. Past Medical History  Diagnosis Date  . Hypertension   . Multiple lipomas     hx. mulitple and some remains -arms, legs  . Diverticulosis of colon     LEFT SIDE  . History of small bowel obstruction     X2  28 YRS AGO AND LAST ONE 06-02-2001--  RESOLVED WITHOUT SURGICAL INTERVENTION  . Type 2 diabetes mellitus   . History of kidney stones   . Left ureteral calculus   . Complication of anesthesia     11'12 Surgery with excrutiating "head about to explode" upon awakening  -lasted several hours postop  ( SURGERY DONE 08-13-2011 AT [facility] PT DID OK POST OP)  . At risk for sleep apnea     STOP-BANG =  4   SENT TO PCP 04-26-2013  . Chicken pox as a child  . Measles as a child  . Epistaxis   . Hyperlipidemia   . Recurrent kidney stones 07/08/2013    Follows with Dr Gaynelle Arabian He believes they are Calcium oxalate stones  . Obesity, unspecified 09/18/2013  . SBO (small bowel obstruction) 04/19/2014  . Hand pain 04/21/2014   Past Surgical History  Procedure Laterality Date  . Lipoma excision  1975    multiple, 10-12  . Knee arthroscopy  08/13/2011    right ARTHROSCOPY KNEE;  Surgeon: Gearlean Alf, MD;  Location: [facility];  Service: Orthopedics;  Laterality: Right;  medial  meniscal debridement, excision of plica, synovectomy   . Knee arthroscopy w/ meniscectomy Right 01-29-2011    twice, first trimmed mediscus and cyst  . Colonoscopy w/ polypectomy  LAST ONE 2013  . Knee arthroscopy Left 11/16/2013    Procedure: LEFT KNEE ARTHROSCOPY MEDIAL MENISCAL DEBRIDEMENT, CHONDROPLASTY;  Surgeon: Gearlean Alf, MD;  Location: [facility];  Service: Orthopedics;  Laterality: Left;   Family History  Problem Relation Age of Onset  . Hypertension Mother   . Cancer Mother 42    ovarian  . Heart disease Father     chf  . Heart attack Father 43  . Cancer Sister     breast- just finished her last radiation  . Heart disease Maternal Grandfather     ?  [name] Heart attack Paternal Grandfather   . Diabetes Neg Hx    History  Substance Use Topics  . Smoking status: Former Smoker -- 1.00 packs/day for 4 years    Types: Cigarettes    Quit date: 06/14/1967  . Smokeless tobacco: Never Used  . Alcohol Use: No    Review of Systems  Constitutional: Negative for fever and chills.  Respiratory: Negative for shortness of breath.   Cardiovascular: Negative for chest pain.  Gastrointestinal: Positive for vomiting, abdominal pain and abdominal distention. Negative for diarrhea and constipation.  Genitourinary: Negative for dysuria, frequency and flank pain.  Musculoskeletal: Negative for back pain, neck pain and  neck stiffness.  Skin: Negative for rash and wound.  Neurological: Negative for dizziness, weakness, light-headedness, numbness and headaches.  All other systems reviewed and are negative.     Allergies  Ciprofloxacin; Lipitor; Lisinopril; and Metformin and related  Home Medications   Prior to Admission medications   Medication Sig Start Date End Date Taking? Authorizing Provider  acetaminophen (TYLENOL) 500 MG tablet Take 1,000 mg by mouth every 6 (six) hours as needed for mild pain.   Yes Historical Provider, MD  aspirin EC 81 MG tablet Take 81 mg by mouth daily with breakfast.    Yes Historical Provider, MD  insulin  NPH Human (HUMULIN N) 100 UNIT/ML injection Inject 0.35 mLs (35 Units total) into the skin at bedtime. 03/21/14  Yes Renato Shin, MD  insulin regular (HUMULIN R) 100 units/mL injection 3 times a day (just before each meal), 40-30-45 units. 03/21/14  Yes Renato Shin, MD  Insulin Syringe-Needle U-100 (RELION INSULIN SYR 0.5ML/31G) 31G X 5/16" 0.5 ML MISC USE TO INJECT INSULIN FOUR TIMES PER DAY DX code E11.9 04/02/14  Yes Renato Shin, MD  losartan (COZAAR) 50 MG tablet Take 1 tablet (50 mg total) by mouth 2 (two) times daily. Patient taking differently: Take 75 mg by mouth daily. Pt is taking 1 1/2 pill daily. 03/18/14  Yes Mosie Lukes, MD  Multiple Vitamin (MULTIVITAMIN WITH MINERALS) TABS tablet Take 1 tablet by mouth daily.   Yes Historical Provider, MD  simvastatin (ZOCOR) 20 MG tablet Take 1 tablet (20 mg total) by mouth every evening. 09/18/13  Yes Mosie Lukes, MD  pantoprazole (PROTONIX) 40 MG tablet Take 1 tablet (40 mg total) by mouth daily. Patient not taking: Reported on 04/22/2014 04/22/14   Verlee Monte, MD   BP 159/76 mmHg  Pulse 72  Temp(Src) 97.6 F (36.4 C) (Oral)  Resp 12  Ht 5\' 10"  (1.778 m)  Wt 231 lb 4.8 oz (104.917 kg)  BMI 33.19 kg/m2  SpO2 95% Physical Exam  Constitutional: He is oriented to person, place, and time. He appears well-developed and well-nourished. No distress.  HENT:  Head: Normocephalic and atraumatic.  Mouth/Throat: Oropharynx is clear and moist.  Eyes: EOM are normal. Pupils are equal, round, and reactive to light.  Neck: Normal range of motion. Neck supple.  Cardiovascular: Normal rate and regular rhythm.   Pulmonary/Chest: Effort normal and breath sounds normal. No respiratory distress. He has no wheezes. He has no rales. He exhibits no tenderness.  Abdominal: Soft. He exhibits distension. He exhibits no mass. There is tenderness (diffuse tenderness to palpation.). There is no rebound and no guarding.  Decreased bowel sounds throughout.   Musculoskeletal: Normal range of motion. He exhibits no edema or tenderness.  Neurological: He is alert and oriented to person, place, and time.  Moves all extremities without deficit. Sensation is grossly intact.  Skin: Skin is warm and dry. No rash noted. No erythema.  Psychiatric: He has a normal mood and affect. His behavior is normal.  Nursing note and vitals reviewed.   ED Course  Procedures (including critical care time) Labs Review Labs Reviewed  CBC WITH DIFFERENTIAL/PLATELET - Abnormal; Notable for the following:    Neutrophils Relative % 82 (*)    Lymphocytes Relative 10 (*)    All other components within normal limits  COMPREHENSIVE METABOLIC PANEL - Abnormal; Notable for the following:    Glucose, Bld 160 (*)    GFR calc non Af Amer 82 (*)    All other components  within normal limits  GLUCOSE, CAPILLARY - Abnormal; Notable for the following:    Glucose-Capillary 145 (*)    All other components within normal limits  GLUCOSE, CAPILLARY - Abnormal; Notable for the following:    Glucose-Capillary 175 (*)    All other components within normal limits  COMPREHENSIVE METABOLIC PANEL - Abnormal; Notable for the following:    Glucose, Bld 159 (*)    Calcium 8.1 (*)    Albumin 3.1 (*)    GFR calc non Af Amer 87 (*)    All other components within normal limits  CBC - Abnormal; Notable for the following:    Platelets 143 (*)    All other components within normal limits  GLUCOSE, CAPILLARY - Abnormal; Notable for the following:    Glucose-Capillary 158 (*)    All other components within normal limits  GLUCOSE, CAPILLARY - Abnormal; Notable for the following:    Glucose-Capillary 151 (*)    All other components within normal limits  GLUCOSE, CAPILLARY - Abnormal; Notable for the following:    Glucose-Capillary 146 (*)    All other components within normal limits  OCCULT BLOOD GASTRIC / DUODENUM (SPECIMEN CUP) - Abnormal; Notable for the following:    Occult Blood, Gastric  POSITIVE (*)    All other components within normal limits  GLUCOSE, CAPILLARY - Abnormal; Notable for the following:    Glucose-Capillary 157 (*)    All other components within normal limits  GLUCOSE, CAPILLARY - Abnormal; Notable for the following:    Glucose-Capillary 210 (*)    All other components within normal limits  GLUCOSE, CAPILLARY - Abnormal; Notable for the following:    Glucose-Capillary 131 (*)    All other components within normal limits  GLUCOSE, CAPILLARY - Abnormal; Notable for the following:    Glucose-Capillary 195 (*)    All other components within normal limits  GLUCOSE, CAPILLARY - Abnormal; Notable for the following:    Glucose-Capillary 162 (*)    All other components within normal limits  GLUCOSE, CAPILLARY - Abnormal; Notable for the following:    Glucose-Capillary 124 (*)    All other components within normal limits  GLUCOSE, CAPILLARY - Abnormal; Notable for the following:    Glucose-Capillary 122 (*)    All other components within normal limits  GLUCOSE, CAPILLARY - Abnormal; Notable for the following:    Glucose-Capillary 149 (*)    All other components within normal limits  GLUCOSE, CAPILLARY - Abnormal; Notable for the following:    Glucose-Capillary 170 (*)    All other components within normal limits  GLUCOSE, CAPILLARY - Abnormal; Notable for the following:    Glucose-Capillary 160 (*)    All other components within normal limits  GLUCOSE, CAPILLARY - Abnormal; Notable for the following:    Glucose-Capillary 204 (*)    All other components within normal limits  GLUCOSE, CAPILLARY - Abnormal; Notable for the following:    Glucose-Capillary 178 (*)    All other components within normal limits  GLUCOSE, CAPILLARY - Abnormal; Notable for the following:    Glucose-Capillary 173 (*)    All other components within normal limits  GLUCOSE, CAPILLARY - Abnormal; Notable for the following:    Glucose-Capillary 167 (*)    All other components within  normal limits  GLUCOSE, CAPILLARY - Abnormal; Notable for the following:    Glucose-Capillary 183 (*)    All other components within normal limits  LIPASE, BLOOD  URINALYSIS, ROUTINE W REFLEX MICROSCOPIC  I-STAT TROPOININ, ED  Imaging Review No results found.   EKG Interpretation   Date/Time:  Friday April 19 2014 12:18:18 EST Ventricular Rate:  90 PR Interval:  166 QRS Duration: 136 QT Interval:  381 QTC Calculation: 466 R Axis:   88 Text Interpretation:  Sinus rhythm V1 has a + R wave compared to before No  RBBB Confirmed by Kathrynn Humble, MD, Thelma Comp [phone]) on 04/19/2014 12:36:06 PM      MDM   Final diagnoses:  Abdominal pain  Small bowel obstruction      Discussed with general surgery and will see in ED.   Julianne Rice, MD 04/25/14 [phone]

## 2014-04-19 NOTE — ED Notes (Signed)
Patient transported to CT 

## 2014-04-19 NOTE — Consult Note (Signed)
Rennert [birth year]  [id number].   Primary Care MD: Dr. Penni Homans Requesting MD: Dr. Lala Lund Chief Complaint/Reason for Consult: PSBO HPI: This is a 69 yo white male who had a hx of HTN, DM, and prior SBO of unknown etiology given he has never had abdominal surgery.  He was referred to GI as an outpatient.  He had a colonoscopy which was negative except for some polyps.  He has done well since then.  Wednesday night he had one episode of nausea and vomiting.  He had a BM the next morning, but did not feel well all day.  Last night, he began having significant diffuse abdominal pain.  He stopped passing flatus.  He presented to the [facility] for further evaluation.  His labs are normal.  He had a CT scan that revealed a partial small bowel obstruction, likely close to the same location in 2013.  He did begin to pass gas some once here.  An NGT was placed in the ED and we were asked to evaluate the patient for further evaluation.  ROS : Please see HPI, otherwise negative except for chest pain and tightness with exertion, but goes away with rest.  Family History  Problem Relation Age of Onset  . Hypertension Mother   . Cancer Mother 66    ovarian  . Heart disease Father     chf  . Heart attack Father 41  . Cancer Sister     breast- just finished her last radiation  . Heart disease Maternal Grandfather     ?  [name] Heart attack Paternal Grandfather   . Diabetes Neg Hx     Past Medical History  Diagnosis Date  . Hypertension   . Multiple lipomas     hx. mulitple and some remains -arms, legs  . Diverticulosis of colon     LEFT SIDE  . History of small bowel obstruction     X2  28 YRS AGO AND LAST ONE 06-02-2001--  RESOLVED WITHOUT SURGICAL INTERVENTION  . Type 2 diabetes mellitus   . History of kidney stones   . Left ureteral calculus   . Complication of anesthesia     11'12 Surgery with excrutiating "head about to explode" upon awakening  -lasted several hours postop  (  SURGERY DONE 08-13-2011 AT [facility] PT DID OK POST OP)  . At risk for sleep apnea     STOP-BANG =  4   SENT TO PCP 04-26-2013  . Chicken pox as a child  . Measles as a child  . Epistaxis   . Hyperlipidemia   . Recurrent kidney stones 07/08/2013    Follows with Dr Gaynelle Arabian He believes they are Calcium oxalate stones  . Obesity, unspecified 09/18/2013    Past Surgical History  Procedure Laterality Date  . Lipoma excision  1975    multiple, 10-12  . Knee arthroscopy  08/13/2011    right ARTHROSCOPY KNEE;  Surgeon: Gearlean Alf, MD;  Location: [facility];  Service: Orthopedics;  Laterality: Right;  medial  meniscal debridement, excision of plica, synovectomy  . Knee arthroscopy w/ meniscectomy Right 01-29-2011    twice, first trimmed mediscus and cyst  . Colonoscopy w/ polypectomy  LAST ONE 2013  . Knee arthroscopy Left 11/16/2013    Procedure: LEFT KNEE ARTHROSCOPY MEDIAL MENISCAL DEBRIDEMENT, CHONDROPLASTY;  Surgeon: Gearlean Alf, MD;  Location: [facility];  Service: Orthopedics;  Laterality: Left;    Social History:  reports that he quit smoking about  46 years ago. His smoking use included Cigarettes. He has a 4 pack-year smoking history. He has never used smokeless tobacco. He reports that he does not drink alcohol or use illicit drugs.  Allergies:  Allergies  Allergen Reactions  . Ciprofloxacin Other (See Comments)    Abdominal pain  . Lipitor [Atorvastatin Calcium] Other (See Comments)    Severe muscle pain  . Lisinopril Cough  . Metformin And Related Diarrhea     (Not in a hospital admission)  Blood pressure 147/80, pulse 91, temperature 98.5 F (36.9 C), temperature source Oral, resp. rate 15, height 5' 10" (1.778 m), weight 245 lb (111.131 kg), SpO2 95 %. Physical Exam: General: pleasant, obese white male who is laying in bed in NAD HEENT: head is normocephalic, atraumatic.  Sclera are noninjected.  PERRL.  Ears and nose without any masses or lesions.  Mouth is pink and  moist Heart: regular, rate, and rhythm.  Normal s1,s2. No obvious murmurs, gallops, or rubs noted.  Palpable radial and pedal pulses bilaterally Lungs: CTAB, no wheezes, rhonchi, or rales noted.  Respiratory effort nonlabored Abd: soft, mildly tender in epigastrium and slightly worse in RUQ to right mid quadrant, ND, +BS, no masses or organomegaly, very small umbilical hernia MS: all 4 extremities are symmetrical with no cyanosis, clubbing, or edema. Skin: warm and dry with no masses, lesions, or rashes Psych: A&Ox3 with an appropriate affect.    Results for orders placed or performed during the hospital encounter of 04/19/14 (from the past 48 hour(s))  CBC with Differential/Platelet     Status: Abnormal   Collection Time: 04/19/14  5:19 AM  Result Value Ref Range   WBC 7.3 4.0 - 10.5 K/uL   RBC 4.93 4.22 - 5.81 MIL/uL   Hemoglobin 15.3 13.0 - 17.0 g/dL   HCT 44.0 39.0 - 52.0 %   MCV 89.2 78.0 - 100.0 fL   MCH 31.0 26.0 - 34.0 pg   MCHC 34.8 30.0 - 36.0 g/dL   RDW 13.4 11.5 - 15.5 %   Platelets 164 150 - 400 K/uL   Neutrophils Relative % 82 (H) 43 - 77 %   Neutro Abs 6.0 1.7 - 7.7 K/uL   Lymphocytes Relative 10 (L) 12 - 46 %   Lymphs Abs 0.8 0.7 - 4.0 K/uL   Monocytes Relative 8 3 - 12 %   Monocytes Absolute 0.6 0.1 - 1.0 K/uL   Eosinophils Relative 0 0 - 5 %   Eosinophils Absolute 0.0 0.0 - 0.7 K/uL   Basophils Relative 0 0 - 1 %   Basophils Absolute 0.0 0.0 - 0.1 K/uL  Comprehensive metabolic panel     Status: Abnormal   Collection Time: 04/19/14  5:19 AM  Result Value Ref Range   Sodium 138 135 - 145 mmol/L   Potassium 4.0 3.5 - 5.1 mmol/L   Chloride 100 96 - 112 mmol/L   CO2 29 19 - 32 mmol/L   Glucose, Bld 160 (H) 70 - 99 mg/dL   BUN 18 6 - 23 mg/dL   Creatinine, Ser 0.99 0.50 - 1.35 mg/dL   Calcium 8.8 8.4 - 10.5 mg/dL   Total Protein 6.9 6.0 - 8.3 g/dL   Albumin 3.8 3.5 - 5.2 g/dL   AST 27 0 - 37 U/L   ALT 31 0 - 53 U/L   Alkaline Phosphatase 73 39 - 117 U/L    Total Bilirubin 1.0 0.3 - 1.2 mg/dL   GFR calc non Af Amer 82 (  L) >90 mL/min   GFR calc Af Amer >90 >90 mL/min    Comment: (NOTE) The eGFR has been calculated using the CKD EPI equation. This calculation has not been validated in all clinical situations. eGFR's persistently <90 mL/min signify possible Chronic Kidney Disease.    Anion gap 9 5 - 15  Lipase, blood     Status: None   Collection Time: 04/19/14  5:19 AM  Result Value Ref Range   Lipase 19 11 - 59 U/L  I-stat troponin, ED     Status: None   Collection Time: 04/19/14  5:21 AM  Result Value Ref Range   Troponin i, poc 0.01 0.00 - 0.08 ng/mL   Comment 3            Comment: Due to the release kinetics of cTnI, a negative result within the first hours of the onset of symptoms does not rule out myocardial infarction with certainty. If myocardial infarction is still suspected, repeat the test at appropriate intervals.    Ct Abdomen Pelvis W Contrast  04/19/2014   CLINICAL DATA:  Generalized abdominal pain.  Nausea and vomiting.  EXAM: CT ABDOMEN AND PELVIS WITH CONTRAST  TECHNIQUE: Multidetector CT imaging of the abdomen and pelvis was performed using the standard protocol following bolus administration of intravenous contrast.  CONTRAST:  13m OMNIPAQUE IOHEXOL 300 MG/ML SOLN, 1088mOMNIPAQUE IOHEXOL 300 MG/ML SOLN  COMPARISON:  04/24/2013.  06/13/2011  FINDINGS: There is mild chronic atelectasis at the right lung base with elevation of right hemidiaphragm. No consolidation to suggest pneumonia. Coronary artery calcifications are seen.  Stomach is distended with ingested contrast. There are dilated proximal small bowel loops that are fluid-filled. There is general transition from dilated to nondilated bowel loops in the right mid abdomen, no abrupt transition point is seen. This is, however at site of prior small bowel obstruction from 2013 and may be related to adhesions. Small bowel loops distal to this are decompressed. Small to  moderate volume of colonic stool. No colonic wall thickening. Multiple colonic diverticula involving the descending colon without diverticulitis. No free air, free fluid, or intra-abdominal fluid collection.  The liver demonstrates diffusely decreased density without focal hepatic lesion. Dependent gallstones in the gallbladder which is physiologically distended. No pericholecystic inflammatory change. The spleen, adrenal glands, and pancreas are normal. Kidneys demonstrate symmetric enhancement and excretion without hydronephrosis or perinephric stranding. Small fat containing umbilical hernia.  The abdominal aorta is normal in caliber with moderate atherosclerosis. No retroperitoneal adenopathy.  Within the pelvis the urinary bladder is physiologically distended. Prostate gland is normal in size. No pelvic free fluid or adenopathy.  Multilevel degenerative change throughout the spine. No acute osseous abnormality.  IMPRESSION: 1. Findings suggest partial small bowel obstruction, with general transition from dilated to nondilated in the right mid abdomen. This is at a similar site seen on CT of March 2013 and suggest adhesions. 2. Incidental findings of cholelithiasis, colonic diverticulosis, and hepatic steatosis.   Electronically Signed   By: MeJeb Levering.D.   On: 04/19/2014 07:06   Dg Abd Portable 1v  04/19/2014   CLINICAL DATA:  Nasogastric tube placement. Nausea with abdominal pain  EXAM: PORTABLE ABDOMEN - 1 VIEW  COMPARISON:  CT abdomen and pelvis obtained earlier in the day  FINDINGS: Nasogastric tube tip and side port are in the stomach. Multiple loops of dilated small bowel are consistent with the obstruction pattern seen on CT earlier in the day. No free air seen on this  supine examination.  IMPRESSION: Evidence of small bowel obstruction. Nasogastric tube tip and side port in stomach.   Electronically Signed   By: Lowella Grip M.D.   On: 04/19/2014 10:05       Assessment/Plan 1.  Partial small bowel obstruction, unknown cause 2. DM 3. HTN 4. Chest pain with exertion  Plan: 1. Patient is being admitted.  He has an NGT in place.  Will begin SB protocol this morning with gastrografin.  Repeat films will be obtained in 8 hours after gastrografin given.  If contrast in his colon, then NGT can be removed and clear liquids given.  For now, agree with bowel rest and NGT decompression.  If he does not resolved with conservative management, then he may require an operation.  If this were to be the case, he would need a cardiology consult given his recent symptoms of angina.  , E 04/19/2014, 10:40 AM Pager: [phone]

## 2014-04-20 ENCOUNTER — Inpatient Hospital Stay (HOSPITAL_COMMUNITY): Payer: Medicare Other

## 2014-04-20 DIAGNOSIS — K5669 Other intestinal obstruction: Secondary | ICD-10-CM

## 2014-04-20 DIAGNOSIS — E669 Obesity, unspecified: Secondary | ICD-10-CM

## 2014-04-20 DIAGNOSIS — Z87898 Personal history of other specified conditions: Secondary | ICD-10-CM

## 2014-04-20 DIAGNOSIS — E119 Type 2 diabetes mellitus without complications: Secondary | ICD-10-CM

## 2014-04-20 DIAGNOSIS — E785 Hyperlipidemia, unspecified: Secondary | ICD-10-CM

## 2014-04-20 DIAGNOSIS — I10 Essential (primary) hypertension: Secondary | ICD-10-CM

## 2014-04-20 DIAGNOSIS — R072 Precordial pain: Secondary | ICD-10-CM

## 2014-04-20 LAB — CBC
HCT: 39 % (ref 39.0–52.0)
HEMOGLOBIN: 13.4 g/dL (ref 13.0–17.0)
MCH: 30.8 pg (ref 26.0–34.0)
MCHC: 34.4 g/dL (ref 30.0–36.0)
MCV: 89.7 fL (ref 78.0–100.0)
Platelets: 143 10*3/uL — ABNORMAL LOW (ref 150–400)
RBC: 4.35 MIL/uL (ref 4.22–5.81)
RDW: 13.3 % (ref 11.5–15.5)
WBC: 6.1 10*3/uL (ref 4.0–10.5)

## 2014-04-20 LAB — COMPREHENSIVE METABOLIC PANEL
ALBUMIN: 3.1 g/dL — AB (ref 3.5–5.2)
ALT: 24 U/L (ref 0–53)
ANION GAP: 9 (ref 5–15)
AST: 20 U/L (ref 0–37)
Alkaline Phosphatase: 63 U/L (ref 39–117)
BUN: 14 mg/dL (ref 6–23)
CALCIUM: 8.1 mg/dL — AB (ref 8.4–10.5)
CO2: 27 mmol/L (ref 19–32)
CREATININE: 0.85 mg/dL (ref 0.50–1.35)
Chloride: 102 mmol/L (ref 96–112)
GFR calc Af Amer: >90 mL/min (ref >90)
GFR calc non Af Amer: 87 mL/min — ABNORMAL LOW (ref >90)
Glucose, Bld: 159 mg/dL — ABNORMAL HIGH (ref 70–99)
POTASSIUM: 3.6 mmol/L (ref 3.5–5.1)
Sodium: 138 mmol/L (ref 135–145)
TOTAL PROTEIN: 6 g/dL (ref 6.0–8.3)
Total Bilirubin: 0.8 mg/dL (ref 0.3–1.2)

## 2014-04-20 LAB — GLUCOSE, CAPILLARY
GLUCOSE-CAPILLARY: 131 mg/dL — AB (ref 70–99)
GLUCOSE-CAPILLARY: 151 mg/dL — AB (ref 70–99)
Glucose-Capillary: 146 mg/dL — ABNORMAL HIGH (ref 70–99)
Glucose-Capillary: 157 mg/dL — ABNORMAL HIGH (ref 70–99)
Glucose-Capillary: 210 mg/dL — ABNORMAL HIGH (ref 70–99)
Glucose-Capillary: 195 mg/dL — ABNORMAL HIGH (ref 70–99)
Glucose-Capillary: 162 mg/dL — ABNORMAL HIGH (ref 70–99)

## 2014-04-20 LAB — OCCULT BLOOD GASTRIC / DUODENUM (SPECIMEN CUP): OCCULT BLOOD, GASTRIC: POSITIVE — AB

## 2014-04-20 MED ORDER — CHLORHEXIDINE GLUCONATE 0.12 % MT SOLN
15.0000 mL | Freq: Two times a day (BID) | OROMUCOSAL | Status: DC
  Administered 2014-04-20 – 2014-04-22 (×5): 15 mL via OROMUCOSAL
  Filled 2014-04-20 (×6): qty 15

## 2014-04-20 MED ORDER — PANTOPRAZOLE SODIUM 40 MG PO TBEC
40.0000 mg | DELAYED_RELEASE_TABLET | Freq: Two times a day (BID) | ORAL | Status: DC
  Administered 2014-04-20 – 2014-04-22 (×5): 40 mg via ORAL
  Filled 2014-04-20 (×4): qty 1

## 2014-04-20 NOTE — Progress Notes (Signed)
Patient walked a total of 4 laps around the unit during the day. No use of cane of walker. Patient did not complain of SOB or pain.

## 2014-04-20 NOTE — Progress Notes (Signed)
  Echocardiogram 2D Echocardiogram has been performed.  [name] 04/20/2014, 4:43 PM

## 2014-04-20 NOTE — Progress Notes (Signed)
[facility] PROGRESS NOTE   Michael Cunningham CHE:[id number] DOB: 06-Jul-1945 DOA: 04/19/2014 PCP: Penni Homans, MD  HPI/Subjective: Passing gas this morning, denies any abdominal pain. Suction canister showed brownish fluids which was positive for FOBT. NG tube discontinued and started on clear liquids per general surgery.  Assessment/Plan: Active Problems:   Partial small bowel obstruction   Hypertension   Hyperlipidemia   Diabetes mellitus type 2 in obese   H/O exertional chest pain   Small bowel obstruction    Partial small bowel obstruction Patient was given Gastrografin for small bowel follow through in the ED. Contrast was seen in the colon, patient is passing gas, NG tube discontinued. Patient started on clear liquids. Continue to ambulate, keep potassium above 4.  Blood in the gastric suctioned fluid After 2-3 hours of the suction the gastric fluid started to turn brownish, which tested positive for FOBT. This is likely secondary to mucosal injury from the tube and the suction. Discussed with Dr. Amedeo Plenty of [facility], recommended empiric PPIs. Recommended also to check hemoglobin in a.m. if dropped significantly patient might need more evaluation.  Diabetes mellitus type 2 Currently on clear liquids, when advanced will be our modified diet. Currently on low dose of NPH and NovoLog sliding scale.  Hypertension Home medications and hold, Lopressor IV when necessary.  Code Status: Full Code Family Communication: Plan discussed with the patient. Disposition Plan: Remains inpatient Diet: Diet clear liquid  Consultants:  General surgery  Procedures:  None  Antibiotics:  None   Objective: Filed Vitals:   04/20/14 0451  BP: 151/84  Pulse:   Temp: 98.5 F (36.9 C)  Resp: 20    Intake/Output Summary (Last 24 hours) at 04/20/14 1408 Last data filed at 04/20/14 0946  Gross per 24 hour  Intake 2168.75 ml  Output   1600 ml  Net 568.75 ml   Filed  Weights   04/19/14 0511 04/20/14 0500  Weight: 111.131 kg (245 lb) 110.678 kg (244 lb)    Exam: General: Alert and awake, oriented x3, not in any acute distress. HEENT: anicteric sclera, pupils reactive to light and accommodation, EOMI CVS: S1-S2 clear, no murmur rubs or gallops Chest: clear to auscultation bilaterally, no wheezing, rales or rhonchi Abdomen: soft nontender, nondistended, normal bowel sounds, no organomegaly Extremities: no cyanosis, clubbing or edema noted bilaterally Neuro: Cranial nerves II-XII intact, no focal neurological deficits  Data Reviewed: Basic Metabolic Panel:  Recent Labs Lab 04/16/14 1022 04/19/14 0519 04/20/14 0511  NA 136 138 138  K 4.3 4.0 3.6  CL 104 100 102  CO2 27 29 27   GLUCOSE 167* 160* 159*  BUN 16 18 14   CREATININE 0.76 0.99 0.85  CALCIUM 9.3 8.8 8.1*   Liver Function Tests:  Recent Labs Lab 04/16/14 1022 04/19/14 0519 04/20/14 0511  AST 26 27 20   ALT 30 31 24   ALKPHOS 79 73 63  BILITOT 0.6 1.0 0.8  PROT 6.8 6.9 6.0  ALBUMIN 4.1 3.8 3.1*    Recent Labs Lab 04/19/14 0519  LIPASE 19   No results for input(s): AMMONIA in the last 168 hours. CBC:  Recent Labs Lab 04/16/14 1022 04/19/14 0519 04/20/14 0511  WBC 5.6 7.3 6.1  NEUTROABS  --  6.0  --   HGB 14.9 15.3 13.4  HCT 43.4 44.0 39.0  MCV 90.1 89.2 89.7  PLT 189.0 164 143*   Cardiac Enzymes: No results for input(s): CKTOTAL, CKMB, CKMBINDEX, TROPONINI in the last 168 hours. BNP (last 3 results)  No results for input(s): BNP in the last 8760 hours.  ProBNP (last 3 results) No results for input(s): PROBNP in the last 8760 hours.  CBG:  Recent Labs Lab 04/19/14 2014 04/20/14 0023 04/20/14 0422 04/20/14 0758 04/20/14 1159  GLUCAP 158* 151* 146* 157* 210*    Micro No results found for this or any previous visit (from the past 240 hour(s)).   Studies: Ct Abdomen Pelvis W Contrast  04/19/2014   CLINICAL DATA:  Generalized abdominal pain.  Nausea  and vomiting.  EXAM: CT ABDOMEN AND PELVIS WITH CONTRAST  TECHNIQUE: Multidetector CT imaging of the abdomen and pelvis was performed using the standard protocol following bolus administration of intravenous contrast.  CONTRAST:  2mL OMNIPAQUE IOHEXOL 300 MG/ML SOLN, 133mL OMNIPAQUE IOHEXOL 300 MG/ML SOLN  COMPARISON:  04/24/2013.  06/13/2011  FINDINGS: There is mild chronic atelectasis at the right lung base with elevation of right hemidiaphragm. No consolidation to suggest pneumonia. Coronary artery calcifications are seen.  Stomach is distended with ingested contrast. There are dilated proximal small bowel loops that are fluid-filled. There is general transition from dilated to nondilated bowel loops in the right mid abdomen, no abrupt transition point is seen. This is, however at site of prior small bowel obstruction from 2013 and may be related to adhesions. Small bowel loops distal to this are decompressed. Small to moderate volume of colonic stool. No colonic wall thickening. Multiple colonic diverticula involving the descending colon without diverticulitis. No free air, free fluid, or intra-abdominal fluid collection.  The liver demonstrates diffusely decreased density without focal hepatic lesion. Dependent gallstones in the gallbladder which is physiologically distended. No pericholecystic inflammatory change. The spleen, adrenal glands, and pancreas are normal. Kidneys demonstrate symmetric enhancement and excretion without hydronephrosis or perinephric stranding. Small fat containing umbilical hernia.  The abdominal aorta is normal in caliber with moderate atherosclerosis. No retroperitoneal adenopathy.  Within the pelvis the urinary bladder is physiologically distended. Prostate gland is normal in size. No pelvic free fluid or adenopathy.  Multilevel degenerative change throughout the spine. No acute osseous abnormality.  IMPRESSION: 1. Findings suggest partial small bowel obstruction, with general  transition from dilated to nondilated in the right mid abdomen. This is at a similar site seen on CT of March 2013 and suggest adhesions. 2. Incidental findings of cholelithiasis, colonic diverticulosis, and hepatic steatosis.   Electronically Signed   By: Jeb Levering M.D.   On: 04/19/2014 07:06   Dg Abd Portable 1v  04/19/2014   CLINICAL DATA:  Partial small bowel obstruction. Small bowel obstruction protocol. Evaluate for contrast in the colon.  EXAM: PORTABLE ABDOMEN - 1 VIEW  COMPARISON:  CT and plain films of earlier in the day.  FINDINGS: Persistent small bowel dilatation, with small bowel loop measuring 4.4 cm in the left side of the abdomen. Compare 5.1 cm on the other plain film. Contrast is seen within the small bowel, as well as throughout the imaged ascending and transverse colon. The descending colon is partially excluded. Contrast also seen within the urinary bladder. No gross free intraperitoneal air. Nasogastric tube terminates at the body of the stomach.  IMPRESSION: Contrast transit into the colon, as detailed above.  Improved small bowel obstruction pattern.   Electronically Signed   By: Abigail Miyamoto M.D.   On: 04/19/2014 21:25   Dg Abd Portable 1v  04/19/2014   CLINICAL DATA:  Nasogastric tube placement. Nausea with abdominal pain  EXAM: PORTABLE ABDOMEN - 1 VIEW  COMPARISON:  CT abdomen  and pelvis obtained earlier in the day  FINDINGS: Nasogastric tube tip and side port are in the stomach. Multiple loops of dilated small bowel are consistent with the obstruction pattern seen on CT earlier in the day. No free air seen on this supine examination.  IMPRESSION: Evidence of small bowel obstruction. Nasogastric tube tip and side port in stomach.   Electronically Signed   By: Lowella Grip M.D.   On: 04/19/2014 10:05    Scheduled Meds: . chlorhexidine  15 mL Mouth Rinse BID  . insulin aspart  0-15 Units Subcutaneous 6 times per day  . insulin NPH Human  15 Units Subcutaneous QAC  breakfast  . pantoprazole  40 mg Oral BID   Continuous Infusions: . sodium chloride 50 mL/hr at 04/20/14 1320       Time spent: 35 minutes    [facility]  [facility] Pager [phone] If 7PM-7AM, please contact night-coverage at [email or web address], password [facility] 04/20/2014, 2:08 PM  LOS: 1 day

## 2014-04-20 NOTE — Progress Notes (Signed)
[facility] Progress Note     Subjective: Pt feels much better.  Distension and pain improved, no N/V.  Having flatus.  No BM yet.  Hungry/thirsty.  NG output is sanguinous and was heme positive.  Not been OOB much.  No other complatints.  Wants to go home tomorrow.  Objective: Vital signs in last 24 hours: Temp:  [98.4 F (36.9 C)-98.5 F (36.9 C)] 98.5 F (36.9 C) (02/06 0451) Pulse Rate:  [91-93] 93 (02/05 1130) Resp:  [14-20] 20 (02/06 0451) BP: (147-153)/(75-84) 151/84 mmHg (02/06 0451) SpO2:  [93 %-95 %] 94 % (02/06 0451) Weight:  [244 lb (110.678 kg)] 244 lb (110.678 kg) (02/06 0500) Last BM Date: 04/18/14  Intake/Output from previous day: 02/05 0701 - 02/06 0700 In: 2168.8 [I.V.:2168.8] Out: 1800 [Urine:850; Emesis/NG output:950] Intake/Output this shift:    PE: Gen:  Alert, NAD, pleasant Abd: Soft, mild distension, minimal tenderness, +BS, no HSM   Lab Results:   Recent Labs  04/19/14 0519 04/20/14 0511  WBC 7.3 6.1  HGB 15.3 13.4  HCT 44.0 39.0  PLT 164 143*   BMET  Recent Labs  04/19/14 0519 04/20/14 0511  NA 138 138  K 4.0 3.6  CL 100 102  CO2 29 27  GLUCOSE 160* 159*  BUN 18 14  CREATININE 0.99 0.85  CALCIUM 8.8 8.1*   PT/INR No results for input(s): LABPROT, INR in the last 72 hours. CMP     Component Value Date/Time   NA 138 04/20/2014 0511   K 3.6 04/20/2014 0511   CL 102 04/20/2014 0511   CO2 27 04/20/2014 0511   GLUCOSE 159* 04/20/2014 0511   BUN 14 04/20/2014 0511   CREATININE 0.85 04/20/2014 0511   CREATININE 0.71 07/13/2013 0855   CALCIUM 8.1* 04/20/2014 0511   PROT 6.0 04/20/2014 0511   ALBUMIN 3.1* 04/20/2014 0511   AST 20 04/20/2014 0511   ALT 24 04/20/2014 0511   ALKPHOS 63 04/20/2014 0511   BILITOT 0.8 04/20/2014 0511   GFRNONAA 87* 04/20/2014 0511   GFRAA >90 04/20/2014 0511   Lipase     Component Value Date/Time   LIPASE 19 04/19/2014 0519       Studies/Results: Ct Abdomen Pelvis W  Contrast  04/19/2014   CLINICAL DATA:  Generalized abdominal pain.  Nausea and vomiting.  EXAM: CT ABDOMEN AND PELVIS WITH CONTRAST  TECHNIQUE: Multidetector CT imaging of the abdomen and pelvis was performed using the standard protocol following bolus administration of intravenous contrast.  CONTRAST:  40mL OMNIPAQUE IOHEXOL 300 MG/ML SOLN, 167mL OMNIPAQUE IOHEXOL 300 MG/ML SOLN  COMPARISON:  04/24/2013.  06/13/2011  FINDINGS: There is mild chronic atelectasis at the right lung base with elevation of right hemidiaphragm. No consolidation to suggest pneumonia. Coronary artery calcifications are seen.  Stomach is distended with ingested contrast. There are dilated proximal small bowel loops that are fluid-filled. There is general transition from dilated to nondilated bowel loops in the right mid abdomen, no abrupt transition point is seen. This is, however at site of prior small bowel obstruction from 2013 and may be related to adhesions. Small bowel loops distal to this are decompressed. Small to moderate volume of colonic stool. No colonic wall thickening. Multiple colonic diverticula involving the descending colon without diverticulitis. No free air, free fluid, or intra-abdominal fluid collection.  The liver demonstrates diffusely decreased density without focal hepatic lesion. Dependent gallstones in the gallbladder which is physiologically distended. No pericholecystic inflammatory change. The spleen, adrenal glands, and pancreas  are normal. Kidneys demonstrate symmetric enhancement and excretion without hydronephrosis or perinephric stranding. Small fat containing umbilical hernia.  The abdominal aorta is normal in caliber with moderate atherosclerosis. No retroperitoneal adenopathy.  Within the pelvis the urinary bladder is physiologically distended. Prostate gland is normal in size. No pelvic free fluid or adenopathy.  Multilevel degenerative change throughout the spine. No acute osseous abnormality.   IMPRESSION: 1. Findings suggest partial small bowel obstruction, with general transition from dilated to nondilated in the right mid abdomen. This is at a similar site seen on CT of March 2013 and suggest adhesions. 2. Incidental findings of cholelithiasis, colonic diverticulosis, and hepatic steatosis.   Electronically Signed   By: Jeb Levering M.D.   On: 04/19/2014 07:06   Dg Abd Portable 1v  04/19/2014   CLINICAL DATA:  Partial small bowel obstruction. Small bowel obstruction protocol. Evaluate for contrast in the colon.  EXAM: PORTABLE ABDOMEN - 1 VIEW  COMPARISON:  CT and plain films of earlier in the day.  FINDINGS: Persistent small bowel dilatation, with small bowel loop measuring 4.4 cm in the left side of the abdomen. Compare 5.1 cm on the other plain film. Contrast is seen within the small bowel, as well as throughout the imaged ascending and transverse colon. The descending colon is partially excluded. Contrast also seen within the urinary bladder. No gross free intraperitoneal air. Nasogastric tube terminates at the body of the stomach.  IMPRESSION: Contrast transit into the colon, as detailed above.  Improved small bowel obstruction pattern.   Electronically Signed   By: Abigail Miyamoto M.D.   On: 04/19/2014 21:25   Dg Abd Portable 1v  04/19/2014   CLINICAL DATA:  Nasogastric tube placement. Nausea with abdominal pain  EXAM: PORTABLE ABDOMEN - 1 VIEW  COMPARISON:  CT abdomen and pelvis obtained earlier in the day  FINDINGS: Nasogastric tube tip and side port are in the stomach. Multiple loops of dilated small bowel are consistent with the obstruction pattern seen on CT earlier in the day. No free air seen on this supine examination.  IMPRESSION: Evidence of small bowel obstruction. Nasogastric tube tip and side port in stomach.   Electronically Signed   By: Lowella Grip M.D.   On: 04/19/2014 10:05    Anti-infectives: Anti-infectives    None       Assessment/Plan 1. Partial small  bowel obstruction, unknown cause 2. DM 3. HTN 4. Chest pain with exertion 5. Heme-occult positive NG output  Plan: 1.  NG tube output was heme positive.  Pt improved clinically and radio-graphically from yesterday 2.  Contrast in his colon, so NGT can be removed and clear liquids given.  3.  Hopefully diet advanced as tolerated 4.  Ambulate and IS 5.  Hopefully d/c tomorrow if all goes well    LOS: 1 day    DORT, Lc Joynt 04/20/2014, 9:17 AM Pager: [phone]

## 2014-04-21 ENCOUNTER — Encounter: Payer: Self-pay | Admitting: Family Medicine

## 2014-04-21 LAB — GLUCOSE, CAPILLARY
GLUCOSE-CAPILLARY: 149 mg/dL — AB (ref 70–99)
Glucose-Capillary: 122 mg/dL — ABNORMAL HIGH (ref 70–99)
Glucose-Capillary: 124 mg/dL — ABNORMAL HIGH (ref 70–99)
Glucose-Capillary: 160 mg/dL — ABNORMAL HIGH (ref 70–99)
Glucose-Capillary: 170 mg/dL — ABNORMAL HIGH (ref 70–99)
Glucose-Capillary: 204 mg/dL — ABNORMAL HIGH (ref 70–99)

## 2014-04-21 MED ORDER — POLYETHYLENE GLYCOL 3350 17 G PO PACK
17.0000 g | PACK | Freq: Every day | ORAL | Status: DC | PRN
  Filled 2014-04-21: qty 1

## 2014-04-21 MED ORDER — LOSARTAN POTASSIUM 50 MG PO TABS
75.0000 mg | ORAL_TABLET | Freq: Every day | ORAL | Status: DC
  Administered 2014-04-21 – 2014-04-22 (×2): 75 mg via ORAL
  Filled 2014-04-21 (×2): qty 1

## 2014-04-21 MED ORDER — SIMVASTATIN 20 MG PO TABS
20.0000 mg | ORAL_TABLET | Freq: Every evening | ORAL | Status: DC
  Administered 2014-04-21: 20 mg via ORAL
  Filled 2014-04-21 (×2): qty 1

## 2014-04-21 MED ORDER — POTASSIUM CHLORIDE CRYS ER 20 MEQ PO TBCR
40.0000 meq | EXTENDED_RELEASE_TABLET | Freq: Once | ORAL | Status: AC
  Administered 2014-04-21: 40 meq via ORAL
  Filled 2014-04-21: qty 2

## 2014-04-21 MED ORDER — BISACODYL 10 MG RE SUPP: 10.0000 mg | Freq: Every day | RECTAL | Status: DC | PRN

## 2014-04-21 MED ORDER — HYDROCODONE-ACETAMINOPHEN 5-325 MG PO TABS: 1.0000 | ORAL_TABLET | ORAL | Status: DC | PRN

## 2014-04-21 MED ORDER — ADULT MULTIVITAMIN W/MINERALS CH
1.0000 | ORAL_TABLET | Freq: Every day | ORAL | Status: DC
  Administered 2014-04-21 – 2014-04-22 (×2): 1 via ORAL
  Filled 2014-04-21 (×2): qty 1

## 2014-04-21 NOTE — Assessment & Plan Note (Signed)
Improved on recheck. Adequate control, no changes to meds. Encouraged heart healthy diet such as the DASH diet and exercise as tolerated.

## 2014-04-21 NOTE — Progress Notes (Signed)
Pt. And spouse ask if pt. Should be taking home medications.  Dr. Hartford Poli notified and will look at home medications.

## 2014-04-21 NOTE — Assessment & Plan Note (Signed)
Tolerating statin, encouraged heart healthy diet, avoid trans fats, minimize simple carbs and saturated fats. Increase exercise as tolerated 

## 2014-04-21 NOTE — Progress Notes (Signed)
Utilization review completed.  

## 2014-04-21 NOTE — Progress Notes (Signed)
[facility] PROGRESS NOTE   Michael Cunningham PNT:[id number] DOB: [birth year] DOA: 04/19/2014 PCP: Penni Homans, MD  HPI/Subjective: Feels much better, denies any nausea vomiting. Advance to soft diet, if tolerated might be discharged in a.m.  Assessment/Plan: Active Problems:   Partial small bowel obstruction   Hypertension   Hyperlipidemia   Diabetes mellitus type 2 in obese   H/O exertional chest pain   Small bowel obstruction    Partial small bowel obstruction Patient was given Gastrografin for small bowel follow through in the ED. Contrast was seen in the colon, patient is passing gas, NG tube discontinued. Continue to ambulate, keep potassium above 4. Started yesterday on clear liquid seems to be tolerated very well, advance to soft diet, per general surgery.  Blood in the gastric suctioned fluid After 2-3 hours of the suction the gastric fluid started to turn brownish, which tested positive for FOBT. This is likely secondary to mucosal injury from the tube and the suction. Discussed with Dr. Amedeo Plenty of [facility], recommended empiric PPIs.  Diabetes mellitus type 2 Currently on clear liquids, when advanced will be our modified diet. Currently on low dose of NPH and NovoLog sliding scale.  Hypertension Home medications and hold, Lopressor IV when necessary.  Trace lower extremity edema Echo was done showed normal LVEF but draped one episode dysfunction. Explained to the patient and his wife with a nurse this is likely secondary to prolonged hypertension. Stop IV fluids, if the trace LEE continues as outpatient needs cardiology evaluation.  Code Status: Full Code Family Communication: Plan discussed with the patient. Disposition Plan: Remains inpatient Diet: DIET SOFT  Consultants:  General surgery  Procedures:  None  Antibiotics:  None   Objective: Filed Vitals:   04/21/14 0711  BP: 145/76  Pulse: 73  Temp: 98 F (36.7 C)  Resp: 14     Intake/Output Summary (Last 24 hours) at 04/21/14 1204 Last data filed at 04/21/14 1151  Gross per 24 hour  Intake   1340 ml  Output   2825 ml  Net  -1485 ml   Filed Weights   04/19/14 0511 04/20/14 0500 04/21/14 0709  Weight: 111.131 kg (245 lb) 110.678 kg (244 lb) 100.835 kg (222 lb 4.8 oz)    Exam: General: Alert and awake, oriented x3, not in any acute distress. HEENT: anicteric sclera, pupils reactive to light and accommodation, EOMI CVS: S1-S2 clear, no murmur rubs or gallops Chest: clear to auscultation bilaterally, no wheezing, rales or rhonchi Abdomen: soft nontender, nondistended, normal bowel sounds, no organomegaly Extremities: no cyanosis, clubbing or edema noted bilaterally Neuro: Cranial nerves II-XII intact, no focal neurological deficits  Data Reviewed: Basic Metabolic Panel:  Recent Labs Lab 04/16/14 1022 04/19/14 0519 04/20/14 0511  NA 136 138 138  K 4.3 4.0 3.6  CL 104 100 102  CO2 27 29 27   GLUCOSE 167* 160* 159*  BUN 16 18 14   CREATININE 0.76 0.99 0.85  CALCIUM 9.3 8.8 8.1*   Liver Function Tests:  Recent Labs Lab 04/16/14 1022 04/19/14 0519 04/20/14 0511  AST 26 27 20   ALT 30 31 24   ALKPHOS 79 73 63  BILITOT 0.6 1.0 0.8  PROT 6.8 6.9 6.0  ALBUMIN 4.1 3.8 3.1*    Recent Labs Lab 04/19/14 0519  LIPASE 19   No results for input(s): AMMONIA in the last 168 hours. CBC:  Recent Labs Lab 04/16/14 1022 04/19/14 0519 04/20/14 0511  WBC 5.6 7.3 6.1  NEUTROABS  --  6.0  --  HGB 14.9 15.3 13.4  HCT 43.4 44.0 39.0  MCV 90.1 89.2 89.7  PLT 189.0 164 143*   Cardiac Enzymes: No results for input(s): CKTOTAL, CKMB, CKMBINDEX, TROPONINI in the last 168 hours. BNP (last 3 results) No results for input(s): BNP in the last 8760 hours.  ProBNP (last 3 results) No results for input(s): PROBNP in the last 8760 hours.  CBG:  Recent Labs Lab 04/20/14 2015 04/20/14 2126 04/21/14 0013 04/21/14 0429 04/21/14 0808  GLUCAP 195*  162* 124* 122* 149*    Micro No results found for this or any previous visit (from the past 240 hour(s)).   Studies: Dg Abd Portable 1v  04/19/2014   CLINICAL DATA:  Partial small bowel obstruction. Small bowel obstruction protocol. Evaluate for contrast in the colon.  EXAM: PORTABLE ABDOMEN - 1 VIEW  COMPARISON:  CT and plain films of earlier in the day.  FINDINGS: Persistent small bowel dilatation, with small bowel loop measuring 4.4 cm in the left side of the abdomen. Compare 5.1 cm on the other plain film. Contrast is seen within the small bowel, as well as throughout the imaged ascending and transverse colon. The descending colon is partially excluded. Contrast also seen within the urinary bladder. No gross free intraperitoneal air. Nasogastric tube terminates at the body of the stomach.  IMPRESSION: Contrast transit into the colon, as detailed above.  Improved small bowel obstruction pattern.   Electronically Signed   By: Abigail Miyamoto M.D.   On: 04/19/2014 21:25    Scheduled Meds: . chlorhexidine  15 mL Mouth Rinse BID  . insulin aspart  0-15 Units Subcutaneous 6 times per day  . insulin NPH Human  15 Units Subcutaneous QAC breakfast  . pantoprazole  40 mg Oral BID   Continuous Infusions: . sodium chloride 50 mL/hr at 04/20/14 1320       Time spent: 35 minutes    [facility] A  [facility] Pager [phone] If 7PM-7AM, please contact night-coverage at [email or web address], password [facility] 04/21/2014, 12:04 PM  LOS: 2 days

## 2014-04-21 NOTE — Progress Notes (Signed)
Michael Cunningham  [id number] 1945/03/22 04/21/2014      Progress Note-Follow Up  Subjective  Chief Complaint  Chief Complaint  Patient presents with  . Follow-up    3 mos    HPI  Patient is a 69 y.o. male in today for routine medical care. Patient in today for follow-up. Has several new complaints. Notes recently he's had some stiffness and swelling over his MCP joints on both hands. Denies recent illness but does note he had 2 episodes of having chest tightness with shortness of breath while shoveling snow during the recent weather. He was somewhat short of breath but denies diaphoresis or nausea. Has had no further symptoms since that time and no other acute illnesses noted. Denies CP/palp/SOB/HA/congestion/fevers/GI or GU c/o. Taking meds as prescribed Past Medical History  Diagnosis Date  . Hypertension   . Multiple lipomas     hx. mulitple and some remains -arms, legs  . Diverticulosis of colon     LEFT SIDE  . History of small bowel obstruction     X2  28 YRS AGO AND LAST ONE 06-02-2001--  RESOLVED WITHOUT SURGICAL INTERVENTION  . Type 2 diabetes mellitus   . History of kidney stones   . Left ureteral calculus   . Complication of anesthesia     11'12 Surgery with excrutiating "head about to explode" upon awakening  -lasted several hours postop  ( SURGERY DONE 08-13-2011 AT [facility] PT DID OK POST OP)  . At risk for sleep apnea     STOP-BANG =  4   SENT TO PCP 04-26-2013  . Chicken pox as a child  . Measles as a child  . Epistaxis   . Hyperlipidemia   . Recurrent kidney stones 07/08/2013    Follows with Dr Gaynelle Arabian He believes they are Calcium oxalate stones  . Obesity, unspecified 09/18/2013  . SBO (small bowel obstruction) 04/19/2014    Past Surgical History  Procedure Laterality Date  . Lipoma excision  1975    multiple, 10-12  . Knee arthroscopy  08/13/2011    right ARTHROSCOPY KNEE;  Surgeon: Gearlean Alf, MD;  Location: [facility];  Service: Orthopedics;  Laterality:  Right;  medial  meniscal debridement, excision of plica, synovectomy  . Knee arthroscopy w/ meniscectomy Right 01-29-2011    twice, first trimmed mediscus and cyst  . Colonoscopy w/ polypectomy  LAST ONE 2013  . Knee arthroscopy Left 11/16/2013    Procedure: LEFT KNEE ARTHROSCOPY MEDIAL MENISCAL DEBRIDEMENT, CHONDROPLASTY;  Surgeon: Gearlean Alf, MD;  Location: [facility];  Service: Orthopedics;  Laterality: Left;    Family History  Problem Relation Age of Onset  . Hypertension Mother   . Cancer Mother 16    ovarian  . Heart disease Father     chf  . Heart attack Father 26  . Cancer Sister     breast- just finished her last radiation  . Heart disease Maternal Grandfather     ?  [name] Heart attack Paternal Grandfather   . Diabetes Neg Hx     History   Social History  . Marital Status: Married    Spouse Name: N/A    Number of Children: N/A  . Years of Education: N/A   Occupational History  . Not on file.   Social History Main Topics  . Smoking status: Former Smoker -- 1.00 packs/day for 4 years    Types: Cigarettes    Quit date: 06/14/1967  . Smokeless tobacco: Never Used  .  Alcohol Use: No  . Drug Use: No  . Sexual Activity: Yes     Comment: lives with wife   Other Topics Concern  . Not on file   Social History Narrative    No current facility-administered medications on file prior to visit.   Current Outpatient Prescriptions on File Prior to Visit  Medication Sig Dispense Refill  . aspirin EC 81 MG tablet Take 81 mg by mouth daily with breakfast.     . diclofenac sodium (VOLTAREN) 1 % GEL Apply 2 g topically 3 (three) times daily as needed (pain).     [name] ibuprofen (ADVIL,MOTRIN) 200 MG tablet Take 400 mg by mouth every 6 (six) hours as needed for mild pain.    [name] insulin NPH Human (HUMULIN N) 100 UNIT/ML injection Inject 0.35 mLs (35 Units total) into the skin at bedtime. 20 mL 11  . insulin regular (HUMULIN R) 100 units/mL injection 3 times a day (just before each  meal), 40-30-45 units. 40 mL 11  . Insulin Syringe-Needle U-100 (RELION INSULIN SYR 0.5ML/31G) 31G X 5/16" 0.5 ML MISC USE TO INJECT INSULIN FOUR TIMES PER DAY DX code E11.9 200 each 0  . losartan (COZAAR) 50 MG tablet Take 1 tablet (50 mg total) by mouth 2 (two) times daily. (Patient taking differently: Take 75 mg by mouth daily. Pt is taking 1 1/2 pill daily.) 180 tablet 0  . Multiple Vitamin (MULTIVITAMIN WITH MINERALS) TABS tablet Take 1 tablet by mouth daily.    . simvastatin (ZOCOR) 20 MG tablet Take 1 tablet (20 mg total) by mouth every evening. 90 tablet 3    Allergies  Allergen Reactions  . Ciprofloxacin Other (See Comments)    Abdominal pain  . Lipitor [Atorvastatin Calcium] Other (See Comments)    Severe muscle pain  . Lisinopril Cough  . Metformin And Related Diarrhea    Review of Systems  Review of Systems  Constitutional: Negative for fever and malaise/fatigue.  HENT: Negative for congestion.   Eyes: Negative for discharge.  Respiratory: Negative for shortness of breath.   Cardiovascular: Positive for chest pain. Negative for palpitations and leg swelling.  Gastrointestinal: Negative for nausea, abdominal pain and diarrhea.  Genitourinary: Negative for dysuria.  Musculoskeletal: Negative for falls.  Skin: Negative for rash.  Neurological: Negative for loss of consciousness and headaches.  Endo/Heme/Allergies: Negative for polydipsia.  Psychiatric/Behavioral: Negative for depression and suicidal ideas. The patient is not nervous/anxious and does not have insomnia.     Objective  BP 150/95 mmHg  Pulse 83  Temp(Src) 97.6 F (36.4 C) (Oral)  Ht 5\' 10"  (1.778 m)  Wt 250 lb 9.6 oz (113.671 kg)  BMI 35.96 kg/m2  SpO2 96%  Physical Exam  Physical Exam  Constitutional: He is oriented to person, place, and time and well-developed, well-nourished, and in no distress. No distress.  HENT:  Head: Normocephalic and atraumatic.  Eyes: Conjunctivae are normal.  Neck:  Neck supple. No thyromegaly present.  Cardiovascular: Normal rate, regular rhythm and normal heart sounds.   No murmur heard. Pulmonary/Chest: Effort normal and breath sounds normal. No respiratory distress.  Abdominal: He exhibits no distension and no mass. There is no tenderness.  Musculoskeletal: He exhibits no edema.  Neurological: He is alert and oriented to person, place, and time.  Skin: Skin is warm.  Psychiatric: Memory, affect and judgment normal.    Lab Results  Component Value Date   TSH 1.19 04/16/2014   Lab Results  Component Value Date  WBC 6.1 04/20/2014   HGB 13.4 04/20/2014   HCT 39.0 04/20/2014   MCV 89.7 04/20/2014   PLT 143* 04/20/2014   Lab Results  Component Value Date   CREATININE 0.85 04/20/2014   BUN 14 04/20/2014   NA 138 04/20/2014   K 3.6 04/20/2014   CL 102 04/20/2014   CO2 27 04/20/2014   Lab Results  Component Value Date   ALT 24 04/20/2014   AST 20 04/20/2014   ALKPHOS 63 04/20/2014   BILITOT 0.8 04/20/2014   Lab Results  Component Value Date   CHOL 171 04/16/2014   Lab Results  Component Value Date   HDL 35.70* 04/16/2014   Lab Results  Component Value Date   LDLCALC 106* 12/26/2013   Lab Results  Component Value Date   TRIG 202.0* 04/16/2014   Lab Results  Component Value Date   CHOLHDL 5 04/16/2014     Assessment & Plan  Hypertension Improved on recheck. Adequate control, no changes to meds. Encouraged heart healthy diet such as the DASH diet and exercise as tolerated.    Diabetes mellitus type 2 in obese [id number] elevated, minimize simple carbs. Increase exercise as tolerated. Increase insulin as directed   Obesity Encouraged DASH diet, decrease po intake and increase exercise as tolerated. Needs 7-8 hours of sleep nightly. Avoid trans fats, eat small, frequent meals every 4-5 hours with lean proteins, complex carbs and healthy fats. Minimize simple carbs   Hyperlipidemia Tolerating statin, encouraged  heart healthy diet, avoid trans fats, minimize simple carbs and saturated fats. Increase exercise as tolerated   H/O exertional chest pain EKG unremarkable but referred for further consideration to cardiology due to risk factors and nature of pain.    Hand pain B/l with swelling at knuckles. Labs unremarkable, encouraged topical Salon Pas gel. Try Curcumen, fatty acids.

## 2014-04-21 NOTE — Progress Notes (Signed)
[facility] Progress Note     Subjective: Pt doing well with clear liquids.  No N/V, ambulating well.  Feels much better overall.  Wants thicker food.  Wants to go home.  Good flatus, but no BM yet.    Objective: Vital signs in last 24 hours: Temp:  [97.7 F (36.5 C)-98 F (36.7 C)] 98 F (36.7 C) (02/07 0711) Pulse Rate:  [73-87] 73 (02/07 0711) Resp:  [12-17] 14 (02/07 0711) BP: (138-150)/(76-78) 145/76 mmHg (02/07 0711) SpO2:  [95 %-97 %] 95 % (02/07 0711) Weight:  [222 lb 4.8 oz (100.835 kg)] 222 lb 4.8 oz (100.835 kg) (02/07 0709) Last BM Date: 04/18/14  Intake/Output from previous day: 02/06 0701 - 02/07 0700 In: 1340 [P.O.:550; I.V.:790] Out: 2300 [Urine:2100; Emesis/NG output:200] Intake/Output this shift: Total I/O In: -  Out: 425 [Urine:425]  PE: Gen:  Alert, NAD, pleasant Abd: Soft, ND, NT, +BS, no HSM  Lab Results:   Recent Labs  04/19/14 0519 04/20/14 0511  WBC 7.3 6.1  HGB 15.3 13.4  HCT 44.0 39.0  PLT 164 143*   BMET  Recent Labs  04/19/14 0519 04/20/14 0511  NA 138 138  K 4.0 3.6  CL 100 102  CO2 29 27  GLUCOSE 160* 159*  BUN 18 14  CREATININE 0.99 0.85  CALCIUM 8.8 8.1*   PT/INR No results for input(s): LABPROT, INR in the last 72 hours. CMP     Component Value Date/Time   NA 138 04/20/2014 0511   K 3.6 04/20/2014 0511   CL 102 04/20/2014 0511   CO2 27 04/20/2014 0511   GLUCOSE 159* 04/20/2014 0511   BUN 14 04/20/2014 0511   CREATININE 0.85 04/20/2014 0511   CREATININE 0.71 07/13/2013 0855   CALCIUM 8.1* 04/20/2014 0511   PROT 6.0 04/20/2014 0511   ALBUMIN 3.1* 04/20/2014 0511   AST 20 04/20/2014 0511   ALT 24 04/20/2014 0511   ALKPHOS 63 04/20/2014 0511   BILITOT 0.8 04/20/2014 0511   GFRNONAA 87* 04/20/2014 0511   GFRAA >90 04/20/2014 0511   Lipase     Component Value Date/Time   LIPASE 19 04/19/2014 0519       Studies/Results: Dg Abd Portable 1v  04/19/2014   CLINICAL DATA:  Partial small  bowel obstruction. Small bowel obstruction protocol. Evaluate for contrast in the colon.  EXAM: PORTABLE ABDOMEN - 1 VIEW  COMPARISON:  CT and plain films of earlier in the day.  FINDINGS: Persistent small bowel dilatation, with small bowel loop measuring 4.4 cm in the left side of the abdomen. Compare 5.1 cm on the other plain film. Contrast is seen within the small bowel, as well as throughout the imaged ascending and transverse colon. The descending colon is partially excluded. Contrast also seen within the urinary bladder. No gross free intraperitoneal air. Nasogastric tube terminates at the body of the stomach.  IMPRESSION: Contrast transit into the colon, as detailed above.  Improved small bowel obstruction pattern.   Electronically Signed   By: Abigail Miyamoto M.D.   On: 04/19/2014 21:25   Dg Abd Portable 1v  04/19/2014   CLINICAL DATA:  Nasogastric tube placement. Nausea with abdominal pain  EXAM: PORTABLE ABDOMEN - 1 VIEW  COMPARISON:  CT abdomen and pelvis obtained earlier in the day  FINDINGS: Nasogastric tube tip and side port are in the stomach. Multiple loops of dilated small bowel are consistent with the obstruction pattern seen on CT earlier in the day. No free air seen  on this supine examination.  IMPRESSION: Evidence of small bowel obstruction. Nasogastric tube tip and side port in stomach.   Electronically Signed   By: Lowella Grip M.D.   On: 04/19/2014 10:05    Anti-infectives: Anti-infectives    None       Assessment/Plan 1. Partial small bowel obstruction, unknown cause 2. DM 3. HTN 4. Chest pain with exertion 5. Heme-occult positive NG output  Plan: 1. NG tube output was heme positive. Pt improved clinically and radio-graphically from yesterday 2. Contrast in his colon, so NGT can be removed and clear liquids given.  3. Hopefully diet advanced as tolerated 4. Ambulate and IS 5. Hopefully d/c today io tomorrow if tolerates solid diet and has a BM.    LOS: 2  days    DORT, June Vacha 04/21/2014, 8:00 AM Pager: [phone]

## 2014-04-21 NOTE — Assessment & Plan Note (Signed)
B/l with swelling at knuckles. Labs unremarkable, encouraged topical Salon Pas gel. Try Curcumen, fatty acids.

## 2014-04-21 NOTE — Assessment & Plan Note (Signed)
hgba1c elevated, minimize simple carbs. Increase exercise as tolerated. Increase insulin as directed

## 2014-04-21 NOTE — Assessment & Plan Note (Signed)
EKG unremarkable but referred for further consideration to cardiology due to risk factors and nature of pain.

## 2014-04-21 NOTE — Assessment & Plan Note (Signed)
Encouraged DASH diet, decrease po intake and increase exercise as tolerated. Needs 7-8 hours of sleep nightly. Avoid trans fats, eat small, frequent meals every 4-5 hours with lean proteins, complex carbs and healthy fats. Minimize simple carbs 

## 2014-04-22 ENCOUNTER — Telehealth: Payer: Self-pay

## 2014-04-22 LAB — GLUCOSE, CAPILLARY
GLUCOSE-CAPILLARY: 167 mg/dL — AB (ref 70–99)
GLUCOSE-CAPILLARY: 178 mg/dL — AB (ref 70–99)
GLUCOSE-CAPILLARY: 183 mg/dL — AB (ref 70–99)
Glucose-Capillary: 173 mg/dL — ABNORMAL HIGH (ref 70–99)

## 2014-04-22 MED ORDER — PANTOPRAZOLE SODIUM 40 MG PO TBEC: 40.0000 mg | DELAYED_RELEASE_TABLET | Freq: Every day | ORAL | Status: DC

## 2014-04-22 NOTE — Progress Notes (Signed)
[facility] Progress Note     Subjective: Pt doing well, has complaints about not going home yesterday since he already had a BM.  He says hospitalist wouldn't let him go home after his BM at 5pm.  No N/V, tolerating solid diet well.    Objective: Vital signs in last 24 hours: Temp:  [97.6 F (36.4 C)-97.8 F (36.6 C)] 97.6 F (36.4 C) (02/08 0359) Pulse Rate:  [72-78] 72 (02/08 0359) Resp:  [12-18] 12 (02/08 0359) BP: (141-165)/(72-77) 141/72 mmHg (02/08 0620) SpO2:  [95 %-97 %] 95 % (02/08 0359) Weight:  [231 lb 4.8 oz (104.917 kg)] 231 lb 4.8 oz (104.917 kg) (02/08 0615) Last BM Date: 04/18/14  Intake/Output from previous day: 02/07 0701 - 02/08 0700 In: 340 [P.O.:340] Out: 3125 [Urine:3125] Intake/Output this shift:    PE: Gen:  Alert, NAD, pleasant Abd: Obese, soft, NT, ND, +BS, no HSM, no abdominal scars noted   Lab Results:   Recent Labs  04/20/14 0511  WBC 6.1  HGB 13.4  HCT 39.0  PLT 143*   BMET  Recent Labs  04/20/14 0511  NA 138  K 3.6  CL 102  CO2 27  GLUCOSE 159*  BUN 14  CREATININE 0.85  CALCIUM 8.1*   PT/INR No results for input(s): LABPROT, INR in the last 72 hours. CMP     Component Value Date/Time   NA 138 04/20/2014 0511   K 3.6 04/20/2014 0511   CL 102 04/20/2014 0511   CO2 27 04/20/2014 0511   GLUCOSE 159* 04/20/2014 0511   BUN 14 04/20/2014 0511   CREATININE 0.85 04/20/2014 0511   CREATININE 0.71 07/13/2013 0855   CALCIUM 8.1* 04/20/2014 0511   PROT 6.0 04/20/2014 0511   ALBUMIN 3.1* 04/20/2014 0511   AST 20 04/20/2014 0511   ALT 24 04/20/2014 0511   ALKPHOS 63 04/20/2014 0511   BILITOT 0.8 04/20/2014 0511   GFRNONAA 87* 04/20/2014 0511   GFRAA >90 04/20/2014 0511   Lipase     Component Value Date/Time   LIPASE 19 04/19/2014 0519       Studies/Results: No results found.  Anti-infectives: Anti-infectives    None       Assessment/Plan 1. Partial small bowel obstruction, unknown  cause 2. DM 3. HTN 4. Chest pain with exertion 5. Heme-occult positive NG output  Plan: 1. Tolerating soft diet, had BM yesterday, wants to go home 2. SCD's and DVT per primary 3.  Ambulate and IS 4. D/c today from our perspective.  He can follow up with [place] in the office as needed to discuss elective surgical intervention for diagnostic lap at some point it may be indicated since he has never had surgery and he keeps getting recurrent SBO's.  He plans on discussing with Dr. Manya Silvas.  Recommend good bowel regimen and soft diet for 1-2 weeks.     LOS: 3 days    DORT, Jinny Blossom 04/22/2014, 8:09 AM Pager: [phone]

## 2014-04-22 NOTE — Telephone Encounter (Signed)
Admit date: 04/19/2014  Discharge date: 04/22/2014  Reason admission:  SBO  Transition Care Management Follow-up Telephone Call  How have you been since you were released from the hospital?  Pt states he is doing better.  Denies n/v and abdominal pain.  He has been able to eat without difficulty since discharge.      Do you understand why you were in the hospital? yes   Do you understand the discharge instructions? yes  Items Reviewed:  Medications reviewed: yes, pt is not taking protonix at this time.  Would like to hold off on taking medication until he discusses this med with his providers.    Allergies reviewed: yes  Dietary changes reviewed: yes, heart healthy carb modified  Referrals reviewed: yes, he has an appointment with Cardiology on 12/19.     Functional Questionnaire:   Activities of Daily Living (ADLs):   He states they are independent in the following: ambulation, bathing and hygiene, feeding, continence, grooming, toileting and dressing States they require assistance with the following: none   Any transportation issues/concerns?: no   Any patient concerns? no   Confirmed importance and date/time of follow-up visits scheduled: yes   Confirmed with patient if condition begins to worsen call PCP or go to the ER: yes  Hospital follow up appointment scheduled for 04/26/14 @ 1:30 pm with Dr. Charlett Blake.

## 2014-04-22 NOTE — Telephone Encounter (Signed)
Admit date: 04/19/2014 Discharge date: 04/22/2014  Reason for admission:  SBO  Left a message for call back.

## 2014-04-22 NOTE — Progress Notes (Signed)
Pt. Received discharge instructions and prescriptions. Educated pt. On follow-up appointments. Removed IV. No skin issues noted. All questions answered. No further needs noted at this time.

## 2014-04-22 NOTE — Discharge Summary (Signed)
Physician Discharge Summary  Tauno Falotico Benda XLK:[id number] DOB: [birth year] DOA: 04/19/2014  PCP: Penni Homans, MD  Admit date: 04/19/2014 Discharge date: 04/22/2014  Time spent: 40 minutes  Recommendations for Outpatient Follow-up:  1. Follow-up with primary care physician within one week. 2. Follow-up with Dr. Ula Lingo on 05/03/2014  Discharge Diagnoses:  Active Problems:   Partial small bowel obstruction   Hypertension   Hyperlipidemia   Diabetes mellitus type 2 in obese   H/O exertional chest pain   Small bowel obstruction   Discharge Condition: Stable  Diet recommendation: Heart healthy/carbohydrate modified diet  Filed Weights   04/20/14 0500 04/21/14 0709 04/22/14 0615  Weight: 110.678 kg (244 lb) 100.835 kg (222 lb 4.8 oz) 104.917 kg (231 lb 4.8 oz)    History of present illness:  Michael Cunningham is a 69 y.o. male, with past medical history of hypertension and diabetes and dyslipidemia. Also history of small bowel obstructions the initial episode 30 years prior and the most recent 4 years prior. He presented to the ER with complaints of abdominal discomfort. He reported about 36 hours previously had multiple episodes of emesis that eventually resolved and yesterday evening developed generalized abdominal pain. His last bowel movement was on the a.m. of 04/18/14. Upon presentation to the ER CT the abdomen and pelvis was done and revealed a partial small bowel obstruction noted with dilated bowel loops towards the right mid abdomen but no definitive transition point seen. General surgery has artery seen the patient in consultation and an NG tube has been placed. Since placement of gastric tube patient reports a small amount of flatus.  Prior to this admission the patient has been having issues with swelling and discomfort in both hands primarily at the PIP joints. He also had noted some exertional chest pain recently while shoveling snow. Both of these issues were discussed with the  patient's primary care physician and he has been scheduled for an outpatient cardiology evaluation. The ER physician has ordered an EKG which has not been completed at time of admission.  Hospital Course:    Partial small bowel obstruction Patient was given Gastrografin for small bowel follow through in the ED. Contrast was seen in the colon, patient is passing gas, NG tube discontinued. Continue to ambulate, keep potassium above 4. Started on clear liquids then diet advanced to soft yesterday, he tolerated diet very okay. Discharge date follow-up with primary care physician.  Blood in the gastric suctioned fluid After 2-3 hours of the suction the gastric fluid started to turn brownish, which tested positive for FOBT. This is likely secondary to mucosal injury from the tube and the suction. D[facility]ed with Dr. [facility], recommended empiric PPIs. Discharged on 40 mg of Protonix at least for 1 month. Have asked him to try to avoid NSAIDs.  Diabetes mellitus type 2 Home medications restarted at the time of discharge. SSI was utilized while he is in the hospital.  Hypertension Home medications and hold, Lopressor IV when necessary.  Trace lower extremity edema Echo was done showed normal LVEF but grade 1 diastolic dysfunction. Explained to the patient and his wife (who is a Marine scientist) this is likely secondary to prolonged hypertension.  Chest pain He mentioned chest pain while shoveling snow during the recent snowstorm. Chest pain comes with exertion and relieved by rest, likely stable angina. Patient will follow-up with cardiology on the 19th, 2-D echo did not show any wall motion abnormalities. Patient is on aspirin, I will let cardiology decide  about adding beta blockers.   Procedures:  None  Consultations:  Gen. surgery  Discharge Exam: Filed Vitals:   04/22/14 1049  BP: 159/76  Pulse:   Temp:   Resp:    General: Alert and awake, oriented x3, not in any acute  distress. HEENT: anicteric sclera, pupils reactive to light and accommodation, EOMI CVS: S1-S2 clear, no murmur rubs or gallops Chest: clear to auscultation bilaterally, no wheezing, rales or rhonchi Abdomen: soft nontender, nondistended, normal bowel sounds, no organomegaly Extremities: no cyanosis, clubbing or edema noted bilaterally Neuro: Cranial nerves II-XII intact, no focal neurological deficits  Discharge Instructions   Discharge Instructions    Increase activity slowly    Complete by:  As directed           Current Discharge Medication List    START taking these medications   Details  pantoprazole (PROTONIX) 40 MG tablet Take 1 tablet (40 mg total) by mouth daily. Qty: 30 tablet, Refills: 0      CONTINUE these medications which have NOT CHANGED   Details  acetaminophen (TYLENOL) 500 MG tablet Take 1,000 mg by mouth every 6 (six) hours as needed for mild pain.    aspirin EC 81 MG tablet Take 81 mg by mouth daily with breakfast.     insulin NPH Human (HUMULIN N) 100 UNIT/ML injection Inject 0.35 mLs (35 Units total) into the skin at bedtime. Qty: 20 mL, Refills: 11    insulin regular (HUMULIN R) 100 units/mL injection 3 times a day (just before each meal), 40-30-45 units. Qty: 40 mL, Refills: 11    Insulin Syringe-Needle U-100 (RELION INSULIN SYR 0.5ML/31G) 31G X 5/16" 0.5 ML MISC USE TO INJECT INSULIN FOUR TIMES PER DAY DX code E11.9 Qty: 200 each, Refills: 0    losartan (COZAAR) 50 MG tablet Take 1 tablet (50 mg total) by mouth 2 (two) times daily. Qty: 180 tablet, Refills: 0   Associated Diagnoses: Essential hypertension    Multiple Vitamin (MULTIVITAMIN WITH MINERALS) TABS tablet Take 1 tablet by mouth daily.    simvastatin (ZOCOR) 20 MG tablet Take 1 tablet (20 mg total) by mouth every evening. Qty: 90 tablet, Refills: 3   Associated Diagnoses: Other and unspecified hyperlipidemia      STOP taking these medications     diclofenac sodium (VOLTAREN) 1 %  GEL      ibuprofen (ADVIL,MOTRIN) 200 MG tablet        Allergies  Allergen Reactions  . Ciprofloxacin Other (See Comments)    Abdominal pain  . Lipitor [Atorvastatin Calcium] Other (See Comments)    Severe muscle pain  . Lisinopril Cough  . Metformin And Related Diarrhea   Follow-up Information    Follow up with Penni Homans, MD In 1 week.   Specialty:  Family Medicine   Contact information:   [address] [place] [address] [phone]       Follow up with Minus Breeding, MD On 05/03/2014.   Specialty:  Cardiology   Contact information:   [address] [place] [address] [phone]        The results of significant diagnostics from this hospitalization (including imaging, microbiology, ancillary and laboratory) are listed below for reference.    Significant Diagnostic Studies: Ct Abdomen Pelvis W Contrast  04/19/2014   CLINICAL DATA:  Generalized abdominal pain.  Nausea and vomiting.  EXAM: CT ABDOMEN AND PELVIS WITH CONTRAST  TECHNIQUE: Multidetector CT imaging of the abdomen and pelvis  was performed using the standard protocol following bolus administration of intravenous contrast.  CONTRAST:  39mL OMNIPAQUE IOHEXOL 300 MG/ML SOLN, 123mL OMNIPAQUE IOHEXOL 300 MG/ML SOLN  COMPARISON:  04/24/2013.  06/13/2011  FINDINGS: There is mild chronic atelectasis at the right lung base with elevation of right hemidiaphragm. No consolidation to suggest pneumonia. Coronary artery calcifications are seen.  Stomach is distended with ingested contrast. There are dilated proximal small bowel loops that are fluid-filled. There is general transition from dilated to nondilated bowel loops in the right mid abdomen, no abrupt transition point is seen. This is, however at site of prior small bowel obstruction from 2013 and may be related to adhesions. Small bowel loops distal to this are decompressed. Small to moderate volume of colonic stool. No colonic wall  thickening. Multiple colonic diverticula involving the descending colon without diverticulitis. No free air, free fluid, or intra-abdominal fluid collection.  The liver demonstrates diffusely decreased density without focal hepatic lesion. Dependent gallstones in the gallbladder which is physiologically distended. No pericholecystic inflammatory change. The spleen, adrenal glands, and pancreas are normal. Kidneys demonstrate symmetric enhancement and excretion without hydronephrosis or perinephric stranding. Small fat containing umbilical hernia.  The abdominal aorta is normal in caliber with moderate atherosclerosis. No retroperitoneal adenopathy.  Within the pelvis the urinary bladder is physiologically distended. Prostate gland is normal in size. No pelvic free fluid or adenopathy.  Multilevel degenerative change throughout the spine. No acute osseous abnormality.  IMPRESSION: 1. Findings suggest partial small bowel obstruction, with general transition from dilated to nondilated in the right mid abdomen. This is at a similar site seen on CT of March 2013 and suggest adhesions. 2. Incidental findings of cholelithiasis, colonic diverticulosis, and hepatic steatosis.   Electronically Signed   By: Jeb Levering M.D.   On: 04/19/2014 07:06   Dg Abd Portable 1v  04/19/2014   CLINICAL DATA:  Partial small bowel obstruction. Small bowel obstruction protocol. Evaluate for contrast in the colon.  EXAM: PORTABLE ABDOMEN - 1 VIEW  COMPARISON:  CT and plain films of earlier in the day.  FINDINGS: Persistent small bowel dilatation, with small bowel loop measuring 4.4 cm in the left side of the abdomen. Compare 5.1 cm on the other plain film. Contrast is seen within the small bowel, as well as throughout the imaged ascending and transverse colon. The descending colon is partially excluded. Contrast also seen within the urinary bladder. No gross free intraperitoneal air. Nasogastric tube terminates at the body of the  stomach.  IMPRESSION: Contrast transit into the colon, as detailed above.  Improved small bowel obstruction pattern.   Electronically Signed   By: Abigail Miyamoto M.D.   On: 04/19/2014 21:25   Dg Abd Portable 1v  04/19/2014   CLINICAL DATA:  Nasogastric tube placement. Nausea with abdominal pain  EXAM: PORTABLE ABDOMEN - 1 VIEW  COMPARISON:  CT abdomen and pelvis obtained earlier in the day  FINDINGS: Nasogastric tube tip and side port are in the stomach. Multiple loops of dilated small bowel are consistent with the obstruction pattern seen on CT earlier in the day. No free air seen on this supine examination.  IMPRESSION: Evidence of small bowel obstruction. Nasogastric tube tip and side port in stomach.   Electronically Signed   By: Lowella Grip M.D.   On: 04/19/2014 10:05    Microbiology: No results found for this or any previous visit (from the past 240 hour(s)).   Labs: Basic Metabolic Panel:  Recent Labs Lab 04/16/14  1022 04/19/14 0519 04/20/14 0511  NA 136 138 138  K 4.3 4.0 3.6  CL 104 100 102  CO2 27 29 27   GLUCOSE 167* 160* 159*  BUN 16 18 14   CREATININE 0.76 0.99 0.85  CALCIUM 9.3 8.8 8.1*   Liver Function Tests:  Recent Labs Lab 04/16/14 1022 04/19/14 0519 04/20/14 0511  AST 26 27 20   ALT 30 31 24   ALKPHOS 79 73 63  BILITOT 0.6 1.0 0.8  PROT 6.8 6.9 6.0  ALBUMIN 4.1 3.8 3.1*    Recent Labs Lab 04/19/14 0519  LIPASE 19   No results for input(s): AMMONIA in the last 168 hours. CBC:  Recent Labs Lab 04/16/14 1022 04/19/14 0519 04/20/14 0511  WBC 5.6 7.3 6.1  NEUTROABS  --  6.0  --   HGB 14.9 15.3 13.4  HCT 43.4 44.0 39.0  MCV 90.1 89.2 89.7  PLT 189.0 164 143*   Cardiac Enzymes: No results for input(s): CKTOTAL, CKMB, CKMBINDEX, TROPONINI in the last 168 hours. BNP: BNP (last 3 results) No results for input(s): BNP in the last 8760 hours.  ProBNP (last 3 results) No results for input(s): PROBNP in the last 8760 hours.  CBG:  Recent  Labs Lab 04/21/14 1959 04/21/14 2132 04/22/14 0011 04/22/14 0356 04/22/14 0748  GLUCAP 167* 204* 178* 173* 183*       Signed:  Garek Schuneman A  [facility] 04/22/2014, 12:12 PM

## 2014-04-26 ENCOUNTER — Ambulatory Visit (INDEPENDENT_AMBULATORY_CARE_PROVIDER_SITE_OTHER): Payer: Medicare Other | Admitting: Family Medicine

## 2014-04-26 ENCOUNTER — Encounter: Payer: Self-pay | Admitting: Family Medicine

## 2014-04-26 DIAGNOSIS — E669 Obesity, unspecified: Secondary | ICD-10-CM | POA: Diagnosis not present

## 2014-04-26 DIAGNOSIS — I10 Essential (primary) hypertension: Secondary | ICD-10-CM | POA: Diagnosis not present

## 2014-04-26 DIAGNOSIS — E119 Type 2 diabetes mellitus without complications: Secondary | ICD-10-CM | POA: Diagnosis not present

## 2014-04-26 DIAGNOSIS — D696 Thrombocytopenia, unspecified: Secondary | ICD-10-CM | POA: Diagnosis not present

## 2014-04-26 DIAGNOSIS — K5669 Other intestinal obstruction: Secondary | ICD-10-CM | POA: Diagnosis not present

## 2014-04-26 DIAGNOSIS — E1169 Type 2 diabetes mellitus with other specified complication: Secondary | ICD-10-CM

## 2014-04-26 DIAGNOSIS — K56609 Unspecified intestinal obstruction, unspecified as to partial versus complete obstruction: Secondary | ICD-10-CM

## 2014-04-26 LAB — COMPREHENSIVE METABOLIC PANEL
ALT: 55 U/L — ABNORMAL HIGH (ref 0–53)
AST: 29 U/L (ref 0–37)
Albumin: 4 g/dL (ref 3.5–5.2)
Alkaline Phosphatase: 101 U/L (ref 39–117)
BILIRUBIN TOTAL: 0.5 mg/dL (ref 0.2–1.2)
BUN: 16 mg/dL (ref 6–23)
CO2: 32 meq/L (ref 19–32)
Calcium: 9.2 mg/dL (ref 8.4–10.5)
Chloride: 102 mEq/L (ref 96–112)
Creatinine, Ser: 0.96 mg/dL (ref 0.40–1.50)
GFR: 82.34 mL/min (ref >60.00)
GLUCOSE: 203 mg/dL — AB (ref 70–99)
Potassium: 4.5 mEq/L (ref 3.5–5.1)
Sodium: 137 mEq/L (ref 135–145)
Total Protein: 7.2 g/dL (ref 6.0–8.3)

## 2014-04-26 LAB — CBC
HEMATOCRIT: 43.1 % (ref 39.0–52.0)
Hemoglobin: 14.8 g/dL (ref 13.0–17.0)
MCHC: 34.4 g/dL (ref 30.0–36.0)
MCV: 90.2 fl (ref 78.0–100.0)
PLATELETS: 238 10*3/uL (ref 150.0–400.0)
RBC: 4.78 Mil/uL (ref 4.22–5.81)
RDW: 13.7 % (ref 11.5–15.5)
WBC: 6.8 10*3/uL (ref 4.0–10.5)

## 2014-04-26 LAB — VITAMIN D 25 HYDROXY (VIT D DEFICIENCY, FRACTURES): VITD: 18.42 ng/mL — ABNORMAL LOW (ref 30.00–100.00)

## 2014-04-26 NOTE — Progress Notes (Signed)
Pre visit review using our clinic review tool, if applicable. No additional management support is needed unless otherwise documented below in the visit note. 

## 2014-04-26 NOTE — Patient Instructions (Signed)
Consider a probiotic such as Digestive Advantage or [employer]    Small Bowel Obstruction A small bowel obstruction means something is blocking the small intestine. The small intestine is the long tube that connects the stomach to the colon. Treatment depends on what is causing the problem. Treatment also depends on how bad the problem is. HOME CARE Your doctor may let you go home if your small bowel is not completely blocked.  Rest.  Follow your diet as told by your doctor.  Only drink clear liquids until you start to get better.  Avoid solid foods as told by your doctor.  Only take medicine as told by your doctor. GET HELP RIGHT AWAY IF:  You have pain or cramps that get worse.  You throw up (vomit) blood.  You feel sick to your stomach (nauseous), or you cannot stop throwing up.  You cannot drink fluids.  You feel confused.  You feel dry or thirsty (dehydrated).  Your belly gets more puffy (bloated).  You have chills.  You have a fever.  You feel weak, or you pass out (faint). MAKE SURE YOU:  Understand these instructions.  Will watch your condition.  Will get help right away if you are not doing well or get worse. Document Released: 04/08/2004 Document Revised: 05/24/2011 Document Reviewed: 06/09/2010 [facility] Patient Information 2015 [place], [place]. This information is not intended to replace advice given to you by your health care provider. Make sure you discuss any questions you have with your health care provider.

## 2014-04-29 ENCOUNTER — Other Ambulatory Visit: Payer: Self-pay | Admitting: Family Medicine

## 2014-04-29 MED ORDER — VITAMIN D (ERGOCALCIFEROL) 1.25 MG (50000 UNIT) PO CAPS
50000.0000 [IU] | ORAL_CAPSULE | ORAL | Status: DC
Start: 2014-04-29 — End: 2015-03-25

## 2014-05-03 ENCOUNTER — Encounter: Payer: Self-pay | Admitting: Cardiology

## 2014-05-03 ENCOUNTER — Ambulatory Visit (INDEPENDENT_AMBULATORY_CARE_PROVIDER_SITE_OTHER): Payer: Medicare Other | Admitting: Cardiology

## 2014-05-03 VITALS — BP 160/80 | HR 80 | Ht 70.0 in | Wt 248.0 lb

## 2014-05-03 DIAGNOSIS — R072 Precordial pain: Secondary | ICD-10-CM | POA: Diagnosis not present

## 2014-05-03 DIAGNOSIS — R0789 Other chest pain: Secondary | ICD-10-CM

## 2014-05-03 MED ORDER — NITROGLYCERIN 0.4 MG SL SUBL: 0.4000 mg | SUBLINGUAL_TABLET | SUBLINGUAL | Status: DC | PRN

## 2014-05-03 NOTE — Patient Instructions (Addendum)
Your physician recommends that you schedule a follow-up appointment in: Three months with Dr. Percival Spanish  We are ordering a stress test for you to get done  Use the NTG we are ordering for you today as directed

## 2014-05-03 NOTE — Progress Notes (Addendum)
Cardiology Office Note   Date:  05/03/2014   ID:  Lanier Ensign Clegg, DOB 1945-11-18, MRN [id number]  PCP:  Penni Homans, MD  Cardiologist:   Minus Breeding, MD   CHEST PAIN.    History of Present Illness: Michael Cunningham is a 69 y.o. male who presents for evaluation of chest pain.  The patient has no past cardiac history although he does have significant cardiovascular risk factors. When we recently had some snow he started shoveling and developed some chest discomfort. This was mid sternal tightness. There was no jaw or arm discomfort. There was no radiation. It was 4 out of 10 in intensity. He stopped what he was doing it went away in less than a minute. However, the next day he tried this again and it returned. He was to see [place] but prior to that was in the hospital with a small bowel obstruction. I do note that he had an echocardiogram which demonstrated some mild diastolic dysfunction and aortic valve thickening but no other significant abnormalities and had a preserved ejection fraction. There were no other cardiac issues at that time.  Since then he's not done anything exerting and has had no further discomfort. He denies any shortness of breath, PND or orthopnea. He has no palpitations, presyncope or syncope. He's had no weight gain or edema. He is limited by bilateral knee pain.    Past Medical History  Diagnosis Date  . Hypertension   . Multiple lipomas     hx. mulitple and some remains -arms, legs  . Diverticulosis of colon     LEFT SIDE  . History of small bowel obstruction     X2  28 YRS AGO AND LAST ONE 06-02-2001--  RESOLVED WITHOUT SURGICAL INTERVENTION  . Type 2 diabetes mellitus   . History of kidney stones   . Left ureteral calculus   . Complication of anesthesia     11'12 Surgery with excrutiating "head about to explode" upon awakening  -lasted several hours postop  ( SURGERY DONE 08-13-2011 AT [facility] PT DID OK POST OP)  . At risk for sleep apnea     STOP-BANG =  4    SENT TO PCP 04-26-2013  . Chicken pox as a child  . Measles as a child  . Epistaxis   . Hyperlipidemia   . Recurrent kidney stones 07/08/2013    Follows with Dr Gaynelle Arabian He believes they are Calcium oxalate stones  . Obesity, unspecified 09/18/2013  . SBO (small bowel obstruction) 04/19/2014  . Hand pain 04/21/2014    Past Surgical History  Procedure Laterality Date  . Lipoma excision  1975    multiple, 10-12  . Knee arthroscopy  08/13/2011    right ARTHROSCOPY KNEE;  Surgeon: Gearlean Alf, MD;  Location: [facility];  Service: Orthopedics;  Laterality: Right;  medial  meniscal debridement, excision of plica, synovectomy  . Knee arthroscopy w/ meniscectomy Right 01-29-2011    twice, first trimmed mediscus and cyst  . Colonoscopy w/ polypectomy  LAST ONE 2013  . Knee arthroscopy Left 11/16/2013    Procedure: LEFT KNEE ARTHROSCOPY MEDIAL MENISCAL DEBRIDEMENT, CHONDROPLASTY;  Surgeon: Gearlean Alf, MD;  Location: [facility];  Service: Orthopedics;  Laterality: Left;     Current Outpatient Prescriptions  Medication Sig Dispense Refill  . acetaminophen (TYLENOL) 500 MG tablet Take 1,000 mg by mouth every 6 (six) hours as needed for mild pain.    [name] aspirin EC 81 MG tablet Take 81  mg by mouth daily with breakfast.     . insulin NPH Human (HUMULIN N) 100 UNIT/ML injection Inject 0.35 mLs (35 Units total) into the skin at bedtime. 20 mL 11  . insulin regular (HUMULIN R) 100 units/mL injection 3 times a day (just before each meal), 40-30-45 units. 40 mL 11  . Insulin Syringe-Needle U-100 (RELION INSULIN SYR 0.5ML/31G) 31G X 5/16" 0.5 ML MISC USE TO INJECT INSULIN FOUR TIMES PER DAY DX code E11.9 200 each 0  . losartan (COZAAR) 50 MG tablet Take 1 tablet (50 mg total) by mouth 2 (two) times daily. (Patient taking differently: Take 75 mg by mouth daily. Pt is taking 1 1/2 pill daily.) 180 tablet 0  . Multiple Vitamin (MULTIVITAMIN WITH MINERALS) TABS tablet Take 1 tablet by mouth daily.    .  simvastatin (ZOCOR) 20 MG tablet Take 1 tablet (20 mg total) by mouth every evening. 90 tablet 3  . Vitamin D, Ergocalciferol, (DRISDOL) 50000 UNITS CAPS capsule Take 1 capsule (50,000 Units total) by mouth every 7 (seven) days. For 12 weeks 4 capsule 3  . pantoprazole (PROTONIX) 40 MG tablet Take 1 tablet (40 mg total) by mouth daily. (Patient not taking: Reported on 05/03/2014) 30 tablet 0   No current facility-administered medications for this visit.    Allergies:   Ciprofloxacin; Lipitor; Lisinopril; and Metformin and related    Social History:  The patient  reports that he quit smoking about 46 years ago. His smoking use included Cigarettes. He has a 4 pack-year smoking history. He has never used smokeless tobacco. He reports that he does not drink alcohol or use illicit drugs.   Family History:  The patient's family history includes Cancer in his sister; Cancer (age of onset: 15) in his mother; Heart attack in his paternal grandfather; Heart attack (age of onset: 52) in his father; Heart disease in his father and maternal grandfather; Hypertension in his mother. There is no history of Diabetes.    ROS:  Please see the history of present illness.   Otherwise, review of systems are positive for none.   All other systems are reviewed and negative.    PHYSICAL EXAM: VS:  BP 160/80 mmHg  Pulse 80  Ht 5\' 10"  (1.778 m)  Wt 248 lb (112.492 kg)  BMI 35.58 kg/m2 , BMI Body mass index is 35.58 kg/(m^2). GENERAL:  Well appearing HEENT:  Pupils equal round and reactive, fundi not visualized, oral mucosa unremarkable NECK:  No jugular venous distention, waveform within normal limits, carotid upstroke brisk and symmetric, no bruits, no thyromegaly LYMPHATICS:  No cervical, inguinal adenopathy LUNGS:  Clear to auscultation bilaterally BACK:  No CVA tenderness CHEST:  Unremarkable HEART:  PMI not displaced or sustained,S1 and S2 within normal limits, no S3, no S4, no clicks, no rubs, no  murmurs ABD:  Flat, positive bowel sounds normal in frequency in pitch, no bruits, no rebound, no guarding, no midline pulsatile mass, no hepatomegaly, no splenomegaly EXT:  2 plus pulses throughout, no edema, no cyanosis no clubbing SKIN:  No rashes no nodules NEURO:  Cranial nerves II through XII grossly intact, motor grossly intact throughout PSYCH:  Cognitively intact, oriented to person place and time    EKG:  EKG is not ordered today. The ekg ordered 2/5/26demonstrates sinus rhythm, rate 90, incomplete right bundle branch block, no acute ST-T wave changes.   Recent Labs: 04/16/2014: TSH 1.19 04/26/2014: ALT 55*; BUN 16; Creatinine 0.96; Hemoglobin 14.8; Platelets 238.0; Potassium 4.5; Sodium  137    Lipid Panel    Component Value Date/Time   CHOL 171 04/16/2014 1022   TRIG 202.0* 04/16/2014 1022   HDL 35.70* 04/16/2014 1022   CHOLHDL 5 04/16/2014 1022   VLDL 40.4* 04/16/2014 1022   LDLCALC 106* 12/26/2013 0838   LDLDIRECT 108.0 04/16/2014 1022      Wt Readings from Last 3 Encounters:  05/03/14 248 lb (112.492 kg)  04/26/14 247 lb 4 oz (112.152 kg)  04/16/14 250 lb 9.6 oz (113.671 kg)      Other studies Reviewed: Additional studies/ records that were reviewed today include: Hospital records. Review of the above records demonstrates:  Please see elsewhere in the note.  See above   ASSESSMENT AND PLAN:  CHEST PAIN:  This sounds like exertional angina. He needs stress testing. He would not be a walk on a treadmill. Therefore, he will have a [employer].  We did discuss the fact that if he has a negative test but his pain worsens he would need to still come back and see me.  HTN:  His blood pressure is slightly elevated but his meds were recently changed. We talked about therapeutic lifestyle changes.  OVERWEIGHT:  The patient understands the need to lose weight with diet and exercise. We have discussed specific strategies for this.   Current medicines are  reviewed at length with the patient today.  The patient does not have concerns regarding medicines.  The following changes have been made:  no change  Labs/ tests ordered today include: Lexiscan Myoview. Orders Placed This Encounter  Procedures  . EKG 12-Lead   Lab Results  Component Value Date   HGBA1C 7.9* 03/21/2014     Disposition:   FU with me in 3 months.      Signed, Minus Breeding, MD  05/03/2014 12:42 PM    [facility]

## 2014-05-07 NOTE — Assessment & Plan Note (Signed)
Had a similar episode 30 years ago. Symptoms have now resolved. Encouraged small frequent meals with adequate hydration. Report a change in symptoms

## 2014-05-07 NOTE — Assessment & Plan Note (Signed)
hgba1c acceptable, minimize simple carbs. Increase exercise as tolerated. Continue current meds 

## 2014-05-07 NOTE — Assessment & Plan Note (Signed)
Well controlled, no changes to meds. Encouraged heart healthy diet such as the DASH diet and exercise as tolerated.  °

## 2014-05-07 NOTE — Progress Notes (Signed)
Michael Cunningham  [id number] July 07, 1945 05/07/2014      Progress Note-Follow Up  Subjective  Chief Complaint  Chief Complaint  Patient presents with  . Hospitalization Follow-up    HPI  Patient is a 69 y.o. male in today for routine medical care. Patient hospitalized for small bowel obstruction but fortunately responded to conservative management with decompression. He had a similar episode 30 years ago which resolved with treatment. He's had no other recent illness. His bowels are just now moving informed no bloody or tarry stool. Denies CP/palp/SOB/HA/congestion/fevers/GI or GU c/o. Taking meds as prescribed  Past Medical History  Diagnosis Date  . Hypertension   . Multiple lipomas     hx. mulitple and some remains -arms, legs  . Diverticulosis of colon     LEFT SIDE  . History of small bowel obstruction     X2  28 YRS AGO AND LAST ONE 04/2014--  RESOLVED WITHOUT SURGICAL INTERVENTION  . Type 2 diabetes mellitus   . Left ureteral calculus   . Complication of anesthesia     11'12 Surgery with excrutiating "head about to explode" upon awakening  -lasted several hours postop  ( SURGERY DONE 08-13-2011 AT [facility] PT DID OK POST OP)  . At risk for sleep apnea     STOP-BANG =  4   SENT TO PCP 04-26-2013  . Chicken pox as a child  . Measles as a child  . Epistaxis   . Hyperlipidemia   . Recurrent kidney stones 07/08/2013    Follows with Dr Gaynelle Arabian He believes they are Calcium oxalate stones  . Obesity, unspecified 09/18/2013  . Hand pain 04/21/2014    Past Surgical History  Procedure Laterality Date  . Lipoma excision  1975    multiple, 10-12  . Knee arthroscopy  08/13/2011    right ARTHROSCOPY KNEE;  Surgeon: Gearlean Alf, MD;  Location: [facility];  Service: Orthopedics;  Laterality: Right;  medial  meniscal debridement, excision of plica, synovectomy  . Knee arthroscopy w/ meniscectomy Right 01-29-2011    twice, first trimmed mediscus and cyst  . Colonoscopy w/ polypectomy   LAST ONE 2013  . Knee arthroscopy Left 11/16/2013    Procedure: LEFT KNEE ARTHROSCOPY MEDIAL MENISCAL DEBRIDEMENT, CHONDROPLASTY;  Surgeon: Gearlean Alf, MD;  Location: [facility];  Service: Orthopedics;  Laterality: Left;    Family History  Problem Relation Age of Onset  . Hypertension Mother   . Cancer Mother 24    ovarian  . Heart disease Father     CHF  . Heart attack Father 73  . Cancer Sister     breast- just finished her last radiation  . Heart disease Maternal Grandfather     ?  [name] Heart attack Paternal Grandfather 59  . Diabetes Neg Hx   . Heart attack Paternal Uncle 83    Sudden death    History   Social History  . Marital Status: Married    Spouse Name: N/A  . Number of Children: 2  . Years of Education: N/A   Occupational History  . Not on file.   Social History Main Topics  . Smoking status: Former Smoker -- 1.00 packs/day for 4 years    Types: Cigarettes    Quit date: 06/14/1967  . Smokeless tobacco: Never Used  . Alcohol Use: No  . Drug Use: No  . Sexual Activity: Yes     Comment: lives with wife   Other Topics Concern  . Not  on file   Social History Narrative   Retired Systems analyst improvement.  Lives with wife.      Current Outpatient Prescriptions on File Prior to Visit  Medication Sig Dispense Refill  . acetaminophen (TYLENOL) 500 MG tablet Take 1,000 mg by mouth every 6 (six) hours as needed for mild pain.    [name] aspirin EC 81 MG tablet Take 81 mg by mouth daily with breakfast.     . insulin NPH Human (HUMULIN N) 100 UNIT/ML injection Inject 0.35 mLs (35 Units total) into the skin at bedtime. 20 mL 11  . insulin regular (HUMULIN R) 100 units/mL injection 3 times a day (just before each meal), 40-30-45 units. 40 mL 11  . Insulin Syringe-Needle U-100 (RELION INSULIN SYR 0.5ML/31G) 31G X 5/16" 0.5 ML MISC USE TO INJECT INSULIN FOUR TIMES PER DAY DX code E11.9 200 each 0  . losartan (COZAAR) 50 MG tablet Take 1 tablet (50 mg total) by mouth 2 (two)  times daily. (Patient taking differently: Take 75 mg by mouth daily. Pt is taking 1 1/2 pill daily.) 180 tablet 0  . Multiple Vitamin (MULTIVITAMIN WITH MINERALS) TABS tablet Take 1 tablet by mouth daily.    . pantoprazole (PROTONIX) 40 MG tablet Take 1 tablet (40 mg total) by mouth daily. (Patient not taking: Reported on 05/03/2014) 30 tablet 0  . simvastatin (ZOCOR) 20 MG tablet Take 1 tablet (20 mg total) by mouth every evening. 90 tablet 3   No current facility-administered medications on file prior to visit.    Allergies  Allergen Reactions  . Ciprofloxacin Other (See Comments)    Abdominal pain  . Lipitor [Atorvastatin Calcium] Other (See Comments)    Severe muscle pain  . Lisinopril Cough  . Metformin And Related Diarrhea    Review of Systems  Review of Systems  Constitutional: Negative for fever and malaise/fatigue.  HENT: Negative for congestion.   Eyes: Negative for discharge.  Respiratory: Negative for shortness of breath.   Cardiovascular: Negative for chest pain, palpitations and leg swelling.  Gastrointestinal: Negative for nausea, abdominal pain and diarrhea.  Genitourinary: Negative for dysuria.  Musculoskeletal: Negative for falls.  Skin: Negative for rash.  Neurological: Negative for loss of consciousness and headaches.  Endo/Heme/Allergies: Negative for polydipsia.  Psychiatric/Behavioral: Negative for depression and suicidal ideas. The patient is not nervous/anxious and does not have insomnia.     Objective  BP 120/60 mmHg  Pulse 97  Temp(Src) 97.8 F (36.6 C) (Oral)  Ht 5\' 10"  (1.778 m)  Wt 247 lb 4 oz (112.152 kg)  BMI 35.48 kg/m2  SpO2 96%  Physical Exam  Physical Exam  Lab Results  Component Value Date   TSH 1.19 04/16/2014   Lab Results  Component Value Date   WBC 6.8 04/26/2014   HGB 14.8 04/26/2014   HCT 43.1 04/26/2014   MCV 90.2 04/26/2014   PLT 238.0 04/26/2014   Lab Results  Component Value Date   CREATININE 0.96 04/26/2014    BUN 16 04/26/2014   NA 137 04/26/2014   K 4.5 04/26/2014   CL 102 04/26/2014   CO2 32 04/26/2014   Lab Results  Component Value Date   ALT 55* 04/26/2014   AST 29 04/26/2014   ALKPHOS 101 04/26/2014   BILITOT 0.5 04/26/2014   Lab Results  Component Value Date   CHOL 171 04/16/2014   Lab Results  Component Value Date   HDL 35.70* 04/16/2014   Lab Results  Component Value Date  [place] 106* 12/26/2013   Lab Results  Component Value Date   TRIG 202.0* 04/16/2014   Lab Results  Component Value Date   CHOLHDL 5 04/16/2014     Assessment & Plan  Hypertension Well controlled, no changes to meds. Encouraged heart healthy diet such as the DASH diet and exercise as tolerated.    Diabetes mellitus type 2 in obese hgba1c acceptable, minimize simple carbs. Increase exercise as tolerated. Continue current meds   Small bowel obstruction Had a similar episode 30 years ago. Symptoms have now resolved. Encouraged small frequent meals with adequate hydration. Report a change in symptoms

## 2014-05-14 ENCOUNTER — Telehealth (HOSPITAL_COMMUNITY): Payer: Self-pay

## 2014-05-14 DIAGNOSIS — L570 Actinic keratosis: Secondary | ICD-10-CM | POA: Diagnosis not present

## 2014-05-14 DIAGNOSIS — I8310 Varicose veins of unspecified lower extremity with inflammation: Secondary | ICD-10-CM | POA: Diagnosis not present

## 2014-05-14 DIAGNOSIS — L80 Vitiligo: Secondary | ICD-10-CM | POA: Diagnosis not present

## 2014-05-14 NOTE — Telephone Encounter (Signed)
Encounter complete. 

## 2014-05-15 ENCOUNTER — Telehealth (HOSPITAL_COMMUNITY): Payer: Self-pay

## 2014-05-15 NOTE — Telephone Encounter (Signed)
Encounter complete. 

## 2014-05-16 ENCOUNTER — Ambulatory Visit (HOSPITAL_BASED_OUTPATIENT_CLINIC_OR_DEPARTMENT_OTHER)
Admission: RE | Admit: 2014-05-16 | Discharge: 2014-05-16 | Disposition: A | Discharge status: Home / Self Care | Payer: Medicare Other | Source: Ambulatory Visit | Attending: Interventional Cardiology | Admitting: Interventional Cardiology

## 2014-05-16 DIAGNOSIS — Z87442 Personal history of urinary calculi: Secondary | ICD-10-CM | POA: Diagnosis not present

## 2014-05-16 DIAGNOSIS — Z7982 Long term (current) use of aspirin: Secondary | ICD-10-CM | POA: Diagnosis not present

## 2014-05-16 DIAGNOSIS — R0789 Other chest pain: Secondary | ICD-10-CM | POA: Diagnosis not present

## 2014-05-16 DIAGNOSIS — E119 Type 2 diabetes mellitus without complications: Secondary | ICD-10-CM

## 2014-05-16 DIAGNOSIS — Z87891 Personal history of nicotine dependence: Secondary | ICD-10-CM | POA: Insufficient documentation

## 2014-05-16 DIAGNOSIS — E785 Hyperlipidemia, unspecified: Secondary | ICD-10-CM

## 2014-05-16 DIAGNOSIS — R079 Chest pain, unspecified: Secondary | ICD-10-CM | POA: Insufficient documentation

## 2014-05-16 DIAGNOSIS — I10 Essential (primary) hypertension: Secondary | ICD-10-CM | POA: Insufficient documentation

## 2014-05-16 DIAGNOSIS — Z794 Long term (current) use of insulin: Secondary | ICD-10-CM

## 2014-05-16 DIAGNOSIS — Z79899 Other long term (current) drug therapy: Secondary | ICD-10-CM | POA: Diagnosis not present

## 2014-05-16 DIAGNOSIS — E669 Obesity, unspecified: Secondary | ICD-10-CM

## 2014-05-16 DIAGNOSIS — K5790 Diverticulosis of intestine, part unspecified, without perforation or abscess without bleeding: Secondary | ICD-10-CM | POA: Diagnosis not present

## 2014-05-16 DIAGNOSIS — I25110 Atherosclerotic heart disease of native coronary artery with unstable angina pectoris: Secondary | ICD-10-CM | POA: Diagnosis not present

## 2014-05-16 DIAGNOSIS — Z6834 Body mass index (BMI) 34.0-34.9, adult: Secondary | ICD-10-CM | POA: Diagnosis not present

## 2014-05-16 DIAGNOSIS — Z8249 Family history of ischemic heart disease and other diseases of the circulatory system: Secondary | ICD-10-CM | POA: Insufficient documentation

## 2014-05-16 DIAGNOSIS — I200 Unstable angina: Secondary | ICD-10-CM | POA: Diagnosis present

## 2014-05-16 MED ORDER — REGADENOSON 0.4 MG/5ML IV SOLN
0.4000 mg | Freq: Once | INTRAVENOUS | Status: AC
  Administered 2014-05-16: 0.4 mg via INTRAVENOUS

## 2014-05-16 MED ORDER — AMINOPHYLLINE 25 MG/ML IV SOLN
100.0000 mg | Freq: Once | INTRAVENOUS | Status: AC
  Administered 2014-05-16: 100 mg via INTRAVENOUS

## 2014-05-16 MED ORDER — TECHNETIUM TC 99M SESTAMIBI GENERIC - CARDIOLITE
10.9000 | Freq: Once | INTRAVENOUS | Status: AC | PRN
  Administered 2014-05-16: 10.9 via INTRAVENOUS

## 2014-05-16 MED ORDER — TECHNETIUM TC 99M SESTAMIBI GENERIC - CARDIOLITE
31.9000 | Freq: Once | INTRAVENOUS | Status: AC | PRN
  Administered 2014-05-16: 31.9 via INTRAVENOUS

## 2014-05-16 NOTE — Procedures (Addendum)
[name]  Cardiology Nuclear Med Study  Michael Cunningham is a 69 y.o. male     MRN : [id number]     DOB: Jan 04, 1946  Procedure Date: 05/16/2014  Nuclear Med Background Indication for Stress Test:  Evaluation for Ischemia and [facility] History:  No prior cardiac or respiratory history reported;No prior NUC MPI for comparison; Cardiac Risk Factors: Family History - CAD, History of Smoking, Hypertension, IDDM Type 2, Lipids and Obesity  Symptoms:  Chest Pain   Nuclear Pre-Procedure Caffeine/Decaff Intake:  12:00am NPO After: 9:00am   IV Site: R Forearm  IV 0.9% NS with Angio Cath:  22g  Chest Size (in):  44" IV Started by: Michael Course, RN  Height: 5\' 10"  (1.778 m)  Cup Size: n/a  BMI:  Body mass index is 35.58 kg/(m^2). Weight:  248 lb (112.492 kg)   Tech Comments:  n/a    Nuclear Med Study 1 or 2 day study: 1 day  Stress Test Type:  [place] Provider:  Minus Breeding, MD   Resting Radionuclide: Technetium 71m Sestamibi  Resting Radionuclide Dose: 10.9 mCi   Stress Radionuclide:  Technetium 69m Sestamibi  Stress Radionuclide Dose: 31.9 mCi           Stress Protocol Rest HR: 86 Stress HR: 102  Rest BP: 142/86 Stress BP: 142/86  Exercise Time (min): n/a METS: n/a          Dose of Adenosine (mg):  n/a Dose of Lexiscan: 0.4 mg  Dose of Atropine (mg): n/a Dose of Dobutamine: n/a mcg/kg/min (at max HR)  Stress Test Technologist: Michael Cunningham, CCT Nuclear Technologist: Michael Cunningham, CNMT   Rest Procedure:  Myocardial perfusion imaging was performed at rest 45 minutes following the intravenous administration of Technetium 72m Sestamibi. Stress Procedure:  The patient received IV Lexiscan 0.4 mg over 15-seconds.  Technetium 50m Sestamibi injected IV at 30-seconds.  Patient experienced shortness of breath, dizziness, drop in blood pressure and was  administered 100 mg of Aminophylline IV at 5 minutes.  There were no significant changes with Lexiscan.  Quantitative spect images were obtained after a 45 minute delay.  Transient Ischemic Dilatation (Normal <1.22):  1.57  QGS EDV:  88 ml QGS ESV:  32 ml LV Ejection Fraction: 63%    Rest ECG: NSR-RBBB  Stress ECG: No significant ST segment change suggestive of ischemia.  QPS Raw Data Images:  Acquisition technically good; normal left ventricular size. Stress Images:  There is decreased uptake in the anteroseptal wall and apex. Rest Images:  Normal homogeneous uptake in all areas of the myocardium. Subtraction (SDS):  These findings are consistent with ischemia.  Impression Exercise Capacity:  Lexiscan with no exercise. BP Response:  Normal blood pressure response. Clinical Symptoms:  There is dyspnea. ECG Impression:  No significant ST segment change suggestive of ischemia. Comparison with Prior Nuclear Study: No previous nuclear study performed  Overall Impression:  High risk stress nuclear study with a moderate size, severe intensity, reversible anteroseptal and apical defect consistent with moderate to severe ischemia.  LV Wall Motion:  NL LV Function; NL Wall Motion   Kirk Ruths, MD  05/16/2014 5:05 PM

## 2014-05-17 ENCOUNTER — Encounter (HOSPITAL_COMMUNITY)
Admission: RE | Disposition: A | Discharge status: Home / Self Care | Payer: Self-pay | Source: Ambulatory Visit | Attending: Cardiovascular Disease

## 2014-05-17 ENCOUNTER — Other Ambulatory Visit: Payer: Self-pay | Admitting: *Deleted

## 2014-05-17 ENCOUNTER — Ambulatory Visit (HOSPITAL_COMMUNITY)
Admission: RE | Admit: 2014-05-17 | Discharge: 2014-05-18 | Disposition: A | Discharge status: Home / Self Care | Payer: Medicare Other | Source: Ambulatory Visit | Attending: Cardiovascular Disease | Admitting: Cardiovascular Disease

## 2014-05-17 ENCOUNTER — Encounter (HOSPITAL_COMMUNITY): Payer: Self-pay | Admitting: General Practice

## 2014-05-17 DIAGNOSIS — Z87442 Personal history of urinary calculi: Secondary | ICD-10-CM | POA: Insufficient documentation

## 2014-05-17 DIAGNOSIS — I25110 Atherosclerotic heart disease of native coronary artery with unstable angina pectoris: Secondary | ICD-10-CM

## 2014-05-17 DIAGNOSIS — E669 Obesity, unspecified: Secondary | ICD-10-CM | POA: Diagnosis present

## 2014-05-17 DIAGNOSIS — K5790 Diverticulosis of intestine, part unspecified, without perforation or abscess without bleeding: Secondary | ICD-10-CM | POA: Diagnosis not present

## 2014-05-17 DIAGNOSIS — E119 Type 2 diabetes mellitus without complications: Secondary | ICD-10-CM | POA: Diagnosis not present

## 2014-05-17 DIAGNOSIS — Z955 Presence of coronary angioplasty implant and graft: Secondary | ICD-10-CM

## 2014-05-17 DIAGNOSIS — R531 Weakness: Secondary | ICD-10-CM

## 2014-05-17 DIAGNOSIS — I2510 Atherosclerotic heart disease of native coronary artery without angina pectoris: Secondary | ICD-10-CM | POA: Diagnosis present

## 2014-05-17 DIAGNOSIS — I10 Essential (primary) hypertension: Secondary | ICD-10-CM | POA: Diagnosis present

## 2014-05-17 DIAGNOSIS — Z79899 Other long term (current) drug therapy: Secondary | ICD-10-CM | POA: Insufficient documentation

## 2014-05-17 DIAGNOSIS — R0789 Other chest pain: Secondary | ICD-10-CM

## 2014-05-17 DIAGNOSIS — Z794 Long term (current) use of insulin: Secondary | ICD-10-CM | POA: Insufficient documentation

## 2014-05-17 DIAGNOSIS — D689 Coagulation defect, unspecified: Secondary | ICD-10-CM

## 2014-05-17 DIAGNOSIS — Z87891 Personal history of nicotine dependence: Secondary | ICD-10-CM | POA: Insufficient documentation

## 2014-05-17 DIAGNOSIS — E785 Hyperlipidemia, unspecified: Secondary | ICD-10-CM | POA: Diagnosis not present

## 2014-05-17 DIAGNOSIS — Z6834 Body mass index (BMI) 34.0-34.9, adult: Secondary | ICD-10-CM | POA: Insufficient documentation

## 2014-05-17 DIAGNOSIS — I200 Unstable angina: Secondary | ICD-10-CM | POA: Diagnosis present

## 2014-05-17 DIAGNOSIS — Z7982 Long term (current) use of aspirin: Secondary | ICD-10-CM | POA: Insufficient documentation

## 2014-05-17 HISTORY — PX: LEFT HEART CATHETERIZATION WITH CORONARY ANGIOGRAM: SHX5451

## 2014-05-17 HISTORY — PX: PERCUTANEOUS CORONARY STENT INTERVENTION (PCI-S): SHX5485

## 2014-05-17 HISTORY — PX: CORONARY STENT PLACEMENT: SHX1402

## 2014-05-17 HISTORY — DX: Atherosclerotic heart disease of native coronary artery without angina pectoris: I25.10

## 2014-05-17 LAB — CBC
HCT: 41.6 % (ref 39.0–52.0)
Hemoglobin: 14.7 g/dL (ref 13.0–17.0)
MCH: 31.7 pg (ref 26.0–34.0)
MCHC: 35.3 g/dL (ref 30.0–36.0)
MCV: 89.7 fL (ref 78.0–100.0)
PLATELETS: 156 10*3/uL (ref 150–400)
RBC: 4.64 MIL/uL (ref 4.22–5.81)
RDW: 13.4 % (ref 11.5–15.5)
WBC: 5.4 10*3/uL (ref 4.0–10.5)

## 2014-05-17 LAB — BASIC METABOLIC PANEL
Anion gap: 9 (ref 5–15)
BUN: 14 mg/dL (ref 6–23)
CO2: 27 mmol/L (ref 19–32)
CREATININE: 0.82 mg/dL (ref 0.50–1.35)
Calcium: 9.4 mg/dL (ref 8.4–10.5)
Chloride: 100 mmol/L (ref 96–112)
GFR, EST NON AFRICAN AMERICAN: 88 mL/min — AB (ref >90)
GLUCOSE: 234 mg/dL — AB (ref 70–99)
Potassium: 4.6 mmol/L (ref 3.5–5.1)
Sodium: 136 mmol/L (ref 135–145)

## 2014-05-17 LAB — POCT ACTIVATED CLOTTING TIME
ACTIVATED CLOTTING TIME: 282 s
Activated Clotting Time: 356 seconds

## 2014-05-17 LAB — PROTIME-INR
INR: 1.02 (ref 0.00–1.49)
PROTHROMBIN TIME: 13.5 s (ref 11.6–15.2)

## 2014-05-17 LAB — GLUCOSE, CAPILLARY
GLUCOSE-CAPILLARY: 170 mg/dL — AB (ref 70–99)
GLUCOSE-CAPILLARY: 242 mg/dL — AB (ref 70–99)
Glucose-Capillary: 192 mg/dL — ABNORMAL HIGH (ref 70–99)

## 2014-05-17 SURGERY — LEFT HEART CATHETERIZATION WITH CORONARY ANGIOGRAM
Anesthesia: LOCAL

## 2014-05-17 MED ORDER — CLOPIDOGREL BISULFATE 75 MG PO TABS
75.0000 mg | ORAL_TABLET | Freq: Every day | ORAL | Status: DC
  Administered 2014-05-18: 07:00:00 75 mg via ORAL
  Filled 2014-05-17: qty 1

## 2014-05-17 MED ORDER — OXYCODONE-ACETAMINOPHEN 5-325 MG PO TABS: 1.0000 | ORAL_TABLET | ORAL | Status: DC | PRN

## 2014-05-17 MED ORDER — MIDAZOLAM HCL 2 MG/2ML IJ SOLN
INTRAMUSCULAR | Status: AC
  Filled 2014-05-17: qty 2

## 2014-05-17 MED ORDER — HEPARIN SODIUM (PORCINE) 1000 UNIT/ML IJ SOLN
INTRAMUSCULAR | Status: AC
  Filled 2014-05-17: qty 1

## 2014-05-17 MED ORDER — HEPARIN (PORCINE) IN NACL 2-0.9 UNIT/ML-% IJ SOLN
INTRAMUSCULAR | Status: AC
  Filled 2014-05-17: qty 500

## 2014-05-17 MED ORDER — LIDOCAINE HCL (PF) 1 % IJ SOLN
INTRAMUSCULAR | Status: AC
  Filled 2014-05-17: qty 30

## 2014-05-17 MED ORDER — LOSARTAN POTASSIUM 50 MG PO TABS
75.0000 mg | ORAL_TABLET | Freq: Every day | ORAL | Status: DC
  Administered 2014-05-18: 75 mg via ORAL
  Filled 2014-05-17 (×2): qty 1

## 2014-05-17 MED ORDER — MORPHINE SULFATE 2 MG/ML IJ SOLN: 2.0000 mg | INTRAMUSCULAR | Status: DC | PRN

## 2014-05-17 MED ORDER — SODIUM CHLORIDE 0.9 % IV SOLN
INTRAVENOUS | Status: DC
  Administered 2014-05-17: 13:00:00 via INTRAVENOUS

## 2014-05-17 MED ORDER — ASPIRIN 81 MG PO CHEW
81.0000 mg | CHEWABLE_TABLET | Freq: Every day | ORAL | Status: DC
  Administered 2014-05-18: 81 mg via ORAL
  Filled 2014-05-17 (×3): qty 1

## 2014-05-17 MED ORDER — FENTANYL CITRATE 0.05 MG/ML IJ SOLN
INTRAMUSCULAR | Status: AC
  Filled 2014-05-17: qty 2

## 2014-05-17 MED ORDER — METOPROLOL TARTRATE 25 MG PO TABS
25.0000 mg | ORAL_TABLET | Freq: Two times a day (BID) | ORAL | Status: DC
  Administered 2014-05-17 – 2014-05-18 (×2): 25 mg via ORAL
  Filled 2014-05-17 (×2): qty 1

## 2014-05-17 MED ORDER — ONDANSETRON HCL 4 MG/2ML IJ SOLN: 4.0000 mg | Freq: Four times a day (QID) | INTRAMUSCULAR | Status: DC | PRN

## 2014-05-17 MED ORDER — NITROGLYCERIN 0.4 MG SL SUBL: 0.4000 mg | SUBLINGUAL_TABLET | SUBLINGUAL | Status: DC | PRN

## 2014-05-17 MED ORDER — ASPIRIN 81 MG PO CHEW
CHEWABLE_TABLET | ORAL | Status: AC
  Administered 2014-05-17: 81 mg via ORAL
  Filled 2014-05-17: qty 1

## 2014-05-17 MED ORDER — INSULIN NPH (HUMAN) (ISOPHANE) 100 UNIT/ML ~~LOC~~ SUSP
35.0000 [IU] | Freq: Every day | SUBCUTANEOUS | Status: DC
  Administered 2014-05-17: 22:00:00 35 [IU] via SUBCUTANEOUS
  Filled 2014-05-17: qty 10

## 2014-05-17 MED ORDER — CLOPIDOGREL BISULFATE 300 MG PO TABS
ORAL_TABLET | ORAL | Status: AC
  Filled 2014-05-17: qty 2

## 2014-05-17 MED ORDER — NITROGLYCERIN 1 MG/10 ML FOR IR/CATH LAB
INTRA_ARTERIAL | Status: AC
  Filled 2014-05-17: qty 10

## 2014-05-17 MED ORDER — SODIUM CHLORIDE 0.9 % IV SOLN: INTRAVENOUS | Status: AC

## 2014-05-17 MED ORDER — SIMVASTATIN 20 MG PO TABS
20.0000 mg | ORAL_TABLET | Freq: Every evening | ORAL | Status: DC
  Administered 2014-05-17: 20 mg via ORAL
  Filled 2014-05-17 (×2): qty 1

## 2014-05-17 MED ORDER — SODIUM CHLORIDE 0.9 % IV SOLN: 250.0000 mL | INTRAVENOUS | Status: DC | PRN

## 2014-05-17 MED ORDER — SODIUM CHLORIDE 0.9 % IJ SOLN: 3.0000 mL | INTRAMUSCULAR | Status: DC | PRN

## 2014-05-17 MED ORDER — INSULIN ASPART 100 UNIT/ML ~~LOC~~ SOLN
35.0000 [IU] | Freq: Three times a day (TID) | SUBCUTANEOUS | Status: DC
  Administered 2014-05-18: 35 [IU] via SUBCUTANEOUS

## 2014-05-17 MED ORDER — ASPIRIN 81 MG PO CHEW
81.0000 mg | CHEWABLE_TABLET | ORAL | Status: AC
  Administered 2014-05-17: 81 mg via ORAL

## 2014-05-17 MED ORDER — ACETAMINOPHEN 325 MG PO TABS: 650.0000 mg | ORAL_TABLET | ORAL | Status: DC | PRN

## 2014-05-17 MED ORDER — SODIUM CHLORIDE 0.9 % IJ SOLN: 3.0000 mL | Freq: Two times a day (BID) | INTRAMUSCULAR | Status: DC

## 2014-05-17 MED ORDER — VERAPAMIL HCL 2.5 MG/ML IV SOLN
INTRAVENOUS | Status: AC
  Filled 2014-05-17: qty 2

## 2014-05-17 NOTE — CV Procedure (Signed)
Cardiac Catheterization Operative Report  Michael Cunningham [id number] 3/4/20164:32 PM Penni Homans, MD  Procedure Performed:  1. Left Heart Catheterization 2. Selective Coronary Angiography 3. Left ventricular angiogram 4. PTCA/DES x 1 Diagonal 1 5. PTCA/DES x 4 proximal/mid/distal LAD  Operator: Lauree Chandler, MD  Arterial access site:  Right radial artery.   Indication:   69 yo male with unstable angina, high risk stress test showing anterior wall ischemia.                                     Procedure Details: The risks, benefits, complications, treatment options, and expected outcomes were discussed with the patient. The patient and/or family concurred with the proposed plan, giving informed consent. The patient was brought to the cath lab after IV hydration was begun and oral premedication was given. The patient was further sedated with Versed and Fentanyl. The right wrist was assessed with a modified Allens test which was positive. The right wrist was prepped and draped in a sterile fashion. 1% lidocaine was used for local anesthesia. Using the modified Seldinger access technique, a 5 French sheath was placed in the right radial artery. 3 mg Verapamil was given through the sheath. 5000 units IV heparin was given. Standard diagnostic catheters were used to perform selective coronary angiography. A pigtail catheter was used to perform a left ventricular angiogram. He was found to have diffuse severe stenosis in the proximal, mid and distal LAD as well as the moderate caliber diagonal branch. The LAD was not favorable for bypass as there were multiple segments of disease. I elected to proceed to PCI of the LAD and Diagonal.   PCI Note: He was given an additional 5000 units of IV heparin. ACT was over 280. He was given an additional 2000 units IV heparin. He was given Plavix 600 mg po x 1. I then engaged the LAD with a XB LAD 3.5 guiding catheter. I advanced a Cougar IC wire  down the Diagonal branch. I advanced a second Cougar IC wire down the LAD.   Lesion #1: (mid Diagonal): A 2.0 x 12 mm balloon was inflated x 2. I then deployed a 2.25 x 12 mm Promus Premier DES in the diagonal branch. The stenosis was taken from 99% down to 0%. I did not post-dilate as the stent was well sized and well expanded.   Lesion #2: (LAD-proximal/mid/distal): I then turned my attention to the LAD. A 2.0 x 20 mm balloon was inflated x 5 in the proximal, mid and distal vessel.  I then deployed a 2.25 x 32 mm Promus Premier DES in the distal vessel. I then deployed a 2.5 x 38 mm Promus Premier DES in the mid vessel, overlapping with the distal stent. I then deployed a 2.5 x 12 mm Promus Premier DES in the mid vessel overlapping with the previous stent. I then deployed a 3.0 x 24 mm Promus Premier DES in the proximal vessel extending back to the ostium and overlapping with the previous stent. I post-dilated the distal stented segment with a 2.25 x 20 mm [place] balloon x 3. I post-dilated the mid stented segment with a 2.5 x 27 mm [place] balloon x 3. I post-dilated the proximal segment with a 3.0 x 12 mm [place] balloon x 2. I post-dilated the proximal/ostial stented segment with a 3.5 x 12 mm [place] balloon x 2. The vessel had  good flow at the end of the case with no residual stenosis in the proximal, mid or distal vessel. (Proximal 80% down to 0%, mid 80% down to 0%, distal 99% down to 0%)  The sheath was removed from the right radial artery and a Terumo hemostasis band was applied at the arteriotomy site on the right wrist. There were no immediate complications. The patient was taken to the recovery area in stable condition.   Hemodynamic Findings: Central aortic pressure: 132/58 Left ventricular pressure: 136/4/17  Angiographic Findings:  Left main:  No obstructive disease.   Left Anterior Descending Artery: Moderate caliber vessel that courses to the apex. The vessel is diffusely diseased. There is an ostial  80% stenosis that extends into the proximal vessel. The mid vessel has diffuse 80% stenosis. The distal vessel has diffuse 70% stenosis with focal 99% stenosis. The diagonal branch is small to moderate in caliber with 99% mid stenosis.   Circumflex Artery: Large caliber vessel with moderate caliber obtuse marginal branch and several moderate caliber left posterolateral branches. The AV groove Circumflex has diffuse non-obstructive plaque. The obtuse marginal branch is a moderate caliber vessel with proximal and mid 40% stenosis.   Right Coronary Artery: Small non-dominant vessel with mid 40% stenosis.   Left Ventricular Angiogram: LVEF=65-70%.   Impression: 1. Unstable angina secondary to severe diffuse disease in the proximal, mid and distal LAD s/p successful PTCA/DES x 4 proximal, mid and distal LAD 2. Severe stenosis Diagonal 1 now s/p successful PTCA/DES x 1 Diagonal 1 3. Moderate disease OM1, does not appear to be flow limiting.  4. Normal LV function  Recommendations: Will continue ASA and Plavix for lifetime given diabetes and long segment of stenting in the entire LAD. Will check P2y12 in the am. If PRU is over 230, will need to change Plavix to Effient. Will start metoprolol. Continue statin.        Complications:  None. The patient tolerated the procedure well.

## 2014-05-17 NOTE — Interval H&P Note (Signed)
History and Physical Interval Note:  05/17/2014 2:50 PM  Michael Cunningham  has presented today for cardiac cath with the diagnosis of unstable angina, abnormal stress myoview.  The various methods of treatment have been discussed with the patient and family. After consideration of risks, benefits and other options for treatment, the patient has consented to  Procedure(s): LEFT HEART CATHETERIZATION WITH CORONARY ANGIOGRAM (N/A) as a surgical intervention .  The patient's history has been reviewed, patient examined, no change in status, stable for surgery.  I have reviewed the patient's chart and labs.  Questions were answered to the patient's satisfaction.    Cath Lab Visit (complete for each Cath Lab visit)  Clinical Evaluation Leading to the Procedure:   ACS: No.  Non-ACS:    Anginal Classification: CCS III  Anti-ischemic medical therapy: Minimal Therapy (1 class of medications)  Non-Invasive Test Results: High-risk stress test findings: cardiac mortality >3%/year  Prior CABG: No previous CABG        Gladine Plude

## 2014-05-17 NOTE — H&P (View-Only) (Signed)
Cardiology Office Note   Date:  05/03/2014   ID:  Lanier Ensign Bosserman, DOB May 24, 1945, MRN [id number]  PCP:  Penni Homans, MD  Cardiologist:   Minus Breeding, MD   CHEST PAIN.    History of Present Illness: TARIG ZIMMERS is a 69 y.o. male who presents for evaluation of chest pain.  The patient has no past cardiac history although he does have significant cardiovascular risk factors. When we recently had some snow he started shoveling and developed some chest discomfort. This was mid sternal tightness. There was no jaw or arm discomfort. There was no radiation. It was 4 out of 10 in intensity. He stopped what he was doing it went away in less than a minute. However, the next day he tried this again and it returned. He was to see [place] but prior to that was in the hospital with a small bowel obstruction. I do note that he had an echocardiogram which demonstrated some mild diastolic dysfunction and aortic valve thickening but no other significant abnormalities and had a preserved ejection fraction. There were no other cardiac issues at that time.  Since then he's not done anything exerting and has had no further discomfort. He denies any shortness of breath, PND or orthopnea. He has no palpitations, presyncope or syncope. He's had no weight gain or edema. He is limited by bilateral knee pain.    Past Medical History  Diagnosis Date  . Hypertension   . Multiple lipomas     hx. mulitple and some remains -arms, legs  . Diverticulosis of colon     LEFT SIDE  . History of small bowel obstruction     X2  28 YRS AGO AND LAST ONE 06-02-2001--  RESOLVED WITHOUT SURGICAL INTERVENTION  . Type 2 diabetes mellitus   . History of kidney stones   . Left ureteral calculus   . Complication of anesthesia     11'12 Surgery with excrutiating "head about to explode" upon awakening  -lasted several hours postop  ( SURGERY DONE 08-13-2011 AT [facility] PT DID OK POST OP)  . At risk for sleep apnea     STOP-BANG =  4    SENT TO PCP 04-26-2013  . Chicken pox as a child  . Measles as a child  . Epistaxis   . Hyperlipidemia   . Recurrent kidney stones 07/08/2013    Follows with Dr Gaynelle Arabian He believes they are Calcium oxalate stones  . Obesity, unspecified 09/18/2013  . SBO (small bowel obstruction) 04/19/2014  . Hand pain 04/21/2014    Past Surgical History  Procedure Laterality Date  . Lipoma excision  1975    multiple, 10-12  . Knee arthroscopy  08/13/2011    right ARTHROSCOPY KNEE;  Surgeon: Gearlean Alf, MD;  Location: [facility];  Service: Orthopedics;  Laterality: Right;  medial  meniscal debridement, excision of plica, synovectomy  . Knee arthroscopy w/ meniscectomy Right 01-29-2011    twice, first trimmed mediscus and cyst  . Colonoscopy w/ polypectomy  LAST ONE 2013  . Knee arthroscopy Left 11/16/2013    Procedure: LEFT KNEE ARTHROSCOPY MEDIAL MENISCAL DEBRIDEMENT, CHONDROPLASTY;  Surgeon: Gearlean Alf, MD;  Location: [facility];  Service: Orthopedics;  Laterality: Left;     Current Outpatient Prescriptions  Medication Sig Dispense Refill  . acetaminophen (TYLENOL) 500 MG tablet Take 1,000 mg by mouth every 6 (six) hours as needed for mild pain.    [name] aspirin EC 81 MG tablet Take 81  mg by mouth daily with breakfast.     . insulin NPH Human (HUMULIN N) 100 UNIT/ML injection Inject 0.35 mLs (35 Units total) into the skin at bedtime. 20 mL 11  . insulin regular (HUMULIN R) 100 units/mL injection 3 times a day (just before each meal), 40-30-45 units. 40 mL 11  . Insulin Syringe-Needle U-100 (RELION INSULIN SYR 0.5ML/31G) 31G X 5/16" 0.5 ML MISC USE TO INJECT INSULIN FOUR TIMES PER DAY DX code E11.9 200 each 0  . losartan (COZAAR) 50 MG tablet Take 1 tablet (50 mg total) by mouth 2 (two) times daily. (Patient taking differently: Take 75 mg by mouth daily. Pt is taking 1 1/2 pill daily.) 180 tablet 0  . Multiple Vitamin (MULTIVITAMIN WITH MINERALS) TABS tablet Take 1 tablet by mouth daily.    .  simvastatin (ZOCOR) 20 MG tablet Take 1 tablet (20 mg total) by mouth every evening. 90 tablet 3  . Vitamin D, Ergocalciferol, (DRISDOL) 50000 UNITS CAPS capsule Take 1 capsule (50,000 Units total) by mouth every 7 (seven) days. For 12 weeks 4 capsule 3  . pantoprazole (PROTONIX) 40 MG tablet Take 1 tablet (40 mg total) by mouth daily. (Patient not taking: Reported on 05/03/2014) 30 tablet 0   No current facility-administered medications for this visit.    Allergies:   Ciprofloxacin; Lipitor; Lisinopril; and Metformin and related    Social History:  The patient  reports that he quit smoking about 46 years ago. His smoking use included Cigarettes. He has a 4 pack-year smoking history. He has never used smokeless tobacco. He reports that he does not drink alcohol or use illicit drugs.   Family History:  The patient's family history includes Cancer in his sister; Cancer (age of onset: 12) in his mother; Heart attack in his paternal grandfather; Heart attack (age of onset: 47) in his father; Heart disease in his father and maternal grandfather; Hypertension in his mother. There is no history of Diabetes.    ROS:  Please see the history of present illness.   Otherwise, review of systems are positive for none.   All other systems are reviewed and negative.    PHYSICAL EXAM: VS:  BP 160/80 mmHg  Pulse 80  Ht 5\' 10"  (1.778 m)  Wt 248 lb (112.492 kg)  BMI 35.58 kg/m2 , BMI Body mass index is 35.58 kg/(m^2). GENERAL:  Well appearing HEENT:  Pupils equal round and reactive, fundi not visualized, oral mucosa unremarkable NECK:  No jugular venous distention, waveform within normal limits, carotid upstroke brisk and symmetric, no bruits, no thyromegaly LYMPHATICS:  No cervical, inguinal adenopathy LUNGS:  Clear to auscultation bilaterally BACK:  No CVA tenderness CHEST:  Unremarkable HEART:  PMI not displaced or sustained,S1 and S2 within normal limits, no S3, no S4, no clicks, no rubs, no  murmurs ABD:  Flat, positive bowel sounds normal in frequency in pitch, no bruits, no rebound, no guarding, no midline pulsatile mass, no hepatomegaly, no splenomegaly EXT:  2 plus pulses throughout, no edema, no cyanosis no clubbing SKIN:  No rashes no nodules NEURO:  Cranial nerves II through XII grossly intact, motor grossly intact throughout PSYCH:  Cognitively intact, oriented to person place and time    EKG:  EKG is not ordered today. The ekg ordered 2/5/26demonstrates sinus rhythm, rate 90, incomplete right bundle branch block, no acute ST-T wave changes.   Recent Labs: 04/16/2014: TSH 1.19 04/26/2014: ALT 55*; BUN 16; Creatinine 0.96; Hemoglobin 14.8; Platelets 238.0; Potassium 4.5; Sodium  137    Lipid Panel    Component Value Date/Time   CHOL 171 04/16/2014 1022   TRIG 202.0* 04/16/2014 1022   HDL 35.70* 04/16/2014 1022   CHOLHDL 5 04/16/2014 1022   VLDL 40.4* 04/16/2014 1022   LDLCALC 106* 12/26/2013 0838   LDLDIRECT 108.0 04/16/2014 1022      Wt Readings from Last 3 Encounters:  05/03/14 248 lb (112.492 kg)  04/26/14 247 lb 4 oz (112.152 kg)  04/16/14 250 lb 9.6 oz (113.671 kg)      Other studies Reviewed: Additional studies/ records that were reviewed today include: Hospital records. Review of the above records demonstrates:  Please see elsewhere in the note.  See above   ASSESSMENT AND PLAN:  CHEST PAIN:  This sounds like exertional angina. He needs stress testing. He would not be a walk on a treadmill. Therefore, he will have a [employer].  We did discuss the fact that if he has a negative test but his pain worsens he would need to still come back and see me.  HTN:  His blood pressure is slightly elevated but his meds were recently changed. We talked about therapeutic lifestyle changes.  OVERWEIGHT:  The patient understands the need to lose weight with diet and exercise. We have discussed specific strategies for this.   Current medicines are  reviewed at length with the patient today.  The patient does not have concerns regarding medicines.  The following changes have been made:  no change  Labs/ tests ordered today include: Lexiscan Myoview. Orders Placed This Encounter  Procedures  . EKG 12-Lead   Lab Results  Component Value Date   HGBA1C 7.9* 03/21/2014     Disposition:   FU with me in 3 months.      Signed, Minus Breeding, MD  05/03/2014 12:42 PM    [facility]

## 2014-05-18 ENCOUNTER — Encounter (HOSPITAL_COMMUNITY): Payer: Self-pay | Admitting: Physician Assistant

## 2014-05-18 DIAGNOSIS — E669 Obesity, unspecified: Secondary | ICD-10-CM | POA: Diagnosis not present

## 2014-05-18 DIAGNOSIS — I2510 Atherosclerotic heart disease of native coronary artery without angina pectoris: Secondary | ICD-10-CM | POA: Diagnosis present

## 2014-05-18 DIAGNOSIS — Z955 Presence of coronary angioplasty implant and graft: Secondary | ICD-10-CM | POA: Diagnosis not present

## 2014-05-18 DIAGNOSIS — E785 Hyperlipidemia, unspecified: Secondary | ICD-10-CM

## 2014-05-18 DIAGNOSIS — I200 Unstable angina: Secondary | ICD-10-CM | POA: Diagnosis not present

## 2014-05-18 DIAGNOSIS — E119 Type 2 diabetes mellitus without complications: Secondary | ICD-10-CM | POA: Diagnosis not present

## 2014-05-18 DIAGNOSIS — I10 Essential (primary) hypertension: Secondary | ICD-10-CM | POA: Diagnosis not present

## 2014-05-18 DIAGNOSIS — I25110 Atherosclerotic heart disease of native coronary artery with unstable angina pectoris: Secondary | ICD-10-CM | POA: Diagnosis not present

## 2014-05-18 DIAGNOSIS — K5790 Diverticulosis of intestine, part unspecified, without perforation or abscess without bleeding: Secondary | ICD-10-CM | POA: Diagnosis not present

## 2014-05-18 LAB — BASIC METABOLIC PANEL
Anion gap: 8 (ref 5–15)
BUN: 14 mg/dL (ref 6–23)
CALCIUM: 8.7 mg/dL (ref 8.4–10.5)
CHLORIDE: 103 mmol/L (ref 96–112)
CO2: 25 mmol/L (ref 19–32)
CREATININE: 0.85 mg/dL (ref 0.50–1.35)
GFR calc Af Amer: >90 mL/min (ref >90)
GFR, EST NON AFRICAN AMERICAN: 87 mL/min — AB (ref >90)
GLUCOSE: 184 mg/dL — AB (ref 70–99)
POTASSIUM: 4 mmol/L (ref 3.5–5.1)
Sodium: 136 mmol/L (ref 135–145)

## 2014-05-18 LAB — GLUCOSE, CAPILLARY: GLUCOSE-CAPILLARY: 214 mg/dL — AB (ref 70–99)

## 2014-05-18 LAB — CBC
HCT: 39 % (ref 39.0–52.0)
Hemoglobin: 13.6 g/dL (ref 13.0–17.0)
MCH: 31 pg (ref 26.0–34.0)
MCHC: 34.9 g/dL (ref 30.0–36.0)
MCV: 88.8 fL (ref 78.0–100.0)
PLATELETS: 147 10*3/uL — AB (ref 150–400)
RBC: 4.39 MIL/uL (ref 4.22–5.81)
RDW: 13.3 % (ref 11.5–15.5)
WBC: 5.3 10*3/uL (ref 4.0–10.5)

## 2014-05-18 LAB — PLATELET INHIBITION P2Y12: Platelet Function  P2Y12: 231 [PRU] (ref 194–418)

## 2014-05-18 MED ORDER — METOPROLOL TARTRATE 25 MG PO TABS: 25.0000 mg | ORAL_TABLET | Freq: Two times a day (BID) | ORAL | Status: DC

## 2014-05-18 MED ORDER — LOSARTAN POTASSIUM 50 MG PO TABS: 75.0000 mg | ORAL_TABLET | Freq: Every day | ORAL | Status: DC

## 2014-05-18 MED ORDER — PRASUGREL HCL 10 MG PO TABS: 10.0000 mg | ORAL_TABLET | Freq: Every day | ORAL | Status: DC

## 2014-05-18 MED ORDER — PRASUGREL HCL 10 MG PO TABS
10.0000 mg | ORAL_TABLET | Freq: Every day | ORAL | Status: DC
  Filled 2014-05-18: qty 1

## 2014-05-18 NOTE — Discharge Summary (Signed)
Discharge Summary   Patient ID: Michael Cunningham MRN: [id number], DOB/AGE: 1945/09/13 69 y.o. Admit date: 05/17/2014 D/C date:     05/18/2014  Primary Cardiologist: Dr. Percival Spanish  Principal Problem:   Unstable angina Active Problems:   Hypertension   Hyperlipidemia   Diabetes mellitus type 2 in obese   Obesity   CAD (coronary artery disease)    Admission Dates: 05/17/14-05/18/14 Discharge Diagnosis: Unstable angina secondary to severe diffuse disease in the proximal, mid and distal LAD s/p successful PTCA/DES x 4 proximal, mid and distal LAD and severe stenosis D1- now s/p successful PTCA/DES x 1 to DI  HPI: Michael Cunningham is a 69 y.o. male with a history of obesity, HTN, DMT2, HLD and no prior cardiac history who was admitted to [facility] on 05/17/14 for scheduled LHC after a high risk stress test and chest pain as an outpatient.   He was initially seen by Dr. Percival Spanish on 05/03/14 for evaluation of exertional chest pain while shoveling snow. He underwent nuclear stress test on 05/15/13 which returned high risk and he was set up for cardiac catheterization the following day.   Hospital Course  CAD/USA-  -- s/p LHC on 05/17/14 which revealed  1. Unstable angina secondary to severe diffuse disease in the proximal, mid and distal LAD s/p successful PTCA/DES x 4 proximal, mid and distal LAD 2. Severe stenosis D1- now s/p successful PTCA/DES x 1 to DI 3. Moderate disease OM1, does not appear to be flow limiting.  4. Normal LV function -- Plan was to continue ASA/plavix for a lifetime given DM and long segment of stenting down entire LAD. A P2y12 was checked and returned at 231. This is very borderline, but will air on the side of caution with extensive stenting and change Plavix to Effient. Continue metoprolol. Continue statin.  -- Radial site stable.  -- Will continue with cardiac rehab.  DM- continue home regimen  HLD- continue simvastatin  (low-intensity) for now but would consider high-intensity statin therapy as an outpatient.  HTN- continue Losartan 75mg  qd and metoprolol 25mg  BID.   The patient has had an uncomplicated hospital course and is recovering well. The radial catheter site is stable. He has been seen by Dr. Bronson Ing today and deemed ready for discharge home. A staff message has been sent to set up follow-up appointments.  Discharge medications are listed below.   Discharge Vitals: Blood pressure 164/98, pulse 83, temperature 98 F (36.7 C), temperature source Oral, resp. rate 23, height 5\' 10"  (1.778 m), weight 243 lb 6.2 oz (110.4 kg), SpO2 98 %.  Labs: Lab Results  Component Value Date   WBC 5.3 05/18/2014   HGB 13.6 05/18/2014   HCT 39.0 05/18/2014   MCV 88.8 05/18/2014   PLT 147* 05/18/2014     Recent Labs Lab 05/18/14 0328  NA 136  K 4.0  CL 103  CO2 25  BUN 14  CREATININE 0.85  CALCIUM 8.7  GLUCOSE 184*    Lab Results  Component Value Date   CHOL 171 04/16/2014   HDL 35.70* 04/16/2014   LDLCALC 106* 12/26/2013   TRIG 202.0* 04/16/2014     Diagnostic Studies/Procedures   Ct Abdomen Pelvis W Contrast  04/19/2014   CLINICAL DATA:  Generalized abdominal pain.  Nausea and vomiting.  EXAM: CT ABDOMEN AND PELVIS WITH CONTRAST  TECHNIQUE: Multidetector CT imaging of the abdomen and pelvis was performed using the standard protocol following bolus administration of intravenous contrast.  CONTRAST:  72mL  OMNIPAQUE IOHEXOL 300 MG/ML SOLN, 112mL OMNIPAQUE IOHEXOL 300 MG/ML SOLN  COMPARISON:  04/24/2013.  06/13/2011  FINDINGS: There is mild chronic atelectasis at the right lung base with elevation of right hemidiaphragm. No consolidation to suggest pneumonia. Coronary artery calcifications are seen.  Stomach is distended with ingested contrast. There are dilated proximal small bowel loops that are fluid-filled. There is general transition from dilated to nondilated bowel loops in the right mid  abdomen, no abrupt transition point is seen. This is, however at site of prior small bowel obstruction from 2013 and may be related to adhesions. Small bowel loops distal to this are decompressed. Small to moderate volume of colonic stool. No colonic wall thickening. Multiple colonic diverticula involving the descending colon without diverticulitis. No free air, free fluid, or intra-abdominal fluid collection.  The liver demonstrates diffusely decreased density without focal hepatic lesion. Dependent gallstones in the gallbladder which is physiologically distended. No pericholecystic inflammatory change. The spleen, adrenal glands, and pancreas are normal. Kidneys demonstrate symmetric enhancement and excretion without hydronephrosis or perinephric stranding. Small fat containing umbilical hernia.  The abdominal aorta is normal in caliber with moderate atherosclerosis. No retroperitoneal adenopathy.  Within the pelvis the urinary bladder is physiologically distended. Prostate gland is normal in size. No pelvic free fluid or adenopathy.  Multilevel degenerative change throughout the spine. No acute osseous abnormality.  IMPRESSION: 1. Findings suggest partial small bowel obstruction, with general transition from dilated to nondilated in the right mid abdomen. This is at a similar site seen on CT of March 2013 and suggest adhesions. 2. Incidental findings of cholelithiasis, colonic diverticulosis, and hepatic steatosis.   Electronically Signed   By: Jeb Levering M.D.   On: 04/19/2014 07:06   Dg Abd Portable 1v  04/19/2014   CLINICAL DATA:  Partial small bowel obstruction. Small bowel obstruction protocol. Evaluate for contrast in the colon.  EXAM: PORTABLE ABDOMEN - 1 VIEW  COMPARISON:  CT and plain films of earlier in the day.  FINDINGS: Persistent small bowel dilatation, with small bowel loop measuring 4.4 cm in the left side of the abdomen. Compare 5.1 cm on the other plain film. Contrast is seen within the  small bowel, as well as throughout the imaged ascending and transverse colon. The descending colon is partially excluded. Contrast also seen within the urinary bladder. No gross free intraperitoneal air. Nasogastric tube terminates at the body of the stomach.  IMPRESSION: Contrast transit into the colon, as detailed above.  Improved small bowel obstruction pattern.   Electronically Signed   By: Abigail Miyamoto M.D.   On: 04/19/2014 21:25   Dg Abd Portable 1v  04/19/2014   CLINICAL DATA:  Nasogastric tube placement. Nausea with abdominal pain  EXAM: PORTABLE ABDOMEN - 1 VIEW  COMPARISON:  CT abdomen and pelvis obtained earlier in the day  FINDINGS: Nasogastric tube tip and side port are in the stomach. Multiple loops of dilated small bowel are consistent with the obstruction pattern seen on CT earlier in the day. No free air seen on this supine examination.  IMPRESSION: Evidence of small bowel obstruction. Nasogastric tube tip and side port in stomach.   Electronically Signed   By: Lowella Grip M.D.   On: 04/19/2014 10:05   05/16/13 Cardiac Catheterization Operative Report Indication: 69 yo male with unstable angina, high risk stress test showing anterior wall ischemia. Hemodynamic Findings: Central aortic pressure: 132/58 Left ventricular pressure: 136/4/17 Angiographic Findings: Left main: No obstructive disease.  Left Anterior Descending Artery: Moderate  caliber vessel that courses to the apex. The vessel is diffusely diseased. There is an ostial 80% stenosis that extends into the proximal vessel. The mid vessel has diffuse 80% stenosis. The distal vessel has diffuse 70% stenosis with focal 99% stenosis. The diagonal branch is small to moderate in caliber with 99% mid stenosis.  Circumflex Artery: Large caliber vessel with moderate caliber obtuse marginal branch and several moderate caliber left posterolateral branches. The AV groove Circumflex has diffuse  non-obstructive plaque. The obtuse marginal branch is a moderate caliber vessel with proximal and mid 40% stenosis.  Right Coronary Artery: Small non-dominant vessel with mid 40% stenosis.  Left Ventricular Angiogram: LVEF=65-70%.  Impression: 1. Unstable angina secondary to severe diffuse disease in the proximal, mid and distal LAD s/p successful PTCA/DES x 4 proximal, mid and distal LAD 2. Severe stenosis Diagonal 1 now s/p successful PTCA/DES x 1 Diagonal 1 3. Moderate disease OM1, does not appear to be flow limiting.  4. Normal LV function Recommendations: Will continue ASA and Plavix for lifetime given diabetes and long segment of stenting in the entire LAD. Will check P2y12 in the am. If PRU is over 230, will need to change Plavix to Effient. Will start metoprolol. Continue statin.    Discharge Medications     Medication List    TAKE these medications        acetaminophen 500 MG tablet  Commonly known as:  TYLENOL  Take 1,000 mg by mouth every 6 (six) hours as needed for mild pain.     aspirin EC 81 MG tablet  Take 81 mg by mouth daily with breakfast.     clobetasol ointment 0.05 %  Commonly known as:  TEMOVATE  Apply 1 application topically 2 (two) times daily. For dermatitis on feet and ankles (2 week course started 05/16/14     fluorouracil 5 % cream  Commonly known as:  EFUDEX  Apply 1 application topically at bedtime. 2 week course started 05/16/14     insulin NPH Human 100 UNIT/ML injection  Commonly known as:  HUMULIN N  Inject 0.35 mLs (35 Units total) into the skin at bedtime.     insulin regular 100 units/mL injection  Commonly known as:  HUMULIN R  3 times a day (just before each meal), 40-30-45 units.     Insulin Syringe-Needle U-100 31G X 5/16" 0.5 ML Misc  Commonly known as:  RELION INSULIN SYR 0.5ML/31G  USE TO INJECT INSULIN FOUR TIMES PER DAY DX code E11.9     losartan 50 MG tablet  Commonly known as:  COZAAR  Take 1.5 tablets (75 mg total) by  mouth daily. 1 1/2 tablets every morning     metoprolol tartrate 25 MG tablet  Commonly known as:  LOPRESSOR  Take 1 tablet (25 mg total) by mouth 2 (two) times daily.     multivitamin with minerals Tabs tablet  Take 1 tablet by mouth daily.     nitroGLYCERIN 0.4 MG SL tablet  Commonly known as:  NITROSTAT  Place 1 tablet (0.4 mg total) under the tongue every 5 (five) minutes as needed for chest pain.     pantoprazole 40 MG tablet  Commonly known as:  PROTONIX  Take 1 tablet (40 mg total) by mouth daily.     prasugrel 10 MG Tabs tablet  Commonly known as:  EFFIENT  Take 1 tablet (10 mg total) by mouth daily.     simvastatin 20 MG tablet  Commonly known as:  ZOCOR  Take 1 tablet (20 mg total) by mouth every evening.     Vitamin D (Ergocalciferol) 50000 UNITS Caps capsule  Commonly known as:  DRISDOL  Take 1 capsule (50,000 Units total) by mouth every 7 (seven) days. For 12 weeks        Disposition   The patient will be discharged in stable condition to home. Discharge Instructions    Amb Referral to Cardiac Rehabilitation    Complete by:  As directed           Follow-up Information    Follow up with Minus Breeding, MD.   Specialty:  Cardiology   Why:  The office will call you to make an appoinment., If you do not hear from them, please contact them., You should be seen within 1-2 weeks.   Contact information:   [place] [address] [phone]         Duration of Discharge Encounter: Greater than 30 minutes including physician and PA time.  SignedAngelena Form R PA-C 05/18/2014, 9:50 AM

## 2014-05-18 NOTE — Progress Notes (Signed)
Patient Name: Demian Maisel Spinnato Date of Encounter: 05/18/2014     Active Problems:   Unstable angina    SUBJECTIVE  Feeling well. No further CP or SOB.  Ready to go home.   CURRENT MEDS . aspirin  81 mg Oral Daily  . clopidogrel  75 mg Oral Q breakfast  . insulin aspart  35 Units Subcutaneous TID WC  . insulin NPH Human  35 Units Subcutaneous QHS  . losartan  75 mg Oral Daily  . metoprolol tartrate  25 mg Oral BID  . simvastatin  20 mg Oral QPM    OBJECTIVE  Filed Vitals:   05/17/14 2349 05/17/14 2350 05/18/14 0400 05/18/14 0602  BP: 132/68 132/68 148/79   Pulse: 78 79 75   Temp: 97.6 F (36.4 C)  97.6 F (36.4 C)   TempSrc: Oral  Oral   Resp: 18 18 16    Height:      Weight:    243 lb 6.2 oz (110.4 kg)  SpO2: 95% 95% 99%     Intake/Output Summary (Last 24 hours) at 05/18/14 0739 Last data filed at 05/18/14 0139  Gross per 24 hour  Intake  287.5 ml  Output   5003 ml  Net -4715.5 ml   Filed Weights   05/17/14 1127 05/18/14 0602  Weight: 240 lb (108.863 kg) 243 lb 6.2 oz (110.4 kg)    PHYSICAL EXAM  General: Pleasant, NAD. obese Neuro: Alert and oriented X 3. Moves all extremities spontaneously. Psych: Normal affect. HEENT:  Normal  Neck: Supple without bruits or JVD. Lungs:  Resp regular and unlabored, CTA. Heart: RRR no s3, s4, or murmurs. Abdomen: Soft, non-tender, non-distended, BS + x 4.  Extremities: No clubbing, cyanosis or edema. DP/PT/Radials 2+ and equal bilaterally.  Accessory Clinical Findings  CBC  Recent Labs  05/17/14 1243 05/18/14 0328  WBC 5.4 5.3  HGB 14.7 13.6  HCT 41.6 39.0  MCV 89.7 88.8  PLT 156 370*   Basic Metabolic Panel  Recent Labs  05/17/14 1243 05/18/14 0328  NA 136 136  K 4.6 4.0  CL 100 103  CO2 27 25  GLUCOSE 234* 184*  BUN 14 14  CREATININE 0.82 0.85  CALCIUM 9.4 8.7    TELE  NSR  Radiology/Studies  Ct Abdomen Pelvis W Contrast  04/19/2014   CLINICAL DATA:  Generalized abdominal pain.   Nausea and vomiting.  EXAM: CT ABDOMEN AND PELVIS WITH CONTRAST  TECHNIQUE: Multidetector CT imaging of the abdomen and pelvis was performed using the standard protocol following bolus administration of intravenous contrast.  CONTRAST:  42mL OMNIPAQUE IOHEXOL 300 MG/ML SOLN, 191mL OMNIPAQUE IOHEXOL 300 MG/ML SOLN  COMPARISON:  04/24/2013.  06/13/2011  FINDINGS: There is mild chronic atelectasis at the right lung base with elevation of right hemidiaphragm. No consolidation to suggest pneumonia. Coronary artery calcifications are seen.  Stomach is distended with ingested contrast. There are dilated proximal small bowel loops that are fluid-filled. There is general transition from dilated to nondilated bowel loops in the right mid abdomen, no abrupt transition point is seen. This is, however at site of prior small bowel obstruction from 2013 and may be related to adhesions. Small bowel loops distal to this are decompressed. Small to moderate volume of colonic stool. No colonic wall thickening. Multiple colonic diverticula involving the descending colon without diverticulitis. No free air, free fluid, or intra-abdominal fluid collection.  The liver demonstrates diffusely decreased density without focal hepatic lesion. Dependent gallstones in the gallbladder which  is physiologically distended. No pericholecystic inflammatory change. The spleen, adrenal glands, and pancreas are normal. Kidneys demonstrate symmetric enhancement and excretion without hydronephrosis or perinephric stranding. Small fat containing umbilical hernia.  The abdominal aorta is normal in caliber with moderate atherosclerosis. No retroperitoneal adenopathy.  Within the pelvis the urinary bladder is physiologically distended. Prostate gland is normal in size. No pelvic free fluid or adenopathy.  Multilevel degenerative change throughout the spine. No acute osseous abnormality.  IMPRESSION: 1. Findings suggest partial small bowel obstruction, with  general transition from dilated to nondilated in the right mid abdomen. This is at a similar site seen on CT of March 2013 and suggest adhesions. 2. Incidental findings of cholelithiasis, colonic diverticulosis, and hepatic steatosis.   Electronically Signed   By: Jeb Levering M.D.   On: 04/19/2014 07:06   Dg Abd Portable 1v  04/19/2014   CLINICAL DATA:  Partial small bowel obstruction. Small bowel obstruction protocol. Evaluate for contrast in the colon.  EXAM: PORTABLE ABDOMEN - 1 VIEW  COMPARISON:  CT and plain films of earlier in the day.  FINDINGS: Persistent small bowel dilatation, with small bowel loop measuring 4.4 cm in the left side of the abdomen. Compare 5.1 cm on the other plain film. Contrast is seen within the small bowel, as well as throughout the imaged ascending and transverse colon. The descending colon is partially excluded. Contrast also seen within the urinary bladder. No gross free intraperitoneal air. Nasogastric tube terminates at the body of the stomach.  IMPRESSION: Contrast transit into the colon, as detailed above.  Improved small bowel obstruction pattern.   Electronically Signed   By: Abigail Miyamoto M.D.   On: 04/19/2014 21:25   Dg Abd Portable 1v  04/19/2014   CLINICAL DATA:  Nasogastric tube placement. Nausea with abdominal pain  EXAM: PORTABLE ABDOMEN - 1 VIEW  COMPARISON:  CT abdomen and pelvis obtained earlier in the day  FINDINGS: Nasogastric tube tip and side port are in the stomach. Multiple loops of dilated small bowel are consistent with the obstruction pattern seen on CT earlier in the day. No free air seen on this supine examination.  IMPRESSION: Evidence of small bowel obstruction. Nasogastric tube tip and side port in stomach.   Electronically Signed   By: Lowella Grip M.D.   On: 04/19/2014 10:05    ASSESSMENT AND PLAN Husain Costabile Nikolic is a 69 y.o. male with a history of obesity, HTN, DMT2, HLD and no prior cardiac history who was admitted to [facility] on 05/17/14  for scheduled LHC after a high risk stress test and chest pain as an outpatient.   CAD/USA-  -- s/p LHC on 05/17/14 which revealed   1. Unstable angina secondary to severe diffuse disease in the proximal, mid and distal LAD s/p successful PTCA/DES x 4 proximal, mid and distal LAD  2. Severe stenosis Diagonal 1 now s/p successful PTCA/DES x 1 Diagonal 1  3. Moderate disease OM1, does not appear to be flow limiting.   4. Normal LV function -- Plan was to continue ASA/plavix for a lifetime given DM and long segment of stenting down entire LAD. A  P2y12 was checked and returned at 231. This is very borderline, but will air on the side of caution and change Plavix to Effient. Continue metoprolol. Continue statin.  -- Radial site stable.   DM- continue home regimen  HLD- cont statin  HTN- continue Losartan 75mg  qd and metoprolol 25mg  BID.   Dispo- likely home today  with continued follow up with Dr. Percival Spanish     Signed, [place] Silos [facility] R PA-C  Pager [phone]  The patient was seen and examined, and I agree with the assessment and plan as documented above. Mr. Hayashida is feeling very well and eager to go home. Denies chest pain, palpitations, and shortness of breath. Importance of DAPT with ASA and Effient emphasized, likely lifelong therapy. Continue simvastatin (low-intensity) for now but would consider high-intensity statin therapy as an outpatient.

## 2014-05-18 NOTE — Discharge Instructions (Signed)

## 2014-05-18 NOTE — Progress Notes (Signed)
CARDIAC REHAB PHASE I   PRE:  Rate/Rhythm: 85 SR  BP:  Supine:   Sitting: 175/86  Standing:    SaO2:   MODE:  Ambulation: 1000 ft   POST:  Rate/Rhythm: 106 ST  BP:  Supine:   Sitting: 180/109 recheck 172/87  Standing:    SaO2:  0750-0855 Pt tolerated ambulation well without c/o of cp or SOB. BP elevated before and after walk. Pt to recliner after walk with call light in reach. Completed stent education with pt. He voices understanding. Pt agrees to [facility]. CRP in [place], will send referral.  Rodney Langton RN 05/18/2014 8:51 AM

## 2014-05-18 NOTE — Progress Notes (Signed)
TR BAND REMOVAL  LOCATION:    right radial  DEFLATED PER PROTOCOL:    Yes.    TIME BAND OFF / DRESSING APPLIED:    21:00   SITE UPON ARRIVAL:    Level 0  SITE AFTER BAND REMOVAL:    Level 0  REVERSE ALLEN'S TEST:     positive  CIRCULATION SENSATION AND MOVEMENT:    Within Normal Limits   Yes.    COMMENTS:   Pt tolerated removal of TR band without complication.  Will continue to monitor patient.

## 2014-05-18 NOTE — Care Management Note (Signed)
    Page 1 of 1   05/18/2014     9:23:33 AM CARE MANAGEMENT NOTE 05/18/2014  Patient:  Michael Cunningham, Michael Cunningham   Account Number:  [id number]  Date Initiated:  05/18/2014  Documentation initiated by:  [facility]  Subjective/Objective Assessment:   adm: CHEST PAIN.     Action/Plan:   [place] planning   Anticipated DC Date:  05/18/2014   Anticipated DC Plan:  [place] [facility]  Medication Assistance  CM consult      Choice offered to / List presented to:             Status of service:  Completed, signed off Medicare Important Message given?   (If response is "NO", the following Medicare IM given date fields will be blank) Date Medicare IM given:   Medicare IM given by:   Date Additional Medicare IM given:   Additional Medicare IM given by:    Discharge Disposition:  HOME/SELF CARE  Per UR Regulation:    If discussed at Long Length of Stay Meetings, dates discussed:    Comments:  05/18/14 09:00 CM met with pt to give hie an effient 30 day free trial card; CR had already given pt card.  Pt verbalized understanding he will ask office staf at his follow up visit to make sure medication is authorized to cover the cost of refills.  No other CM needs were communicated.  Mariane Masters, BSN, Cm [phone].

## 2014-05-19 ENCOUNTER — Telehealth: Payer: Self-pay | Admitting: Cardiology

## 2014-05-19 NOTE — Telephone Encounter (Signed)
Pt's pharmacy is not open on weekend- I called in lopressor 25 mg BID #60 with no refills for now.  To [facility] [phone].

## 2014-05-20 ENCOUNTER — Telehealth: Payer: Self-pay

## 2014-05-20 NOTE — Telephone Encounter (Signed)
Pt agreeable with date and time.  Please schedule patient accordingly.

## 2014-05-20 NOTE — Telephone Encounter (Signed)
Pt scheduled for 715 3/18

## 2014-05-20 NOTE — Telephone Encounter (Signed)
Admit date: 05/17/2014 D/C date: 05/18/2014  Reason for admission:  Unstable Angina  Pt is still waiting to hear back from cardiology regarding a hospital follow.  Pt states he would also like to follow up with Dr. Charlett Blake.  He states he does not want to see anyone but her and wants to know if she is able to work him into schedule given that she does not have any openings within the next 2 weeks.  Please advise.

## 2014-05-20 NOTE — Telephone Encounter (Signed)
Can you schedule this patient for me? Thanks!

## 2014-05-20 NOTE — Telephone Encounter (Signed)
I am out the rest of this week, could come in early on the following Friday, 3/18 to see him at 7:15

## 2014-05-23 ENCOUNTER — Other Ambulatory Visit: Payer: Self-pay | Admitting: Gastroenterology

## 2014-05-23 DIAGNOSIS — R198 Other specified symptoms and signs involving the digestive system and abdomen: Secondary | ICD-10-CM

## 2014-05-23 DIAGNOSIS — R933 Abnormal findings on diagnostic imaging of other parts of digestive tract: Secondary | ICD-10-CM | POA: Diagnosis not present

## 2014-05-23 DIAGNOSIS — K631 Perforation of intestine (nontraumatic): Secondary | ICD-10-CM | POA: Diagnosis not present

## 2014-05-23 DIAGNOSIS — Z86010 Personal history of colonic polyps: Secondary | ICD-10-CM | POA: Diagnosis not present

## 2014-05-24 ENCOUNTER — Encounter: Payer: Self-pay | Admitting: Cardiology

## 2014-05-24 ENCOUNTER — Telehealth (HOSPITAL_COMMUNITY): Payer: Self-pay | Admitting: Cardiology

## 2014-05-24 NOTE — Telephone Encounter (Signed)
Received records from [place] for appointment with Kerin Ransom, PA on 06/06/14.  Gave records to [employer] (medical records) for Luke's schedule on 06/06/14.  lp

## 2014-05-26 ENCOUNTER — Other Ambulatory Visit: Payer: Self-pay | Admitting: Endocrinology

## 2014-05-28 ENCOUNTER — Other Ambulatory Visit: Payer: Self-pay | Admitting: Endocrinology

## 2014-05-31 ENCOUNTER — Ambulatory Visit (INDEPENDENT_AMBULATORY_CARE_PROVIDER_SITE_OTHER): Payer: Medicare Other | Admitting: Family Medicine

## 2014-05-31 ENCOUNTER — Encounter: Payer: Self-pay | Admitting: Family Medicine

## 2014-05-31 VITALS — BP 132/72 | HR 78 | Temp 98.4°F | Ht 70.0 in | Wt 245.4 lb

## 2014-05-31 DIAGNOSIS — E782 Mixed hyperlipidemia: Secondary | ICD-10-CM | POA: Diagnosis not present

## 2014-05-31 DIAGNOSIS — I10 Essential (primary) hypertension: Secondary | ICD-10-CM | POA: Diagnosis not present

## 2014-05-31 DIAGNOSIS — E785 Hyperlipidemia, unspecified: Secondary | ICD-10-CM

## 2014-05-31 DIAGNOSIS — I2510 Atherosclerotic heart disease of native coronary artery without angina pectoris: Secondary | ICD-10-CM | POA: Diagnosis not present

## 2014-05-31 DIAGNOSIS — E1169 Type 2 diabetes mellitus with other specified complication: Secondary | ICD-10-CM

## 2014-05-31 DIAGNOSIS — E119 Type 2 diabetes mellitus without complications: Secondary | ICD-10-CM

## 2014-05-31 DIAGNOSIS — E669 Obesity, unspecified: Secondary | ICD-10-CM

## 2014-05-31 MED ORDER — SIMVASTATIN 40 MG PO TABS: 40.0000 mg | ORAL_TABLET | Freq: Every day | ORAL | Status: DC

## 2014-05-31 MED ORDER — "INSULIN SYRINGE-NEEDLE U-100 31G X 5/16"" 0.5 ML MISC": Status: DC

## 2014-05-31 NOTE — Progress Notes (Signed)
Pre visit review using our clinic review tool, if applicable. No additional management support is needed unless otherwise documented below in the visit note. 

## 2014-05-31 NOTE — Patient Instructions (Signed)

## 2014-06-06 ENCOUNTER — Telehealth: Payer: Self-pay

## 2014-06-06 ENCOUNTER — Ambulatory Visit (INDEPENDENT_AMBULATORY_CARE_PROVIDER_SITE_OTHER): Payer: Medicare Other | Admitting: Cardiology

## 2014-06-06 ENCOUNTER — Encounter: Payer: Self-pay | Admitting: Cardiology

## 2014-06-06 VITALS — BP 124/76 | HR 73 | Ht 70.0 in | Wt 248.4 lb

## 2014-06-06 DIAGNOSIS — E119 Type 2 diabetes mellitus without complications: Secondary | ICD-10-CM

## 2014-06-06 DIAGNOSIS — E1169 Type 2 diabetes mellitus with other specified complication: Secondary | ICD-10-CM

## 2014-06-06 DIAGNOSIS — I10 Essential (primary) hypertension: Secondary | ICD-10-CM | POA: Diagnosis not present

## 2014-06-06 DIAGNOSIS — E785 Hyperlipidemia, unspecified: Secondary | ICD-10-CM

## 2014-06-06 DIAGNOSIS — I2510 Atherosclerotic heart disease of native coronary artery without angina pectoris: Secondary | ICD-10-CM

## 2014-06-06 DIAGNOSIS — E669 Obesity, unspecified: Secondary | ICD-10-CM

## 2014-06-06 NOTE — Assessment & Plan Note (Signed)
Chest pain - abnormal OP Myoview- Cath 05/17/2014- s/p DES x4 to mid-distLAD and DESx1 to DI

## 2014-06-06 NOTE — Progress Notes (Signed)
06/06/2014 Michael Cunningham   11/17/1945  [id number]  Primary Physician Michael Homans, MD Primary Cardiologist: Dr Michael Cunningham  HPI:  Pleasant 69 y/o obese male with dyslipidemia, HTN, and DM, developed SSCP while shoveling snow in Feb. He mentioned this to his PCP who arranged for an OV with Dr Michael Cunningham. Myoview was done and was abnormal. Pt was admitted the nex day for cath and underwent extensive LAD stenting and Dx stenting. His P2Y12 was elevated and he was discharged on lifelong ASA and Effient. He is in the office today  For follow up. H denies chest pain and is tolerating his medications well. He had problems with myalgias in the past on Lipitor and was changed to low dose Zocor. This was recently increased by his PCP to 40 mg and he seem to be tolerating this dose.    Current Outpatient Prescriptions  Medication Sig Dispense Refill  . acetaminophen (TYLENOL) 500 MG tablet Take 1,000 mg by mouth every 6 (six) hours as needed for mild pain.    [name] aspirin EC 81 MG tablet Take 81 mg by mouth daily with breakfast.     . clobetasol ointment (TEMOVATE) 5.85 % Apply 1 application topically 2 (two) times daily. For dermatitis on feet and ankles (2 week course started 05/16/14    . fluorouracil (EFUDEX) 5 % cream Apply 1 application topically at bedtime. 2 week course started 05/16/14    . HUMULIN N 100 UNIT/ML injection INJECT 35 UNITS SUBCUTANEOUSLY AT BEDTIME EVERY NIGHT 20 mL 0  . insulin regular (HUMULIN R) 100 units/mL injection 3 times a day (just before each meal), 40-30-45 units. (Patient taking differently: Inject 30-45 Units into the skin 3 (three) times daily before meals. Inject 40 units before breakfast, 30 units before lunch and 45 units before supper) 40 mL 11  . Insulin Syringe-Needle U-100 (RELION INSULIN SYR 0.5ML/31G) 31G X 5/16" 0.5 ML MISC USE SYRINGES TO INJECT INSULIN 4 TIMES DAILY 200 each 11  . losartan (COZAAR) 50 MG tablet Take 1.5 tablets (75 mg total) by mouth daily. 1 1/2  tablets every morning 180 tablet 0  . metoprolol tartrate (LOPRESSOR) 25 MG tablet Take 1 tablet (25 mg total) by mouth 2 (two) times daily. 60 tablet 11  . Multiple Vitamin (MULTIVITAMIN WITH MINERALS) TABS tablet Take 1 tablet by mouth daily.    . nitroGLYCERIN (NITROSTAT) 0.4 MG SL tablet Place 1 tablet (0.4 mg total) under the tongue every 5 (five) minutes as needed for chest pain. 90 tablet 3  . prasugrel (EFFIENT) 10 MG TABS tablet Take 1 tablet (10 mg total) by mouth daily. 30 tablet 11  . simvastatin (ZOCOR) 40 MG tablet Take 1 tablet (40 mg total) by mouth at bedtime. 90 tablet 1  . Vitamin D, Ergocalciferol, (DRISDOL) 50000 UNITS CAPS capsule Take 1 capsule (50,000 Units total) by mouth every 7 (seven) days. For 12 weeks (Patient taking differently: Take 50,000 Units by mouth every 7 (seven) days. For 12 weeks on Wednesdays started 05/01/14) 4 capsule 3   No current facility-administered medications for this visit.    Allergies  Allergen Reactions  . Ciprofloxacin Other (See Comments)    Abdominal pain  . Lipitor [Atorvastatin Calcium] Other (See Comments)    Severe muscle pain  . Lisinopril Cough  . Metformin And Related Diarrhea    History   Social History  . Marital Status: Married    Spouse Name: N/A  . Number of Children: 2  . Years  of Education: N/A   Occupational History  . Not on file.   Social History Main Topics  . Smoking status: Former Smoker -- 1.00 packs/day for 4 years    Types: Cigarettes    Quit date: 06/14/1967  . Smokeless tobacco: Never Used  . Alcohol Use: No  . Drug Use: No  . Sexual Activity: Yes     Comment: lives with wife   Other Topics Concern  . 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       Review of Systems: General: negative for chills, fever, night sweats or weight changes.  Cardiovascular: negative for chest pain, dyspnea on exertion, edema, orthopnea, palpitations,  paroxysmal nocturnal dyspnea or shortness of breath Dermatological: negative for rash Respiratory: negative for cough or wheezing Urologic: negative for hematuria Abdominal: negative for nausea, vomiting, diarrhea, bright red blood per rectum, melena, or hematemesis Neurologic: negative for visual changes, syncope, or dizziness No history of daytime somnolence or fatigue All other systems reviewed and are otherwise negative except as noted above.    Blood pressure 124/76, pulse 73, height 5\' 10"  (1.778 m), weight 248 lb 6.4 oz (112.674 kg).  General appearance: alert, cooperative, no distress and moderately obese Neck: no carotid bruit, no JVD and thick neck Lungs: clear to auscultation bilaterally Heart: regular rate and rhythm Abdomen: obese Extremities: some ecchymosis RUE, no hematoma  EKG NSR, septal Q  ASSESSMENT AND PLAN:   CAD (coronary artery disease) Chest pain - abnormal OP Myoview- Cath 05/17/2014- s/p DES x4 to mid-distLAD and DESx1 to DI   Hyperlipidemia Tolerating increased Zocor, followed by Dr Michael Cunningham   Diabetes mellitus type 2 in obese Diabetic since his 26s, mild neuropathy   Hypertension Controlled   Obesity BMI 35   PLAN  Same Rx, he has a follow up scheduled for Dr Michael Cunningham in May. His LDL goal will be less than 70.   Michael Cunningham KPA-C 06/06/2014 9:41 AM

## 2014-06-06 NOTE — Patient Instructions (Signed)
Your physician recommends that you schedule a follow-up appointment Please Keep Appointment with Dr Warren Lacy on 08/01/2014

## 2014-06-06 NOTE — Assessment & Plan Note (Signed)
BMI 35 

## 2014-06-06 NOTE — Assessment & Plan Note (Signed)
Tolerating increased Zocor, followed by Dr Randel Pigg

## 2014-06-06 NOTE — Telephone Encounter (Signed)
Patient returned, after leaving office visit with Kerin Ransom, PA. Patient was recently discharged from hospital with the impression that [facility] would contact him to begin program. He called [facility] after having not heard from them for +1 week. Whomever he spoke to said that paperwork had been sent to cardiologist (Dr Percival Spanish).  Fredia Beets, RN informed me that paperwork regarding Mr Joo and beginning Cardiac Rehab was placed in Dr Hochrein's box and that he came an picked up all paperwork in his box yesterday (06/05/2014).  Informed patient that Dr Percival Spanish has patient's paperwork, will review and decide, and that he will be back in the office on Monday. I let him know that I will make sure paperwork is signed and faxed to [facility] and will notify patient when I have sent paperwork over.

## 2014-06-06 NOTE — Assessment & Plan Note (Signed)
Diabetic since his 18s, mild neuropathy

## 2014-06-06 NOTE — Assessment & Plan Note (Signed)
Controlled.  

## 2014-06-10 NOTE — Progress Notes (Signed)
Michael Cunningham  [id number] [birth year] 06/10/2014      Progress Note-Follow Up  Subjective  Chief Complaint  Chief Complaint  Patient presents with  . Hospitalization Follow-up    HPI  Patient is a 69 y.o. male in today for routine medical care. He is here today for follow up after having numerous cardiac stents placed. He feels well. He has had no further chest pain, no SOB or palpitations. He feels well. No acute illness. Denies CP/palp/SOB/HA/congestion/fevers/GI or GU c/o. Taking meds as prescribed  Past Medical History  Diagnosis Date  . Hypertension   . Multiple lipomas     hx. mulitple and some remains -arms, legs  . Diverticulosis of colon     LEFT SIDE  . History of small bowel obstruction     X2  28 YRS AGO AND LAST ONE 04/2014--  RESOLVED WITHOUT SURGICAL INTERVENTION  . Type 2 diabetes mellitus   . Left ureteral calculus   . Complication of anesthesia     11'12 Surgery with excrutiating "head about to explode" upon awakening  -lasted several hours postop  ( SURGERY DONE 08-13-2011 AT [facility] PT DID OK POST OP)  . Measles =  . Hyperlipidemia   . Recurrent kidney stones 07/08/2013    Follows with Dr Gaynelle Arabian He believes they are Calcium oxalate stones  . Obesity, unspecified 09/18/2013  . CAD (coronary artery disease)     a. 05/2014 [place] s/p DES x4 to mid-distLAD and DESx1 to DI    Past Surgical History  Procedure Laterality Date  . Lipoma excision  1975    multiple, 10-12  . Knee arthroscopy  08/13/2011    right ARTHROSCOPY KNEE;  Surgeon: Gearlean Alf, MD;  Location: [facility];  Service: Orthopedics;  Laterality: Right;  medial  meniscal debridement, excision of plica, synovectomy  . Knee arthroscopy w/ meniscectomy Right 01-29-2011    twice, first trimmed mediscus and cyst  . Colonoscopy w/ polypectomy  LAST ONE 2013  . Knee arthroscopy Left 11/16/2013    Procedure: LEFT KNEE ARTHROSCOPY MEDIAL MENISCAL DEBRIDEMENT, CHONDROPLASTY;  Surgeon: Gearlean Alf, MD;   Location: [facility];  Service: Orthopedics;  Laterality: Left;  . Coronary stent placement  05/17/2014    PROXIMAL X 4  MID & DISTAL LAD  DIAGONAL   . Left heart catheterization with coronary angiogram N/A 05/17/2014    Procedure: LEFT HEART CATHETERIZATION WITH CORONARY ANGIOGRAM;  Surgeon: Burnell Blanks, MD;  Location: [facility] CATH LAB;  Service: Cardiovascular;  Laterality: N/A;  . Percutaneous coronary stent intervention (pci-s)  05/17/2014    Procedure: PERCUTANEOUS CORONARY STENT INTERVENTION (PCI-S);  Surgeon: Burnell Blanks, MD;  Location: [facility] CATH LAB;  Service: Cardiovascular;;  Diag1 and Prox to Distal LAD    Family History  Problem Relation Age of Onset  . Hypertension Mother   . Cancer Mother 51    ovarian  . Heart disease Father     CHF  . Heart attack Father 7  . Cancer Sister     breast- just finished her last radiation  . Heart disease Maternal Grandfather     ?  [name] Heart attack Paternal Grandfather 76  . Diabetes Neg Hx   . Heart attack Paternal Uncle 43    Sudden death    History   Social History  . Marital Status: Married    Spouse Name: N/A  . Number of Children: 2  . Years of Education: N/A   Occupational History  .  Not on file.   Social History Main Topics  . Smoking status: Former Smoker -- 1.00 packs/day for 4 years    Types: Cigarettes    Quit date: 06/14/1967  . Smokeless tobacco: Never Used  . Alcohol Use: No  . Drug Use: No  . Sexual Activity: Yes     Comment: lives with wife   Other Topics Concern  . 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      Current Outpatient Prescriptions on File Prior to Visit  Medication Sig Dispense Refill  . aspirin EC 81 MG tablet Take 81 mg by mouth daily with breakfast.     . clobetasol ointment (TEMOVATE) 1.61 % Apply 1 application topically 2 (two) times daily. For dermatitis on feet and ankles (2 week course started 05/16/14    . fluorouracil  (EFUDEX) 5 % cream Apply 1 application topically at bedtime. 2 week course started 05/16/14    . HUMULIN N 100 UNIT/ML injection INJECT 35 UNITS SUBCUTANEOUSLY AT BEDTIME EVERY NIGHT 20 mL 0  . insulin regular (HUMULIN R) 100 units/mL injection 3 times a day (just before each meal), 40-30-45 units. (Patient taking differently: Inject 30-45 Units into the skin 3 (three) times daily before meals. Inject 40 units before breakfast, 30 units before lunch and 45 units before supper) 40 mL 11  . losartan (COZAAR) 50 MG tablet Take 1.5 tablets (75 mg total) by mouth daily. 1 1/2 tablets every morning 180 tablet 0  . metoprolol tartrate (LOPRESSOR) 25 MG tablet Take 1 tablet (25 mg total) by mouth 2 (two) times daily. 60 tablet 11  . Multiple Vitamin (MULTIVITAMIN WITH MINERALS) TABS tablet Take 1 tablet by mouth daily.    . nitroGLYCERIN (NITROSTAT) 0.4 MG SL tablet Place 1 tablet (0.4 mg total) under the tongue every 5 (five) minutes as needed for chest pain. 90 tablet 3  . prasugrel (EFFIENT) 10 MG TABS tablet Take 1 tablet (10 mg total) by mouth daily. 30 tablet 11  . Vitamin D, Ergocalciferol, (DRISDOL) 50000 UNITS CAPS capsule Take 1 capsule (50,000 Units total) by mouth every 7 (seven) days. For 12 weeks (Patient taking differently: Take 50,000 Units by mouth every 7 (seven) days. For 12 weeks on Wednesdays started 05/01/14) 4 capsule 3  . acetaminophen (TYLENOL) 500 MG tablet Take 1,000 mg by mouth every 6 (six) hours as needed for mild pain.     No current facility-administered medications on file prior to visit.    Allergies  Allergen Reactions  . Ciprofloxacin Other (See Comments)    Abdominal pain  . Lipitor [Atorvastatin Calcium] Other (See Comments)    Severe muscle pain  . Lisinopril Cough  . Metformin And Related Diarrhea    Review of Systems  Review of Systems  Constitutional: Negative for fever, chills and malaise/fatigue.  HENT: Negative for congestion, hearing loss and nosebleeds.    Eyes: Negative for discharge.  Respiratory: Negative for cough, sputum production, shortness of breath and wheezing.   Cardiovascular: Negative for chest pain, palpitations and leg swelling.  Gastrointestinal: Negative for heartburn, nausea, vomiting, abdominal pain, diarrhea, constipation and blood in stool.  Genitourinary: Negative for dysuria, urgency, frequency and hematuria.  Musculoskeletal: Negative for myalgias, back pain and falls.  Skin: Negative for rash.  Neurological: Negative for dizziness, tremors, sensory change, focal weakness, loss of consciousness, weakness and headaches.  Endo/Heme/Allergies: Negative for polydipsia. Does not bruise/bleed easily.  Psychiatric/Behavioral: Negative for depression  and suicidal ideas. The patient is not nervous/anxious and does not have insomnia.     Objective  BP 132/72 mmHg  Pulse 78  Temp(Src) 98.4 F (36.9 C) (Oral)  Ht 5\' 10"  (1.778 m)  Wt 245 lb 6 oz (111.301 kg)  BMI 35.21 kg/m2  SpO2 95%  Physical Exam  Physical Exam  Constitutional: He is oriented to person, place, and time and well-developed, well-nourished, and in no distress. No distress.  HENT:  Head: Normocephalic and atraumatic.  Eyes: Conjunctivae are normal.  Neck: Neck supple. No thyromegaly present.  Cardiovascular: Normal rate, regular rhythm and normal heart sounds.   No murmur heard. Pulmonary/Chest: Effort normal and breath sounds normal. No respiratory distress.  Abdominal: He exhibits no distension and no mass. There is no tenderness.  Musculoskeletal: He exhibits no edema.  Neurological: He is alert and oriented to person, place, and time.  Skin: Skin is warm.  Psychiatric: Memory, affect and judgment normal.    Lab Results  Component Value Date   TSH 1.19 04/16/2014   Lab Results  Component Value Date   WBC 5.3 05/18/2014   HGB 13.6 05/18/2014   HCT 39.0 05/18/2014   MCV 88.8 05/18/2014   PLT 147* 05/18/2014   Lab Results  Component  Value Date   CREATININE 0.85 05/18/2014   BUN 14 05/18/2014   NA 136 05/18/2014   K 4.0 05/18/2014   CL 103 05/18/2014   CO2 25 05/18/2014   Lab Results  Component Value Date   ALT 55* 04/26/2014   AST 29 04/26/2014   ALKPHOS 101 04/26/2014   BILITOT 0.5 04/26/2014   Lab Results  Component Value Date   CHOL 171 04/16/2014   Lab Results  Component Value Date   HDL 35.70* 04/16/2014   Lab Results  Component Value Date   LDLCALC 106* 12/26/2013   Lab Results  Component Value Date   TRIG 202.0* 04/16/2014   Lab Results  Component Value Date   CHOLHDL 5 04/16/2014     Assessment & Plan  Hypertension Well controlled, no changes to meds. Encouraged heart healthy diet such as the DASH diet and exercise as tolerated.    Diabetes mellitus type 2 in obese hgba1c unacceptable but fsg improving, minimize simple carbs. Increase exercise as tolerated. Continue current meds   Hyperlipidemia Tolerating statin, encouraged heart healthy diet, avoid trans fats, minimize simple carbs and saturated fats. Increase exercise as tolerated   Obesity Encouraged DASH diet, decrease po intake and increase exercise as tolerated. Needs 7-8 hours of sleep nightly. Avoid trans fats, eat small, frequent meals every 4-5 hours with lean proteins, complex carbs and healthy fats. Minimize simple carbs   CAD (coronary artery disease) toleratd stenting of numerous vessels, is feeling well, has follow up appt with cardiology soon. Continue current meds

## 2014-06-10 NOTE — Assessment & Plan Note (Signed)
Tolerating statin, encouraged heart healthy diet, avoid trans fats, minimize simple carbs and saturated fats. Increase exercise as tolerated 

## 2014-06-10 NOTE — Assessment & Plan Note (Signed)
Well controlled, no changes to meds. Encouraged heart healthy diet such as the DASH diet and exercise as tolerated.  °

## 2014-06-10 NOTE — Assessment & Plan Note (Signed)
toleratd stenting of numerous vessels, is feeling well, has follow up appt with cardiology soon. Continue current meds

## 2014-06-10 NOTE — Assessment & Plan Note (Signed)
Encouraged DASH diet, decrease po intake and increase exercise as tolerated. Needs 7-8 hours of sleep nightly. Avoid trans fats, eat small, frequent meals every 4-5 hours with lean proteins, complex carbs and healthy fats. Minimize simple carbs 

## 2014-06-10 NOTE — Assessment & Plan Note (Signed)
hgba1c unacceptable but fsg improving, minimize simple carbs. Increase exercise as tolerated. Continue current meds

## 2014-06-11 ENCOUNTER — Encounter (HOSPITAL_COMMUNITY)
Admission: RE | Admit: 2014-06-11 | Discharge: 2014-06-11 | Disposition: A | Discharge status: Home / Self Care | Payer: Medicare Other | Source: Ambulatory Visit | Attending: Cardiology | Admitting: Cardiology

## 2014-06-11 ENCOUNTER — Telehealth: Payer: Self-pay | Admitting: Family Medicine

## 2014-06-11 ENCOUNTER — Ambulatory Visit
Admission: RE | Admit: 2014-06-11 | Discharge: 2014-06-11 | Disposition: A | Discharge status: Home / Self Care | Payer: Medicare Other | Source: Ambulatory Visit | Attending: Gastroenterology | Admitting: Gastroenterology

## 2014-06-11 ENCOUNTER — Other Ambulatory Visit (INDEPENDENT_AMBULATORY_CARE_PROVIDER_SITE_OTHER): Payer: Medicare Other

## 2014-06-11 DIAGNOSIS — Z7982 Long term (current) use of aspirin: Secondary | ICD-10-CM | POA: Insufficient documentation

## 2014-06-11 DIAGNOSIS — R198 Other specified symptoms and signs involving the digestive system and abdomen: Secondary | ICD-10-CM

## 2014-06-11 DIAGNOSIS — K8020 Calculus of gallbladder without cholecystitis without obstruction: Secondary | ICD-10-CM | POA: Diagnosis not present

## 2014-06-11 DIAGNOSIS — I25110 Atherosclerotic heart disease of native coronary artery with unstable angina pectoris: Secondary | ICD-10-CM | POA: Insufficient documentation

## 2014-06-11 DIAGNOSIS — I10 Essential (primary) hypertension: Secondary | ICD-10-CM | POA: Insufficient documentation

## 2014-06-11 DIAGNOSIS — K631 Perforation of intestine (nontraumatic): Secondary | ICD-10-CM

## 2014-06-11 DIAGNOSIS — K921 Melena: Secondary | ICD-10-CM | POA: Diagnosis not present

## 2014-06-11 DIAGNOSIS — E669 Obesity, unspecified: Secondary | ICD-10-CM | POA: Insufficient documentation

## 2014-06-11 DIAGNOSIS — Z87891 Personal history of nicotine dependence: Secondary | ICD-10-CM | POA: Insufficient documentation

## 2014-06-11 DIAGNOSIS — E119 Type 2 diabetes mellitus without complications: Secondary | ICD-10-CM | POA: Insufficient documentation

## 2014-06-11 DIAGNOSIS — Z794 Long term (current) use of insulin: Secondary | ICD-10-CM | POA: Insufficient documentation

## 2014-06-11 DIAGNOSIS — K760 Fatty (change of) liver, not elsewhere classified: Secondary | ICD-10-CM | POA: Diagnosis not present

## 2014-06-11 DIAGNOSIS — Z79899 Other long term (current) drug therapy: Secondary | ICD-10-CM | POA: Insufficient documentation

## 2014-06-11 DIAGNOSIS — Z87442 Personal history of urinary calculi: Secondary | ICD-10-CM | POA: Insufficient documentation

## 2014-06-11 DIAGNOSIS — I700 Atherosclerosis of aorta: Secondary | ICD-10-CM | POA: Diagnosis not present

## 2014-06-11 DIAGNOSIS — Z955 Presence of coronary angioplasty implant and graft: Secondary | ICD-10-CM | POA: Insufficient documentation

## 2014-06-11 DIAGNOSIS — E785 Hyperlipidemia, unspecified: Secondary | ICD-10-CM | POA: Insufficient documentation

## 2014-06-11 DIAGNOSIS — Z6834 Body mass index (BMI) 34.0-34.9, adult: Secondary | ICD-10-CM | POA: Insufficient documentation

## 2014-06-11 DIAGNOSIS — Z5189 Encounter for other specified aftercare: Secondary | ICD-10-CM | POA: Insufficient documentation

## 2014-06-11 LAB — FECAL OCCULT BLOOD, IMMUNOCHEMICAL: FECAL OCCULT BLD: NEGATIVE

## 2014-06-11 MED ORDER — IOPAMIDOL (ISOVUE-300) INJECTION 61%
125.0000 mL | Freq: Once | INTRAVENOUS | Status: AC | PRN
  Administered 2014-06-11: 125 mL via INTRAVENOUS

## 2014-06-11 NOTE — Telephone Encounter (Signed)
Put order in [facility] for IFOB.

## 2014-06-12 ENCOUNTER — Institutional Professional Consult (permissible substitution): Payer: Medicare Other | Admitting: Interventional Cardiology

## 2014-06-12 ENCOUNTER — Encounter (HOSPITAL_COMMUNITY)
Admission: RE | Admit: 2014-06-12 | Discharge: 2014-06-12 | Disposition: A | Discharge status: Home / Self Care | Payer: Medicare Other | Source: Ambulatory Visit | Attending: Cardiology | Admitting: Cardiology

## 2014-06-12 DIAGNOSIS — Z87891 Personal history of nicotine dependence: Secondary | ICD-10-CM | POA: Diagnosis not present

## 2014-06-12 DIAGNOSIS — Z7982 Long term (current) use of aspirin: Secondary | ICD-10-CM | POA: Diagnosis not present

## 2014-06-12 DIAGNOSIS — Z79899 Other long term (current) drug therapy: Secondary | ICD-10-CM | POA: Diagnosis not present

## 2014-06-12 DIAGNOSIS — Z87442 Personal history of urinary calculi: Secondary | ICD-10-CM | POA: Diagnosis not present

## 2014-06-12 DIAGNOSIS — E119 Type 2 diabetes mellitus without complications: Secondary | ICD-10-CM | POA: Diagnosis not present

## 2014-06-12 DIAGNOSIS — Z955 Presence of coronary angioplasty implant and graft: Secondary | ICD-10-CM | POA: Diagnosis not present

## 2014-06-12 DIAGNOSIS — E669 Obesity, unspecified: Secondary | ICD-10-CM | POA: Diagnosis not present

## 2014-06-12 DIAGNOSIS — Z6834 Body mass index (BMI) 34.0-34.9, adult: Secondary | ICD-10-CM | POA: Diagnosis not present

## 2014-06-12 DIAGNOSIS — Z5189 Encounter for other specified aftercare: Secondary | ICD-10-CM | POA: Diagnosis not present

## 2014-06-12 DIAGNOSIS — E785 Hyperlipidemia, unspecified: Secondary | ICD-10-CM | POA: Diagnosis not present

## 2014-06-12 DIAGNOSIS — I25110 Atherosclerotic heart disease of native coronary artery with unstable angina pectoris: Secondary | ICD-10-CM | POA: Diagnosis not present

## 2014-06-12 DIAGNOSIS — Z794 Long term (current) use of insulin: Secondary | ICD-10-CM | POA: Diagnosis not present

## 2014-06-12 DIAGNOSIS — I10 Essential (primary) hypertension: Secondary | ICD-10-CM | POA: Diagnosis not present

## 2014-06-12 LAB — GLUCOSE, CAPILLARY: GLUCOSE-CAPILLARY: 126 mg/dL — AB (ref 70–99)

## 2014-06-12 NOTE — Progress Notes (Addendum)
Pt started cardiac rehab today.  Pt tolerated light exercise without difficulty. VSS, telemetry-sinus rhythm, RBBB, asymptomatic.  PHQ-0. Pt exhibits positive coping skills, hopeful outlook with supportive family and strong faith base. 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  pt verbalizes some frustration and disbelief with his recent cardiac event.  He was surprised to be diagnosed with CAD.  His wife had a life threatening illness a few years ago which is still somewhat troubling to him, although he is very happy and amazed she survived with recovery being prolonged and difficult.   Pt cardiac rehab goals are to lose weight and learn lifestyle modifications to reduce progression of CAD.  Pt oriented to exercise equipment and routine.  Understanding verbalized.

## 2014-06-13 LAB — GLUCOSE, CAPILLARY: Glucose-Capillary: 132 mg/dL — ABNORMAL HIGH (ref 70–99)

## 2014-06-14 ENCOUNTER — Encounter (HOSPITAL_COMMUNITY)
Admission: RE | Admit: 2014-06-14 | Discharge: 2014-06-14 | Disposition: A | Discharge status: Home / Self Care | Payer: Medicare Other | Source: Ambulatory Visit | Attending: Cardiology | Admitting: Cardiology

## 2014-06-14 DIAGNOSIS — Z87442 Personal history of urinary calculi: Secondary | ICD-10-CM | POA: Diagnosis not present

## 2014-06-14 DIAGNOSIS — I10 Essential (primary) hypertension: Secondary | ICD-10-CM | POA: Diagnosis not present

## 2014-06-14 DIAGNOSIS — Z794 Long term (current) use of insulin: Secondary | ICD-10-CM | POA: Diagnosis not present

## 2014-06-14 DIAGNOSIS — Z7982 Long term (current) use of aspirin: Secondary | ICD-10-CM | POA: Diagnosis not present

## 2014-06-14 DIAGNOSIS — Z5189 Encounter for other specified aftercare: Secondary | ICD-10-CM | POA: Diagnosis not present

## 2014-06-14 DIAGNOSIS — Z955 Presence of coronary angioplasty implant and graft: Secondary | ICD-10-CM | POA: Diagnosis not present

## 2014-06-14 DIAGNOSIS — I25110 Atherosclerotic heart disease of native coronary artery with unstable angina pectoris: Secondary | ICD-10-CM | POA: Insufficient documentation

## 2014-06-14 DIAGNOSIS — Z79899 Other long term (current) drug therapy: Secondary | ICD-10-CM | POA: Diagnosis not present

## 2014-06-14 DIAGNOSIS — E785 Hyperlipidemia, unspecified: Secondary | ICD-10-CM | POA: Insufficient documentation

## 2014-06-14 DIAGNOSIS — Z6834 Body mass index (BMI) 34.0-34.9, adult: Secondary | ICD-10-CM | POA: Diagnosis not present

## 2014-06-14 DIAGNOSIS — E669 Obesity, unspecified: Secondary | ICD-10-CM | POA: Insufficient documentation

## 2014-06-14 DIAGNOSIS — Z87891 Personal history of nicotine dependence: Secondary | ICD-10-CM | POA: Insufficient documentation

## 2014-06-14 DIAGNOSIS — E119 Type 2 diabetes mellitus without complications: Secondary | ICD-10-CM | POA: Diagnosis not present

## 2014-06-14 LAB — GLUCOSE, CAPILLARY: Glucose-Capillary: 110 mg/dL — ABNORMAL HIGH (ref 70–99)

## 2014-06-17 ENCOUNTER — Encounter (HOSPITAL_COMMUNITY)
Admission: RE | Admit: 2014-06-17 | Discharge: 2014-06-17 | Disposition: A | Discharge status: Home / Self Care | Payer: Medicare Other | Source: Ambulatory Visit | Attending: Cardiology | Admitting: Cardiology

## 2014-06-17 DIAGNOSIS — E119 Type 2 diabetes mellitus without complications: Secondary | ICD-10-CM | POA: Diagnosis not present

## 2014-06-17 DIAGNOSIS — I10 Essential (primary) hypertension: Secondary | ICD-10-CM | POA: Diagnosis not present

## 2014-06-17 DIAGNOSIS — Z955 Presence of coronary angioplasty implant and graft: Secondary | ICD-10-CM | POA: Diagnosis not present

## 2014-06-17 DIAGNOSIS — I25110 Atherosclerotic heart disease of native coronary artery with unstable angina pectoris: Secondary | ICD-10-CM | POA: Diagnosis not present

## 2014-06-17 DIAGNOSIS — Z5189 Encounter for other specified aftercare: Secondary | ICD-10-CM | POA: Diagnosis not present

## 2014-06-17 DIAGNOSIS — E785 Hyperlipidemia, unspecified: Secondary | ICD-10-CM | POA: Diagnosis not present

## 2014-06-17 LAB — GLUCOSE, CAPILLARY: Glucose-Capillary: 191 mg/dL — ABNORMAL HIGH (ref 70–99)

## 2014-06-18 LAB — GLUCOSE, CAPILLARY
Glucose-Capillary: 111 mg/dL — ABNORMAL HIGH (ref 70–99)
Glucose-Capillary: 106 mg/dL — ABNORMAL HIGH (ref 70–99)

## 2014-06-19 ENCOUNTER — Encounter (HOSPITAL_COMMUNITY)
Admission: RE | Admit: 2014-06-19 | Discharge: 2014-06-19 | Disposition: A | Discharge status: Home / Self Care | Payer: Medicare Other | Source: Ambulatory Visit | Attending: Cardiology | Admitting: Cardiology

## 2014-06-19 DIAGNOSIS — I10 Essential (primary) hypertension: Secondary | ICD-10-CM | POA: Diagnosis not present

## 2014-06-19 DIAGNOSIS — I25110 Atherosclerotic heart disease of native coronary artery with unstable angina pectoris: Secondary | ICD-10-CM | POA: Diagnosis not present

## 2014-06-19 DIAGNOSIS — E119 Type 2 diabetes mellitus without complications: Secondary | ICD-10-CM | POA: Diagnosis not present

## 2014-06-19 DIAGNOSIS — Z5189 Encounter for other specified aftercare: Secondary | ICD-10-CM | POA: Diagnosis not present

## 2014-06-19 DIAGNOSIS — E785 Hyperlipidemia, unspecified: Secondary | ICD-10-CM | POA: Diagnosis not present

## 2014-06-19 DIAGNOSIS — Z955 Presence of coronary angioplasty implant and graft: Secondary | ICD-10-CM | POA: Diagnosis not present

## 2014-06-19 LAB — GLUCOSE, CAPILLARY
Glucose-Capillary: 118 mg/dL — ABNORMAL HIGH (ref 70–99)
Glucose-Capillary: 120 mg/dL — ABNORMAL HIGH (ref 70–99)

## 2014-06-20 ENCOUNTER — Ambulatory Visit: Payer: Medicare Other | Admitting: Endocrinology

## 2014-06-21 ENCOUNTER — Encounter (HOSPITAL_COMMUNITY)
Admission: RE | Admit: 2014-06-21 | Discharge: 2014-06-21 | Disposition: A | Discharge status: Home / Self Care | Payer: Medicare Other | Source: Ambulatory Visit | Attending: Cardiology | Admitting: Cardiology

## 2014-06-21 DIAGNOSIS — L570 Actinic keratosis: Secondary | ICD-10-CM | POA: Diagnosis not present

## 2014-06-21 DIAGNOSIS — L821 Other seborrheic keratosis: Secondary | ICD-10-CM | POA: Diagnosis not present

## 2014-06-21 DIAGNOSIS — I8310 Varicose veins of unspecified lower extremity with inflammation: Secondary | ICD-10-CM | POA: Diagnosis not present

## 2014-06-21 NOTE — Progress Notes (Signed)
Reviewed home exercise guidelines with patient including endpoints, temperature precautions, target heart rate and rate of perceived exertion. Pt plans to walk for 30 to 45 minutes outside each day as his mode of home exercise. Pt voices understanding of instructions given.  [place] Academic Intern Sol Passer, MS, ACSM CCEP

## 2014-06-24 ENCOUNTER — Encounter (HOSPITAL_COMMUNITY)
Admission: RE | Admit: 2014-06-24 | Discharge: 2014-06-24 | Disposition: A | Discharge status: Home / Self Care | Payer: Medicare Other | Source: Ambulatory Visit | Attending: Cardiology | Admitting: Cardiology

## 2014-06-24 DIAGNOSIS — Z955 Presence of coronary angioplasty implant and graft: Secondary | ICD-10-CM | POA: Diagnosis not present

## 2014-06-24 DIAGNOSIS — I10 Essential (primary) hypertension: Secondary | ICD-10-CM | POA: Diagnosis not present

## 2014-06-24 DIAGNOSIS — Z5189 Encounter for other specified aftercare: Secondary | ICD-10-CM | POA: Diagnosis not present

## 2014-06-24 DIAGNOSIS — I25110 Atherosclerotic heart disease of native coronary artery with unstable angina pectoris: Secondary | ICD-10-CM | POA: Diagnosis not present

## 2014-06-24 DIAGNOSIS — E119 Type 2 diabetes mellitus without complications: Secondary | ICD-10-CM | POA: Diagnosis not present

## 2014-06-24 DIAGNOSIS — E785 Hyperlipidemia, unspecified: Secondary | ICD-10-CM | POA: Diagnosis not present

## 2014-06-26 ENCOUNTER — Encounter (HOSPITAL_COMMUNITY)
Admission: RE | Admit: 2014-06-26 | Discharge: 2014-06-26 | Disposition: A | Discharge status: Home / Self Care | Payer: Medicare Other | Source: Ambulatory Visit | Attending: Cardiology | Admitting: Cardiology

## 2014-06-26 DIAGNOSIS — E785 Hyperlipidemia, unspecified: Secondary | ICD-10-CM | POA: Diagnosis not present

## 2014-06-26 DIAGNOSIS — E119 Type 2 diabetes mellitus without complications: Secondary | ICD-10-CM | POA: Diagnosis not present

## 2014-06-26 DIAGNOSIS — I25110 Atherosclerotic heart disease of native coronary artery with unstable angina pectoris: Secondary | ICD-10-CM | POA: Diagnosis not present

## 2014-06-26 DIAGNOSIS — I10 Essential (primary) hypertension: Secondary | ICD-10-CM | POA: Diagnosis not present

## 2014-06-26 DIAGNOSIS — Z955 Presence of coronary angioplasty implant and graft: Secondary | ICD-10-CM | POA: Diagnosis not present

## 2014-06-26 DIAGNOSIS — Z5189 Encounter for other specified aftercare: Secondary | ICD-10-CM | POA: Diagnosis not present

## 2014-06-26 NOTE — Progress Notes (Signed)
QUALITY OF LIFE REVIEW Pt quality of life scores are overall normal with low marks in overall health. Pt recent coronary artery disease diagnosis has been hard for him to admit and accept since he had few symptoms prior to his diagnosis and was able to participate in his usual activities without difficulty. Pt admits he has a great deal to learn about heart healthy living and risk factor reduction. Pt is committed to active participation in cardiac rehab.  Pt offered emotional support and reassurance.

## 2014-06-28 ENCOUNTER — Encounter (HOSPITAL_COMMUNITY)
Admission: RE | Admit: 2014-06-28 | Discharge: 2014-06-28 | Disposition: A | Discharge status: Home / Self Care | Payer: Medicare Other | Source: Ambulatory Visit | Attending: Cardiology | Admitting: Cardiology

## 2014-06-28 DIAGNOSIS — E119 Type 2 diabetes mellitus without complications: Secondary | ICD-10-CM | POA: Diagnosis not present

## 2014-06-28 DIAGNOSIS — I10 Essential (primary) hypertension: Secondary | ICD-10-CM | POA: Diagnosis not present

## 2014-06-28 DIAGNOSIS — Z5189 Encounter for other specified aftercare: Secondary | ICD-10-CM | POA: Diagnosis not present

## 2014-06-28 DIAGNOSIS — E785 Hyperlipidemia, unspecified: Secondary | ICD-10-CM | POA: Diagnosis not present

## 2014-06-28 DIAGNOSIS — Z955 Presence of coronary angioplasty implant and graft: Secondary | ICD-10-CM | POA: Diagnosis not present

## 2014-06-28 DIAGNOSIS — I25110 Atherosclerotic heart disease of native coronary artery with unstable angina pectoris: Secondary | ICD-10-CM | POA: Diagnosis not present

## 2014-06-28 LAB — GLUCOSE, CAPILLARY: Glucose-Capillary: 127 mg/dL — ABNORMAL HIGH (ref 70–99)

## 2014-07-01 ENCOUNTER — Encounter: Payer: Self-pay | Admitting: *Deleted

## 2014-07-01 ENCOUNTER — Encounter (HOSPITAL_COMMUNITY)
Admission: RE | Admit: 2014-07-01 | Discharge: 2014-07-01 | Disposition: A | Discharge status: Home / Self Care | Payer: Medicare Other | Source: Ambulatory Visit | Attending: Cardiology | Admitting: Cardiology

## 2014-07-01 ENCOUNTER — Telehealth: Payer: Self-pay | Admitting: *Deleted

## 2014-07-01 DIAGNOSIS — I25110 Atherosclerotic heart disease of native coronary artery with unstable angina pectoris: Secondary | ICD-10-CM | POA: Diagnosis not present

## 2014-07-01 DIAGNOSIS — E119 Type 2 diabetes mellitus without complications: Secondary | ICD-10-CM | POA: Diagnosis not present

## 2014-07-01 DIAGNOSIS — Z5189 Encounter for other specified aftercare: Secondary | ICD-10-CM | POA: Diagnosis not present

## 2014-07-01 DIAGNOSIS — Z955 Presence of coronary angioplasty implant and graft: Secondary | ICD-10-CM | POA: Diagnosis not present

## 2014-07-01 DIAGNOSIS — I10 Essential (primary) hypertension: Secondary | ICD-10-CM | POA: Diagnosis not present

## 2014-07-01 DIAGNOSIS — E785 Hyperlipidemia, unspecified: Secondary | ICD-10-CM | POA: Diagnosis not present

## 2014-07-01 NOTE — Telephone Encounter (Signed)
Pre-Visit Call completed with patient and chart updated.   Pre-Visit Info documented in Specialty Comments under [employer].    

## 2014-07-02 ENCOUNTER — Ambulatory Visit (INDEPENDENT_AMBULATORY_CARE_PROVIDER_SITE_OTHER): Payer: Medicare Other | Admitting: Family Medicine

## 2014-07-02 ENCOUNTER — Encounter: Payer: Self-pay | Admitting: Family Medicine

## 2014-07-02 VITALS — BP 120/62 | HR 76 | Temp 98.0°F | Ht 70.0 in | Wt 250.2 lb

## 2014-07-02 DIAGNOSIS — E1169 Type 2 diabetes mellitus with other specified complication: Secondary | ICD-10-CM

## 2014-07-02 DIAGNOSIS — I2510 Atherosclerotic heart disease of native coronary artery without angina pectoris: Secondary | ICD-10-CM | POA: Diagnosis not present

## 2014-07-02 DIAGNOSIS — Z0000 Encounter for general adult medical examination without abnormal findings: Secondary | ICD-10-CM

## 2014-07-02 DIAGNOSIS — E669 Obesity, unspecified: Secondary | ICD-10-CM

## 2014-07-02 DIAGNOSIS — I10 Essential (primary) hypertension: Secondary | ICD-10-CM

## 2014-07-02 DIAGNOSIS — E119 Type 2 diabetes mellitus without complications: Secondary | ICD-10-CM

## 2014-07-02 DIAGNOSIS — E785 Hyperlipidemia, unspecified: Secondary | ICD-10-CM

## 2014-07-02 DIAGNOSIS — H9313 Tinnitus, bilateral: Secondary | ICD-10-CM

## 2014-07-02 DIAGNOSIS — D696 Thrombocytopenia, unspecified: Secondary | ICD-10-CM

## 2014-07-02 DIAGNOSIS — E559 Vitamin D deficiency, unspecified: Secondary | ICD-10-CM | POA: Insufficient documentation

## 2014-07-02 DIAGNOSIS — R3911 Hesitancy of micturition: Secondary | ICD-10-CM

## 2014-07-02 HISTORY — DX: Vitamin D deficiency, unspecified: E55.9

## 2014-07-02 NOTE — Progress Notes (Signed)
Michael Cunningham  [id number] [birth year] 07/02/2014      Progress Note-Follow Up  Subjective  Chief Complaint  Chief Complaint  Patient presents with  . Follow-up    medicare wellness    HPI  Patient is a 69 y.o. male in today for routine medical care. Patient feeling well today but recently hospitalized for significant coronary artery disease requiring stenting. He had had an episode of chest pain while shoveling snow and testing revealed severe occlusions. Today he feels well. His blood pressures have been well-controlled. His blood sugars have been ranging from 80s to 180s but generally however around 140. He has had 2 episodes of feeling shaky and lightheaded during the night mildly diaphoretic when this occurs. Denies CP/palp/SOB/HA/congestion/fevers/GI or GU c/o. Taking meds as prescribed  Past Medical History  Diagnosis Date  . Hypertension   . Multiple lipomas     hx. mulitple and some remains -arms, legs  . Diverticulosis of colon     LEFT SIDE  . History of small bowel obstruction     X2  28 YRS AGO AND LAST ONE 04/2014--  RESOLVED WITHOUT SURGICAL INTERVENTION  . Type 2 diabetes mellitus   . Left ureteral calculus   . Complication of anesthesia     11'12 Surgery with excrutiating "head about to explode" upon awakening  -lasted several hours postop  ( SURGERY DONE 08-13-2011 AT [facility] PT DID OK POST OP)  . Measles =  . Hyperlipidemia   . Recurrent kidney stones 07/08/2013    Follows with Dr Gaynelle Arabian He believes they are Calcium oxalate stones  . Obesity, unspecified 09/18/2013  . CAD (coronary artery disease)     a. 05/2014 [place] s/p DES x4 to mid-distLAD and DESx1 to DI  . Vitamin D deficiency 07/02/2014    Past Surgical History  Procedure Laterality Date  . Lipoma excision  1975    multiple, 10-12  . Knee arthroscopy  08/13/2011    right ARTHROSCOPY KNEE;  Surgeon: Gearlean Alf, MD;  Location: [facility];  Service: Orthopedics;  Laterality: Right;  medial  meniscal  debridement, excision of plica, synovectomy  . Knee arthroscopy w/ meniscectomy Right 01-29-2011    twice, first trimmed mediscus and cyst  . Colonoscopy w/ polypectomy  LAST ONE 2013  . Knee arthroscopy Left 11/16/2013    Procedure: LEFT KNEE ARTHROSCOPY MEDIAL MENISCAL DEBRIDEMENT, CHONDROPLASTY;  Surgeon: Gearlean Alf, MD;  Location: [facility];  Service: Orthopedics;  Laterality: Left;  . Coronary stent placement  05/17/2014    PROXIMAL X 4  MID & DISTAL LAD  DIAGONAL   . Left heart catheterization with coronary angiogram N/A 05/17/2014    Procedure: LEFT HEART CATHETERIZATION WITH CORONARY ANGIOGRAM;  Surgeon: Burnell Blanks, MD;  Location: [facility] CATH LAB;  Service: Cardiovascular;  Laterality: N/A;  . Percutaneous coronary stent intervention (pci-s)  05/17/2014    Procedure: PERCUTANEOUS CORONARY STENT INTERVENTION (PCI-S);  Surgeon: Burnell Blanks, MD;  Location: [facility] CATH LAB;  Service: Cardiovascular;;  Diag1 and Prox to Distal LAD    Family History  Problem Relation Age of Onset  . Hypertension Mother   . Cancer Mother 67    ovarian  . Heart disease Father     CHF  . Heart attack Father 25  . Cancer Sister     breast- just finished her last radiation  . Heart disease Maternal Grandfather     ?  [name] Heart attack Paternal Grandfather 82  . Diabetes Neg Hx   .  Heart attack Paternal Uncle 52    Sudden death    History   Social History  . Marital Status: Married    Spouse Name: N/A  . Number of Children: 2  . Years of Education: N/A   Occupational History  . Not on file.   Social History Main Topics  . Smoking status: Former Smoker -- 1.00 packs/day for 4 years    Types: Cigarettes    Quit date: 06/14/1967  . Smokeless tobacco: Never Used  . Alcohol Use: No  . Drug Use: No  . Sexual Activity: Yes     Comment: lives with wife   Other Topics Concern  . 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       Current Outpatient Prescriptions on File Prior to Visit  Medication Sig Dispense Refill  . acetaminophen (TYLENOL) 500 MG tablet Take 1,000 mg by mouth every 6 (six) hours as needed for mild pain.    [name] aspirin EC 81 MG tablet Take 81 mg by mouth daily with breakfast.     . HUMULIN N 100 UNIT/ML injection INJECT 35 UNITS SUBCUTANEOUSLY AT BEDTIME EVERY NIGHT 20 mL 0  . insulin regular (HUMULIN R) 100 units/mL injection 3 times a day (just before each meal), 40-30-45 units. (Patient taking differently: Inject 30-45 Units into the skin 3 (three) times daily before meals. Inject 40 units before breakfast, 30 units before lunch and 45 units before supper) 40 mL 11  . Insulin Syringe-Needle U-100 (RELION INSULIN SYR 0.5ML/31G) 31G X 5/16" 0.5 ML MISC USE SYRINGES TO INJECT INSULIN 4 TIMES DAILY 200 each 11  . losartan (COZAAR) 50 MG tablet Take 1.5 tablets (75 mg total) by mouth daily. 1 1/2 tablets every morning 180 tablet 0  . metoprolol tartrate (LOPRESSOR) 25 MG tablet Take 1 tablet (25 mg total) by mouth 2 (two) times daily. 60 tablet 11  . Multiple Vitamin (MULTIVITAMIN WITH MINERALS) TABS tablet Take 1 tablet by mouth daily.    . nitroGLYCERIN (NITROSTAT) 0.4 MG SL tablet Place 1 tablet (0.4 mg total) under the tongue every 5 (five) minutes as needed for chest pain. 90 tablet 3  . prasugrel (EFFIENT) 10 MG TABS tablet Take 1 tablet (10 mg total) by mouth daily. 30 tablet 11  . simvastatin (ZOCOR) 40 MG tablet Take 1 tablet (40 mg total) by mouth at bedtime. 90 tablet 1  . Vitamin D, Ergocalciferol, (DRISDOL) 50000 UNITS CAPS capsule Take 1 capsule (50,000 Units total) by mouth every 7 (seven) days. For 12 weeks (Patient taking differently: Take 50,000 Units by mouth every 7 (seven) days. For 12 weeks on Wednesdays started 05/01/14) 4 capsule 3   No current facility-administered medications on file prior to visit.    Allergies  Allergen Reactions  . Ciprofloxacin Other (See Comments)     Abdominal pain  . Lipitor [Atorvastatin Calcium] Other (See Comments)    Severe muscle pain  . Lisinopril Cough  . Metformin And Related Diarrhea   Medication: Reviewed with patient and updated   Preferred Pharmacy: [place], [place] verified: UTD   Immunization Status: UTD  Flu vaccine-- 01/10/14 Tdap-- 05/21/09 PNA-- 01/10/14 (13)  Shingles-- 06/06/09   A/P:  Changes to [place], [place] or Personal Hx: updated  CCS: 07/14/11 at [place]- normal per pt (hx of SBO x 2 )  PSA: none found  Bone Density: none found  Eye exam: 10/02/13 with Luberta Mutter, MD at [facility] Ophthalmology - mild retinopathy Foot exam- 03/21/14   Care Teams Updated: Minus Breeding, MD- Cardiology, Philemon Kingdom, MD- Endo, Rickard Rhymes, [place]- Dermatology   ED/Hospital/Urgent Care Visits: Hospital admission with cardiac cath 05/17/14  To Discuss with Provider: No concerns at time of phone call  Review of Systems  Review of Systems  Constitutional: Negative for fever, chills and malaise/fatigue.  HENT: Negative for congestion, hearing loss and nosebleeds.   Eyes: Negative for discharge.  Respiratory: Negative for cough, sputum production, shortness of breath and wheezing.   Cardiovascular: Negative for chest pain, palpitations and leg swelling.  Gastrointestinal: Negative for heartburn, nausea, vomiting, abdominal pain, diarrhea, constipation and blood in stool.  Genitourinary: Negative for dysuria, urgency, frequency and hematuria.  Musculoskeletal: Positive for joint pain. Negative for myalgias, back pain and falls.  Skin: Negative for rash.  Neurological: Negative for dizziness, tremors, sensory change, focal weakness, loss of consciousness, weakness and headaches.  Endo/Heme/Allergies: Negative for polydipsia. Does not bruise/bleed easily.  Psychiatric/Behavioral: Negative for depression and suicidal ideas. The patient is not  nervous/anxious and does not have insomnia.     Objective  BP 120/62 mmHg  Pulse 76  Temp(Src) 98 F (36.7 C) (Oral)  Ht 5\' 10"  (1.778 m)  Wt 250 lb 4 oz (113.513 kg)  BMI 35.91 kg/m2  SpO2 95%  Physical Exam  Physical Exam  Constitutional: He is oriented to person, place, and time and well-developed, well-nourished, and in no distress. No distress.  HENT:  Head: Normocephalic and atraumatic.  Eyes: Conjunctivae are normal.  Neck: Neck supple. No thyromegaly present.  Cardiovascular: Normal rate, regular rhythm and normal heart sounds.   Pulmonary/Chest: Effort normal and breath sounds normal. No respiratory distress.  Abdominal: He exhibits no distension and no mass. There is no tenderness.  Musculoskeletal: He exhibits no edema.  Neurological: He is alert and oriented to person, place, and time.  Skin: Skin is warm.  Psychiatric: Memory, affect and judgment normal.    Lab Results  Component Value Date   TSH 1.19 04/16/2014   Lab Results  Component Value Date   WBC 5.3 05/18/2014   HGB 13.6 05/18/2014   HCT 39.0 05/18/2014   MCV 88.8 05/18/2014   PLT 147* 05/18/2014   Lab Results  Component Value Date   CREATININE 0.85 05/18/2014   BUN 14 05/18/2014   NA 136 05/18/2014   K 4.0 05/18/2014   CL 103 05/18/2014   CO2 25 05/18/2014   Lab Results  Component Value Date   ALT 55* 04/26/2014   AST 29 04/26/2014   ALKPHOS 101 04/26/2014   BILITOT 0.5 04/26/2014   Lab Results  Component Value Date   CHOL 171 04/16/2014   Lab Results  Component Value Date   HDL 35.70* 04/16/2014   Lab Results  Component Value Date   LDLCALC 106* 12/26/2013   Lab Results  Component Value Date   TRIG 202.0* 04/16/2014   Lab Results  Component Value Date   CHOLHDL 5 04/16/2014     Assessment & Plan  Hypertension Well controlled, no changes to meds. Encouraged heart healthy diet such as the DASH diet and exercise as tolerated.    Diabetes mellitus type 2 in  obese hgba1c , minimize simple carbs. Increase exercise as tolerated. Continue current meds   Hyperlipidemia Tolerating statin, encouraged heart healthy diet, avoid trans fats, minimize simple carbs and saturated fats. Increase exercise as tolerated  Obesity Encouraged DASH diet, decrease po intake and increase exercise as tolerated. Needs 7-8 hours of sleep nightly. Avoid trans fats, eat small, frequent meals every 4-5 hours with lean proteins, complex carbs and healthy fats. Minimize simple carbs   Thrombocytopenia New, very mild, asymptomatic, will monitor   Vitamin D deficiency Continue supplements weekly and reassess with next blood draw   Medicare annual wellness visit, subsequent Patient denies any difficulties at home. No trouble with ADLs, depression or falls. No recent changes to vision or hearing. Is UTD with immunizations. Is UTD with screening. Discussed Advanced Directives, patient agrees to bring [place] copies of documents if can. Encouraged heart healthy diet, exercise as tolerated and adequate sleep. Labs reviewed. Sees cardiology Dr Percival Spanish, Dr Angelena Form Sees opthamology Dr Maricela Curet, follows with [facility] endocrinology Last colonoscopy 2013 per patient. See problem list for patient risk factors See AVS for schedule for preventative health measures.

## 2014-07-02 NOTE — Progress Notes (Signed)
Pre visit review using our clinic review tool, if applicable. No additional management support is needed unless otherwise documented below in the visit note. 

## 2014-07-02 NOTE — Patient Instructions (Addendum)
Salon pas gel to hands  Probiotic daily such as Digestive Advantage or Hardin Negus Colon health 64 oz of clear fluids Fiber such as metamucil or benefiber   Preventive Care for Adults A healthy lifestyle and preventive care can promote health and wellness. Preventive health guidelines for men include the following key practices:  A routine yearly physical is a good way to check with your health care provider about your health and preventative screening. It is a chance to share any concerns and updates on your health and to receive a thorough exam.  Visit your dentist for a routine exam and preventative care every 6 months. Brush your teeth twice a day and floss once a day. Good oral hygiene prevents tooth decay and gum disease.  The frequency of eye exams is based on your age, health, family medical history, use of contact lenses, and other factors. Follow your health care provider's recommendations for frequency of eye exams.  Eat a healthy diet. Foods such as vegetables, fruits, whole grains, low-fat dairy products, and lean protein foods contain the nutrients you need without too many calories. Decrease your intake of foods high in solid fats, added sugars, and salt. Eat the right amount of calories for you.Get information about a proper diet from your health care provider, if necessary.  Regular physical exercise is one of the most important things you can do for your health. Most adults should get at least 150 minutes of moderate-intensity exercise (any activity that increases your heart rate and causes you to sweat) each week. In addition, most adults need muscle-strengthening exercises on 2 or more days a week.  Maintain a healthy weight. The body mass index (BMI) is a screening tool to identify possible weight problems. It provides an estimate of body fat based on height and weight. Your health care provider can find your BMI and can help you achieve or maintain a healthy weight.For adults  20 years and older:  A BMI below 18.5 is considered underweight.  A BMI of 18.5 to 24.9 is normal.  A BMI of 25 to 29.9 is considered overweight.  A BMI of 30 and above is considered obese.  Maintain normal blood lipids and cholesterol levels by exercising and minimizing your intake of saturated fat. Eat a balanced diet with plenty of fruit and vegetables. Blood tests for lipids and cholesterol should begin at age 102 and be repeated every 5 years. If your lipid or cholesterol levels are high, you are over 50, or you are at high risk for heart disease, you may need your cholesterol levels checked more frequently.Ongoing high lipid and cholesterol levels should be treated with medicines if diet and exercise are not working.  If you smoke, find out from your health care provider how to quit. If you do not use tobacco, do not start.  Lung cancer screening is recommended for adults aged 29-80 years who are at high risk for developing lung cancer because of a history of smoking. A yearly low-dose CT scan of the lungs is recommended for people who have at least a 30-pack-year history of smoking and are a current smoker or have quit within the past 15 years. A pack year of smoking is smoking an average of 1 pack of cigarettes a day for 1 year (for example: 1 pack a day for 30 years or 2 packs a day for 15 years). Yearly screening should continue until the smoker has stopped smoking for at least 15 years. Yearly screening should be  stopped for people who develop a health problem that would prevent them from having lung cancer treatment.  If you choose to drink alcohol, do not have more than 2 drinks per day. One drink is considered to be 12 ounces (355 mL) of beer, 5 ounces (148 mL) of wine, or 1.5 ounces (44 mL) of liquor.  Avoid use of street drugs. Do not share needles with anyone. Ask for help if you need support or instructions about stopping the use of drugs.  High blood pressure causes heart  disease and increases the risk of stroke. Your blood pressure should be checked at least every 1-2 years. Ongoing high blood pressure should be treated with medicines, if weight loss and exercise are not effective.  If you are 52-42 years old, ask your health care provider if you should take aspirin to prevent heart disease.  Diabetes screening involves taking a blood sample to check your fasting blood sugar level. This should be done once every 3 years, after age 64, if you are within normal weight and without risk factors for diabetes. Testing should be considered at a younger age or be carried out more frequently if you are overweight and have at least 1 risk factor for diabetes.  Colorectal cancer can be detected and often prevented. Most routine colorectal cancer screening begins at the age of 37 and continues through age 76. However, your health care provider may recommend screening at an earlier age if you have risk factors for colon cancer. On a yearly basis, your health care provider may provide home test kits to check for hidden blood in the stool. Use of a small camera at the end of a tube to directly examine the colon (sigmoidoscopy or colonoscopy) can detect the earliest forms of colorectal cancer. Talk to your health care provider about this at age 38, when routine screening begins. Direct exam of the colon should be repeated every 5-10 years through age 24, unless early forms of precancerous polyps or small growths are found.  People who are at an increased risk for hepatitis B should be screened for this virus. You are considered at high risk for hepatitis B if:  You were born in a country where hepatitis B occurs often. Talk with your health care provider about which countries are considered high risk.  Your parents were born in a high-risk country and you have not received a shot to protect against hepatitis B (hepatitis B vaccine).  You have HIV or AIDS.  You use needles to inject  street drugs.  You live with, or have sex with, someone who has hepatitis B.  You are a man who has sex with other men (MSM).  You get hemodialysis treatment.  You take certain medicines for conditions such as cancer, organ transplantation, and autoimmune conditions.  Hepatitis C blood testing is recommended for all people born from [birth year] through 1965 and any individual with known risks for hepatitis C.  Practice safe sex. Use condoms and avoid high-risk sexual practices to reduce the spread of sexually transmitted infections (STIs). STIs include gonorrhea, chlamydia, syphilis, trichomonas, herpes, HPV, and human immunodeficiency virus (HIV). Herpes, HIV, and HPV are viral illnesses that have no cure. They can result in disability, cancer, and death.  If you are at risk of being infected with HIV, it is recommended that you take a prescription medicine daily to prevent HIV infection. This is called preexposure prophylaxis (PrEP). You are considered at risk if:  You are a man  who has sex with other men (MSM) and have other risk factors.  You are a heterosexual man, are sexually active, and are at increased risk for HIV infection.  You take drugs by injection.  You are sexually active with a partner who has HIV.  Talk with your health care provider about whether you are at high risk of being infected with HIV. If you choose to begin PrEP, you should first be tested for HIV. You should then be tested every 3 months for as long as you are taking PrEP.  A one-time screening for abdominal aortic aneurysm (AAA) and surgical repair of large AAAs by ultrasound are recommended for men ages 10 to 78 years who are current or former smokers.  Healthy men should no longer receive prostate-specific antigen (PSA) blood tests as part of routine cancer screening. Talk with your health care provider about prostate cancer screening.  Testicular cancer screening is not recommended for adult males who have no  symptoms. Screening includes self-exam, a health care provider exam, and other screening tests. Consult with your health care provider about any symptoms you have or any concerns you have about testicular cancer.  Use sunscreen. Apply sunscreen liberally and repeatedly throughout the day. You should seek shade when your shadow is shorter than you. Protect yourself by wearing long sleeves, pants, a wide-brimmed hat, and sunglasses year round, whenever you are outdoors.  Once a month, do a whole-body skin exam, using a mirror to look at the skin on your back. Tell your health care provider about new moles, moles that have irregular borders, moles that are larger than a pencil eraser, or moles that have changed in shape or color.  Stay current with required vaccines (immunizations).  Influenza vaccine. All adults should be immunized every year.  Tetanus, diphtheria, and acellular pertussis (Td, Tdap) vaccine. An adult who has not previously received Tdap or who does not know his vaccine status should receive 1 dose of Tdap. This initial dose should be followed by tetanus and diphtheria toxoids (Td) booster doses every 10 years. Adults with an unknown or incomplete history of completing a 3-dose immunization series with Td-containing vaccines should begin or complete a primary immunization series including a Tdap dose. Adults should receive a Td booster every 10 years.  Varicella vaccine. An adult without evidence of immunity to varicella should receive 2 doses or a second dose if he has previously received 1 dose.  Human papillomavirus (HPV) vaccine. Males aged 32-21 years who have not received the vaccine previously should receive the 3-dose series. Males aged 22-26 years may be immunized. Immunization is recommended through the age of 35 years for any male who has sex with males and did not get any or all doses earlier. Immunization is recommended for any person with an immunocompromised condition  through the age of 47 years if he did not get any or all doses earlier. During the 3-dose series, the second dose should be obtained 4-8 weeks after the first dose. The third dose should be obtained 24 weeks after the first dose and 16 weeks after the second dose.  Zoster vaccine. One dose is recommended for adults aged 85 years or older unless certain conditions are present.  Measles, mumps, and rubella (MMR) vaccine. Adults born before [birth year] generally are considered immune to measles and mumps. Adults born in [birth year] or later should have 1 or more doses of MMR vaccine unless there is a contraindication to the vaccine or there is laboratory evidence  of immunity to each of the three diseases. A routine second dose of MMR vaccine should be obtained at least 28 days after the first dose for students attending postsecondary schools, health care workers, or international travelers. People who received inactivated measles vaccine or an unknown type of measles vaccine during 1963-1967 should receive 2 doses of MMR vaccine. People who received inactivated mumps vaccine or an unknown type of mumps vaccine before 1979 and are at high risk for mumps infection should consider immunization with 2 doses of MMR vaccine. Unvaccinated health care workers born before [birth year] who lack laboratory evidence of measles, mumps, or rubella immunity or laboratory confirmation of disease should consider measles and mumps immunization with 2 doses of MMR vaccine or rubella immunization with 1 dose of MMR vaccine.  Pneumococcal 13-valent conjugate (PCV13) vaccine. When indicated, a person who is uncertain of his immunization history and has no record of immunization should receive the PCV13 vaccine. An adult aged 21 years or older who has certain medical conditions and has not been previously immunized should receive 1 dose of PCV13 vaccine. This PCV13 should be followed with a dose of pneumococcal polysaccharide (PPSV23) vaccine. The PPSV23  vaccine dose should be obtained at least 8 weeks after the dose of PCV13 vaccine. An adult aged 54 years or older who has certain medical conditions and previously received 1 or more doses of PPSV23 vaccine should receive 1 dose of PCV13. The PCV13 vaccine dose should be obtained 1 or more years after the last PPSV23 vaccine dose.  Pneumococcal polysaccharide (PPSV23) vaccine. When PCV13 is also indicated, PCV13 should be obtained first. All adults aged 41 years and older should be immunized. An adult younger than age 32 years who has certain medical conditions should be immunized. Any person who resides in a nursing home or long-term care facility should be immunized. An adult smoker should be immunized. People with an immunocompromised condition and certain other conditions should receive both PCV13 and PPSV23 vaccines. People with human immunodeficiency virus (HIV) infection should be immunized as soon as possible after diagnosis. Immunization during chemotherapy or radiation therapy should be avoided. Routine use of PPSV23 vaccine is not recommended for American Indians, [place]atives, or people younger than 65 years unless there are medical conditions that require PPSV23 vaccine. When indicated, people who have unknown immunization and have no record of immunization should receive PPSV23 vaccine. One-time revaccination 5 years after the first dose of PPSV23 is recommended for people aged 19-64 years who have chronic kidney failure, nephrotic syndrome, asplenia, or immunocompromised conditions. People who received 1-2 doses of PPSV23 before age 83 years should receive another dose of PPSV23 vaccine at age 75 years or later if at least 5 years have passed since the previous dose. Doses of PPSV23 are not needed for people immunized with PPSV23 at or after age 77 years.  Meningococcal vaccine. Adults with asplenia or persistent complement component deficiencies should receive 2 doses of quadrivalent  meningococcal conjugate (MenACWY-D) vaccine. The doses should be obtained at least 2 months apart. Microbiologists working with certain meningococcal bacteria, [place] recruits, people at risk during an outbreak, and people who travel to or live in countries with a high rate of meningitis should be immunized. A first-year college student up through age 83 years who is living in a residence hall should receive a dose if he did not receive a dose on or after his 16th birthday. Adults who have certain high-risk conditions should receive one or more doses of  vaccine.  Hepatitis A vaccine. Adults who wish to be protected from this disease, have certain high-risk conditions, work with hepatitis A-infected animals, work in hepatitis A research labs, or travel to or work in countries with a high rate of hepatitis A should be immunized. Adults who were previously unvaccinated and who anticipate close contact with an international adoptee during the first 60 days after arrival in the [place] from a country with a high rate of hepatitis A should be immunized.  Hepatitis B vaccine. Adults should be immunized if they wish to be protected from this disease, have certain high-risk conditions, may be exposed to blood or other infectious body fluids, are household contacts or sex partners of hepatitis B positive people, are clients or workers in certain care facilities, or travel to or work in countries with a high rate of hepatitis B.  Haemophilus influenzae type b (Hib) vaccine. A previously unvaccinated person with asplenia or sickle cell disease or having a scheduled splenectomy should receive 1 dose of Hib vaccine. Regardless of previous immunization, a recipient of a hematopoietic stem cell transplant should receive a 3-dose series 6-12 months after his successful transplant. Hib vaccine is not recommended for adults with HIV infection. Preventive Service / Frequency Ages 52 to 40  Blood pressure check.** /  Every 1 to 2 years.  Lipid and cholesterol check.** / Every 5 years beginning at age 19.  Hepatitis C blood test.** / For any individual with known risks for hepatitis C.  Skin self-exam. / Monthly.  Influenza vaccine. / Every year.  Tetanus, diphtheria, and acellular pertussis (Tdap, Td) vaccine.** / Consult your health care provider. 1 dose of Td every 10 years.  Varicella vaccine.** / Consult your health care provider.  HPV vaccine. / 3 doses over 6 months, if 26 or younger.  Measles, mumps, rubella (MMR) vaccine.** / You need at least 1 dose of MMR if you were born in 1958 or later. You may also need a second dose.  Pneumococcal 13-valent conjugate (PCV13) vaccine.** / Consult your health care provider.  Pneumococcal polysaccharide (PPSV23) vaccine.** / 1 to 2 doses if you smoke cigarettes or if you have certain conditions.  Meningococcal vaccine.** / 1 dose if you are age 39 to 94 years and a Market researcher living in a residence hall, or have one of several medical conditions. You may also need additional booster doses.  Hepatitis A vaccine.** / Consult your health care provider.  Hepatitis B vaccine.** / Consult your health care provider.  Haemophilus influenzae type b (Hib) vaccine.** / Consult your health care provider. Ages 20 to 38  Blood pressure check.** / Every 1 to 2 years.  Lipid and cholesterol check.** / Every 5 years beginning at age 15.  Lung cancer screening. / Every year if you are aged 78-80 years and have a 30-pack-year history of smoking and currently smoke or have quit within the past 15 years. Yearly screening is stopped once you have quit smoking for at least 15 years or develop a health problem that would prevent you from having lung cancer treatment.  Fecal occult blood test (FOBT) of stool. / Every year beginning at age 73 and continuing until age 80. You may not have to do this test if you get a colonoscopy every 10 years.  Flexible  sigmoidoscopy** or colonoscopy.** / Every 5 years for a flexible sigmoidoscopy or every 10 years for a colonoscopy beginning at age 84 and continuing until age 52.  Hepatitis C  blood test.** / For all people born from [birth year] through 1965 and any individual with known risks for hepatitis C.  Skin self-exam. / Monthly.  Influenza vaccine. / Every year.  Tetanus, diphtheria, and acellular pertussis (Tdap/Td) vaccine.** / Consult your health care provider. 1 dose of Td every 10 years.  Varicella vaccine.** / Consult your health care provider.  Zoster vaccine.** / 1 dose for adults aged 44 years or older.  Measles, mumps, rubella (MMR) vaccine.** / You need at least 1 dose of MMR if you were born in 1958 or later. You may also need a second dose.  Pneumococcal 13-valent conjugate (PCV13) vaccine.** / Consult your health care provider.  Pneumococcal polysaccharide (PPSV23) vaccine.** / 1 to 2 doses if you smoke cigarettes or if you have certain conditions.  Meningococcal vaccine.** / Consult your health care provider.  Hepatitis A vaccine.** / Consult your health care provider.  Hepatitis B vaccine.** / Consult your health care provider.  Haemophilus influenzae type b (Hib) vaccine.** / Consult your health care provider. Ages 41 and over  Blood pressure check.** / Every 1 to 2 years.  Lipid and cholesterol check.**/ Every 5 years beginning at age 35.  Lung cancer screening. / Every year if you are aged 43-80 years and have a 30-pack-year history of smoking and currently smoke or have quit within the past 15 years. Yearly screening is stopped once you have quit smoking for at least 15 years or develop a health problem that would prevent you from having lung cancer treatment.  Fecal occult blood test (FOBT) of stool. / Every year beginning at age 73 and continuing until age 68. You may not have to do this test if you get a colonoscopy every 10 years.  Flexible sigmoidoscopy** or  colonoscopy.** / Every 5 years for a flexible sigmoidoscopy or every 10 years for a colonoscopy beginning at age 26 and continuing until age 33.  Hepatitis C blood test.** / For all people born from [birth year] through 1966 and any individual with known risks for hepatitis C.  Abdominal aortic aneurysm (AAA) screening.** / A one-time screening for ages 7 to 104 years who are current or former smokers.  Skin self-exam. / Monthly.  Influenza vaccine. / Every year.  Tetanus, diphtheria, and acellular pertussis (Tdap/Td) vaccine.** / 1 dose of Td every 10 years.  Varicella vaccine.** / Consult your health care provider.  Zoster vaccine.** / 1 dose for adults aged 38 years or older.  Pneumococcal 13-valent conjugate (PCV13) vaccine.** / Consult your health care provider.  Pneumococcal polysaccharide (PPSV23) vaccine.** / 1 dose for all adults aged 22 years and older.  Meningococcal vaccine.** / Consult your health care provider.  Hepatitis A vaccine.** / Consult your health care provider.  Hepatitis B vaccine.** / Consult your health care provider.  Haemophilus influenzae type b (Hib) vaccine.** / Consult your health care provider. **Family history and personal history of risk and conditions may change your health care provider's recommendations. Document Released: 04/27/2001 Document Revised: 03/06/2013 Document Reviewed: 07/27/2010 [facility] Patient Information 2015 [place], [place]. This information is not intended to replace advice given to you by your health care provider. Make sure you discuss any questions you have with your health care provider.

## 2014-07-02 NOTE — Assessment & Plan Note (Signed)
Well controlled, no changes to meds. Encouraged heart healthy diet such as the DASH diet and exercise as tolerated.  °

## 2014-07-02 NOTE — Assessment & Plan Note (Signed)
[id number] , minimize simple carbs. Increase exercise as tolerated. Continue current meds 

## 2014-07-03 ENCOUNTER — Encounter (HOSPITAL_COMMUNITY)
Admission: RE | Admit: 2014-07-03 | Discharge: 2014-07-03 | Disposition: A | Discharge status: Home / Self Care | Payer: Medicare Other | Source: Ambulatory Visit | Attending: Cardiology | Admitting: Cardiology

## 2014-07-03 DIAGNOSIS — E119 Type 2 diabetes mellitus without complications: Secondary | ICD-10-CM | POA: Diagnosis not present

## 2014-07-03 DIAGNOSIS — Z955 Presence of coronary angioplasty implant and graft: Secondary | ICD-10-CM | POA: Diagnosis not present

## 2014-07-03 DIAGNOSIS — I25110 Atherosclerotic heart disease of native coronary artery with unstable angina pectoris: Secondary | ICD-10-CM | POA: Diagnosis not present

## 2014-07-03 DIAGNOSIS — E785 Hyperlipidemia, unspecified: Secondary | ICD-10-CM | POA: Diagnosis not present

## 2014-07-03 DIAGNOSIS — I10 Essential (primary) hypertension: Secondary | ICD-10-CM | POA: Diagnosis not present

## 2014-07-03 DIAGNOSIS — Z5189 Encounter for other specified aftercare: Secondary | ICD-10-CM | POA: Diagnosis not present

## 2014-07-03 LAB — GLUCOSE, CAPILLARY: GLUCOSE-CAPILLARY: 93 mg/dL (ref 70–99)

## 2014-07-05 ENCOUNTER — Encounter (HOSPITAL_COMMUNITY)
Admission: RE | Admit: 2014-07-05 | Discharge: 2014-07-05 | Disposition: A | Discharge status: Home / Self Care | Payer: Medicare Other | Source: Ambulatory Visit | Attending: Cardiology | Admitting: Cardiology

## 2014-07-05 DIAGNOSIS — I25110 Atherosclerotic heart disease of native coronary artery with unstable angina pectoris: Secondary | ICD-10-CM | POA: Diagnosis not present

## 2014-07-05 DIAGNOSIS — E119 Type 2 diabetes mellitus without complications: Secondary | ICD-10-CM | POA: Diagnosis not present

## 2014-07-05 DIAGNOSIS — E785 Hyperlipidemia, unspecified: Secondary | ICD-10-CM | POA: Diagnosis not present

## 2014-07-05 DIAGNOSIS — Z5189 Encounter for other specified aftercare: Secondary | ICD-10-CM | POA: Diagnosis not present

## 2014-07-05 DIAGNOSIS — I10 Essential (primary) hypertension: Secondary | ICD-10-CM | POA: Diagnosis not present

## 2014-07-05 DIAGNOSIS — Z955 Presence of coronary angioplasty implant and graft: Secondary | ICD-10-CM | POA: Diagnosis not present

## 2014-07-07 ENCOUNTER — Encounter: Payer: Self-pay | Admitting: Family Medicine

## 2014-07-07 DIAGNOSIS — Z0000 Encounter for general adult medical examination without abnormal findings: Secondary | ICD-10-CM | POA: Insufficient documentation

## 2014-07-07 DIAGNOSIS — D696 Thrombocytopenia, unspecified: Secondary | ICD-10-CM

## 2014-07-07 HISTORY — DX: Thrombocytopenia, unspecified: D69.6

## 2014-07-07 NOTE — Assessment & Plan Note (Signed)
New, very mild, asymptomatic, will monitor

## 2014-07-07 NOTE — Assessment & Plan Note (Signed)
Tolerating statin, encouraged heart healthy diet, avoid trans fats, minimize simple carbs and saturated fats. Increase exercise as tolerated 

## 2014-07-07 NOTE — Assessment & Plan Note (Signed)
Continue supplements weekly and reassess with next blood draw

## 2014-07-07 NOTE — Assessment & Plan Note (Signed)
Encouraged DASH diet, decrease po intake and increase exercise as tolerated. Needs 7-8 hours of sleep nightly. Avoid trans fats, eat small, frequent meals every 4-5 hours with lean proteins, complex carbs and healthy fats. Minimize simple carbs 

## 2014-07-07 NOTE — Assessment & Plan Note (Addendum)
Patient denies any difficulties at home. No trouble with ADLs, depression or falls. No recent changes to vision or hearing. Is UTD with immunizations. Is UTD with screening. Discussed Advanced Directives, patient agrees to bring [place] copies of documents if can. Encouraged heart healthy diet, exercise as tolerated and adequate sleep. Labs reviewed. Sees cardiology Dr Percival Spanish, Dr Angelena Form Sees opthamology Dr Maricela Curet, follows with [facility] Last colonoscopy 2013 per patient. See problem list for patient risk factors See AVS for schedule for preventative health measures.

## 2014-07-08 ENCOUNTER — Encounter (HOSPITAL_COMMUNITY)
Admission: RE | Admit: 2014-07-08 | Discharge: 2014-07-08 | Disposition: A | Discharge status: Home / Self Care | Payer: Medicare Other | Source: Ambulatory Visit | Attending: Cardiology | Admitting: Cardiology

## 2014-07-08 DIAGNOSIS — Z955 Presence of coronary angioplasty implant and graft: Secondary | ICD-10-CM | POA: Diagnosis not present

## 2014-07-08 DIAGNOSIS — E785 Hyperlipidemia, unspecified: Secondary | ICD-10-CM | POA: Diagnosis not present

## 2014-07-08 DIAGNOSIS — Z5189 Encounter for other specified aftercare: Secondary | ICD-10-CM | POA: Diagnosis not present

## 2014-07-08 DIAGNOSIS — I25110 Atherosclerotic heart disease of native coronary artery with unstable angina pectoris: Secondary | ICD-10-CM | POA: Diagnosis not present

## 2014-07-08 DIAGNOSIS — I10 Essential (primary) hypertension: Secondary | ICD-10-CM | POA: Diagnosis not present

## 2014-07-08 DIAGNOSIS — E119 Type 2 diabetes mellitus without complications: Secondary | ICD-10-CM | POA: Diagnosis not present

## 2014-07-08 LAB — GLUCOSE, CAPILLARY: Glucose-Capillary: 220 mg/dL — ABNORMAL HIGH (ref 70–99)

## 2014-07-08 NOTE — Progress Notes (Signed)
Michael Cunningham 69 y.o. male Nutrition Note Spoke with pt. Nutrition Plan and Nutrition Survey goals reviewed with pt. Pt is following Step 1 of the Therapeutic Lifestyle Changes diet. Pt wants to lose wt. Pt has been trying to lose wt by increasing vegetable intake and reducing high-fat foods from diet. Wt loss tips reviewed.  Pt is diabetic. Last A1c indicates blood glucose well-controlled. This Probation officer went over Diabetes Education test results. Pt expressed understanding of the information reviewed. Pt aware of nutrition education classes offered.  Lab Results  Component Value Date   HGBA1C 7.9* 03/21/2014   Nutrition Diagnosis ? Food-and nutrition-related knowledge deficit related to lack of exposure to information as related to diagnosis of: ? CVD ? DM  ? Obesity related to excessive energy intake as evidenced by a BMI of 36.7  Nutrition RX/ Estimated Daily Nutrition Needs for: wt loss   1650-2150 Kcal, 45-55 gm fat, 10-14 gm sat fat, 1.6-2.1 gm trans-fat, <1500 mg sodium, 175-250 gm CHO   Nutrition Intervention ? Pt's individual nutrition plan reviewed with pt. ? Benefits of adopting Therapeutic Lifestyle Changes discussed when Medficts reviewed. ? Pt to attend the Portion Distortion class ? Pt to attend the Diabetes Q & A class ? Pt to attend the   ? Nutrition I class                     ? Nutrition II class     ? Diabetes Blitz class (attended 4/19) ? Continue client-centered nutrition education by RD, as part of interdisciplinary care.  Goal(s) ? Pt to describe the potential benefits of adopting Therapeutic Lifestyle Changes ? Pt to identify food quantities necessary to achieve: ? wt loss to a goal wt of 224-242 lb (101.7-109.9 kg) at graduation from cardiac rehab.  ? Pt to describe the benefit of including fruits, vegetables, whole grains, and low-fat dairy products in a heart healthy meal plan. ? CBG concentrations in the normal range or as close to normal as is safely  possible. ? Pt able to name foods that affect blood glucose   Monitor and Evaluate progress toward nutrition goal with team.  Michael Dove, MS Dietetic Intern    Michael Cunningham, M.Ed, RD, LDN, CDE 07/08/2014 3:09 PM

## 2014-07-10 ENCOUNTER — Encounter (HOSPITAL_COMMUNITY)
Admission: RE | Admit: 2014-07-10 | Discharge: 2014-07-10 | Disposition: A | Discharge status: Home / Self Care | Payer: Medicare Other | Source: Ambulatory Visit | Attending: Cardiology | Admitting: Cardiology

## 2014-07-10 DIAGNOSIS — E785 Hyperlipidemia, unspecified: Secondary | ICD-10-CM | POA: Diagnosis not present

## 2014-07-10 DIAGNOSIS — E119 Type 2 diabetes mellitus without complications: Secondary | ICD-10-CM | POA: Diagnosis not present

## 2014-07-10 DIAGNOSIS — Z955 Presence of coronary angioplasty implant and graft: Secondary | ICD-10-CM | POA: Diagnosis not present

## 2014-07-10 DIAGNOSIS — Z5189 Encounter for other specified aftercare: Secondary | ICD-10-CM | POA: Diagnosis not present

## 2014-07-10 DIAGNOSIS — I10 Essential (primary) hypertension: Secondary | ICD-10-CM | POA: Diagnosis not present

## 2014-07-10 DIAGNOSIS — I25110 Atherosclerotic heart disease of native coronary artery with unstable angina pectoris: Secondary | ICD-10-CM | POA: Diagnosis not present

## 2014-07-12 ENCOUNTER — Encounter (HOSPITAL_COMMUNITY): Payer: Medicare Other

## 2014-07-15 ENCOUNTER — Encounter (HOSPITAL_COMMUNITY)
Admission: RE | Admit: 2014-07-15 | Discharge: 2014-07-15 | Disposition: A | Discharge status: Home / Self Care | Payer: Medicare Other | Source: Ambulatory Visit | Attending: Cardiology | Admitting: Cardiology

## 2014-07-15 DIAGNOSIS — Z955 Presence of coronary angioplasty implant and graft: Secondary | ICD-10-CM | POA: Insufficient documentation

## 2014-07-15 DIAGNOSIS — Z794 Long term (current) use of insulin: Secondary | ICD-10-CM | POA: Diagnosis not present

## 2014-07-15 DIAGNOSIS — Z6834 Body mass index (BMI) 34.0-34.9, adult: Secondary | ICD-10-CM | POA: Insufficient documentation

## 2014-07-15 DIAGNOSIS — I10 Essential (primary) hypertension: Secondary | ICD-10-CM | POA: Insufficient documentation

## 2014-07-15 DIAGNOSIS — Z79899 Other long term (current) drug therapy: Secondary | ICD-10-CM | POA: Insufficient documentation

## 2014-07-15 DIAGNOSIS — E785 Hyperlipidemia, unspecified: Secondary | ICD-10-CM | POA: Diagnosis not present

## 2014-07-15 DIAGNOSIS — I25110 Atherosclerotic heart disease of native coronary artery with unstable angina pectoris: Secondary | ICD-10-CM | POA: Diagnosis not present

## 2014-07-15 DIAGNOSIS — Z87891 Personal history of nicotine dependence: Secondary | ICD-10-CM | POA: Diagnosis not present

## 2014-07-15 DIAGNOSIS — E669 Obesity, unspecified: Secondary | ICD-10-CM | POA: Diagnosis not present

## 2014-07-15 DIAGNOSIS — Z5189 Encounter for other specified aftercare: Secondary | ICD-10-CM | POA: Insufficient documentation

## 2014-07-15 DIAGNOSIS — E119 Type 2 diabetes mellitus without complications: Secondary | ICD-10-CM | POA: Diagnosis not present

## 2014-07-15 DIAGNOSIS — Z87442 Personal history of urinary calculi: Secondary | ICD-10-CM | POA: Insufficient documentation

## 2014-07-15 DIAGNOSIS — Z7982 Long term (current) use of aspirin: Secondary | ICD-10-CM | POA: Diagnosis not present

## 2014-07-16 LAB — GLUCOSE, CAPILLARY: Glucose-Capillary: 170 mg/dL — ABNORMAL HIGH (ref 70–99)

## 2014-07-17 ENCOUNTER — Encounter (HOSPITAL_COMMUNITY)
Admission: RE | Admit: 2014-07-17 | Discharge: 2014-07-17 | Disposition: A | Discharge status: Home / Self Care | Payer: Medicare Other | Source: Ambulatory Visit | Attending: Cardiology | Admitting: Cardiology

## 2014-07-17 DIAGNOSIS — Z955 Presence of coronary angioplasty implant and graft: Secondary | ICD-10-CM | POA: Diagnosis not present

## 2014-07-17 DIAGNOSIS — I10 Essential (primary) hypertension: Secondary | ICD-10-CM | POA: Diagnosis not present

## 2014-07-17 DIAGNOSIS — E785 Hyperlipidemia, unspecified: Secondary | ICD-10-CM | POA: Diagnosis not present

## 2014-07-17 DIAGNOSIS — I25110 Atherosclerotic heart disease of native coronary artery with unstable angina pectoris: Secondary | ICD-10-CM | POA: Diagnosis not present

## 2014-07-17 DIAGNOSIS — Z5189 Encounter for other specified aftercare: Secondary | ICD-10-CM | POA: Diagnosis not present

## 2014-07-17 DIAGNOSIS — E119 Type 2 diabetes mellitus without complications: Secondary | ICD-10-CM | POA: Diagnosis not present

## 2014-07-19 ENCOUNTER — Encounter (HOSPITAL_COMMUNITY)
Admission: RE | Admit: 2014-07-19 | Discharge: 2014-07-19 | Disposition: A | Discharge status: Home / Self Care | Payer: Medicare Other | Source: Ambulatory Visit | Attending: Cardiology | Admitting: Cardiology

## 2014-07-19 DIAGNOSIS — E119 Type 2 diabetes mellitus without complications: Secondary | ICD-10-CM | POA: Diagnosis not present

## 2014-07-19 DIAGNOSIS — Z955 Presence of coronary angioplasty implant and graft: Secondary | ICD-10-CM | POA: Diagnosis not present

## 2014-07-19 DIAGNOSIS — Z5189 Encounter for other specified aftercare: Secondary | ICD-10-CM | POA: Diagnosis not present

## 2014-07-19 DIAGNOSIS — I10 Essential (primary) hypertension: Secondary | ICD-10-CM | POA: Diagnosis not present

## 2014-07-19 DIAGNOSIS — E785 Hyperlipidemia, unspecified: Secondary | ICD-10-CM | POA: Diagnosis not present

## 2014-07-19 DIAGNOSIS — I25110 Atherosclerotic heart disease of native coronary artery with unstable angina pectoris: Secondary | ICD-10-CM | POA: Diagnosis not present

## 2014-07-22 ENCOUNTER — Encounter (HOSPITAL_COMMUNITY)
Admission: RE | Admit: 2014-07-22 | Discharge: 2014-07-22 | Disposition: A | Discharge status: Home / Self Care | Payer: Medicare Other | Source: Ambulatory Visit | Attending: Cardiology | Admitting: Cardiology

## 2014-07-22 DIAGNOSIS — I10 Essential (primary) hypertension: Secondary | ICD-10-CM | POA: Diagnosis not present

## 2014-07-22 DIAGNOSIS — Z955 Presence of coronary angioplasty implant and graft: Secondary | ICD-10-CM | POA: Diagnosis not present

## 2014-07-22 DIAGNOSIS — Z5189 Encounter for other specified aftercare: Secondary | ICD-10-CM | POA: Diagnosis not present

## 2014-07-22 DIAGNOSIS — E785 Hyperlipidemia, unspecified: Secondary | ICD-10-CM | POA: Diagnosis not present

## 2014-07-22 DIAGNOSIS — E119 Type 2 diabetes mellitus without complications: Secondary | ICD-10-CM | POA: Diagnosis not present

## 2014-07-22 DIAGNOSIS — I25110 Atherosclerotic heart disease of native coronary artery with unstable angina pectoris: Secondary | ICD-10-CM | POA: Diagnosis not present

## 2014-07-23 ENCOUNTER — Telehealth: Payer: Self-pay | Admitting: Family Medicine

## 2014-07-23 NOTE — Telephone Encounter (Signed)
Spoke to the patient informed last a1c was in January 2016 from Dr. Loanne Drilling.. Canceled lab appt. For Friday and schedule lab appt. For tomorrow Jul 24, 2014 at 9:15am.

## 2014-07-23 NOTE — Telephone Encounter (Signed)
Caller name: [facility] Relation to pt: self Call back number: [phone] Pharmacy:  Reason for call:   Patient has lab appointment on Friday but wants to reschedule to this Thursday and wants to make sure that it has been 90 days since last a1c was checked.(so insurance will cover). Not sure if labs have been ordered.

## 2014-07-24 ENCOUNTER — Encounter (HOSPITAL_COMMUNITY)
Admission: RE | Admit: 2014-07-24 | Discharge: 2014-07-24 | Disposition: A | Discharge status: Home / Self Care | Payer: Medicare Other | Source: Ambulatory Visit | Attending: Cardiology | Admitting: Cardiology

## 2014-07-24 ENCOUNTER — Other Ambulatory Visit (INDEPENDENT_AMBULATORY_CARE_PROVIDER_SITE_OTHER): Payer: Medicare Other

## 2014-07-24 DIAGNOSIS — E785 Hyperlipidemia, unspecified: Secondary | ICD-10-CM | POA: Diagnosis not present

## 2014-07-24 DIAGNOSIS — E669 Obesity, unspecified: Secondary | ICD-10-CM

## 2014-07-24 DIAGNOSIS — E119 Type 2 diabetes mellitus without complications: Secondary | ICD-10-CM | POA: Diagnosis not present

## 2014-07-24 DIAGNOSIS — I10 Essential (primary) hypertension: Secondary | ICD-10-CM | POA: Diagnosis not present

## 2014-07-24 DIAGNOSIS — Z5189 Encounter for other specified aftercare: Secondary | ICD-10-CM | POA: Diagnosis not present

## 2014-07-24 DIAGNOSIS — E1169 Type 2 diabetes mellitus with other specified complication: Secondary | ICD-10-CM

## 2014-07-24 DIAGNOSIS — E559 Vitamin D deficiency, unspecified: Secondary | ICD-10-CM

## 2014-07-24 DIAGNOSIS — R3911 Hesitancy of micturition: Secondary | ICD-10-CM

## 2014-07-24 DIAGNOSIS — Z955 Presence of coronary angioplasty implant and graft: Secondary | ICD-10-CM | POA: Diagnosis not present

## 2014-07-24 DIAGNOSIS — I25110 Atherosclerotic heart disease of native coronary artery with unstable angina pectoris: Secondary | ICD-10-CM | POA: Diagnosis not present

## 2014-07-24 LAB — CBC
HEMATOCRIT: 42.4 % (ref 39.0–52.0)
Hemoglobin: 14.6 g/dL (ref 13.0–17.0)
MCHC: 34.4 g/dL (ref 30.0–36.0)
MCV: 90.5 fl (ref 78.0–100.0)
Platelets: 180 10*3/uL (ref 150.0–400.0)
RBC: 4.68 Mil/uL (ref 4.22–5.81)
RDW: 13.9 % (ref 11.5–15.5)
WBC: 5.6 10*3/uL (ref 4.0–10.5)

## 2014-07-24 LAB — COMPREHENSIVE METABOLIC PANEL
ALBUMIN: 3.8 g/dL (ref 3.5–5.2)
ALK PHOS: 86 U/L (ref 39–117)
ALT: 27 U/L (ref 0–53)
AST: 23 U/L (ref 0–37)
BILIRUBIN TOTAL: 0.7 mg/dL (ref 0.2–1.2)
BUN: 18 mg/dL (ref 6–23)
CO2: 25 meq/L (ref 19–32)
CREATININE: 0.87 mg/dL (ref 0.40–1.50)
Calcium: 9 mg/dL (ref 8.4–10.5)
Chloride: 103 mEq/L (ref 96–112)
GFR: 92.19 mL/min (ref >60.00)
Glucose, Bld: 290 mg/dL — ABNORMAL HIGH (ref 70–99)
Potassium: 4.9 mEq/L (ref 3.5–5.1)
SODIUM: 135 meq/L (ref 135–145)
Total Protein: 6.6 g/dL (ref 6.0–8.3)

## 2014-07-24 LAB — PSA: PSA: 1.24 ng/mL (ref 0.10–4.00)

## 2014-07-24 LAB — VITAMIN D 25 HYDROXY (VIT D DEFICIENCY, FRACTURES): VITD: 18.39 ng/mL — ABNORMAL LOW (ref 30.00–100.00)

## 2014-07-24 LAB — TSH: TSH: 0.92 u[IU]/mL (ref 0.35–4.50)

## 2014-07-24 LAB — HEMOGLOBIN A1C: Hgb A1c MFr Bld: 6.9 % — ABNORMAL HIGH (ref 4.6–6.5)

## 2014-07-26 ENCOUNTER — Other Ambulatory Visit: Payer: Medicare Other

## 2014-07-26 ENCOUNTER — Encounter (HOSPITAL_COMMUNITY): Payer: Medicare Other

## 2014-07-29 ENCOUNTER — Encounter: Payer: Self-pay | Admitting: Internal Medicine

## 2014-07-29 ENCOUNTER — Encounter (HOSPITAL_COMMUNITY)
Admission: RE | Admit: 2014-07-29 | Discharge: 2014-07-29 | Disposition: A | Discharge status: Home / Self Care | Payer: Medicare Other | Source: Ambulatory Visit | Attending: Cardiology | Admitting: Cardiology

## 2014-07-29 ENCOUNTER — Ambulatory Visit (INDEPENDENT_AMBULATORY_CARE_PROVIDER_SITE_OTHER): Payer: Medicare Other | Admitting: Internal Medicine

## 2014-07-29 VITALS — BP 122/70 | HR 64 | Temp 97.5°F | Resp 12 | Wt 251.8 lb

## 2014-07-29 DIAGNOSIS — I25110 Atherosclerotic heart disease of native coronary artery with unstable angina pectoris: Secondary | ICD-10-CM | POA: Diagnosis not present

## 2014-07-29 DIAGNOSIS — E1142 Type 2 diabetes mellitus with diabetic polyneuropathy: Secondary | ICD-10-CM

## 2014-07-29 DIAGNOSIS — I2510 Atherosclerotic heart disease of native coronary artery without angina pectoris: Secondary | ICD-10-CM

## 2014-07-29 DIAGNOSIS — E1140 Type 2 diabetes mellitus with diabetic neuropathy, unspecified: Secondary | ICD-10-CM

## 2014-07-29 DIAGNOSIS — E1165 Type 2 diabetes mellitus with hyperglycemia: Principal | ICD-10-CM

## 2014-07-29 DIAGNOSIS — I10 Essential (primary) hypertension: Secondary | ICD-10-CM | POA: Diagnosis not present

## 2014-07-29 DIAGNOSIS — Z955 Presence of coronary angioplasty implant and graft: Secondary | ICD-10-CM | POA: Diagnosis not present

## 2014-07-29 DIAGNOSIS — E119 Type 2 diabetes mellitus without complications: Secondary | ICD-10-CM | POA: Diagnosis not present

## 2014-07-29 DIAGNOSIS — E785 Hyperlipidemia, unspecified: Secondary | ICD-10-CM | POA: Diagnosis not present

## 2014-07-29 DIAGNOSIS — Z5189 Encounter for other specified aftercare: Secondary | ICD-10-CM | POA: Diagnosis not present

## 2014-07-29 LAB — GLUCOSE, CAPILLARY: GLUCOSE-CAPILLARY: 167 mg/dL — AB (ref 65–99)

## 2014-07-29 MED ORDER — INSULIN NPH (HUMAN) (ISOPHANE) 100 UNIT/ML ~~LOC~~ SUSP: SUBCUTANEOUS | Status: DC

## 2014-07-29 MED ORDER — INSULIN REGULAR HUMAN 100 UNIT/ML IJ SOLN: INTRAMUSCULAR | Status: DC

## 2014-07-29 NOTE — Patient Instructions (Addendum)
Please change the insulin regimen as follows:  Insulin Before breakfast Before lunch Before dinner Bedtime  Regular 20-25-30 + SSI 20-25-30 + SSI 20-25-30 + SSI   NPH 20   20   Please use: - 20 units of R insulin before a smaller meal - 25 units of R insulin before a regular meal - 30 units of R insulin before a larger meal  Please add the following Sliding scale of R insulin (SSI): - 150-175: + 1 unit  - 176-200: + 2 units  - 201-225: + 3 units  - 226-250: + 4 units  - 251-275: + 5 units - 276-300: + 6 units  Please do not use more than 20 units of R insulin before a meal prior to exercise (rehab, working in the yard, etc.)  Please inject the insulin 30 min before meals.  Please return after July 5th with your sugar log.   PATIENT INSTRUCTIONS FOR TYPE 2 DIABETES:  **Please join MyChart!** - see attached instructions about how to join if you have not done so already.  DIET AND EXERCISE Diet and exercise is an important part of diabetic treatment.  We recommended aerobic exercise in the form of brisk walking (working between 40-60% of maximal aerobic capacity, similar to brisk walking) for 150 minutes per week (such as 30 minutes five days per week) along with 3 times per week performing 'resistance' training (using various gauge rubber tubes with handles) 5-10 exercises involving the major muscle groups (upper body, lower body and core) performing 10-15 repetitions (or near fatigue) each exercise. Start at half the above goal but build slowly to reach the above goals. If limited by weight, joint pain, or disability, we recommend daily walking in a swimming pool with water up to waist to reduce pressure from joints while allow for adequate exercise.    BLOOD GLUCOSES Monitoring your blood glucoses is important for continued management of your diabetes. Please check your blood glucoses 2-4 times a day: fasting, before meals and at bedtime (you can rotate these measurements - e.g. one  day check before the 3 meals, the next day check before 2 of the meals and before bedtime, etc.).   HYPOGLYCEMIA (low blood sugar) Hypoglycemia is usually a reaction to not eating, exercising, or taking too much insulin/ other diabetes drugs.  Symptoms include tremors, sweating, hunger, confusion, headache, etc. Treat IMMEDIATELY with 15 grams of Carbs: . 4 glucose tablets .  cup regular juice/soda . 2 tablespoons raisins . 4 teaspoons sugar . 1 tablespoon honey Recheck blood glucose in 15 mins and repeat above if still symptomatic/blood glucose <100.  RECOMMENDATIONS TO REDUCE YOUR RISK OF DIABETIC COMPLICATIONS: * Take your prescribed MEDICATION(S) * Follow a DIABETIC diet: Complex carbs, fiber rich foods, (monounsaturated and polyunsaturated) fats * AVOID saturated/trans fats, high fat foods, >2,300 mg salt per day. * EXERCISE at least 5 times a week for 30 minutes or preferably daily.  * DO NOT SMOKE OR DRINK more than 1 drink a day. * Check your FEET every day. Do not wear tightfitting shoes. Contact us if you develop an ulcer * See your EYE doctor once a year or more if needed * Get a FLU shot once a year * Get a PNEUMONIA vaccine once before and once after age 60 years  GOALS:  * Your Hemoglobin A1c of <7%  * fasting sugars need to be <130 * after meals sugars need to be <180 (2h after you start eating) * Your Systolic BP  should be 140 or lower  * Your Diastolic BP should be 80 or lower  * Your HDL (Good Cholesterol) should be 40 or higher  * Your LDL (Bad Cholesterol) should be 100 or lower. * Your Triglycerides should be 150 or lower  * Your Urine microalbumin (kidney function) should be <30 * Your Body Mass Index should be 25 or lower    Please consider the following ways to cut down carbs and fat and increase fiber and micronutrients in your diet: - substitute whole grain for white bread or pasta - substitute brown rice for white rice - substitute 90-calorie flat  bread pieces for slices of bread when possible - substitute sweet potatoes or yams for white potatoes - substitute humus for margarine - substitute tofu for cheese when possible - substitute almond or rice milk for regular milk (would not drink soy milk daily due to concern for soy estrogen influence on breast cancer risk) - substitute dark chocolate for other sweets when possible - substitute water - can add lemon or orange slices for taste - for diet sodas (artificial sweeteners will trick your body that you can eat sweets without getting calories and will lead you to overeating and weight gain in the long run) - do not skip breakfast or other meals (this will slow down the metabolism and will result in more weight gain over time)  - can try smoothies made from fruit and almond/rice milk in am instead of regular breakfast - can also try old-fashioned (not instant) oatmeal made with almond/rice milk in am - order the dressing on the side when eating salad at a restaurant (pour less than half of the dressing on the salad) - eat as little meat as possible - can try juicing, but should not forget that juicing will get rid of the fiber, so would alternate with eating raw veg./fruits or drinking smoothies - use as little oil as possible, even when using olive oil - can dress a salad with a mix of balsamic vinegar and lemon juice, for e.g. - use agave nectar, stevia sugar, or regular sugar rather than artificial sweateners - steam or broil/roast veggies  - snack on veggies/fruit/nuts (unsalted, preferably) when possible, rather than processed foods - reduce or eliminate aspartame in diet (it is in diet sodas, chewing gum, etc) Read the labels!  Try to read Dr. Janene Harvey book: "Program for Reversing Diabetes" for other ideas for healthy eating.

## 2014-07-29 NOTE — Progress Notes (Signed)
Patient ID: Michael Cunningham, male   DOB: 1945-07-31, 69 y.o.   MRN: [id number]  HPI: Michael Cunningham is a 69 y.o.-year-old male, referred by his PCP, Dr. Charlett Blake, for management of DM2, dx in 1999, insulin-dependent since ~2008, uncontrolled, with complications (CAD, peripheral neuropathy, DR). He has been seeing Dr. Loanne Drilling in the past, would like a second opinion.  Last hemoglobin A1c was: Lab Results  Component Value Date   HGBA1C 6.9* 07/24/2014   HGBA1C 7.9* 03/21/2014   HGBA1C 7.7* 12/26/2013   Pt is on a regimen of: - NPH 35 units at bedtime - Regular insulin 40-30-45 units He cannot afford analog insulins. He tried Metformin R and XR >> diarrhea He tried Januvia.  Pt checks his sugars 0-1 a day and they are: - am: 90-170 - 2h after b'fast: n/c - before lunch: 70-110 - 2h after lunch: n/c - before dinner: 100-150 - 2h after dinner: n/c - bedtime: 100-160 - nighttime: lows >> wake him up >> corrects them (cookie), but does not check sugars  + lows - especially with cardiac rehab (3x a week) - 1:15 pm. Lowest sugar was 63; he has hypoglycemia awareness at 90.  Highest sugar was 270  Glucometer: AccuChek  Pt's meals are: - Breakfast: cheerios + skim milk + banana - Lunch: sandwich or leftover  - Dinner: meat + veggies + starch - Snacks: PB crackers; wheat thins, apple or grapes, pear   - no CKD, last BUN/creatinine:  Lab Results  Component Value Date   BUN 18 07/24/2014   CREATININE 0.87 07/24/2014   on losartan - last set of lipids: Lab Results  Component Value Date   CHOL 171 04/16/2014   HDL 35.70* 04/16/2014   LDLCALC 106* 12/26/2013   LDLDIRECT 108.0 04/16/2014   TRIG 202.0* 04/16/2014   CHOLHDL 5 04/16/2014   on simvastatin - last eye exam was in 09/2013. + DR (mild) - no numbness and tingling in his L foot. He had a foot exam 07/23/2014 (dorsum L hallux). Very mild PN in R foot.   Pt has no FH of DM.  He also has a history of hypertension and  hyperlipidemia.  ROS: Constitutional: no weight gain/loss, no fatigue, no subjective hyperthermia/hypothermia, + poor sleep Eyes: no blurry vision, no xerophthalmia ENT: no sore throat, no nodules palpated in throat, no dysphagia/odynophagia, no hoarseness, + tinnitus Cardiovascular: no CP/SOB/palpitations/leg swelling Respiratory: no cough/SOB Gastrointestinal: no N/V/D/C Musculoskeletal: no muscle/+ joint aches Skin: no rashes Neurological: no tremors/numbness/tingling/dizziness Psychiatric: no depression/anxiety  Past Medical History  Diagnosis Date  . Hypertension   . Multiple lipomas     hx. mulitple and some remains -arms, legs  . Diverticulosis of colon     LEFT SIDE  . History of small bowel obstruction     X2  28 YRS AGO AND LAST ONE 04/2014--  RESOLVED WITHOUT SURGICAL INTERVENTION  . Type 2 diabetes mellitus   . Left ureteral calculus   . Complication of anesthesia     11'12 Surgery with excrutiating "head about to explode" upon awakening  -lasted several hours postop  ( SURGERY DONE 08-13-2011 AT [facility] PT DID OK POST OP)  . Measles =  . Hyperlipidemia   . Recurrent kidney stones 07/08/2013    Follows with Dr Gaynelle Arabian He believes they are Calcium oxalate stones  . Obesity, unspecified 09/18/2013  . CAD (coronary artery disease)     a. 05/2014 [place] s/p DES x4 to mid-distLAD and DESx1 to DI  .  Vitamin D deficiency 07/02/2014  . Thrombocytopenia 07/07/2014   Past Surgical History  Procedure Laterality Date  . Lipoma excision  1975    multiple, 10-12  . Knee arthroscopy  08/13/2011    right ARTHROSCOPY KNEE;  Surgeon: Gearlean Alf, MD;  Location: [facility];  Service: Orthopedics;  Laterality: Right;  medial  meniscal debridement, excision of plica, synovectomy  . Knee arthroscopy w/ meniscectomy Right 01-29-2011    twice, first trimmed mediscus and cyst  . Colonoscopy w/ polypectomy  LAST ONE 2013  . Knee arthroscopy Left 11/16/2013    Procedure: LEFT KNEE ARTHROSCOPY MEDIAL  MENISCAL DEBRIDEMENT, CHONDROPLASTY;  Surgeon: Gearlean Alf, MD;  Location: [facility];  Service: Orthopedics;  Laterality: Left;  . Coronary stent placement  05/17/2014    PROXIMAL X 4  MID & DISTAL LAD  DIAGONAL   . Left heart catheterization with coronary angiogram N/A 05/17/2014    Procedure: LEFT HEART CATHETERIZATION WITH CORONARY ANGIOGRAM;  Surgeon: Burnell Blanks, MD;  Location: [facility] CATH LAB;  Service: Cardiovascular;  Laterality: N/A;  . Percutaneous coronary stent intervention (pci-s)  05/17/2014    Procedure: PERCUTANEOUS CORONARY STENT INTERVENTION (PCI-S);  Surgeon: Burnell Blanks, MD;  Location: [facility] CATH LAB;  Service: Cardiovascular;;  Diag1 and Prox to Distal LAD   History   Social History  . Marital Status: Married    Spouse Name: N/A  . Number of Children: 2   Social History Main Topics  . Smoking status: Former Smoker -- 1.00 packs/day for 4 years    Types: Cigarettes    Quit date: 06/14/1967  . Smokeless tobacco: Never Used  . Alcohol Use: No  . Drug Use: No  . 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     Current Outpatient Prescriptions on File Prior to Visit  Medication Sig Dispense Refill  . acetaminophen (TYLENOL) 500 MG tablet Take 1,000 mg by mouth every 6 (six) hours as needed for mild pain.    [name] aspirin EC 81 MG tablet Take 81 mg by mouth daily with breakfast.     . HUMULIN N 100 UNIT/ML injection INJECT 35 UNITS SUBCUTANEOUSLY AT BEDTIME EVERY NIGHT 20 mL 0  . insulin regular (HUMULIN R) 100 units/mL injection 3 times a day (just before each meal), 40-30-45 units. (Patient taking differently: Inject 30-45 Units into the skin 3 (three) times daily before meals. Inject 40 units before breakfast, 30 units before lunch and 45 units before supper) 40 mL 11  . Insulin Syringe-Needle U-100 (RELION INSULIN SYR 0.5ML/31G) 31G X 5/16" 0.5 ML MISC USE SYRINGES TO  INJECT INSULIN 4 TIMES DAILY 200 each 11  . losartan (COZAAR) 50 MG tablet Take 1.5 tablets (75 mg total) by mouth daily. 1 1/2 tablets every morning 180 tablet 0  . metoprolol tartrate (LOPRESSOR) 25 MG tablet Take 1 tablet (25 mg total) by mouth 2 (two) times daily. 60 tablet 11  . Multiple Vitamin (MULTIVITAMIN WITH MINERALS) TABS tablet Take 1 tablet by mouth daily.    . nitroGLYCERIN (NITROSTAT) 0.4 MG SL tablet Place 1 tablet (0.4 mg total) under the tongue every 5 (five) minutes as needed for chest pain. 90 tablet 3  . prasugrel (EFFIENT) 10 MG TABS tablet Take 1 tablet (10 mg total) by mouth daily. 30 tablet 11  . simvastatin (ZOCOR) 40 MG tablet Take 1 tablet (40 mg total) by mouth  at bedtime. 90 tablet 1  . Vitamin D, Ergocalciferol, (DRISDOL) 50000 UNITS CAPS capsule Take 1 capsule (50,000 Units total) by mouth every 7 (seven) days. For 12 weeks (Patient taking differently: Take 50,000 Units by mouth every 7 (seven) days. For 12 weeks on Wednesdays started 05/01/14) 4 capsule 3   No current facility-administered medications on file prior to visit.   Allergies  Allergen Reactions  . Ciprofloxacin Other (See Comments)    Abdominal pain  . Lipitor [Atorvastatin Calcium] Other (See Comments)    Severe muscle pain  . Lisinopril Cough  . Metformin And Related Diarrhea   Family History  Problem Relation Age of Onset  . Hypertension Mother   . Cancer Mother 70    ovarian  . Heart disease Father     CHF  . Heart attack Father 58  . Cancer Sister     breast- just finished her last radiation  . Heart disease Maternal Grandfather     ?  [name] Heart attack Paternal Grandfather 44  . Diabetes Neg Hx   . Heart attack Paternal Uncle 56    Sudden death   PE: BP 122/70 mmHg  Pulse 64  Temp(Src) 97.5 F (36.4 C) (Oral)  Resp 12  Wt 251 lb 12.8 oz (114.216 kg)  SpO2 96% Body mass index is 36.13 kg/(m^2).  Wt Readings from Last 3 Encounters:  07/29/14 251 lb 12.8 oz (114.216 kg)   07/02/14 250 lb 4 oz (113.513 kg)  06/11/14 248 lb 3.8 oz (112.6 kg)   Constitutional: Obese, in NAD Eyes: PERRLA, EOMI, no exophthalmos ENT: moist mucous membranes, no thyromegaly, no cervical lymphadenopathy Cardiovascular: RRR, No MRG Respiratory: CTA B Gastrointestinal: abdomen soft, NT, ND, BS+ Musculoskeletal: no deformities, strength intact in all 4 Skin: moist, warm, + rash: Vitiligo on arms Neurological: no tremor with outstretched hands, DTR normal in all 4  ASSESSMENT: 1. DM2, insulin-dependent, uncontrolled, with complications - CAD, status post stent 05/2014 - Dr Percival Spanish - Peripheral neuropathy - DR  PLAN:  1. Patient with long-standing, uncontrolled diabetes (he has a normal HbA1c at last check, but low CBGs - before meals and at night), on basal-bolus antidiabetic regimen, which became insufficient. He has a large discrepancy between basal insulin (which is only used once a day, at bedtime) and his bolus insulin; and I explained that, for better diabetes control, these will need to be ~equal. He would like to have more flexibility in his mealtime insulin doses and be able to adjust his doses based on the size of the meal and the sugar before the meal. He is not checking his sugars frequently, but would like to do so, if this will help [place] decide how to optimize his diabetes management. - We discussed about options for treatment, and I suggested to decrease his insulin boluses and gave him different boluses based on the size of the meal. We also added a sliding scale of regular insulin. I will also increase his NPH to twice a day and I advised him that he can mix the NPH and regular insulin in the morning. I gave him instructions about how to mix them. I advised him to take no more than 20 units of regular insulin at the meal prior to exercise. Patient Instructions  Please change the insulin regimen as follows:  Insulin Before breakfast Before lunch Before dinner Bedtime   Regular 20-25-30 + SSI 20-25-30 + SSI 20-25-30 + SSI   NPH 20   20   Please  use: - 20 units of R insulin before a smaller meal - 25 units of R insulin before a regular meal - 30 units of R insulin before a larger meal  Please add the following Sliding scale of R insulin (SSI): - 150-175: + 1 unit  - 176-200: + 2 units  - 201-225: + 3 units  - 226-250: + 4 units  - 251-275: + 5 units - 276-300: + 6 units  Please do not use more than 20 units of R insulin before a meal prior to exercise (rehab, working in the yard, etc.)  Please inject the insulin 30 min before meals.  Please return after July 5th with your sugar log.   - I advised him to start checking sugars at different times of the day - check  at least 3 times a day, rotating checks - given sugar log and advised how to fill it and to bring it at next appt  - given foot care handout and explained the principles  - given instructions for hypoglycemia management "15-15 rule"  - advised for yearly eye exams >> He is up-to-date - Return to clinic in 2 mo with sugar log

## 2014-07-31 ENCOUNTER — Encounter (HOSPITAL_COMMUNITY)
Admission: RE | Admit: 2014-07-31 | Discharge: 2014-07-31 | Disposition: A | Discharge status: Home / Self Care | Payer: Medicare Other | Source: Ambulatory Visit | Attending: Cardiology | Admitting: Cardiology

## 2014-07-31 DIAGNOSIS — Z955 Presence of coronary angioplasty implant and graft: Secondary | ICD-10-CM | POA: Diagnosis not present

## 2014-07-31 DIAGNOSIS — Z5189 Encounter for other specified aftercare: Secondary | ICD-10-CM | POA: Diagnosis not present

## 2014-07-31 DIAGNOSIS — E785 Hyperlipidemia, unspecified: Secondary | ICD-10-CM | POA: Diagnosis not present

## 2014-07-31 DIAGNOSIS — I25110 Atherosclerotic heart disease of native coronary artery with unstable angina pectoris: Secondary | ICD-10-CM | POA: Diagnosis not present

## 2014-07-31 DIAGNOSIS — E119 Type 2 diabetes mellitus without complications: Secondary | ICD-10-CM | POA: Diagnosis not present

## 2014-07-31 DIAGNOSIS — I10 Essential (primary) hypertension: Secondary | ICD-10-CM | POA: Diagnosis not present

## 2014-08-01 ENCOUNTER — Encounter: Payer: Self-pay | Admitting: Cardiology

## 2014-08-01 ENCOUNTER — Telehealth: Payer: Self-pay | Admitting: Internal Medicine

## 2014-08-01 ENCOUNTER — Ambulatory Visit (INDEPENDENT_AMBULATORY_CARE_PROVIDER_SITE_OTHER): Payer: Medicare Other | Admitting: Cardiology

## 2014-08-01 VITALS — BP 112/68 | HR 68 | Ht 70.0 in | Wt 247.0 lb

## 2014-08-01 DIAGNOSIS — I2510 Atherosclerotic heart disease of native coronary artery without angina pectoris: Secondary | ICD-10-CM | POA: Diagnosis not present

## 2014-08-01 MED ORDER — INSULIN NPH (HUMAN) (ISOPHANE) 100 UNIT/ML ~~LOC~~ SUSP: SUBCUTANEOUS | Status: DC

## 2014-08-01 MED ORDER — INSULIN REGULAR HUMAN 100 UNIT/ML IJ SOLN: INTRAMUSCULAR | Status: DC

## 2014-08-01 NOTE — Telephone Encounter (Signed)
Patient called and would like to have his Rx sent to his pharmacy   Rx: Humulin R new dosage Humulin N ne dosage   Pharmacy: [facility]  Thank You

## 2014-08-01 NOTE — Telephone Encounter (Signed)
Done

## 2014-08-01 NOTE — Patient Instructions (Addendum)
Your physician recommends that you schedule a follow-up appointment in: one year with Dr. Percival Spanish       ( Feb 2017 )

## 2014-08-01 NOTE — Progress Notes (Signed)
Cardiology Office Note   Date:  08/01/2014   ID:  Michael Cunningham, DOB 02/06/1946, MRN [id number]  PCP:  Penni Homans, MD  Cardiologist:   Minus Breeding, MD   CAD  History of Present Illness: Michael Cunningham is a 69 y.o. male who presents for evaluation of CAD. I saw him in February and sent him for a stress test which was high risk. He subsequently was found to have severe diffuse disease in the proximal mid and distal LAD and had PTCA and DES stenting 4. He had severe stenosis in her first diagonal also treated with drug-eluting stent.  Since then he has done well. He hasn't tolerated high-dose Lipitor but has had his meds titrated. He's having blood sugar followed closely. He is participating in cardiac rehabilitation. He's had none of the discomfort he had when he was shoveling snow. He never otherwise felt bad. The patient denies any new symptoms such as chest discomfort, neck or arm discomfort. There has been no new shortness of breath, PND or orthopnea. There have been no reported palpitations, presyncope or syncope.   Past Medical History  Diagnosis Date  . Hypertension   . Multiple lipomas     hx. mulitple and some remains -arms, legs  . Diverticulosis of colon     LEFT SIDE  . History of small bowel obstruction     X2  28 YRS AGO AND LAST ONE 04/2014--  RESOLVED WITHOUT SURGICAL INTERVENTION  . Type 2 diabetes mellitus   . Left ureteral calculus   . Complication of anesthesia     11'12 Surgery with excrutiating "head about to explode" upon awakening  -lasted several hours postop  ( SURGERY DONE 08-13-2011 AT [facility] PT DID OK POST OP)  . Measles =  . Hyperlipidemia   . Recurrent kidney stones 07/08/2013    Follows with Dr Gaynelle Arabian He believes they are Calcium oxalate stones  . Obesity, unspecified 09/18/2013  . CAD (coronary artery disease)     a. 05/2014 [place] s/p DES x4 to mid-distLAD and DESx1 to DI  . Vitamin D deficiency 07/02/2014  . Thrombocytopenia 07/07/2014    Past  Surgical History  Procedure Laterality Date  . Lipoma excision  1975    multiple, 10-12  . Knee arthroscopy  08/13/2011    right ARTHROSCOPY KNEE;  Surgeon: Gearlean Alf, MD;  Location: [facility];  Service: Orthopedics;  Laterality: Right;  medial  meniscal debridement, excision of plica, synovectomy  . Knee arthroscopy w/ meniscectomy Right 01-29-2011    twice, first trimmed mediscus and cyst  . Colonoscopy w/ polypectomy  LAST ONE 2013  . Knee arthroscopy Left 11/16/2013    Procedure: LEFT KNEE ARTHROSCOPY MEDIAL MENISCAL DEBRIDEMENT, CHONDROPLASTY;  Surgeon: Gearlean Alf, MD;  Location: [facility];  Service: Orthopedics;  Laterality: Left;  . Coronary stent placement  05/17/2014    PROXIMAL X 4  MID & DISTAL LAD  DIAGONAL   . Left heart catheterization with coronary angiogram N/A 05/17/2014    Procedure: LEFT HEART CATHETERIZATION WITH CORONARY ANGIOGRAM;  Surgeon: Burnell Blanks, MD;  Location: [facility] CATH LAB;  Service: Cardiovascular;  Laterality: N/A;  . Percutaneous coronary stent intervention (pci-s)  05/17/2014    Procedure: PERCUTANEOUS CORONARY STENT INTERVENTION (PCI-S);  Surgeon: Burnell Blanks, MD;  Location: [facility] CATH LAB;  Service: Cardiovascular;;  Diag1 and Prox to Distal LAD     Current Outpatient Prescriptions  Medication Sig Dispense Refill  . acetaminophen (TYLENOL) 500 MG  tablet Take 1,000 mg by mouth every 6 (six) hours as needed for mild pain.    [name] aspirin EC 81 MG tablet Take 81 mg by mouth daily with breakfast.     . insulin NPH Human (HUMULIN N) 100 UNIT/ML injection Inject under skin 20 units 2x a day 20 mL 1  . insulin regular (HUMULIN R) 100 units/mL injection Inject under skin 20-35 units 3 times a day (just before each meal), as advised. 20 mL 2  . Insulin Syringe-Needle U-100 (RELION INSULIN SYR 0.5ML/31G) 31G X 5/16" 0.5 ML MISC USE SYRINGES TO INJECT INSULIN 4 TIMES DAILY 200 each 11  . losartan (COZAAR) 50 MG tablet Take 1.5 tablets (75 mg total) by  mouth daily. 1 1/2 tablets every morning 180 tablet 0  . metoprolol tartrate (LOPRESSOR) 25 MG tablet Take 1 tablet (25 mg total) by mouth 2 (two) times daily. 60 tablet 11  . Multiple Vitamin (MULTIVITAMIN WITH MINERALS) TABS tablet Take 1 tablet by mouth daily.    . nitroGLYCERIN (NITROSTAT) 0.4 MG SL tablet Place 1 tablet (0.4 mg total) under the tongue every 5 (five) minutes as needed for chest pain. 90 tablet 3  . prasugrel (EFFIENT) 10 MG TABS tablet Take 1 tablet (10 mg total) by mouth daily. 30 tablet 11  . simvastatin (ZOCOR) 40 MG tablet Take 1 tablet (40 mg total) by mouth at bedtime. 90 tablet 1  . Vitamin D, Ergocalciferol, (DRISDOL) 50000 UNITS CAPS capsule Take 1 capsule (50,000 Units total) by mouth every 7 (seven) days. For 12 weeks (Patient taking differently: Take 50,000 Units by mouth every 7 (seven) days. For 12 weeks on Wednesdays started 05/01/14) 4 capsule 3   No current facility-administered medications for this visit.    Allergies:   Ciprofloxacin; Lipitor; Lisinopril; and Metformin and related    ROS:  Please see the history of present illness.   Otherwise, review of systems are positive for none.   All other systems are reviewed and negative.    PHYSICAL EXAM: VS:  BP 112/68 mmHg  Pulse 68  Ht 5\' 10"  (1.778 m)  Wt 247 lb (112.038 kg)  BMI 35.44 kg/m2 , BMI Body mass index is 35.44 kg/(m^2). GENERAL:  Well appearing NECK:  No jugular venous distention, waveform within normal limits, carotid upstroke brisk and symmetric, no bruits, no thyromegaly LUNGS:  Clear to auscultation bilaterally CHEST:  Unremarkable HEART:  PMI not displaced or sustained,S1 and S2 within normal limits, no S3, no S4, no clicks, no rubs, no murmurs ABD:  Flat, positive bowel sounds normal in frequency in pitch, no bruits, no rebound, no guarding, no midline pulsatile mass, no hepatomegaly, no splenomegaly EXT:  2 plus pulses throughout, no edema, no cyanosis no clubbing   Recent  Labs: 07/24/2014: ALT 27; BUN 18; Creatinine 0.87; Hemoglobin 14.6; Platelets 180.0; Potassium 4.9; Sodium 135; TSH 0.92    Lipid Panel    Component Value Date/Time   CHOL 171 04/16/2014 1022   TRIG 202.0* 04/16/2014 1022   HDL 35.70* 04/16/2014 1022   CHOLHDL 5 04/16/2014 1022   VLDL 40.4* 04/16/2014 1022   LDLCALC 106* 12/26/2013 0838   LDLDIRECT 108.0 04/16/2014 1022      Wt Readings from Last 3 Encounters:  08/01/14 247 lb (112.038 kg)  07/29/14 251 lb 12.8 oz (114.216 kg)  07/02/14 250 lb 4 oz (113.513 kg)      Other studies Reviewed: Additional studies/ records that were reviewed today include: Hospital records. Review of the  above records demonstrates:  Please see elsewhere in the note.  See above   ASSESSMENT AND PLAN:  CAD/PCI/DES:  .  Patient is doing well. He will continue with risk reduction. No change in therapy is indicated. I will see him again in February to discuss discontinuing dual antiplatelet therapy.  HTN:  The blood pressure is at target. No change in medications is indicated. We will continue with therapeutic lifestyle changes (TLC).  OVERWEIGHT:  The patient understands the need to lose weight with diet and exercise. We have discussed specific strategies for this.   Current medicines are reviewed at length with the patient today.  The patient does not have concerns regarding medicines.  The following changes have been made:  no change   Disposition:   FU me in Feb.     Signed, Minus Breeding, MD  08/01/2014 10:27 AM    [place]

## 2014-08-02 ENCOUNTER — Encounter (HOSPITAL_COMMUNITY)
Admission: RE | Admit: 2014-08-02 | Discharge: 2014-08-02 | Disposition: A | Discharge status: Home / Self Care | Payer: Medicare Other | Source: Ambulatory Visit | Attending: Cardiology | Admitting: Cardiology

## 2014-08-02 ENCOUNTER — Telehealth: Payer: Self-pay | Admitting: Cardiology

## 2014-08-02 ENCOUNTER — Telehealth: Payer: Self-pay | Admitting: Family Medicine

## 2014-08-02 ENCOUNTER — Encounter: Payer: Self-pay | Admitting: Family Medicine

## 2014-08-02 ENCOUNTER — Encounter: Payer: Self-pay | Admitting: Internal Medicine

## 2014-08-02 ENCOUNTER — Other Ambulatory Visit: Payer: Self-pay | Admitting: *Deleted

## 2014-08-02 DIAGNOSIS — Z5189 Encounter for other specified aftercare: Secondary | ICD-10-CM | POA: Diagnosis not present

## 2014-08-02 DIAGNOSIS — I10 Essential (primary) hypertension: Secondary | ICD-10-CM | POA: Diagnosis not present

## 2014-08-02 DIAGNOSIS — I25110 Atherosclerotic heart disease of native coronary artery with unstable angina pectoris: Secondary | ICD-10-CM | POA: Diagnosis not present

## 2014-08-02 DIAGNOSIS — E785 Hyperlipidemia, unspecified: Secondary | ICD-10-CM

## 2014-08-02 DIAGNOSIS — Z955 Presence of coronary angioplasty implant and graft: Secondary | ICD-10-CM | POA: Diagnosis not present

## 2014-08-02 DIAGNOSIS — E119 Type 2 diabetes mellitus without complications: Secondary | ICD-10-CM | POA: Diagnosis not present

## 2014-08-02 MED ORDER — INSULIN REGULAR HUMAN 100 UNIT/ML IJ SOLN: INTRAMUSCULAR | Status: DC

## 2014-08-02 NOTE — Telephone Encounter (Signed)
Returned call to patient he stated he had lab work done with PCP but he needed to have lipid panel done.Order put in for fasting lipid panel.

## 2014-08-02 NOTE — Telephone Encounter (Signed)
Pt says he needs an order for his Cholesterol.Please put this order in the system,so he can get it at [facility].

## 2014-08-02 NOTE — Telephone Encounter (Signed)
Please clarify how much vitamin D he was taking when he did his blood work. If he was taking the 50000 IU weekly keep doing that but add Vit D 2000 IU caps 1 cap po daily (OTC) if he was already taking OTC also then have him increase daily amount by 1000 IU daily and recheck level in 12 weeks. Correct MAR with what ever patient says he is taking. THX Dr B    ----- Message -----     From: Donell Sievert Ewing, CMA     Sent: 08/02/2014 11:20 AM      To: Mosie Lukes, MD    Subject: FW: Non-Urgent Medical Question                       ----- Message -----     From: Lanier Ensign Dohse     Sent: 08/02/2014 10:34 AM      To: [facility] Clinical Pool    Subject: Non-Urgent Medical Question                 Do you want to talk about the Vit D options? If so, call me at [phone]. Thanks.        Michael Cunningham

## 2014-08-02 NOTE — Telephone Encounter (Signed)
Patient informed of PCP instructiion.  He currently is taking the 50,000 weekly and agreed to add 2000 IU's OTC.

## 2014-08-05 ENCOUNTER — Encounter (HOSPITAL_COMMUNITY)
Admission: RE | Admit: 2014-08-05 | Discharge: 2014-08-05 | Disposition: A | Discharge status: Home / Self Care | Payer: Medicare Other | Source: Ambulatory Visit | Attending: Cardiology | Admitting: Cardiology

## 2014-08-05 DIAGNOSIS — Z955 Presence of coronary angioplasty implant and graft: Secondary | ICD-10-CM | POA: Diagnosis not present

## 2014-08-05 DIAGNOSIS — E119 Type 2 diabetes mellitus without complications: Secondary | ICD-10-CM | POA: Diagnosis not present

## 2014-08-05 DIAGNOSIS — I25110 Atherosclerotic heart disease of native coronary artery with unstable angina pectoris: Secondary | ICD-10-CM | POA: Diagnosis not present

## 2014-08-05 DIAGNOSIS — Z5189 Encounter for other specified aftercare: Secondary | ICD-10-CM | POA: Diagnosis not present

## 2014-08-05 DIAGNOSIS — E785 Hyperlipidemia, unspecified: Secondary | ICD-10-CM | POA: Diagnosis not present

## 2014-08-05 DIAGNOSIS — I10 Essential (primary) hypertension: Secondary | ICD-10-CM | POA: Diagnosis not present

## 2014-08-07 ENCOUNTER — Encounter (HOSPITAL_COMMUNITY)
Admission: RE | Admit: 2014-08-07 | Discharge: 2014-08-07 | Disposition: A | Discharge status: Home / Self Care | Payer: Medicare Other | Source: Ambulatory Visit | Attending: Cardiology | Admitting: Cardiology

## 2014-08-07 DIAGNOSIS — Z5189 Encounter for other specified aftercare: Secondary | ICD-10-CM | POA: Diagnosis not present

## 2014-08-07 DIAGNOSIS — Z955 Presence of coronary angioplasty implant and graft: Secondary | ICD-10-CM | POA: Diagnosis not present

## 2014-08-07 DIAGNOSIS — I25110 Atherosclerotic heart disease of native coronary artery with unstable angina pectoris: Secondary | ICD-10-CM | POA: Diagnosis not present

## 2014-08-07 DIAGNOSIS — I10 Essential (primary) hypertension: Secondary | ICD-10-CM | POA: Diagnosis not present

## 2014-08-07 DIAGNOSIS — E785 Hyperlipidemia, unspecified: Secondary | ICD-10-CM | POA: Diagnosis not present

## 2014-08-07 DIAGNOSIS — E119 Type 2 diabetes mellitus without complications: Secondary | ICD-10-CM | POA: Diagnosis not present

## 2014-08-08 ENCOUNTER — Other Ambulatory Visit (INDEPENDENT_AMBULATORY_CARE_PROVIDER_SITE_OTHER): Payer: Medicare Other

## 2014-08-08 DIAGNOSIS — E785 Hyperlipidemia, unspecified: Secondary | ICD-10-CM

## 2014-08-08 LAB — LIPID PANEL
CHOLESTEROL: 126 mg/dL (ref 0–200)
HDL: 29 mg/dL — ABNORMAL LOW (ref >=40)
LDL Cholesterol: 60 mg/dL (ref 0–99)
TRIGLYCERIDES: 183 mg/dL — AB (ref <150)
Total CHOL/HDL Ratio: 4.3 Ratio
VLDL: 37 mg/dL (ref 0–40)

## 2014-08-08 NOTE — Addendum Note (Signed)
Addended by: Peggyann Shoals on: 08/08/2014 08:23 AM   Modules accepted: Orders

## 2014-08-09 ENCOUNTER — Encounter (HOSPITAL_COMMUNITY): Payer: Medicare Other

## 2014-08-13 ENCOUNTER — Encounter: Payer: Self-pay | Admitting: Family Medicine

## 2014-08-14 ENCOUNTER — Encounter (HOSPITAL_COMMUNITY)
Admission: RE | Admit: 2014-08-14 | Discharge: 2014-08-14 | Disposition: A | Discharge status: Home / Self Care | Payer: Medicare Other | Source: Ambulatory Visit | Attending: Cardiology | Admitting: Cardiology

## 2014-08-14 DIAGNOSIS — Z7982 Long term (current) use of aspirin: Secondary | ICD-10-CM | POA: Diagnosis not present

## 2014-08-14 DIAGNOSIS — I25110 Atherosclerotic heart disease of native coronary artery with unstable angina pectoris: Secondary | ICD-10-CM | POA: Insufficient documentation

## 2014-08-14 DIAGNOSIS — E119 Type 2 diabetes mellitus without complications: Secondary | ICD-10-CM | POA: Diagnosis not present

## 2014-08-14 DIAGNOSIS — Z955 Presence of coronary angioplasty implant and graft: Secondary | ICD-10-CM | POA: Insufficient documentation

## 2014-08-14 DIAGNOSIS — Z5189 Encounter for other specified aftercare: Secondary | ICD-10-CM | POA: Insufficient documentation

## 2014-08-14 DIAGNOSIS — Z794 Long term (current) use of insulin: Secondary | ICD-10-CM | POA: Insufficient documentation

## 2014-08-14 DIAGNOSIS — Z87442 Personal history of urinary calculi: Secondary | ICD-10-CM | POA: Diagnosis not present

## 2014-08-14 DIAGNOSIS — Z87891 Personal history of nicotine dependence: Secondary | ICD-10-CM | POA: Diagnosis not present

## 2014-08-14 DIAGNOSIS — E669 Obesity, unspecified: Secondary | ICD-10-CM | POA: Insufficient documentation

## 2014-08-14 DIAGNOSIS — Z79899 Other long term (current) drug therapy: Secondary | ICD-10-CM | POA: Insufficient documentation

## 2014-08-14 DIAGNOSIS — Z6834 Body mass index (BMI) 34.0-34.9, adult: Secondary | ICD-10-CM | POA: Diagnosis not present

## 2014-08-14 DIAGNOSIS — I10 Essential (primary) hypertension: Secondary | ICD-10-CM | POA: Diagnosis not present

## 2014-08-14 DIAGNOSIS — E785 Hyperlipidemia, unspecified: Secondary | ICD-10-CM | POA: Diagnosis not present

## 2014-08-16 ENCOUNTER — Encounter (HOSPITAL_COMMUNITY)
Admission: RE | Admit: 2014-08-16 | Discharge: 2014-08-16 | Disposition: A | Discharge status: Home / Self Care | Payer: Medicare Other | Source: Ambulatory Visit | Attending: Cardiology | Admitting: Cardiology

## 2014-08-16 DIAGNOSIS — Z955 Presence of coronary angioplasty implant and graft: Secondary | ICD-10-CM | POA: Diagnosis not present

## 2014-08-16 DIAGNOSIS — E119 Type 2 diabetes mellitus without complications: Secondary | ICD-10-CM | POA: Diagnosis not present

## 2014-08-16 DIAGNOSIS — E785 Hyperlipidemia, unspecified: Secondary | ICD-10-CM | POA: Diagnosis not present

## 2014-08-16 DIAGNOSIS — I10 Essential (primary) hypertension: Secondary | ICD-10-CM | POA: Diagnosis not present

## 2014-08-16 DIAGNOSIS — I25110 Atherosclerotic heart disease of native coronary artery with unstable angina pectoris: Secondary | ICD-10-CM | POA: Diagnosis not present

## 2014-08-16 DIAGNOSIS — Z5189 Encounter for other specified aftercare: Secondary | ICD-10-CM | POA: Diagnosis not present

## 2014-08-19 ENCOUNTER — Encounter (HOSPITAL_COMMUNITY)
Admission: RE | Admit: 2014-08-19 | Discharge: 2014-08-19 | Disposition: A | Discharge status: Home / Self Care | Payer: Medicare Other | Source: Ambulatory Visit | Attending: Cardiology | Admitting: Cardiology

## 2014-08-19 DIAGNOSIS — E785 Hyperlipidemia, unspecified: Secondary | ICD-10-CM | POA: Diagnosis not present

## 2014-08-19 DIAGNOSIS — Z5189 Encounter for other specified aftercare: Secondary | ICD-10-CM | POA: Diagnosis not present

## 2014-08-19 DIAGNOSIS — I25110 Atherosclerotic heart disease of native coronary artery with unstable angina pectoris: Secondary | ICD-10-CM | POA: Diagnosis not present

## 2014-08-19 DIAGNOSIS — Z955 Presence of coronary angioplasty implant and graft: Secondary | ICD-10-CM | POA: Diagnosis not present

## 2014-08-19 DIAGNOSIS — I10 Essential (primary) hypertension: Secondary | ICD-10-CM | POA: Diagnosis not present

## 2014-08-19 DIAGNOSIS — E119 Type 2 diabetes mellitus without complications: Secondary | ICD-10-CM | POA: Diagnosis not present

## 2014-08-21 ENCOUNTER — Encounter (HOSPITAL_COMMUNITY)
Admission: RE | Admit: 2014-08-21 | Discharge: 2014-08-21 | Disposition: A | Discharge status: Home / Self Care | Payer: Medicare Other | Source: Ambulatory Visit | Attending: Cardiology | Admitting: Cardiology

## 2014-08-21 DIAGNOSIS — Z5189 Encounter for other specified aftercare: Secondary | ICD-10-CM | POA: Diagnosis not present

## 2014-08-21 DIAGNOSIS — E119 Type 2 diabetes mellitus without complications: Secondary | ICD-10-CM | POA: Diagnosis not present

## 2014-08-21 DIAGNOSIS — E785 Hyperlipidemia, unspecified: Secondary | ICD-10-CM | POA: Diagnosis not present

## 2014-08-21 DIAGNOSIS — I25110 Atherosclerotic heart disease of native coronary artery with unstable angina pectoris: Secondary | ICD-10-CM | POA: Diagnosis not present

## 2014-08-21 DIAGNOSIS — Z955 Presence of coronary angioplasty implant and graft: Secondary | ICD-10-CM | POA: Diagnosis not present

## 2014-08-21 DIAGNOSIS — I10 Essential (primary) hypertension: Secondary | ICD-10-CM | POA: Diagnosis not present

## 2014-08-22 DIAGNOSIS — L80 Vitiligo: Secondary | ICD-10-CM | POA: Diagnosis not present

## 2014-08-22 DIAGNOSIS — D225 Melanocytic nevi of trunk: Secondary | ICD-10-CM | POA: Diagnosis not present

## 2014-08-22 DIAGNOSIS — D1801 Hemangioma of skin and subcutaneous tissue: Secondary | ICD-10-CM | POA: Diagnosis not present

## 2014-08-22 DIAGNOSIS — L814 Other melanin hyperpigmentation: Secondary | ICD-10-CM | POA: Diagnosis not present

## 2014-08-22 DIAGNOSIS — L570 Actinic keratosis: Secondary | ICD-10-CM | POA: Diagnosis not present

## 2014-08-22 DIAGNOSIS — L821 Other seborrheic keratosis: Secondary | ICD-10-CM | POA: Diagnosis not present

## 2014-08-23 ENCOUNTER — Encounter (HOSPITAL_COMMUNITY)
Admission: RE | Admit: 2014-08-23 | Discharge: 2014-08-23 | Disposition: A | Discharge status: Home / Self Care | Payer: Medicare Other | Source: Ambulatory Visit | Attending: Cardiology | Admitting: Cardiology

## 2014-08-23 DIAGNOSIS — Z955 Presence of coronary angioplasty implant and graft: Secondary | ICD-10-CM | POA: Diagnosis not present

## 2014-08-23 DIAGNOSIS — E785 Hyperlipidemia, unspecified: Secondary | ICD-10-CM | POA: Diagnosis not present

## 2014-08-23 DIAGNOSIS — Z5189 Encounter for other specified aftercare: Secondary | ICD-10-CM | POA: Diagnosis not present

## 2014-08-23 DIAGNOSIS — I25110 Atherosclerotic heart disease of native coronary artery with unstable angina pectoris: Secondary | ICD-10-CM | POA: Diagnosis not present

## 2014-08-23 DIAGNOSIS — E119 Type 2 diabetes mellitus without complications: Secondary | ICD-10-CM | POA: Diagnosis not present

## 2014-08-23 DIAGNOSIS — I10 Essential (primary) hypertension: Secondary | ICD-10-CM | POA: Diagnosis not present

## 2014-08-26 ENCOUNTER — Encounter (HOSPITAL_COMMUNITY)
Admission: RE | Admit: 2014-08-26 | Discharge: 2014-08-26 | Disposition: A | Discharge status: Home / Self Care | Payer: Medicare Other | Source: Ambulatory Visit | Attending: Cardiology | Admitting: Cardiology

## 2014-08-26 DIAGNOSIS — E119 Type 2 diabetes mellitus without complications: Secondary | ICD-10-CM | POA: Diagnosis not present

## 2014-08-26 DIAGNOSIS — I25110 Atherosclerotic heart disease of native coronary artery with unstable angina pectoris: Secondary | ICD-10-CM | POA: Diagnosis not present

## 2014-08-26 DIAGNOSIS — Z5189 Encounter for other specified aftercare: Secondary | ICD-10-CM | POA: Diagnosis not present

## 2014-08-26 DIAGNOSIS — Z955 Presence of coronary angioplasty implant and graft: Secondary | ICD-10-CM | POA: Diagnosis not present

## 2014-08-26 DIAGNOSIS — E785 Hyperlipidemia, unspecified: Secondary | ICD-10-CM | POA: Diagnosis not present

## 2014-08-26 DIAGNOSIS — I10 Essential (primary) hypertension: Secondary | ICD-10-CM | POA: Diagnosis not present

## 2014-08-28 ENCOUNTER — Encounter: Payer: Self-pay | Admitting: Cardiology

## 2014-08-28 ENCOUNTER — Encounter: Payer: Self-pay | Admitting: Internal Medicine

## 2014-08-28 ENCOUNTER — Encounter (HOSPITAL_COMMUNITY)
Admission: RE | Admit: 2014-08-28 | Discharge: 2014-08-28 | Disposition: A | Discharge status: Home / Self Care | Payer: Medicare Other | Source: Ambulatory Visit | Attending: Cardiology | Admitting: Cardiology

## 2014-08-28 DIAGNOSIS — Z955 Presence of coronary angioplasty implant and graft: Secondary | ICD-10-CM | POA: Diagnosis not present

## 2014-08-28 DIAGNOSIS — I10 Essential (primary) hypertension: Secondary | ICD-10-CM | POA: Diagnosis not present

## 2014-08-28 DIAGNOSIS — Z5189 Encounter for other specified aftercare: Secondary | ICD-10-CM | POA: Diagnosis not present

## 2014-08-28 DIAGNOSIS — E785 Hyperlipidemia, unspecified: Secondary | ICD-10-CM | POA: Diagnosis not present

## 2014-08-28 DIAGNOSIS — E119 Type 2 diabetes mellitus without complications: Secondary | ICD-10-CM | POA: Diagnosis not present

## 2014-08-28 DIAGNOSIS — I25110 Atherosclerotic heart disease of native coronary artery with unstable angina pectoris: Secondary | ICD-10-CM | POA: Diagnosis not present

## 2014-08-30 ENCOUNTER — Encounter (HOSPITAL_COMMUNITY)
Admission: RE | Admit: 2014-08-30 | Discharge: 2014-08-30 | Disposition: A | Discharge status: Home / Self Care | Payer: Medicare Other | Source: Ambulatory Visit | Attending: Cardiology | Admitting: Cardiology

## 2014-08-30 DIAGNOSIS — E785 Hyperlipidemia, unspecified: Secondary | ICD-10-CM | POA: Diagnosis not present

## 2014-08-30 DIAGNOSIS — I10 Essential (primary) hypertension: Secondary | ICD-10-CM | POA: Diagnosis not present

## 2014-08-30 DIAGNOSIS — Z955 Presence of coronary angioplasty implant and graft: Secondary | ICD-10-CM | POA: Diagnosis not present

## 2014-08-30 DIAGNOSIS — E119 Type 2 diabetes mellitus without complications: Secondary | ICD-10-CM | POA: Diagnosis not present

## 2014-08-30 DIAGNOSIS — Z5189 Encounter for other specified aftercare: Secondary | ICD-10-CM | POA: Diagnosis not present

## 2014-08-30 DIAGNOSIS — I25110 Atherosclerotic heart disease of native coronary artery with unstable angina pectoris: Secondary | ICD-10-CM | POA: Diagnosis not present

## 2014-09-02 ENCOUNTER — Encounter (HOSPITAL_COMMUNITY)
Admission: RE | Admit: 2014-09-02 | Discharge: 2014-09-02 | Disposition: A | Discharge status: Home / Self Care | Payer: Medicare Other | Source: Ambulatory Visit | Attending: Cardiology | Admitting: Cardiology

## 2014-09-02 DIAGNOSIS — Z955 Presence of coronary angioplasty implant and graft: Secondary | ICD-10-CM | POA: Diagnosis not present

## 2014-09-02 DIAGNOSIS — I10 Essential (primary) hypertension: Secondary | ICD-10-CM | POA: Diagnosis not present

## 2014-09-02 DIAGNOSIS — E119 Type 2 diabetes mellitus without complications: Secondary | ICD-10-CM | POA: Diagnosis not present

## 2014-09-02 DIAGNOSIS — I25110 Atherosclerotic heart disease of native coronary artery with unstable angina pectoris: Secondary | ICD-10-CM | POA: Diagnosis not present

## 2014-09-02 DIAGNOSIS — E785 Hyperlipidemia, unspecified: Secondary | ICD-10-CM | POA: Diagnosis not present

## 2014-09-02 DIAGNOSIS — Z5189 Encounter for other specified aftercare: Secondary | ICD-10-CM | POA: Diagnosis not present

## 2014-09-04 ENCOUNTER — Encounter (HOSPITAL_COMMUNITY)
Admission: RE | Admit: 2014-09-04 | Discharge: 2014-09-04 | Disposition: A | Discharge status: Home / Self Care | Payer: Medicare Other | Source: Ambulatory Visit | Attending: Cardiology | Admitting: Cardiology

## 2014-09-04 DIAGNOSIS — E119 Type 2 diabetes mellitus without complications: Secondary | ICD-10-CM | POA: Diagnosis not present

## 2014-09-04 DIAGNOSIS — Z955 Presence of coronary angioplasty implant and graft: Secondary | ICD-10-CM | POA: Diagnosis not present

## 2014-09-04 DIAGNOSIS — Z5189 Encounter for other specified aftercare: Secondary | ICD-10-CM | POA: Diagnosis not present

## 2014-09-04 DIAGNOSIS — I10 Essential (primary) hypertension: Secondary | ICD-10-CM | POA: Diagnosis not present

## 2014-09-04 DIAGNOSIS — E785 Hyperlipidemia, unspecified: Secondary | ICD-10-CM | POA: Diagnosis not present

## 2014-09-04 DIAGNOSIS — I25110 Atherosclerotic heart disease of native coronary artery with unstable angina pectoris: Secondary | ICD-10-CM | POA: Diagnosis not present

## 2014-09-06 ENCOUNTER — Encounter (HOSPITAL_COMMUNITY)
Admission: RE | Admit: 2014-09-06 | Discharge: 2014-09-06 | Disposition: A | Discharge status: Home / Self Care | Payer: Medicare Other | Source: Ambulatory Visit | Attending: Cardiology | Admitting: Cardiology

## 2014-09-06 DIAGNOSIS — Z955 Presence of coronary angioplasty implant and graft: Secondary | ICD-10-CM | POA: Diagnosis not present

## 2014-09-06 DIAGNOSIS — I25110 Atherosclerotic heart disease of native coronary artery with unstable angina pectoris: Secondary | ICD-10-CM | POA: Diagnosis not present

## 2014-09-06 DIAGNOSIS — I10 Essential (primary) hypertension: Secondary | ICD-10-CM | POA: Diagnosis not present

## 2014-09-06 DIAGNOSIS — Z5189 Encounter for other specified aftercare: Secondary | ICD-10-CM | POA: Diagnosis not present

## 2014-09-06 DIAGNOSIS — E119 Type 2 diabetes mellitus without complications: Secondary | ICD-10-CM | POA: Diagnosis not present

## 2014-09-06 DIAGNOSIS — E785 Hyperlipidemia, unspecified: Secondary | ICD-10-CM | POA: Diagnosis not present

## 2014-09-09 ENCOUNTER — Encounter (HOSPITAL_COMMUNITY)
Admission: RE | Admit: 2014-09-09 | Discharge: 2014-09-09 | Disposition: A | Discharge status: Home / Self Care | Payer: Medicare Other | Source: Ambulatory Visit | Attending: Cardiology | Admitting: Cardiology

## 2014-09-09 DIAGNOSIS — I10 Essential (primary) hypertension: Secondary | ICD-10-CM | POA: Diagnosis not present

## 2014-09-09 DIAGNOSIS — E785 Hyperlipidemia, unspecified: Secondary | ICD-10-CM | POA: Diagnosis not present

## 2014-09-09 DIAGNOSIS — Z955 Presence of coronary angioplasty implant and graft: Secondary | ICD-10-CM | POA: Diagnosis not present

## 2014-09-09 DIAGNOSIS — E119 Type 2 diabetes mellitus without complications: Secondary | ICD-10-CM | POA: Diagnosis not present

## 2014-09-09 DIAGNOSIS — I25110 Atherosclerotic heart disease of native coronary artery with unstable angina pectoris: Secondary | ICD-10-CM | POA: Diagnosis not present

## 2014-09-09 DIAGNOSIS — Z5189 Encounter for other specified aftercare: Secondary | ICD-10-CM | POA: Diagnosis not present

## 2014-09-11 ENCOUNTER — Encounter (HOSPITAL_COMMUNITY): Payer: Self-pay

## 2014-09-11 ENCOUNTER — Encounter (HOSPITAL_COMMUNITY)
Admission: RE | Admit: 2014-09-11 | Discharge: 2014-09-11 | Disposition: A | Discharge status: Home / Self Care | Payer: Medicare Other | Source: Ambulatory Visit | Attending: Cardiology | Admitting: Cardiology

## 2014-09-11 DIAGNOSIS — E785 Hyperlipidemia, unspecified: Secondary | ICD-10-CM | POA: Diagnosis not present

## 2014-09-11 DIAGNOSIS — I10 Essential (primary) hypertension: Secondary | ICD-10-CM | POA: Diagnosis not present

## 2014-09-11 DIAGNOSIS — Z955 Presence of coronary angioplasty implant and graft: Secondary | ICD-10-CM | POA: Diagnosis not present

## 2014-09-11 DIAGNOSIS — Z5189 Encounter for other specified aftercare: Secondary | ICD-10-CM | POA: Diagnosis not present

## 2014-09-11 DIAGNOSIS — I25110 Atherosclerotic heart disease of native coronary artery with unstable angina pectoris: Secondary | ICD-10-CM | POA: Diagnosis not present

## 2014-09-11 DIAGNOSIS — E119 Type 2 diabetes mellitus without complications: Secondary | ICD-10-CM | POA: Diagnosis not present

## 2014-09-11 NOTE — Progress Notes (Signed)
Pt graduated from cardiac rehab program today with completion of 36 exercise sessions in Phase II. Pt maintained good attendance and progressed nicely during his participation in rehab as evidenced by increased MET level.   Medication list reconciled. Repeat  PHQ score-0  .  Pt has made significant lifestyle changes and should be commended for his success. Pt feels he has achieved his goals during cardiac rehab, although he admits in the beginning he was unsure of projected goals and outcomes due to his new diagnosis of coronary artery disease.    Although pt has not met his weight loss goal.     Pt plans to continue exercising on his own at home.  He plans to purchase a nustep exercise machine for home use.

## 2014-09-13 ENCOUNTER — Encounter (HOSPITAL_COMMUNITY): Payer: Medicare Other

## 2014-09-16 ENCOUNTER — Encounter (HOSPITAL_COMMUNITY): Payer: Medicare Other

## 2014-09-30 ENCOUNTER — Ambulatory Visit: Payer: Medicare Other | Admitting: Family Medicine

## 2014-10-02 ENCOUNTER — Encounter: Payer: Self-pay | Admitting: Internal Medicine

## 2014-10-02 ENCOUNTER — Other Ambulatory Visit: Payer: Self-pay | Admitting: *Deleted

## 2014-10-02 ENCOUNTER — Other Ambulatory Visit: Payer: Self-pay | Admitting: Family Medicine

## 2014-10-02 ENCOUNTER — Ambulatory Visit (INDEPENDENT_AMBULATORY_CARE_PROVIDER_SITE_OTHER): Payer: Medicare Other | Admitting: Internal Medicine

## 2014-10-02 VITALS — BP 118/66 | HR 66 | Temp 97.7°F | Resp 14 | Wt 248.8 lb

## 2014-10-02 DIAGNOSIS — E1142 Type 2 diabetes mellitus with diabetic polyneuropathy: Secondary | ICD-10-CM

## 2014-10-02 DIAGNOSIS — E1140 Type 2 diabetes mellitus with diabetic neuropathy, unspecified: Secondary | ICD-10-CM

## 2014-10-02 DIAGNOSIS — I2510 Atherosclerotic heart disease of native coronary artery without angina pectoris: Secondary | ICD-10-CM

## 2014-10-02 DIAGNOSIS — E1165 Type 2 diabetes mellitus with hyperglycemia: Principal | ICD-10-CM

## 2014-10-02 DIAGNOSIS — I10 Essential (primary) hypertension: Secondary | ICD-10-CM

## 2014-10-02 MED ORDER — ACCU-CHEK FASTCLIX LANCETS MISC: Status: DC

## 2014-10-02 MED ORDER — LOSARTAN POTASSIUM 50 MG PO TABS: 75.0000 mg | ORAL_TABLET | Freq: Every day | ORAL | Status: DC

## 2014-10-02 MED ORDER — GLUCOSE BLOOD VI STRP: ORAL_STRIP | Status: DC

## 2014-10-02 NOTE — Patient Instructions (Signed)
Please change the insulin regimen as follows:  Insulin Before breakfast Before lunch Before dinner Bedtime  Regular 20-25-30 + SSI 20-25-30 + SSI 20-25-30 + SSI   NPH 20   20 >> 24   Please use: - 20 units of R insulin before a smaller meal - 25 units of R insulin before a regular meal - 30 units of R insulin before a larger meal  Please add the following Sliding scale of R insulin (SSI): - 150-175: + 1 unit  - 176-200: + 2 units  - 201-225: + 3 units  - 226-250: + 4 units  - 251-275: + 5 units - 276-300: + 6 units  Please do not use more than 20 units of R insulin before a meal prior to exercise (rehab, working in the yard, etc.)  Please inject the insulin 30 min before meals.

## 2014-10-02 NOTE — Progress Notes (Signed)
Patient ID: Michael Cunningham, male   DOB: 02-07-1946, 69 y.o.   MRN: [id number]  HPI: Michael Cunningham is a 69 y.o.-year-old male, referred by his PCP, Dr. Charlett Blake, for management of DM2, dx in 1999, insulin-dependent since ~2008, uncontrolled, with complications (CAD, peripheral neuropathy, DR). Last visit 2 mo ago.  Last hemoglobin A1c was: Lab Results  Component Value Date   HGBA1C 6.9* 07/24/2014   HGBA1C 7.9* 03/21/2014   HGBA1C 7.7* 12/26/2013   Pt is on a regimen of:  Insulin Before breakfast Before lunch Before dinner Bedtime  Regular 20-25-30 + SSI 20-25-30 + SSI 20-25-30 + SSI   NPH 20   20   Please use: - 20 units of R insulin before a smaller meal - 25 units of R insulin before a regular meal - 30 units of R insulin before a larger meal  Please add the following Sliding scale of R insulin (SSI): - 150-175: + 1 unit  - 176-200: + 2 units  - 201-225: + 3 units  - 226-250: + 4 units  - 251-275: + 5 units - 276-300: + 6 units  Please do not use more than 20 units of R insulin before a meal prior to exercise (rehab, working in the yard, etc.)  He cannot afford analog insulins. He tried Metformin R and XR >> diarrhea He tried Januvia.  Pt checks his sugars 0-1 a day and they are: - am: 90-170 >> 131-190 - 2h after b'fast: n/c >> 177  - before lunch: 70-110 >> 87-168, 250 - 2h after lunch: n/c >> 76 - before dinner: 100-150 >> 73, 89-129, 207 - 2h after dinner: n/c - bedtime: 100-160 >> 137 - nighttime: lows >> wake him up >> corrects them (cookie) >> 90, no lower sugars! + fewer lows - Lowest sugar was 63 >> now 70s; he has hypoglycemia awareness at 90.  Highest sugar was 270 >> 250  Glucometer: AccuChek  Pt's meals are: - Breakfast: cheerios + skim milk + banana - Lunch: sandwich or leftover  - Dinner: meat + veggies + starch - Snacks: PB crackers; wheat thins, apple or grapes, pear  He finished cardiac rehab.   - no CKD, last BUN/creatinine:  Lab Results   Component Value Date   BUN 18 07/24/2014   CREATININE 0.87 07/24/2014   on losartan - last set of lipids: Lab Results  Component Value Date   CHOL 126 08/08/2014   HDL 29* 08/08/2014   LDLCALC 60 08/08/2014   LDLDIRECT 108.0 04/16/2014   TRIG 183* 08/08/2014   CHOLHDL 4.3 08/08/2014   on simvastatin - last eye exam was in 09/2013. + DR (mild) - no numbness and tingling in his L foot. He had a foot exam 07/23/2014 (dorsum L hallux). Very mild PN in R foot.   He also has a history of hypertension and hyperlipidemia.  ROS: Constitutional: no weight gain/loss, no fatigue, no subjective hyperthermia/hypothermia, + poor sleep Eyes: no blurry vision, no xerophthalmia ENT: no sore throat, no nodules palpated in throat, no dysphagia/odynophagia, no hoarseness, + tinnitus Cardiovascular: no CP/SOB/palpitations/leg swelling Respiratory: no cough/SOB Gastrointestinal: no N/V/D/C Musculoskeletal: no muscle/+ joint aches Skin: no rashes Neurological: no tremors/numbness/tingling/dizziness  I reviewed pt's medications, allergies, PMH, social hx, family hx, and changes were documented in the history of present illness. Otherwise, unchanged from my initial visit note:  Past Medical History  Diagnosis Date  . Hypertension   . Multiple lipomas     hx. mulitple and some  remains -arms, legs  . Diverticulosis of colon     LEFT SIDE  . History of small bowel obstruction     X2  28 YRS AGO AND LAST ONE 04/2014--  RESOLVED WITHOUT SURGICAL INTERVENTION  . Type 2 diabetes mellitus   . Left ureteral calculus   . Complication of anesthesia     11'12 Surgery with excrutiating "head about to explode" upon awakening  -lasted several hours postop  ( SURGERY DONE 08-13-2011 AT [facility] PT DID OK POST OP)  . Measles =  . Hyperlipidemia   . Recurrent kidney stones 07/08/2013    Follows with Dr Gaynelle Arabian He believes they are Calcium oxalate stones  . Obesity, unspecified 09/18/2013  . CAD (coronary artery  disease)     a. 05/2014 [place] s/p DES x4 to mid-distLAD and DESx1 to DI  . Vitamin D deficiency 07/02/2014  . Thrombocytopenia 07/07/2014   Past Surgical History  Procedure Laterality Date  . Lipoma excision  1975    multiple, 10-12  . Knee arthroscopy  08/13/2011    right ARTHROSCOPY KNEE;  Surgeon: Gearlean Alf, MD;  Location: [facility];  Service: Orthopedics;  Laterality: Right;  medial  meniscal debridement, excision of plica, synovectomy  . Knee arthroscopy w/ meniscectomy Right 01-29-2011    twice, first trimmed mediscus and cyst  . Colonoscopy w/ polypectomy  LAST ONE 2013  . Knee arthroscopy Left 11/16/2013    Procedure: LEFT KNEE ARTHROSCOPY MEDIAL MENISCAL DEBRIDEMENT, CHONDROPLASTY;  Surgeon: Gearlean Alf, MD;  Location: [facility];  Service: Orthopedics;  Laterality: Left;  . Coronary stent placement  05/17/2014    PROXIMAL X 4  MID & DISTAL LAD  DIAGONAL   . Left heart catheterization with coronary angiogram N/A 05/17/2014    Procedure: LEFT HEART CATHETERIZATION WITH CORONARY ANGIOGRAM;  Surgeon: Burnell Blanks, MD;  Location[facility] CATH LAB;  Service: Cardiovascular;  Laterality: N/A;  . Percutaneous coronary stent intervention (pci-s)  05/17/2014    Procedure: PERCUTANEOUS CORONARY STENT INTERVENTION (PCI-S);  Surgeon: Burnell Blanks, MD;  Location: [facility] CATH LAB;  Service: Cardiovascular;;  Diag1 and Prox to Distal LAD   History   Social History  . Marital Status: Married    Spouse Name: N/A  . Number of Children: 2   Social History Main Topics  . Smoking status: Former Smoker -- 1.00 packs/day for 4 years    Types: Cigarettes    Quit date: 06/14/1967  . Smokeless tobacco: Never Used  . Alcohol Use: No  . Drug Use: No  . 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     Current Outpatient Prescriptions on File Prior to Visit  Medication Sig Dispense Refill  . acetaminophen  (TYLENOL) 500 MG tablet Take 1,000 mg by mouth every 6 (six) hours as needed for mild pain.    [name] aspirin EC 81 MG tablet Take 81 mg by mouth daily with breakfast.     . cholecalciferol (VITAMIN D) 1000 UNITS tablet Take 2,000 Units by mouth daily.    . insulin NPH Human (HUMULIN N) 100 UNIT/ML injection Inject under skin 20 units 2x a day 20 mL 1  . insulin regular (HUMULIN R) 100 units/mL injection Inject under skin 20-35 units 3 times a day (just before each meal), as advised. 30 mL 2  . Insulin Syringe-Needle U-100 (RELION INSULIN SYR 0.5ML/31G) 31G X 5/16" 0.5 ML  MISC USE SYRINGES TO INJECT INSULIN 4 TIMES DAILY 200 each 11  . losartan (COZAAR) 50 MG tablet Take 1.5 tablets (75 mg total) by mouth daily. 1 1/2 tablets every morning 180 tablet 0  . metoprolol tartrate (LOPRESSOR) 25 MG tablet Take 1 tablet (25 mg total) by mouth 2 (two) times daily. 60 tablet 11  . Multiple Vitamin (MULTIVITAMIN WITH MINERALS) TABS tablet Take 1 tablet by mouth daily.    . nitroGLYCERIN (NITROSTAT) 0.4 MG SL tablet Place 1 tablet (0.4 mg total) under the tongue every 5 (five) minutes as needed for chest pain. 90 tablet 3  . prasugrel (EFFIENT) 10 MG TABS tablet Take 1 tablet (10 mg total) by mouth daily. 30 tablet 11  . simvastatin (ZOCOR) 40 MG tablet Take 1 tablet (40 mg total) by mouth at bedtime. 90 tablet 1  . Vitamin D, Ergocalciferol, (DRISDOL) 50000 UNITS CAPS capsule Take 1 capsule (50,000 Units total) by mouth every 7 (seven) days. For 12 weeks (Patient taking differently: Take 50,000 Units by mouth every 7 (seven) days. For 12 weeks on Wednesdays started 05/01/14) 4 capsule 3   No current facility-administered medications on file prior to visit.   Allergies  Allergen Reactions  . Ciprofloxacin Other (See Comments)    Abdominal pain  . Lipitor [Atorvastatin Calcium] Other (See Comments)    Severe muscle pain  . Lisinopril Cough  . Metformin And Related Diarrhea   Family History  Problem Relation  Age of Onset  . Hypertension Mother   . Cancer Mother 34    ovarian  . Heart disease Father     CHF  . Heart attack Father 51  . Cancer Sister     breast- just finished her last radiation  . Heart disease Maternal Grandfather     ?  [name] Heart attack Paternal Grandfather 39  . Diabetes Neg Hx   . Heart attack Paternal Uncle 61    Sudden death   PE: BP 118/66 mmHg  Pulse 66  Temp(Src) 97.7 F (36.5 C) (Oral)  Resp 14  Wt 248 lb 12.8 oz (112.855 kg)  SpO2 97% Body mass index is 35.7 kg/(m^2).  Wt Readings from Last 3 Encounters:  10/02/14 248 lb 12.8 oz (112.855 kg)  08/01/14 247 lb (112.038 kg)  07/29/14 251 lb 12.8 oz (114.216 kg)   Constitutional: Obese, in NAD Eyes: PERRLA, EOMI, no exophthalmos ENT: moist mucous membranes, no thyromegaly, no cervical lymphadenopathy Cardiovascular: RRR, No MRG Respiratory: CTA B Gastrointestinal: abdomen soft, NT, ND, BS+ Musculoskeletal: no deformities, strength intact in all 4 Skin: moist, warm, + rash: Vitiligo on arms Neurological: no tremor with outstretched hands, DTR normal in all 4  ASSESSMENT: 1. DM2, insulin-dependent, uncontrolled, with complications - CAD, status post stent 05/2014 - Dr Percival Spanish - Peripheral neuropathy - DR  PLAN:  1. Patient with long-standing, uncontrolled diabetes (he has a normal HbA1c at last check, but low CBGs - before meals and at night), on basal-bolus antidiabetic regimen >> I decreased his insulin boluses and gave him different boluses based on the size of the meal. We also added a sliding scale of regular insulin. Sugars better from the point of view of low CBGs but higher in am >> will increase NPH at bedtime. Patient Instructions   Please change the insulin regimen as follows:  Insulin Before breakfast Before lunch Before dinner Bedtime  Regular 20-25-30 + SSI 20-25-30 + SSI 20-25-30 + SSI   NPH 20   20 >> 24  Please use: - 20 units of R insulin before a smaller meal - 25 units of R  insulin before a regular meal - 30 units of R insulin before a larger meal  Please add the following Sliding scale of R insulin (SSI): - 150-175: + 1 unit  - 176-200: + 2 units  - 201-225: + 3 units  - 226-250: + 4 units  - 251-275: + 5 units - 276-300: + 6 units  Please do not use more than 20 units of R insulin before a meal prior to exercise (rehab, working in the yard, etc.)  Please inject the insulin 30 min before meals.   - I advised him to continue to check  at least 3 times a day, rotating checks - advised for yearly eye exams >> He is due >> will have this next week - Return to clinic in 3 mo with sugar log >> will check hbA1c then

## 2014-10-03 ENCOUNTER — Encounter: Payer: Self-pay | Admitting: Family Medicine

## 2014-10-03 ENCOUNTER — Other Ambulatory Visit: Payer: Self-pay | Admitting: Family Medicine

## 2014-10-03 ENCOUNTER — Ambulatory Visit (INDEPENDENT_AMBULATORY_CARE_PROVIDER_SITE_OTHER): Payer: Medicare Other | Admitting: Family Medicine

## 2014-10-03 VITALS — BP 122/78 | HR 68 | Temp 97.6°F | Resp 16 | Ht 70.0 in | Wt 250.4 lb

## 2014-10-03 DIAGNOSIS — E785 Hyperlipidemia, unspecified: Secondary | ICD-10-CM | POA: Diagnosis not present

## 2014-10-03 DIAGNOSIS — E119 Type 2 diabetes mellitus without complications: Secondary | ICD-10-CM

## 2014-10-03 DIAGNOSIS — E669 Obesity, unspecified: Secondary | ICD-10-CM

## 2014-10-03 DIAGNOSIS — E1169 Type 2 diabetes mellitus with other specified complication: Secondary | ICD-10-CM

## 2014-10-03 DIAGNOSIS — I2510 Atherosclerotic heart disease of native coronary artery without angina pectoris: Secondary | ICD-10-CM | POA: Diagnosis not present

## 2014-10-03 DIAGNOSIS — E559 Vitamin D deficiency, unspecified: Secondary | ICD-10-CM | POA: Diagnosis not present

## 2014-10-03 DIAGNOSIS — I10 Essential (primary) hypertension: Secondary | ICD-10-CM | POA: Diagnosis not present

## 2014-10-03 NOTE — Patient Instructions (Signed)

## 2014-10-03 NOTE — Progress Notes (Signed)
Michael Cunningham  [id number] [birth year] 10/03/2014      Progress Note-Follow Up  Subjective  Chief Complaint  Chief Complaint  Patient presents with  . Follow-up    3 months-pt states doing good    HPI  Patient is a 69 y.o. male in today for routine medical care. Patient feels well. He feels stronger and more steady with cardiac rehab. Says he feels better than he has for quite some time. Denies polyuria and polydipsia, acknowledges his blood sugars do fluctuate some. Denies CP/palp/SOB/HA/congestion/fevers/GI or GU c/o. Taking meds as prescribed  Past Medical History  Diagnosis Date  . Hypertension   . Multiple lipomas     hx. mulitple and some remains -arms, legs  . Diverticulosis of colon     LEFT SIDE  . History of small bowel obstruction     X2  28 YRS AGO AND LAST ONE 04/2014--  RESOLVED WITHOUT SURGICAL INTERVENTION  . Type 2 diabetes mellitus   . Left ureteral calculus   . Complication of anesthesia     11'12 Surgery with excrutiating "head about to explode" upon awakening  -lasted several hours postop  ( SURGERY DONE 08-13-2011 AT [facility] PT DID OK POST OP)  . Measles =  . Hyperlipidemia   . Recurrent kidney stones 07/08/2013    Follows with Dr Gaynelle Arabian He believes they are Calcium oxalate stones  . Obesity, unspecified 09/18/2013  . CAD (coronary artery disease)     a. 05/2014 [place] s/p DES x4 to mid-distLAD and DESx1 to DI  . Vitamin D deficiency 07/02/2014  . Thrombocytopenia 07/07/2014    Past Surgical History  Procedure Laterality Date  . Lipoma excision  1975    multiple, 10-12  . Knee arthroscopy  08/13/2011    right ARTHROSCOPY KNEE;  Surgeon: Gearlean Alf, MD;  Location: [facility];  Service: Orthopedics;  Laterality: Right;  medial  meniscal debridement, excision of plica, synovectomy  . Knee arthroscopy w/ meniscectomy Right 01-29-2011    twice, first trimmed mediscus and cyst  . Colonoscopy w/ polypectomy  LAST ONE 2013  . Knee arthroscopy Left 11/16/2013   Procedure: LEFT KNEE ARTHROSCOPY MEDIAL MENISCAL DEBRIDEMENT, CHONDROPLASTY;  Surgeon: Gearlean Alf, MD;  Location: [facility];  Service: Orthopedics;  Laterality: Left;  . Coronary stent placement  05/17/2014    PROXIMAL X 4  MID & DISTAL LAD  DIAGONAL   . Left heart catheterization with coronary angiogram N/A 05/17/2014    Procedure: LEFT HEART CATHETERIZATION WITH CORONARY ANGIOGRAM;  Surgeon: Burnell Blanks, MD;  Location: [facility] CATH LAB;  Service: Cardiovascular;  Laterality: N/A;  . Percutaneous coronary stent intervention (pci-s)  05/17/2014    Procedure: PERCUTANEOUS CORONARY STENT INTERVENTION (PCI-S);  Surgeon: Burnell Blanks, MD;  Location: [facility] CATH LAB;  Service: Cardiovascular;;  Diag1 and Prox to Distal LAD    Family History  Problem Relation Age of Onset  . Hypertension Mother   . Cancer Mother 66    ovarian  . Heart disease Father     CHF  . Heart attack Father 49  . Cancer Sister     breast- just finished her last radiation  . Heart disease Maternal Grandfather     ?  [name] Heart attack Paternal Grandfather 16  . Diabetes Neg Hx   . Heart attack Paternal Uncle 33    Sudden death    History   Social History  . Marital Status: Married    Spouse Name: N/A  .  Number of Children: 2  . Years of Education: N/A   Occupational History  . Not on file.   Social History Main Topics  . Smoking status: Former Smoker -- 1.00 packs/day for 4 years    Types: Cigarettes    Quit date: 06/14/1967  . Smokeless tobacco: Never Used  . Alcohol Use: No  . Drug Use: No  . Sexual Activity: Yes     Comment: lives with wife   Other Topics Concern  . 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      Current Outpatient Prescriptions on File Prior to Visit  Medication Sig Dispense Refill  . ACCU-CHEK FASTCLIX LANCETS MISC Use to test blood sugar 2 times daily. Dx: E11.40 102 each 11  . acetaminophen (TYLENOL) 500 MG tablet  Take 1,000 mg by mouth every 6 (six) hours as needed for mild pain.    [name] aspirin EC 81 MG tablet Take 81 mg by mouth daily with breakfast.     . cholecalciferol (VITAMIN D) 1000 UNITS tablet Take 2,000 Units by mouth daily.    [name] glucose blood (ACCU-CHEK AVIVA PLUS) test strip Use to test blood sugar 2 times daily. Dx: E11.40 100 each 11  . insulin NPH Human (HUMULIN N) 100 UNIT/ML injection Inject under skin 20 units 2x a day 20 mL 1  . insulin regular (HUMULIN R) 100 units/mL injection Inject under skin 20-35 units 3 times a day (just before each meal), as advised. 30 mL 2  . Insulin Syringe-Needle U-100 (RELION INSULIN SYR 0.5ML/31G) 31G X 5/16" 0.5 ML MISC USE SYRINGES TO INJECT INSULIN 4 TIMES DAILY 200 each 11  . losartan (COZAAR) 50 MG tablet Take 1.5 tablets (75 mg total) by mouth daily. 1 1/2 tablets every morning 180 tablet 0  . metoprolol tartrate (LOPRESSOR) 25 MG tablet Take 1 tablet (25 mg total) by mouth 2 (two) times daily. 60 tablet 11  . Multiple Vitamin (MULTIVITAMIN WITH MINERALS) TABS tablet Take 1 tablet by mouth daily.    . nitroGLYCERIN (NITROSTAT) 0.4 MG SL tablet Place 1 tablet (0.4 mg total) under the tongue every 5 (five) minutes as needed for chest pain. 90 tablet 3  . prasugrel (EFFIENT) 10 MG TABS tablet Take 1 tablet (10 mg total) by mouth daily. 30 tablet 11  . simvastatin (ZOCOR) 40 MG tablet Take 1 tablet (40 mg total) by mouth at bedtime. 90 tablet 1  . TRUEPLUS INSULIN SYRINGE 30G X 5/16" 0.5 ML MISC     . Vitamin D, Ergocalciferol, (DRISDOL) 50000 UNITS CAPS capsule Take 1 capsule (50,000 Units total) by mouth every 7 (seven) days. For 12 weeks (Patient taking differently: Take 50,000 Units by mouth every 7 (seven) days. For 12 weeks on Wednesdays started 05/01/14) 4 capsule 3   No current facility-administered medications on file prior to visit.    Allergies  Allergen Reactions  . Ciprofloxacin Other (See Comments)    Abdominal pain  . Lipitor [Atorvastatin  Calcium] Other (See Comments)    Severe muscle pain  . Lisinopril Cough  . Metformin And Related Diarrhea    Review of Systems  Review of Systems  Constitutional: Negative for fever, chills and malaise/fatigue.  HENT: Negative for congestion, hearing loss and nosebleeds.   Eyes: Negative for discharge.  Respiratory: Negative for cough, sputum production, shortness of breath and wheezing.   Cardiovascular: Negative for chest pain, palpitations and leg swelling.  Gastrointestinal: Negative for  heartburn, nausea, vomiting, abdominal pain, diarrhea, constipation and blood in stool.  Genitourinary: Negative for dysuria, urgency, frequency and hematuria.  Musculoskeletal: Negative for myalgias, back pain and falls.  Skin: Negative for rash.  Neurological: Negative for dizziness, tremors, sensory change, focal weakness, loss of consciousness, weakness and headaches.  Endo/Heme/Allergies: Negative for polydipsia. Does not bruise/bleed easily.  Psychiatric/Behavioral: Negative for depression and suicidal ideas. The patient is not nervous/anxious and does not have insomnia.     Objective  BP 122/78 mmHg  Pulse 68  Temp(Src) 97.6 F (36.4 C) (Oral)  Resp 16  Ht 5\' 10"  (1.778 m)  Wt 250 lb 6.4 oz (113.581 kg)  BMI 35.93 kg/m2  SpO2 98%  Physical Exam  Physical Exam  Constitutional: He is oriented to person, place, and time and well-developed, well-nourished, and in no distress. No distress.  HENT:  Head: Normocephalic and atraumatic.  Eyes: Conjunctivae are normal.  Neck: Neck supple. No thyromegaly present.  Cardiovascular: Normal rate, regular rhythm and normal heart sounds.   No murmur heard. Pulmonary/Chest: Effort normal and breath sounds normal. No respiratory distress.  Abdominal: He exhibits no distension and no mass. There is no tenderness.  Musculoskeletal: He exhibits no edema.  Neurological: He is alert and oriented to person, place, and time.  Skin: Skin is warm.    Psychiatric: Memory, affect and judgment normal.    Lab Results  Component Value Date   TSH 0.92 07/24/2014   Lab Results  Component Value Date   WBC 5.6 07/24/2014   HGB 14.6 07/24/2014   HCT 42.4 07/24/2014   MCV 90.5 07/24/2014   PLT 180.0 07/24/2014   Lab Results  Component Value Date   CREATININE 0.87 07/24/2014   BUN 18 07/24/2014   NA 135 07/24/2014   K 4.9 07/24/2014   CL 103 07/24/2014   CO2 25 07/24/2014   Lab Results  Component Value Date   ALT 27 07/24/2014   AST 23 07/24/2014   ALKPHOS 86 07/24/2014   BILITOT 0.7 07/24/2014   Lab Results  Component Value Date   CHOL 126 08/08/2014   Lab Results  Component Value Date   HDL 29* 08/08/2014   Lab Results  Component Value Date   LDLCALC 60 08/08/2014   Lab Results  Component Value Date   TRIG 183* 08/08/2014   Lab Results  Component Value Date   CHOLHDL 4.3 08/08/2014     Assessment & Plan  Diabetes mellitus type 2 in obese hgba1c acceptable, minimize simple carbs. Increase exercise as tolerated. Continue current meds. Has seen numbers raning from 110 to 190, generally greater than 150. Encouraged to eat small, frequent meals with lean proteins and minimal carbs. Humulin N 20 in am and 24 in pm and referred to endocrinology  CAD (coronary artery disease) Has undergone cardiac rehab and he feels that has increased his stamina  Hypertension Well controlled, no changes to meds. Encouraged heart healthy diet such as the DASH diet and exercise as tolerated.   Hyperlipidemia Tolerating statin, encouraged heart healthy diet, avoid trans fats, minimize simple carbs and saturated fats. Increase exercise as tolerated  Obesity Encouraged DASH diet, decrease po intake and increase exercise as tolerated. Needs 7-8 hours of sleep nightly. Avoid trans fats, eat small, frequent meals every 4-5 hours with lean proteins, complex carbs and healthy fats. Minimize simple carbs, GMO foods.

## 2014-10-03 NOTE — Progress Notes (Signed)
Pre visit review using our clinic review tool, if applicable. No additional management support is needed unless otherwise documented below in the visit note. 

## 2014-10-07 ENCOUNTER — Encounter: Payer: Self-pay | Admitting: Cardiology

## 2014-10-08 DIAGNOSIS — H40033 Anatomical narrow angle, bilateral: Secondary | ICD-10-CM | POA: Diagnosis not present

## 2014-10-08 LAB — HM DIABETES EYE EXAM

## 2014-10-09 ENCOUNTER — Encounter: Payer: Self-pay | Admitting: Family Medicine

## 2014-10-09 ENCOUNTER — Other Ambulatory Visit: Payer: Self-pay | Admitting: Family Medicine

## 2014-10-09 MED ORDER — VITAMIN D (ERGOCALCIFEROL) 1.25 MG (50000 UNIT) PO CAPS: 50000.0000 [IU] | ORAL_CAPSULE | ORAL | Status: DC

## 2014-10-19 NOTE — Assessment & Plan Note (Signed)
Well controlled, no changes to meds. Encouraged heart healthy diet such as the DASH diet and exercise as tolerated.  °

## 2014-10-19 NOTE — Assessment & Plan Note (Signed)
Encouraged DASH diet, decrease po intake and increase exercise as tolerated. Needs 7-8 hours of sleep nightly. Avoid trans fats, eat small, frequent meals every 4-5 hours with lean proteins, complex carbs and healthy fats. Minimize simple carbs, GMO foods. 

## 2014-10-19 NOTE — Assessment & Plan Note (Signed)
Has undergone cardiac rehab and he feels that has increased his stamina

## 2014-10-19 NOTE — Assessment & Plan Note (Addendum)
hgba1c acceptable, minimize simple carbs. Increase exercise as tolerated. Continue current meds. Has seen numbers raning from 110 to 190, generally greater than 150. Encouraged to eat small, frequent meals with lean proteins and minimal carbs. Humulin N 20 in am and 24 in pm and referred to endocrinology

## 2014-10-19 NOTE — Assessment & Plan Note (Signed)
Tolerating statin, encouraged heart healthy diet, avoid trans fats, minimize simple carbs and saturated fats. Increase exercise as tolerated 

## 2014-10-31 DIAGNOSIS — H40031 Anatomical narrow angle, right eye: Secondary | ICD-10-CM | POA: Diagnosis not present

## 2014-10-31 DIAGNOSIS — H40033 Anatomical narrow angle, bilateral: Secondary | ICD-10-CM | POA: Diagnosis not present

## 2014-11-04 ENCOUNTER — Other Ambulatory Visit: Payer: Self-pay | Admitting: Internal Medicine

## 2014-11-07 DIAGNOSIS — H40033 Anatomical narrow angle, bilateral: Secondary | ICD-10-CM | POA: Diagnosis not present

## 2014-11-07 DIAGNOSIS — H40032 Anatomical narrow angle, left eye: Secondary | ICD-10-CM | POA: Diagnosis not present

## 2014-11-19 ENCOUNTER — Encounter: Payer: Self-pay | Admitting: Podiatry

## 2014-11-19 ENCOUNTER — Ambulatory Visit (INDEPENDENT_AMBULATORY_CARE_PROVIDER_SITE_OTHER): Payer: Medicare Other | Admitting: Podiatry

## 2014-11-19 VITALS — BP 154/82 | HR 64 | Resp 12

## 2014-11-19 DIAGNOSIS — M79676 Pain in unspecified toe(s): Secondary | ICD-10-CM

## 2014-11-19 DIAGNOSIS — I2510 Atherosclerotic heart disease of native coronary artery without angina pectoris: Secondary | ICD-10-CM | POA: Diagnosis not present

## 2014-11-19 DIAGNOSIS — B351 Tinea unguium: Secondary | ICD-10-CM | POA: Diagnosis not present

## 2014-11-19 NOTE — Patient Instructions (Signed)
Today your diabetic foot screen examination found your pulsations adequate in your right and left feet Your protective sensation, ability feel the fascial line tuning fork was satisfactory The toenails 10 had of varying degrees of fungal infection which need to be debrided at approximately three-month intervals  Diabetes and Foot Care Diabetes may cause you to have problems because of poor blood supply (circulation) to your feet and legs. This may cause the skin on your feet to become thinner, break easier, and heal more slowly. Your skin may become dry, and the skin may peel and crack. You may also have nerve damage in your legs and feet causing decreased feeling in them. You may not notice minor injuries to your feet that could lead to infections or more serious problems. Taking care of your feet is one of the most important things you can do for yourself.  HOME CARE INSTRUCTIONS  Wear shoes at all times, even in the house. Do not go barefoot. Bare feet are easily injured.  Check your feet daily for blisters, cuts, and redness. If you cannot see the bottom of your feet, use a mirror or ask someone for help.  Wash your feet with warm water (do not use hot water) and mild soap. Then pat your feet and the areas between your toes until they are completely dry. Do not soak your feet as this can dry your skin.  Apply a moisturizing lotion or petroleum jelly (that does not contain alcohol and is unscented) to the skin on your feet and to dry, brittle toenails. Do not apply lotion between your toes.  Trim your toenails straight across. Do not dig under them or around the cuticle. File the edges of your nails with an emery board or nail file.  Do not cut corns or calluses or try to remove them with medicine.  Wear clean socks or stockings every day. Make sure they are not too tight. Do not wear knee-high stockings since they may decrease blood flow to your legs.  Wear shoes that fit properly and  have enough cushioning. To break in new shoes, wear them for just a few hours a day. This prevents you from injuring your feet. Always look in your shoes before you put them on to be sure there are no objects inside.  Do not cross your legs. This may decrease the blood flow to your feet.  If you find a minor scrape, cut, or break in the skin on your feet, keep it and the skin around it clean and dry. These areas may be cleansed with mild soap and water. Do not cleanse the area with peroxide, alcohol, or iodine.  When you remove an adhesive bandage, be sure not to damage the skin around it.  If you have a wound, look at it several times a day to make sure it is healing.  Do not use heating pads or hot water bottles. They may burn your skin. If you have lost feeling in your feet or legs, you may not know it is happening until it is too late.  Make sure your health care provider performs a complete foot exam at least annually or more often if you have foot problems. Report any cuts, sores, or bruises to your health care provider immediately. SEEK MEDICAL CARE IF:   You have an injury that is not healing.  You have cuts or breaks in the skin.  You have an ingrown nail.  You notice redness on your legs  or feet.  You feel burning or tingling in your legs or feet.  You have pain or cramps in your legs and feet.  Your legs or feet are numb.  Your feet always feel cold. SEEK IMMEDIATE MEDICAL CARE IF:   There is increasing redness, swelling, or pain in or around a wound.  There is a red line that goes up your leg.  Pus is coming from a wound.  You develop a fever or as directed by your health care provider.  You notice a bad smell coming from an ulcer or wound. Document Released: 02/27/2000 Document Revised: 11/01/2012 Document Reviewed: 08/08/2012 [facility] Patient Information 2015 [place], [place]. This information is not intended to replace advice given to you by your health care  provider. Make sure you discuss any questions you have with your health care provider.

## 2014-11-19 NOTE — Progress Notes (Signed)
   Subjective:    Patient ID: Michael Cunningham, male    DOB: 03-28-1945, 69 y.o.   MRN: [id number]  HPI  This patient presents today complaining of several year history of thickening toenails which are all comfortable walking wearing shoes. He is attempted to trim the nails himself, however, he is not able to reduce the discomfort in the toenails. He denies any previous infection or professional treatment. Patient is requesting nail debridement today  Review of Systems  All other systems reviewed and are negative.  Patient denies any history of foot ulceration, claudication or amputation    Objective:   Physical Exam  Vascular: Mild pitting edema bilaterally DP pulses 1/4 bilaterally DP pulses 2/4 bilaterally Capillary reflex immediate bilaterally  Neurological: Sensation to 10 g monofilament wire intact 5/5 bilaterally Vibratory sensation reactive bilaterally Ankle reflex equal and reactive bilaterally  Dermatological: Atrophic skin bilaterally No corns or calluses noted bilaterally No open wounds noted bilaterally The toenails are discolored, hypertrophic, incurvated and tender to direct palpation 6-10  Musculoskeletal: No deformities noted bilaterally There is no restriction ankle, subtalar or midtarsal joints bilaterally      Assessment & Plan:   Assessment: Satisfactory neurovascular status Diabetic without complications Symptomatic onychomycoses 6-10  Plan: Today I reviewed the results of the examination with patient today. The toenails 10 were debrided mechanically and electrically without any bleeding  Reappoint 3 months

## 2014-11-26 DIAGNOSIS — H35342 Macular cyst, hole, or pseudohole, left eye: Secondary | ICD-10-CM | POA: Diagnosis not present

## 2014-11-26 DIAGNOSIS — H3342 Traction detachment of retina, left eye: Secondary | ICD-10-CM | POA: Diagnosis not present

## 2014-11-26 LAB — HM DIABETES EYE EXAM

## 2014-11-28 ENCOUNTER — Encounter: Payer: Self-pay | Admitting: Internal Medicine

## 2014-12-02 ENCOUNTER — Encounter: Payer: Self-pay | Admitting: Family Medicine

## 2014-12-02 ENCOUNTER — Other Ambulatory Visit: Payer: Self-pay | Admitting: Family Medicine

## 2014-12-03 ENCOUNTER — Telehealth: Payer: Self-pay | Admitting: *Deleted

## 2014-12-03 NOTE — Telephone Encounter (Signed)
Requesting surgical clearance:   1. Type of surgery: Colonoscopy (history of colon polyps)  2. Surgeon: Dr Watt Climes  3. Surgical date: Pending  4. Medications that need to be help: Effient  Pt saw you on 08/01/2014, Is pt cleared for surgery?

## 2014-12-04 NOTE — Telephone Encounter (Signed)
Clearance send via epic to [facility] physicians and Dr Watt Climes

## 2014-12-04 NOTE — Telephone Encounter (Signed)
He should not hold the Effient for an elective procedure at this time.  He would be cleared, pending a follow up office visit, in March of next year potentially.

## 2014-12-19 ENCOUNTER — Other Ambulatory Visit (INDEPENDENT_AMBULATORY_CARE_PROVIDER_SITE_OTHER): Payer: Medicare Other

## 2014-12-19 DIAGNOSIS — E669 Obesity, unspecified: Secondary | ICD-10-CM | POA: Diagnosis not present

## 2014-12-19 DIAGNOSIS — E785 Hyperlipidemia, unspecified: Secondary | ICD-10-CM | POA: Diagnosis not present

## 2014-12-19 DIAGNOSIS — E119 Type 2 diabetes mellitus without complications: Secondary | ICD-10-CM

## 2014-12-19 DIAGNOSIS — E559 Vitamin D deficiency, unspecified: Secondary | ICD-10-CM | POA: Diagnosis not present

## 2014-12-19 DIAGNOSIS — I10 Essential (primary) hypertension: Secondary | ICD-10-CM | POA: Diagnosis not present

## 2014-12-19 DIAGNOSIS — E1169 Type 2 diabetes mellitus with other specified complication: Secondary | ICD-10-CM

## 2014-12-19 LAB — COMPREHENSIVE METABOLIC PANEL
ALT: 24 U/L (ref 0–53)
AST: 21 U/L (ref 0–37)
Albumin: 3.9 g/dL (ref 3.5–5.2)
Alkaline Phosphatase: 79 U/L (ref 39–117)
BILIRUBIN TOTAL: 0.7 mg/dL (ref 0.2–1.2)
BUN: 20 mg/dL (ref 6–23)
CO2: 27 meq/L (ref 19–32)
Calcium: 8.8 mg/dL (ref 8.4–10.5)
Chloride: 105 mEq/L (ref 96–112)
Creatinine, Ser: 0.84 mg/dL (ref 0.40–1.50)
GFR: 95.88 mL/min (ref >60.00)
GLUCOSE: 198 mg/dL — AB (ref 70–99)
Potassium: 4.4 mEq/L (ref 3.5–5.1)
Sodium: 140 mEq/L (ref 135–145)
Total Protein: 6.8 g/dL (ref 6.0–8.3)

## 2014-12-19 LAB — LIPID PANEL
CHOL/HDL RATIO: 3
CHOLESTEROL: 126 mg/dL (ref 0–200)
HDL: 36.1 mg/dL — ABNORMAL LOW (ref >39.00)
LDL Cholesterol: 63 mg/dL (ref 0–99)
NonHDL: 89.56
TRIGLYCERIDES: 131 mg/dL (ref 0.0–149.0)
VLDL: 26.2 mg/dL (ref 0.0–40.0)

## 2014-12-19 LAB — HEMOGLOBIN A1C: Hgb A1c MFr Bld: 7.2 % — ABNORMAL HIGH (ref 4.6–6.5)

## 2014-12-19 LAB — TSH: TSH: 1.53 u[IU]/mL (ref 0.35–4.50)

## 2014-12-19 LAB — CBC
HCT: 42.3 % (ref 39.0–52.0)
HEMOGLOBIN: 14.4 g/dL (ref 13.0–17.0)
MCHC: 34.1 g/dL (ref 30.0–36.0)
MCV: 91.5 fl (ref 78.0–100.0)
Platelets: 158 10*3/uL (ref 150.0–400.0)
RBC: 4.63 Mil/uL (ref 4.22–5.81)
RDW: 13.6 % (ref 11.5–15.5)
WBC: 5.2 10*3/uL (ref 4.0–10.5)

## 2014-12-19 LAB — VITAMIN D 25 HYDROXY (VIT D DEFICIENCY, FRACTURES): VITD: 22.93 ng/mL — ABNORMAL LOW (ref 30.00–100.00)

## 2014-12-26 ENCOUNTER — Ambulatory Visit (INDEPENDENT_AMBULATORY_CARE_PROVIDER_SITE_OTHER): Payer: Medicare Other | Admitting: Family Medicine

## 2014-12-26 ENCOUNTER — Encounter: Payer: Self-pay | Admitting: Family Medicine

## 2014-12-26 VITALS — BP 120/68 | HR 74 | Temp 97.8°F | Ht 70.0 in | Wt 254.4 lb

## 2014-12-26 DIAGNOSIS — E119 Type 2 diabetes mellitus without complications: Secondary | ICD-10-CM

## 2014-12-26 DIAGNOSIS — E669 Obesity, unspecified: Secondary | ICD-10-CM

## 2014-12-26 DIAGNOSIS — I2510 Atherosclerotic heart disease of native coronary artery without angina pectoris: Secondary | ICD-10-CM | POA: Diagnosis not present

## 2014-12-26 DIAGNOSIS — E559 Vitamin D deficiency, unspecified: Secondary | ICD-10-CM

## 2014-12-26 DIAGNOSIS — Z23 Encounter for immunization: Secondary | ICD-10-CM

## 2014-12-26 DIAGNOSIS — I10 Essential (primary) hypertension: Secondary | ICD-10-CM

## 2014-12-26 DIAGNOSIS — E785 Hyperlipidemia, unspecified: Secondary | ICD-10-CM

## 2014-12-26 DIAGNOSIS — E1169 Type 2 diabetes mellitus with other specified complication: Secondary | ICD-10-CM

## 2014-12-26 DIAGNOSIS — D696 Thrombocytopenia, unspecified: Secondary | ICD-10-CM

## 2014-12-26 NOTE — Patient Instructions (Signed)
Soak feet every night in distilled white vinegar and 1/2 hot water for roughly 15 minutes, dry feet well, apply Lamisil nighty IN AM apply either vick's Vapor Rub or Lavendar Oil ([place] [email or web address])   Athlete's Foot Athlete's foot (tinea pedis) is a fungal infection of the skin on the feet. It often occurs on the skin between the toes or underneath the toes. It can also occur on the soles of the feet. Athlete's foot is more likely to occur in hot, humid weather. Not washing your feet or changing your socks often enough can contribute to athlete's foot. The infection can spread from person to person (contagious). CAUSES Athlete's foot is caused by a fungus. This fungus thrives in warm, moist places. Most people get athlete's foot by sharing shower stalls, towels, and wet floors with an infected person. People with weakened immune systems, including those with diabetes, may be more likely to get athlete's foot. SYMPTOMS   Itchy areas between the toes or on the soles of the feet.  White, flaky, or scaly areas between the toes or on the soles of the feet.  Tiny, intensely itchy blisters between the toes or on the soles of the feet.  Tiny cuts on the skin. These cuts can develop a bacterial infection.  Thick or discolored toenails. DIAGNOSIS  Your caregiver can usually tell what the problem is by doing a physical exam. Your caregiver may also take a skin sample from the rash area. The skin sample may be examined under a microscope, or it may be tested to see if fungus will grow in the sample. A sample may also be taken from your toenail for testing. TREATMENT  Over-the-counter and prescription medicines can be used to kill the fungus. These medicines are available as powders or creams. Your caregiver can suggest medicines for you. Fungal infections respond slowly to treatment. You may need to continue using your medicine for several weeks. PREVENTION   Do not share towels.  Wear  sandals in wet areas, such as shared locker rooms and shared showers.  Keep your feet dry. Wear shoes that allow air to circulate. Wear cotton or wool socks. HOME CARE INSTRUCTIONS   Take medicines as directed by your caregiver. Do not use steroid creams on athlete's foot.  Keep your feet clean and cool. Wash your feet daily and dry them thoroughly, especially between your toes.  Change your socks every day. Wear cotton or wool socks. In hot climates, you may need to change your socks 2 to 3 times per day.  Wear sandals or canvas tennis shoes with good air circulation.  If you have blisters, soak your feet in Burow's solution or Epsom salts for 20 to 30 minutes, 2 times a day to dry out the blisters. Make sure you dry your feet thoroughly afterward. SEEK MEDICAL CARE IF:   You have a fever.  You have swelling, soreness, warmth, or redness in your foot.  You are not getting better after 7 days of treatment.  You are not completely cured after 30 days.  You have any problems caused by your medicines. MAKE SURE YOU:   Understand these instructions.  Will watch your condition.  Will get help right away if you are not doing well or get worse.   This information is not intended to replace advice given to you by your health care provider. Make sure you discuss any questions you have with your health care provider.   Document Released: 02/27/2000 Document Revised:  05/24/2011 Document Reviewed: 09/02/2014 Elsevier Interactive Patient Education [employer].

## 2014-12-26 NOTE — Progress Notes (Signed)
Pre visit review using our clinic review tool, if applicable. No additional management support is needed unless otherwise documented below in the visit note. 

## 2014-12-27 ENCOUNTER — Encounter: Payer: Self-pay | Admitting: Family Medicine

## 2015-01-02 ENCOUNTER — Encounter: Payer: Self-pay | Admitting: Internal Medicine

## 2015-01-02 ENCOUNTER — Ambulatory Visit (INDEPENDENT_AMBULATORY_CARE_PROVIDER_SITE_OTHER): Payer: Medicare Other | Admitting: Internal Medicine

## 2015-01-02 VITALS — BP 122/62 | HR 59 | Temp 97.9°F | Resp 12 | Wt 253.0 lb

## 2015-01-02 DIAGNOSIS — E1165 Type 2 diabetes mellitus with hyperglycemia: Secondary | ICD-10-CM | POA: Diagnosis not present

## 2015-01-02 DIAGNOSIS — E1142 Type 2 diabetes mellitus with diabetic polyneuropathy: Secondary | ICD-10-CM | POA: Diagnosis not present

## 2015-01-02 DIAGNOSIS — I2510 Atherosclerotic heart disease of native coronary artery without angina pectoris: Secondary | ICD-10-CM

## 2015-01-02 MED ORDER — INSULIN NPH (HUMAN) (ISOPHANE) 100 UNIT/ML ~~LOC~~ SUSP: SUBCUTANEOUS | Status: DC

## 2015-01-02 NOTE — Progress Notes (Signed)
Patient ID: Michael Cunningham, male   DOB: 26-Nov-1945, 69 y.o.   MRN: [id number]  HPI: Michael Cunningham is a 69 y.o.-year-old male, returning for f/u DM2, dx in 1999, insulin-dependent since ~2008, uncontrolled, with complications (CAD, peripheral neuropathy, DR). Last visit 3 mo ago.  Last hemoglobin A1c was: Lab Results  Component Value Date   HGBA1C 7.2* 12/19/2014   HGBA1C 6.9* 07/24/2014   HGBA1C 7.9* 03/21/2014   Pt is on a regimen of:  Insulin Before breakfast Before lunch Before dinner Bedtime  Regular 20-25-30 + SSI 20-25-30 + SSI 20-25-30 + SSI   NPH 20   27   Please use: - 20 units of R insulin before a smaller meal - 25 units of R insulin before a regular meal - 30 units of R insulin before a larger meal  Please add the following Sliding scale of R insulin (SSI): - 150-175: + 1 unit  - 176-200: + 2 units  - 201-225: + 3 units  - 226-250: + 4 units  - 251-275: + 5 units - 276-300: + 6 units  Please do not use more than 20 units of R insulin before a meal prior to exercise (rehab, working in the yard, etc.) He cannot afford analog insulins. He tried Metformin R and XR >> diarrhea He tried Januvia.  Pt checks his sugars 0-1 a day and they are higher in am, despite increasing NPH: - am: 90-170 >> 131-190 >> 143-205, 222 - 2h after b'fast: n/c >> 177 >> n.c - before lunch: 70-110 >> 87-168, 250 >> 84, 88 - 2h after lunch: n/c >> 76 >> 70 - before dinner: 100-150 >> 73, 89-129, 207 >> 70 - 2h after dinner: n/c - bedtime: 100-160 >> 137 >> n/c - nighttime: lows >> wake him up >> corrects them (cookie) >> 90 >> n/c + fewer lows - Lowest sugar was 70; he has hypoglycemia awareness at 90.  Highest sugar was 270 >> 250 >> 222  Glucometer: AccuChek  Pt's meals are: - Breakfast: cheerios + skim milk + banana - Lunch: sandwich or leftover  - Dinner: meat + veggies + starch - Snacks: PB crackers; wheat thins, apple or grapes, pear   - no CKD, last BUN/creatinine:  Lab  Results  Component Value Date   BUN 20 12/19/2014   CREATININE 0.84 12/19/2014   on losartan - last set of lipids: Lab Results  Component Value Date   CHOL 126 12/19/2014   HDL 36.10* 12/19/2014   LDLCALC 63 12/19/2014   LDLDIRECT 108.0 04/16/2014   TRIG 131.0 12/19/2014   CHOLHDL 3 12/19/2014   on simvastatin - last eye exam was in 11/26/2014. + DR (mild). + angle-closure glaucoma >> had surgery in both eyes recently >> still blurry vision R eye.  - no numbness and tingling in his L foot. He had a foot exam 12/2014 (dorsum L hallux + fungal inf) - [facility] (Dr Amalia Hailey). Very mild PN in R foot.   He also has a history of hypertension and hyperlipidemia.  ROS: Constitutional: no weight gain/loss, no fatigue, no subjective hyperthermia/hypothermia, + poor sleep Eyes: + blurry vision, no xerophthalmia ENT: no sore throat, no nodules palpated in throat, no dysphagia/odynophagia, no hoarseness, + tinnitus Cardiovascular: no CP/SOB/palpitations/leg swelling Respiratory: no cough/SOB Gastrointestinal: no N/V/D/C Musculoskeletal: no muscle/joint aches Skin: no rashes Neurological: no tremors/numbness/tingling/dizziness  I reviewed pt's medications, allergies, PMH, social hx, family hx, and changes were documented in the history of present illness.  Otherwise, unchanged from my initial visit note:  Past Medical History  Diagnosis Date  . Hypertension   . Multiple lipomas     hx. mulitple and some remains -arms, legs  . Diverticulosis of colon     LEFT SIDE  . History of small bowel obstruction     X2  28 YRS AGO AND LAST ONE 04/2014--  RESOLVED WITHOUT SURGICAL INTERVENTION  . Type 2 diabetes mellitus ([place])   . Left ureteral calculus   . Complication of anesthesia     11'12 Surgery with excrutiating "head about to explode" upon awakening  -lasted several hours postop  ( SURGERY DONE 08-13-2011 AT [facility] PT DID OK POST OP)  . Measles =  . Hyperlipidemia   . Recurrent  kidney stones 07/08/2013    Follows with Dr Gaynelle Arabian He believes they are Calcium oxalate stones  . Obesity, unspecified 09/18/2013  . CAD (coronary artery disease)     a. 05/2014 [place] s/p DES x4 to mid-distLAD and DESx1 to DI  . Vitamin D deficiency 07/02/2014  . Thrombocytopenia ([place]) 07/07/2014   Past Surgical History  Procedure Laterality Date  . Lipoma excision  1975    multiple, 10-12  . Knee arthroscopy  08/13/2011    right ARTHROSCOPY KNEE;  Surgeon: Gearlean Alf, MD;  Location: [facility];  Service: Orthopedics;  Laterality: Right;  medial  meniscal debridement, excision of plica, synovectomy  . Knee arthroscopy w/ meniscectomy Right 01-29-2011    twice, first trimmed mediscus and cyst  . Colonoscopy w/ polypectomy  LAST ONE 2013  . Knee arthroscopy Left 11/16/2013    Procedure: LEFT KNEE ARTHROSCOPY MEDIAL MENISCAL DEBRIDEMENT, CHONDROPLASTY;  Surgeon: Gearlean Alf, MD;  Location: [facility];  Service: Orthopedics;  Laterality: Left;  . Coronary stent placement  05/17/2014    PROXIMAL X 4  MID & DISTAL LAD  DIAGONAL   . Left heart catheterization with coronary angiogram N/A 05/17/2014    Procedure: LEFT HEART CATHETERIZATION WITH CORONARY ANGIOGRAM;  Surgeon: Burnell Blanks, MD;  Location: [facility] CATH LAB;  Service: Cardiovascular;  Laterality: N/A;  . Percutaneous coronary stent intervention (pci-s)  05/17/2014    Procedure: PERCUTANEOUS CORONARY STENT INTERVENTION (PCI-S);  Surgeon: Burnell Blanks, MD;  Location: [facility] CATH LAB;  Service: Cardiovascular;;  Diag1 and Prox to Distal LAD   History   Social History  . Marital Status: Married    Spouse Name: N/A  . Number of Children: 2   Social History Main Topics  . Smoking status: Former Smoker -- 1.00 packs/day for 4 years    Types: Cigarettes    Quit date: 06/14/1967  . Smokeless tobacco: Never Used  . Alcohol Use: No  . Drug Use: No  . 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     Current Outpatient Prescriptions on File Prior to Visit  Medication Sig Dispense Refill  . ACCU-CHEK FASTCLIX LANCETS MISC Use to test blood sugar 2 times daily. Dx: E11.40 102 each 11  . acetaminophen (TYLENOL) 500 MG tablet Take 1,000 mg by mouth every 6 (six) hours as needed for mild pain.    [name] aspirin EC 81 MG tablet Take 81 mg by mouth daily with breakfast.     . cholecalciferol (VITAMIN D) 1000 UNITS tablet Take 3,000 Units by mouth daily.     [name] glucose blood (ACCU-CHEK AVIVA PLUS) test strip Use  to test blood sugar 2 times daily. Dx: E11.40 100 each 11  . HUMULIN N 100 UNIT/ML injection INJECT UNDER SKIN 20 UNITS TWICE A DAY 20 mL 6  . insulin regular (HUMULIN R) 100 units/mL injection Inject under skin 20-35 units 3 times a day (just before each meal), as advised. 30 mL 2  . losartan (COZAAR) 50 MG tablet Take 1.5 tablets (75 mg total) by mouth daily. 1 1/2 tablets every morning 180 tablet 0  . metoprolol tartrate (LOPRESSOR) 25 MG tablet Take 1 tablet (25 mg total) by mouth 2 (two) times daily. 60 tablet 11  . Multiple Vitamin (MULTIVITAMIN WITH MINERALS) TABS tablet Take 1 tablet by mouth daily.    . nitroGLYCERIN (NITROSTAT) 0.4 MG SL tablet Place 1 tablet (0.4 mg total) under the tongue every 5 (five) minutes as needed for chest pain. 90 tablet 3  . Polyethylene Glycol 3350 (MIRALAX PO) Take by mouth.    . prasugrel (EFFIENT) 10 MG TABS tablet Take 1 tablet (10 mg total) by mouth daily. 30 tablet 11  . simvastatin (ZOCOR) 40 MG tablet TAKE 1 TABLET (40 MG) BY MOUTH AT BEDTIME. 90 tablet 1  . TRUEPLUS INSULIN SYRINGE 30G X 5/16" 0.5 ML MISC     . Vitamin D, Ergocalciferol, (DRISDOL) 50000 UNITS CAPS capsule Take 1 capsule (50,000 Units total) by mouth every 7 (seven) days. For 12 weeks 4 capsule 3   No current facility-administered medications on file prior to visit.   Allergies  Allergen Reactions  . Ciprofloxacin Other (See  Comments)    Abdominal pain  . Lipitor [Atorvastatin Calcium] Other (See Comments)    Severe muscle pain  . Lisinopril Cough  . Metformin And Related Diarrhea   Family History  Problem Relation Age of Onset  . Hypertension Mother   . Cancer Mother 65    ovarian  . Heart disease Father     CHF  . Heart attack Father 70  . Cancer Sister     breast- just finished her last radiation  . Heart disease Maternal Grandfather     ?  [name] Heart attack Paternal Grandfather 25  . Diabetes Neg Hx   . Heart attack Paternal Uncle 59    Sudden death   PE: BP 122/62 mmHg  Pulse 59  Temp(Src) 97.9 F (36.6 C) (Oral)  Resp 12  Wt 253 lb (114.76 kg)  SpO2 97% Body mass index is 36.3 kg/(m^2).  Wt Readings from Last 3 Encounters:  01/02/15 253 lb (114.76 kg)  12/26/14 254 lb 6 oz (115.384 kg)  10/03/14 250 lb 6.4 oz (113.581 kg)   Constitutional: Obese, in NAD Eyes: PERRLA, EOMI, no exophthalmos ENT: moist mucous membranes, no thyromegaly, no cervical lymphadenopathy Cardiovascular: RRR, No MRG Respiratory: CTA B Gastrointestinal: abdomen soft, NT, ND, BS+ Musculoskeletal: no deformities, strength intact in all 4 Skin: moist, warm, + rash: Vitiligo on arms Neurological: no tremor with outstretched hands, DTR normal in all 4  ASSESSMENT: 1. DM2, insulin-dependent, uncontrolled, with complications - CAD, status post stent 05/2014 - Dr Percival Spanish - Peripheral neuropathy - DR  PLAN:  1. Patient with long-standing, uncontrolled diabetes (he has a good HbA1c - 7.2% at last check, but high CBGs in am and low CBGs later in the day), usually a sign of not enough basal insulin and too much bolus insulin >> will decrease his insulin boluses further and increase NPH at bedtime: Patient Instructions   Please change the insulin doses: Insulin Before breakfast Before  lunch Before dinner Bedtime  Regular 20-25 + SSI 20-25 + SSI 20-25 + SSI   NPH 20   27 >> 36   Please use: - 20 units of R  insulin before a smaller meal - 25 units of R insulin before a larger meal  Please add the following Sliding scale of R insulin (SSI): - 150-175: + 1 unit  - 176-200: + 2 units  - 201-225: + 3 units  - 226-250: + 4 units  - 251-275: + 5 units - 276-300: + 6 units  - I advised him to continue to check  at least 3 times a day, rotating checks - advised for yearly eye exams >> He is UTD - had the flu shot this season - Return to clinic in 3 mo with sugar log >> will check hbA1c then

## 2015-01-02 NOTE — Patient Instructions (Addendum)
Please change the insulin doses: Insulin Before breakfast Before lunch Before dinner Bedtime  Regular 20-25 + SSI 20-25 + SSI 20-25 + SSI   NPH 20   27 >> 36   Please use: - 20 units of R insulin before a smaller meal - 25 units of R insulin before a larger meal  Please add the following Sliding scale of R insulin (SSI): - 150-175: + 1 unit  - 176-200: + 2 units  - 201-225: + 3 units  - 226-250: + 4 units  - 251-275: + 5 units - 276-300: + 6 units

## 2015-01-05 ENCOUNTER — Encounter: Payer: Self-pay | Admitting: Family Medicine

## 2015-01-05 NOTE — Progress Notes (Signed)
Subjective:    Patient ID: Michael Cunningham, male    DOB: 10/06/1945, 69 y.o.   MRN: [id number]  Chief Complaint  Patient presents with  . Follow-up    3 month    HPI Patient is in today for follow-up. He is feeling well. He has established now with endocrinology and his sugars are improving. He denies polyuria and polydipsia. He continues to follow with ophthalmology Dr. Domenick Bookbinder in. No recent illness or injury. No polyuria or polydipsia. Denies CP/palp/SOB/HA/congestion/fevers/GI or GU c/o. Taking meds as prescribed  Past Medical History  Diagnosis Date  . Hypertension   . Multiple lipomas     hx. mulitple and some remains -arms, legs  . Diverticulosis of colon     LEFT SIDE  . History of small bowel obstruction     X2  28 YRS AGO AND LAST ONE 04/2014--  RESOLVED WITHOUT SURGICAL INTERVENTION  . Type 2 diabetes mellitus ([place])   . Left ureteral calculus   . Complication of anesthesia     11'12 Surgery with excrutiating "head about to explode" upon awakening  -lasted several hours postop  ( SURGERY DONE 08-13-2011 AT [facility] PT DID OK POST OP)  . Measles =  . Hyperlipidemia   . Recurrent kidney stones 07/08/2013    Follows with Dr Gaynelle Arabian He believes they are Calcium oxalate stones  . Obesity, unspecified 09/18/2013  . CAD (coronary artery disease)     a. 05/2014 [place] s/p DES x4 to mid-distLAD and DESx1 to DI  . Vitamin D deficiency 07/02/2014  . Thrombocytopenia ([place]) 07/07/2014    Past Surgical History  Procedure Laterality Date  . Lipoma excision  1975    multiple, 10-12  . Knee arthroscopy  08/13/2011    right ARTHROSCOPY KNEE;  Surgeon: Gearlean Alf, MD;  Location: [facility];  Service: Orthopedics;  Laterality: Right;  medial  meniscal debridement, excision of plica, synovectomy  . Knee arthroscopy w/ meniscectomy Right 01-29-2011    twice, first trimmed mediscus and cyst  . Colonoscopy w/ polypectomy  LAST ONE 2013  . Knee arthroscopy Left 11/16/2013    Procedure: LEFT KNEE  ARTHROSCOPY MEDIAL MENISCAL DEBRIDEMENT, CHONDROPLASTY;  Surgeon: Gearlean Alf, MD;  Location: [facility];  Service: Orthopedics;  Laterality: Left;  . Coronary stent placement  05/17/2014    PROXIMAL X 4  MID & DISTAL LAD  DIAGONAL   . Left heart catheterization with coronary angiogram N/A 05/17/2014    Procedure: LEFT HEART CATHETERIZATION WITH CORONARY ANGIOGRAM;  Surgeon: Burnell Blanks, MD;  Location: [facility] CATH LAB;  Service: Cardiovascular;  Laterality: N/A;  . Percutaneous coronary stent intervention (pci-s)  05/17/2014    Procedure: PERCUTANEOUS CORONARY STENT INTERVENTION (PCI-S);  Surgeon: Burnell Blanks, MD;  Location: [facility] CATH LAB;  Service: Cardiovascular;;  Diag1 and Prox to Distal LAD    Family History  Problem Relation Age of Onset  . Hypertension Mother   . Cancer Mother 28    ovarian  . Heart disease Father     CHF  . Heart attack Father 26  . Cancer Sister     breast- just finished her last radiation  . Heart disease Maternal Grandfather     ?  [name] Heart attack Paternal Grandfather 19  . Diabetes Neg Hx   . Heart attack Paternal Uncle 39    Sudden death    Social History   Social History  . Marital Status: Married    Spouse Name: N/A  .  Number of Children: 2  . Years of Education: N/A   Occupational History  . Not on file.   Social History Main Topics  . Smoking status: Former Smoker -- 1.00 packs/day for 4 years    Types: Cigarettes    Quit date: 06/14/1967  . Smokeless tobacco: Never Used  . Alcohol Use: No  . Drug Use: No  . Sexual Activity: Yes     Comment: lives with wife   Other Topics Concern  . 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      Outpatient Prescriptions Prior to Visit  Medication Sig Dispense Refill  . ACCU-CHEK FASTCLIX LANCETS MISC Use to test blood sugar 2 times daily. Dx: E11.40 102 each 11  . acetaminophen (TYLENOL) 500 MG tablet Take 1,000 mg by mouth every 6  (six) hours as needed for mild pain.    [name] aspirin EC 81 MG tablet Take 81 mg by mouth daily with breakfast.     . cholecalciferol (VITAMIN D) 1000 UNITS tablet Take 3,000 Units by mouth daily.     [name] glucose blood (ACCU-CHEK AVIVA PLUS) test strip Use to test blood sugar 2 times daily. Dx: E11.40 100 each 11  . insulin regular (HUMULIN R) 100 units/mL injection Inject under skin 20-35 units 3 times a day (just before each meal), as advised. 30 mL 2  . losartan (COZAAR) 50 MG tablet Take 1.5 tablets (75 mg total) by mouth daily. 1 1/2 tablets every morning 180 tablet 0  . metoprolol tartrate (LOPRESSOR) 25 MG tablet Take 1 tablet (25 mg total) by mouth 2 (two) times daily. 60 tablet 11  . Multiple Vitamin (MULTIVITAMIN WITH MINERALS) TABS tablet Take 1 tablet by mouth daily.    . nitroGLYCERIN (NITROSTAT) 0.4 MG SL tablet Place 1 tablet (0.4 mg total) under the tongue every 5 (five) minutes as needed for chest pain. 90 tablet 3  . Polyethylene Glycol 3350 (MIRALAX PO) Take by mouth.    . prasugrel (EFFIENT) 10 MG TABS tablet Take 1 tablet (10 mg total) by mouth daily. 30 tablet 11  . simvastatin (ZOCOR) 40 MG tablet TAKE 1 TABLET (40 MG) BY MOUTH AT BEDTIME. 90 tablet 1  . TRUEPLUS INSULIN SYRINGE 30G X 5/16" 0.5 ML MISC     . Vitamin D, Ergocalciferol, (DRISDOL) 50000 UNITS CAPS capsule Take 1 capsule (50,000 Units total) by mouth every 7 (seven) days. For 12 weeks 4 capsule 3  . HUMULIN N 100 UNIT/ML injection INJECT UNDER SKIN 20 UNITS TWICE A DAY 20 mL 6  . prednisoLONE acetate (PRED FORTE) 1 % ophthalmic suspension      No facility-administered medications prior to visit.    Allergies  Allergen Reactions  . Ciprofloxacin Other (See Comments)    Abdominal pain  . Lipitor [Atorvastatin Calcium] Other (See Comments)    Severe muscle pain  . Lisinopril Cough  . Metformin And Related Diarrhea    Review of Systems  Constitutional: Negative for fever and malaise/fatigue.  HENT: Negative  for congestion.   Eyes: Negative for discharge.  Respiratory: Negative for shortness of breath.   Cardiovascular: Negative for chest pain, palpitations and leg swelling.  Gastrointestinal: Negative for nausea and abdominal pain.  Genitourinary: Negative for dysuria.  Musculoskeletal: Negative for falls.  Skin: Negative for rash.  Neurological: Negative for loss of consciousness and headaches.  Endo/Heme/Allergies: Negative for environmental allergies.  Psychiatric/Behavioral: Negative for depression. The patient is not  nervous/anxious.        Objective:    Physical Exam  Constitutional: He is oriented to person, place, and time. He appears well-developed and well-nourished. No distress.  HENT:  Head: Normocephalic and atraumatic.  Nose: Nose normal.  Eyes: Right eye exhibits no discharge. Left eye exhibits no discharge.  Neck: Normal range of motion. Neck supple.  Cardiovascular: Normal rate and regular rhythm.   No murmur heard. Pulmonary/Chest: Effort normal and breath sounds normal.  Abdominal: Soft. Bowel sounds are normal. There is no tenderness.  Musculoskeletal: He exhibits no edema.  Neurological: He is alert and oriented to person, place, and time.  Skin: Skin is warm and dry.  Psychiatric: He has a normal mood and affect.  Nursing note and vitals reviewed.   BP 120/68 mmHg  Pulse 74  Temp(Src) 97.8 F (36.6 C) (Oral)  Ht 5\' 10"  (1.778 m)  Wt 254 lb 6 oz (115.384 kg)  BMI 36.50 kg/m2  SpO2 94% Wt Readings from Last 3 Encounters:  01/02/15 253 lb (114.76 kg)  12/26/14 254 lb 6 oz (115.384 kg)  10/03/14 250 lb 6.4 oz (113.581 kg)     Lab Results  Component Value Date   WBC 5.2 12/19/2014   HGB 14.4 12/19/2014   HCT 42.3 12/19/2014   PLT 158.0 12/19/2014   GLUCOSE 198* 12/19/2014   CHOL 126 12/19/2014   TRIG 131.0 12/19/2014   HDL 36.10* 12/19/2014   LDLDIRECT 108.0 04/16/2014   LDLCALC 63 12/19/2014   ALT 24 12/19/2014   AST 21 12/19/2014   NA 140  12/19/2014   K 4.4 12/19/2014   CL 105 12/19/2014   CREATININE 0.84 12/19/2014   BUN 20 12/19/2014   CO2 27 12/19/2014   TSH 1.53 12/19/2014   PSA 1.24 07/24/2014   INR 1.02 05/17/2014   HGBA1C 7.2* 12/19/2014   MICROALBUR 0.8 03/21/2014    Lab Results  Component Value Date   TSH 1.53 12/19/2014   Lab Results  Component Value Date   WBC 5.2 12/19/2014   HGB 14.4 12/19/2014   HCT 42.3 12/19/2014   MCV 91.5 12/19/2014   PLT 158.0 12/19/2014   Lab Results  Component Value Date   NA 140 12/19/2014   K 4.4 12/19/2014   CO2 27 12/19/2014   GLUCOSE 198* 12/19/2014   BUN 20 12/19/2014   CREATININE 0.84 12/19/2014   BILITOT 0.7 12/19/2014   ALKPHOS 79 12/19/2014   AST 21 12/19/2014   ALT 24 12/19/2014   PROT 6.8 12/19/2014   ALBUMIN 3.9 12/19/2014   CALCIUM 8.8 12/19/2014   ANIONGAP 8 05/18/2014   GFR 95.88 12/19/2014   Lab Results  Component Value Date   CHOL 126 12/19/2014   Lab Results  Component Value Date   HDL 36.10* 12/19/2014   Lab Results  Component Value Date   LDLCALC 63 12/19/2014   Lab Results  Component Value Date   TRIG 131.0 12/19/2014   Lab Results  Component Value Date   CHOLHDL 3 12/19/2014   Lab Results  Component Value Date   HGBA1C 7.2* 12/19/2014       Assessment & Plan:   Problem List Items Addressed This Visit    Vitamin D deficiency    Vitamin 50000 weekly and 2000 IU daily      Relevant Orders   TSH   CBC   Hepatitis C antibody   Lipid panel   Hemoglobin A1c   Comprehensive metabolic panel   Vitamin D (25 hydroxy)  Thrombocytopenia (HCC)   Relevant Orders   TSH   CBC   Hepatitis C antibody   Lipid panel   Hemoglobin A1c   Comprehensive metabolic panel   Vitamin D (25 hydroxy)   Obesity    Encouraged DASH diet, decrease po intake and increase exercise as tolerated. Needs 7-8 hours of sleep nightly. Avoid trans fats, eat small, frequent meals every 4-5 hours with lean proteins, complex carbs and healthy  fats. Minimize simple carbs, GMO foods.      Hypertension (Chronic)    Well controlled, no changes to meds. Encouraged heart healthy diet such as the DASH diet and exercise as tolerated.       Relevant Orders   TSH   CBC   Hepatitis C antibody   Lipid panel   Hemoglobin A1c   Comprehensive metabolic panel   Vitamin D (25 hydroxy)   Hyperlipidemia    Tolerating statin, encouraged heart healthy diet, avoid trans fats, minimize simple carbs and saturated fats. Increase exercise as tolerated      Relevant Orders   TSH   CBC   Hepatitis C antibody   Lipid panel   Hemoglobin A1c   Comprehensive metabolic panel   Vitamin D (25 hydroxy)   Diabetes mellitus type 2 in obese (HCC)    hgba1c acceptable, minimize simple carbs. Increase exercise as tolerated. Continue current meds. Following with endocrinology, improving      Relevant Orders   TSH   CBC   Hepatitis C antibody   Lipid panel   Hemoglobin A1c   Comprehensive metabolic panel   Vitamin D (25 hydroxy)    Other Visit Diagnoses    Encounter for immunization    -  Primary       I have discontinued Mr. Weigand's prednisoLONE acetate. I am also having him maintain his aspirin EC, multivitamin with minerals, acetaminophen, nitroGLYCERIN, metoprolol tartrate, prasugrel, insulin regular, cholecalciferol, TRUEPLUS INSULIN SYRINGE, glucose blood, ACCU-CHEK FASTCLIX LANCETS, losartan, Vitamin D (Ergocalciferol), Polyethylene Glycol 3350 (MIRALAX PO), and simvastatin.  No orders of the defined types were placed in this encounter.     Penni Homans, MD

## 2015-01-05 NOTE — Assessment & Plan Note (Signed)
Tolerating statin, encouraged heart healthy diet, avoid trans fats, minimize simple carbs and saturated fats. Increase exercise as tolerated 

## 2015-01-05 NOTE — Assessment & Plan Note (Signed)
Vitamin 50000 weekly and 2000 IU daily

## 2015-01-05 NOTE — Assessment & Plan Note (Signed)
hgba1c acceptable, minimize simple carbs. Increase exercise as tolerated. Continue current meds. Following with endocrinology, improving

## 2015-01-05 NOTE — Assessment & Plan Note (Signed)
Well controlled, no changes to meds. Encouraged heart healthy diet such as the DASH diet and exercise as tolerated.  °

## 2015-01-05 NOTE — Assessment & Plan Note (Signed)
Encouraged DASH diet, decrease po intake and increase exercise as tolerated. Needs 7-8 hours of sleep nightly. Avoid trans fats, eat small, frequent meals every 4-5 hours with lean proteins, complex carbs and healthy fats. Minimize simple carbs, GMO foods. 

## 2015-01-07 ENCOUNTER — Other Ambulatory Visit: Payer: Self-pay | Admitting: *Deleted

## 2015-01-07 ENCOUNTER — Encounter: Payer: Self-pay | Admitting: Internal Medicine

## 2015-01-07 MED ORDER — INSULIN NPH (HUMAN) (ISOPHANE) 100 UNIT/ML ~~LOC~~ SUSP: SUBCUTANEOUS | Status: DC

## 2015-01-07 MED ORDER — GLUCOSE BLOOD VI STRP: ORAL_STRIP | Status: DC

## 2015-01-07 MED ORDER — ACCU-CHEK FASTCLIX LANCETS MISC: Status: DC

## 2015-01-07 MED ORDER — INSULIN REGULAR HUMAN 100 UNIT/ML IJ SOLN: INTRAMUSCULAR | Status: DC

## 2015-02-03 ENCOUNTER — Other Ambulatory Visit: Payer: Self-pay | Admitting: Family Medicine

## 2015-02-18 ENCOUNTER — Ambulatory Visit (INDEPENDENT_AMBULATORY_CARE_PROVIDER_SITE_OTHER): Payer: Medicare Other | Admitting: Podiatry

## 2015-02-18 ENCOUNTER — Encounter: Payer: Self-pay | Admitting: Podiatry

## 2015-02-18 DIAGNOSIS — M79676 Pain in unspecified toe(s): Secondary | ICD-10-CM

## 2015-02-18 DIAGNOSIS — B351 Tinea unguium: Secondary | ICD-10-CM

## 2015-02-18 NOTE — Progress Notes (Signed)
Patient ID: Michael Cunningham, male   DOB: Jul 15, 1945, 69 y.o.   MRN: [id number]  Subjective: This patient presents again today complaining of painful toenails and walking wearing shoes request nail debridement. He also states that he said a red discolored area on the dorsal aspect of his right and left toes that have not responded to 2 months of Lamisil cream  Objective: No open skin lesions bilaterally There is a flat macular red appearing lesions and dorsal aspect the lesser toes and right and left feet. No surrounding erythema, edema around these areas The toenails are elongated, brittle, discolored, hypertrophic and tender to direct palpation  Assessment: Symptomatic onychomycoses 6-10 Skin lesions undetermined origin probably not related totinea.  Plan: Debridement toenails 10 mechanically and electronically without any bleeding Patient advised to see dermatologist to have skin lesions evaluated  Reappoint 3 months

## 2015-02-18 NOTE — Patient Instructions (Addendum)
Consult with dermatologist to have an evaluation for the red splotchy areas on your toes in the right and left feet that have not responded to Lamisil cream Diabetes and Foot Care Diabetes may cause you to have problems because of poor blood supply (circulation) to your feet and legs. This may cause the skin on your feet to become thinner, break easier, and heal more slowly. Your skin may become dry, and the skin may peel and crack. You may also have nerve damage in your legs and feet causing decreased feeling in them. You may not notice minor injuries to your feet that could lead to infections or more serious problems. Taking care of your feet is one of the most important things you can do for yourself.  HOME CARE INSTRUCTIONS  Wear shoes at all times, even in the house. Do not go barefoot. Bare feet are easily injured.  Check your feet daily for blisters, cuts, and redness. If you cannot see the bottom of your feet, use a mirror or ask someone for help.  Wash your feet with warm water (do not use hot water) and mild soap. Then pat your feet and the areas between your toes until they are completely dry. Do not soak your feet as this can dry your skin.  Apply a moisturizing lotion or petroleum jelly (that does not contain alcohol and is unscented) to the skin on your feet and to dry, brittle toenails. Do not apply lotion between your toes.  Trim your toenails straight across. Do not dig under them or around the cuticle. File the edges of your nails with an emery board or nail file.  Do not cut corns or calluses or try to remove them with medicine.  Wear clean socks or stockings every day. Make sure they are not too tight. Do not wear knee-high stockings since they may decrease blood flow to your legs.  Wear shoes that fit properly and have enough cushioning. To break in new shoes, wear them for just a few hours a day. This prevents you from injuring your feet. Always look in your shoes before you  put them on to be sure there are no objects inside.  Do not cross your legs. This may decrease the blood flow to your feet.  If you find a minor scrape, cut, or break in the skin on your feet, keep it and the skin around it clean and dry. These areas may be cleansed with mild soap and water. Do not cleanse the area with peroxide, alcohol, or iodine.  When you remove an adhesive bandage, be sure not to damage the skin around it.  If you have a wound, look at it several times a day to make sure it is healing.  Do not use heating pads or hot water bottles. They may burn your skin. If you have lost feeling in your feet or legs, you may not know it is happening until it is too late.  Make sure your health care provider performs a complete foot exam at least annually or more often if you have foot problems. Report any cuts, sores, or bruises to your health care provider immediately. SEEK MEDICAL CARE IF:   You have an injury that is not healing.  You have cuts or breaks in the skin.  You have an ingrown nail.  You notice redness on your legs or feet.  You feel burning or tingling in your legs or feet.  You have pain or cramps in  your legs and feet.  Your legs or feet are numb.  Your feet always feel cold. SEEK IMMEDIATE MEDICAL CARE IF:   There is increasing redness, swelling, or pain in or around a wound.  There is a red line that goes up your leg.  Pus is coming from a wound.  You develop a fever or as directed by your health care provider.  You notice a bad smell coming from an ulcer or wound.   This information is not intended to replace advice given to you by your health care provider. Make sure you discuss any questions you have with your health care provider.   Document Released: 02/27/2000 Document Revised: 11/01/2012 Document Reviewed: 08/08/2012 Elsevier Interactive Patient Education 2016 [place].   Diabetes and Foot Care Diabetes may cause you to have  problems because of poor blood supply (circulation) to your feet and legs. This may cause the skin on your feet to become thinner, break easier, and heal more slowly. Your skin may become dry, and the skin may peel and crack. You may also have nerve damage in your legs and feet causing decreased feeling in them. You may not notice minor injuries to your feet that could lead to infections or more serious problems. Taking care of your feet is one of the most important things you can do for yourself.  HOME CARE INSTRUCTIONS  Wear shoes at all times, even in the house. Do not go barefoot. Bare feet are easily injured.  Check your feet daily for blisters, cuts, and redness. If you cannot see the bottom of your feet, use a mirror or ask someone for help.  Wash your feet with warm water (do not use hot water) and mild soap. Then pat your feet and the areas between your toes until they are completely dry. Do not soak your feet as this can dry your skin.  Apply a moisturizing lotion or petroleum jelly (that does not contain alcohol and is unscented) to the skin on your feet and to dry, brittle toenails. Do not apply lotion between your toes.  Trim your toenails straight across. Do not dig under them or around the cuticle. File the edges of your nails with an emery board or nail file.  Do not cut corns or calluses or try to remove them with medicine.  Wear clean socks or stockings every day. Make sure they are not too tight. Do not wear knee-high stockings since they may decrease blood flow to your legs.  Wear shoes that fit properly and have enough cushioning. To break in new shoes, wear them for just a few hours a day. This prevents you from injuring your feet. Always look in your shoes before you put them on to be sure there are no objects inside.  Do not cross your legs. This may decrease the blood flow to your feet.  If you find a minor scrape, cut, or break in the skin on your feet, keep it and the  skin around it clean and dry. These areas may be cleansed with mild soap and water. Do not cleanse the area with peroxide, alcohol, or iodine.  When you remove an adhesive bandage, be sure not to damage the skin around it.  If you have a wound, look at it several times a day to make sure it is healing.  Do not use heating pads or hot water bottles. They may burn your skin. If you have lost feeling in your feet or legs,  you may not know it is happening until it is too late.  Make sure your health care provider performs a complete foot exam at least annually or more often if you have foot problems. Report any cuts, sores, or bruises to your health care provider immediately. SEEK MEDICAL CARE IF:   You have an injury that is not healing.  You have cuts or breaks in the skin.  You have an ingrown nail.  You notice redness on your legs or feet.  You feel burning or tingling in your legs or feet.  You have pain or cramps in your legs and feet.  Your legs or feet are numb.  Your feet always feel cold. SEEK IMMEDIATE MEDICAL CARE IF:   There is increasing redness, swelling, or pain in or around a wound.  There is a red line that goes up your leg.  Pus is coming from a wound.  You develop a fever or as directed by your health care provider.  You notice a bad smell coming from an ulcer or wound.   This information is not intended to replace advice given to you by your health care provider. Make sure you discuss any questions you have with your health care provider.   Document Released: 02/27/2000 Document Revised: 11/01/2012 Document Reviewed: 08/08/2012 Elsevier Interactive Patient Education [employer].

## 2015-02-24 ENCOUNTER — Other Ambulatory Visit: Payer: Self-pay | Admitting: Family Medicine

## 2015-02-25 DIAGNOSIS — H35342 Macular cyst, hole, or pseudohole, left eye: Secondary | ICD-10-CM | POA: Diagnosis not present

## 2015-03-24 ENCOUNTER — Other Ambulatory Visit: Payer: Medicare Other

## 2015-03-25 ENCOUNTER — Other Ambulatory Visit: Payer: Self-pay | Admitting: Family Medicine

## 2015-03-25 MED FILL — TRUEPLUS SYR 0.5ML 30GX5/16: 30G X 5/16" | 50 days supply | Qty: 200 | Fill #1

## 2015-03-25 MED FILL — HumuLIN N 100 UNIT/ML SUSP: 100 | 36 days supply | Qty: 20 | Fill #2

## 2015-03-25 MED FILL — VIT D2 1.25 MG (50,000 UNIT: 1.25 MG | 28 days supply | Qty: 4 | Fill #0

## 2015-03-26 ENCOUNTER — Other Ambulatory Visit (INDEPENDENT_AMBULATORY_CARE_PROVIDER_SITE_OTHER): Payer: Medicare Other

## 2015-03-26 DIAGNOSIS — D696 Thrombocytopenia, unspecified: Secondary | ICD-10-CM | POA: Diagnosis not present

## 2015-03-26 DIAGNOSIS — E785 Hyperlipidemia, unspecified: Secondary | ICD-10-CM | POA: Diagnosis not present

## 2015-03-26 DIAGNOSIS — I10 Essential (primary) hypertension: Secondary | ICD-10-CM | POA: Diagnosis not present

## 2015-03-26 DIAGNOSIS — E559 Vitamin D deficiency, unspecified: Secondary | ICD-10-CM | POA: Diagnosis not present

## 2015-03-26 DIAGNOSIS — E119 Type 2 diabetes mellitus without complications: Secondary | ICD-10-CM | POA: Diagnosis not present

## 2015-03-26 DIAGNOSIS — E669 Obesity, unspecified: Secondary | ICD-10-CM

## 2015-03-26 DIAGNOSIS — E1169 Type 2 diabetes mellitus with other specified complication: Secondary | ICD-10-CM

## 2015-03-26 LAB — LIPID PANEL
CHOL/HDL RATIO: 4
Cholesterol: 141 mg/dL (ref 0–200)
HDL: 33.3 mg/dL — AB (ref >39.00)
LDL Cholesterol: 71 mg/dL (ref 0–99)
NONHDL: 107.55
Triglycerides: 183 mg/dL — ABNORMAL HIGH (ref 0.0–149.0)
VLDL: 36.6 mg/dL (ref 0.0–40.0)

## 2015-03-26 LAB — VITAMIN D 25 HYDROXY (VIT D DEFICIENCY, FRACTURES): VITD: 29.93 ng/mL — ABNORMAL LOW (ref 30.00–100.00)

## 2015-03-26 LAB — CBC
HEMATOCRIT: 43.7 % (ref 39.0–52.0)
HEMOGLOBIN: 14.9 g/dL (ref 13.0–17.0)
MCHC: 34 g/dL (ref 30.0–36.0)
MCV: 92.1 fl (ref 78.0–100.0)
Platelets: 167 10*3/uL (ref 150.0–400.0)
RBC: 4.74 Mil/uL (ref 4.22–5.81)
RDW: 13.5 % (ref 11.5–15.5)
WBC: 6.2 10*3/uL (ref 4.0–10.5)

## 2015-03-26 LAB — COMPREHENSIVE METABOLIC PANEL
ALT: 26 U/L (ref 0–53)
AST: 23 U/L (ref 0–37)
Albumin: 4 g/dL (ref 3.5–5.2)
Alkaline Phosphatase: 82 U/L (ref 39–117)
BILIRUBIN TOTAL: 0.6 mg/dL (ref 0.2–1.2)
BUN: 15 mg/dL (ref 6–23)
CALCIUM: 9.3 mg/dL (ref 8.4–10.5)
CHLORIDE: 103 meq/L (ref 96–112)
CO2: 27 meq/L (ref 19–32)
Creatinine, Ser: 0.8 mg/dL (ref 0.40–1.50)
GFR: 101.36 mL/min (ref >60.00)
Glucose, Bld: 172 mg/dL — ABNORMAL HIGH (ref 70–99)
Potassium: 4.3 mEq/L (ref 3.5–5.1)
Sodium: 138 mEq/L (ref 135–145)
Total Protein: 6.7 g/dL (ref 6.0–8.3)

## 2015-03-26 LAB — TSH: TSH: 1.84 u[IU]/mL (ref 0.35–4.50)

## 2015-03-26 LAB — HEMOGLOBIN A1C: Hgb A1c MFr Bld: 7.4 % — ABNORMAL HIGH (ref 4.6–6.5)

## 2015-03-27 LAB — HEPATITIS C ANTIBODY: HCV Ab: NEGATIVE

## 2015-03-31 ENCOUNTER — Encounter: Payer: Self-pay | Admitting: Family Medicine

## 2015-03-31 ENCOUNTER — Ambulatory Visit (INDEPENDENT_AMBULATORY_CARE_PROVIDER_SITE_OTHER): Payer: Medicare Other | Admitting: Family Medicine

## 2015-03-31 ENCOUNTER — Other Ambulatory Visit: Payer: Self-pay

## 2015-03-31 ENCOUNTER — Other Ambulatory Visit: Payer: Self-pay | Admitting: Family Medicine

## 2015-03-31 VITALS — BP 120/62 | HR 69 | Temp 97.6°F | Ht 70.0 in | Wt 257.0 lb

## 2015-03-31 DIAGNOSIS — E119 Type 2 diabetes mellitus without complications: Secondary | ICD-10-CM

## 2015-03-31 DIAGNOSIS — Z23 Encounter for immunization: Secondary | ICD-10-CM | POA: Diagnosis not present

## 2015-03-31 DIAGNOSIS — N649 Disorder of breast, unspecified: Secondary | ICD-10-CM

## 2015-03-31 DIAGNOSIS — M23200 Derangement of unspecified lateral meniscus due to old tear or injury, right knee: Secondary | ICD-10-CM

## 2015-03-31 DIAGNOSIS — I10 Essential (primary) hypertension: Secondary | ICD-10-CM

## 2015-03-31 DIAGNOSIS — E785 Hyperlipidemia, unspecified: Secondary | ICD-10-CM

## 2015-03-31 DIAGNOSIS — E1142 Type 2 diabetes mellitus with diabetic polyneuropathy: Secondary | ICD-10-CM | POA: Diagnosis not present

## 2015-03-31 DIAGNOSIS — M23203 Derangement of unspecified medial meniscus due to old tear or injury, right knee: Secondary | ICD-10-CM

## 2015-03-31 DIAGNOSIS — M79643 Pain in unspecified hand: Secondary | ICD-10-CM

## 2015-03-31 DIAGNOSIS — M23206 Derangement of unspecified meniscus due to old tear or injury, right knee: Secondary | ICD-10-CM

## 2015-03-31 DIAGNOSIS — E1165 Type 2 diabetes mellitus with hyperglycemia: Secondary | ICD-10-CM

## 2015-03-31 DIAGNOSIS — E669 Obesity, unspecified: Secondary | ICD-10-CM

## 2015-03-31 DIAGNOSIS — E559 Vitamin D deficiency, unspecified: Secondary | ICD-10-CM | POA: Diagnosis not present

## 2015-03-31 DIAGNOSIS — E1169 Type 2 diabetes mellitus with other specified complication: Secondary | ICD-10-CM

## 2015-03-31 NOTE — Progress Notes (Signed)
Pre visit review using our clinic review tool, if applicable. No additional management support is needed unless otherwise documented below in the visit note. 

## 2015-03-31 NOTE — Assessment & Plan Note (Signed)
Tolerating statin, encouraged heart healthy diet, avoid trans fats, minimize simple carbs and saturated fats. Increase exercise as tolerated 

## 2015-03-31 NOTE — Assessment & Plan Note (Signed)
Well controlled, no changes to meds. Encouraged heart healthy diet such as the DASH diet and exercise as tolerated.  °

## 2015-03-31 NOTE — Patient Instructions (Addendum)
Salonpas or Aspercreme to hand. Increase Humalin N to 38 unit  until you see the endocrinologist.  Basic Carbohydrate Counting for Diabetes Mellitus Carbohydrate counting is a method for keeping track of the amount of carbohydrates you eat. Eating carbohydrates naturally increases the level of sugar (glucose) in your blood, so it is important for you to know the amount that is okay for you to have in every meal. Carbohydrate counting helps keep the level of glucose in your blood within normal limits. The amount of carbohydrates allowed is different for every person. A dietitian can help you calculate the amount that is right for you. Once you know the amount of carbohydrates you can have, you can count the carbohydrates in the foods you want to eat. Carbohydrates are found in the following foods:  Grains, such as breads and cereals.  Dried beans and soy products.  Starchy vegetables, such as potatoes, peas, and corn.  Fruit and fruit juices.  Milk and yogurt.  Sweets and snack foods, such as cake, cookies, candy, chips, soft drinks, and fruit drinks. CARBOHYDRATE COUNTING There are two ways to count the carbohydrates in your food. You can use either of the methods or a combination of both. Reading the "Nutrition Facts" on [place] The "Nutrition Facts" is an area that is included on the labels of almost all packaged food and beverages in the [place]. It includes the serving size of that food or beverage and information about the nutrients in each serving of the food, including the grams (g) of carbohydrate per serving.  Decide the number of servings of this food or beverage that you will be able to eat or drink. Multiply that number of servings by the number of grams of carbohydrate that is listed on the label for that serving. The total will be the amount of carbohydrates you will be having when you eat or drink this food or beverage. Learning Standard Serving Sizes of Food When  you eat food that is not packaged or does not include "Nutrition Facts" on the label, you need to measure the servings in order to count the amount of carbohydrates.A serving of most carbohydrate-rich foods contains about 15 g of carbohydrates. The following list includes serving sizes of carbohydrate-rich foods that provide 15 g ofcarbohydrate per serving:   1 slice of bread (1 oz) or 1 six-inch tortilla.    of a hamburger bun or English muffin.  4-6 crackers.   cup unsweetened dry cereal.    cup hot cereal.   cup rice or pasta.    cup mashed potatoes or  of a large baked potato.  1 cup fresh fruit or one small piece of fruit.    cup canned or frozen fruit or fruit juice.  1 cup milk.   cup plain fat-free yogurt or yogurt sweetened with artificial sweeteners.   cup cooked dried beans or starchy vegetable, such as peas, corn, or potatoes.  Decide the number of standard-size servings that you will eat. Multiply that number of servings by 15 (the grams of carbohydrates in that serving). For example, if you eat 2 cups of strawberries, you will have eaten 2 servings and 30 g of carbohydrates (2 servings x 15 g = 30 g). For foods such as soups and casseroles, in which more than one food is mixed in, you will need to count the carbohydrates in each food that is included. EXAMPLE OF CARBOHYDRATE COUNTING Sample Dinner  3 oz chicken breast.   cup  of brown rice.   cup of corn.  1 cup milk.   1 cup strawberries with sugar-free whipped topping.  Carbohydrate Calculation Step 1: Identify the foods that contain carbohydrates:   Rice.   Corn.   Milk.   Strawberries. Step 2:Calculate the number of servings eaten of each:   2 servings of rice.   1 serving of corn.   1 serving of milk.   1 serving of strawberries. Step 3: Multiply each of those number of servings by 15 g:   2 servings of rice x 15 g = 30 g.   1 serving of corn x 15 g = 15 g.    1 serving of milk x 15 g = 15 g.   1 serving of strawberries x 15 g = 15 g. Step 4: Add together all of the amounts to find the total grams of carbohydrates eaten: 30 g + 15 g + 15 g + 15 g = 75 g.   This information is not intended to replace advice given to you by your health care provider. Make sure you discuss any questions you have with your health care provider.   Document Released: 03/01/2005 Document Revised: 03/22/2014 Document Reviewed: 01/26/2013 Elsevier Interactive Patient Education [employer].

## 2015-03-31 NOTE — Progress Notes (Signed)
Subjective:    Patient ID: Michael Cunningham, male    DOB: [birth year], 70 y.o.   MRN: [id number]  Chief Complaint  Patient presents with  . Follow-up    HPI Patient is in today for follow-up. He is continuing to struggle with pain most notably in his knees and on his hands. His second and third fingers are the most painful and stiff. No swelling, injury or warmth. His insulin is up to 20-35 units 3 times daily for the regular and 20 and 36 for the NPH. Has had some numbers below 60 in the last month but not routinely. Is complaining of right breast tenderness and swelling under right area lab. No other acute concerns. Denies CP/palp/SOB/HA/congestion/fevers/GI or GU c/o. Taking meds as prescribed  Past Medical History  Diagnosis Date  . Hypertension   . Multiple lipomas     hx. mulitple and some remains -arms, legs  . Diverticulosis of colon     LEFT SIDE  . History of small bowel obstruction     X2  28 YRS AGO AND LAST ONE 04/2014--  RESOLVED WITHOUT SURGICAL INTERVENTION  . Type 2 diabetes mellitus ([place])   . Left ureteral calculus   . Complication of anesthesia     11'12 Surgery with excrutiating "head about to explode" upon awakening  -lasted several hours postop  ( SURGERY DONE 08-13-2011 AT [facility] PT DID OK POST OP)  . Measles =  . Hyperlipidemia   . Recurrent kidney stones 07/08/2013    Follows with Dr Gaynelle Arabian He believes they are Calcium oxalate stones  . Obesity, unspecified 09/18/2013  . CAD (coronary artery disease)     a. 05/2014 [place] s/p DES x4 to mid-distLAD and DESx1 to DI  . Vitamin D deficiency 07/02/2014  . Thrombocytopenia ([place]) 07/07/2014    Past Surgical History  Procedure Laterality Date  . Lipoma excision  1975    multiple, 10-12  . Knee arthroscopy  08/13/2011    right ARTHROSCOPY KNEE;  Surgeon: Gearlean Alf, MD;  Location: [facility];  Service: Orthopedics;  Laterality: Right;  medial  meniscal debridement, excision of plica, synovectomy  . Knee arthroscopy w/  meniscectomy Right 01-29-2011    twice, first trimmed mediscus and cyst  . Colonoscopy w/ polypectomy  LAST ONE 2013  . Knee arthroscopy Left 11/16/2013    Procedure: LEFT KNEE ARTHROSCOPY MEDIAL MENISCAL DEBRIDEMENT, CHONDROPLASTY;  Surgeon: Gearlean Alf, MD;  Location: [facility];  Service: Orthopedics;  Laterality: Left;  . Coronary stent placement  05/17/2014    PROXIMAL X 4  MID & DISTAL LAD  DIAGONAL   . Left heart catheterization with coronary angiogram N/A 05/17/2014    Procedure: LEFT HEART CATHETERIZATION WITH CORONARY ANGIOGRAM;  Surgeon: Burnell Blanks, MD;  Location[facility] CATH LAB;  Service: Cardiovascular;  Laterality: N/A;  . Percutaneous coronary stent intervention (pci-s)  05/17/2014    Procedure: PERCUTANEOUS CORONARY STENT INTERVENTION (PCI-S);  Surgeon: Burnell Blanks, MD;  Location: [facility] CATH LAB;  Service: Cardiovascular;;  Diag1 and Prox to Distal LAD    Family History  Problem Relation Age of Onset  . Hypertension Mother   . Cancer Mother 20    ovarian  . Heart disease Father     CHF  . Heart attack Father 61  . Cancer Sister     breast- just finished her last radiation  . Heart disease Maternal Grandfather     ?  [name] Heart attack Paternal Grandfather 70  .  Diabetes Neg Hx   . Heart attack Paternal Uncle 24    Sudden death    Social History   Social History  . Marital Status: Married    Spouse Name: N/A  . Number of Children: 2  . Years of Education: N/A   Occupational History  . Not on file.   Social History Main Topics  . Smoking status: Former Smoker -- 1.00 packs/day for 4 years    Types: Cigarettes    Quit date: 06/14/1967  . Smokeless tobacco: Never Used  . Alcohol Use: No  . Drug Use: No  . Sexual Activity: Yes     Comment: lives with wife   Other Topics Concern  . 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      Outpatient Prescriptions Prior to Visit  Medication Sig Dispense  Refill  . ACCU-CHEK FASTCLIX LANCETS MISC Use to test blood sugar 2 times daily. Dx: E11.40 204 each 3  . aspirin EC 81 MG tablet Take 81 mg by mouth daily with breakfast.     . cholecalciferol (VITAMIN D) 1000 UNITS tablet Take 3,000 Units by mouth daily.     [name] glucose blood (ACCU-CHEK AVIVA PLUS) test strip Use to test blood sugar 2 times daily. Dx: E11.40 200 each 3  . insulin regular (HUMULIN R) 100 units/mL injection Inject under skin 20-35 units 3 times a day (just before each meal), as advised. 30 mL 2  . losartan (COZAAR) 50 MG tablet TAKE 1&1/2 (75 MG TOTAL) TABLETS BY MOUTH EVERY MORNING 180 tablet 0  . metoprolol tartrate (LOPRESSOR) 25 MG tablet Take 1 tablet (25 mg total) by mouth 2 (two) times daily. 60 tablet 11  . Multiple Vitamin (MULTIVITAMIN WITH MINERALS) TABS tablet Take 1 tablet by mouth daily.    . prasugrel (EFFIENT) 10 MG TABS tablet Take 1 tablet (10 mg total) by mouth daily. 30 tablet 11  . simvastatin (ZOCOR) 40 MG tablet TAKE 1 TABLET (40 MG) BY MOUTH AT BEDTIME. 90 tablet 1  . TRUEPLUS INSULIN SYRINGE 30G X 5/16" 0.5 ML MISC USE SYRINGES TO INJECT INSULIN 4 TIMES DAILY 200 each 2  . Vitamin D, Ergocalciferol, (DRISDOL) 50000 units CAPS capsule TAKE 1 CAPSULE (50,000 UNITS) BY MOUTH EVERY 7 DAYS FOR 12 WEEKS 4 capsule 3  . insulin NPH Human (HUMULIN N) 100 UNIT/ML injection INJECT UNDER SKIN 20 UNITS in am and 36 units at bedtime 20 mL 6  . Vitamin D, Ergocalciferol, (DRISDOL) 50000 UNITS CAPS capsule Take 1 capsule (50,000 Units total) by mouth every 7 (seven) days. For 12 weeks 4 capsule 3  . acetaminophen (TYLENOL) 500 MG tablet Take 1,000 mg by mouth every 6 (six) hours as needed for mild pain. Reported on 03/31/2015    . nitroGLYCERIN (NITROSTAT) 0.4 MG SL tablet Place 1 tablet (0.4 mg total) under the tongue every 5 (five) minutes as needed for chest pain. 90 tablet 3  . Polyethylene Glycol 3350 (MIRALAX PO) Take by mouth as needed. Reported on 03/31/2015     No  facility-administered medications prior to visit.    Allergies  Allergen Reactions  . Ciprofloxacin Other (See Comments)    Abdominal pain  . Lipitor [Atorvastatin Calcium] Other (See Comments)    Severe muscle pain  . Lisinopril Cough  . Metformin And Related Diarrhea    Review of Systems  Constitutional: Positive for malaise/fatigue. Negative for fever and chills.  HENT: Negative  for congestion and hearing loss.   Eyes: Negative for discharge.  Respiratory: Negative for cough, sputum production and shortness of breath.   Cardiovascular: Negative for chest pain, palpitations and leg swelling.  Gastrointestinal: Negative for heartburn, nausea, vomiting, abdominal pain, diarrhea, constipation and blood in stool.  Genitourinary: Negative for dysuria, urgency, frequency and hematuria.  Musculoskeletal: Positive for back pain and joint pain. Negative for myalgias and falls.  Skin: Negative for rash.  Neurological: Negative for dizziness, sensory change, loss of consciousness, weakness and headaches.  Endo/Heme/Allergies: Negative for environmental allergies. Does not bruise/bleed easily.  Psychiatric/Behavioral: Negative for depression and suicidal ideas. The patient is not nervous/anxious and does not have insomnia.        Objective:    Physical Exam  Constitutional: He is oriented to person, place, and time. He appears well-developed and well-nourished. No distress.  HENT:  Head: Normocephalic and atraumatic.  Eyes: Conjunctivae are normal.  Neck: Neck supple. No thyromegaly present.  Cardiovascular: Normal rate, regular rhythm and normal heart sounds.   No murmur heard. Pulmonary/Chest: Effort normal and breath sounds normal. No respiratory distress. He has no wheezes.  Abdominal: Soft. Bowel sounds are normal. He exhibits no mass. There is no tenderness.  Genitourinary:  Right breast has a 1x2 cm firm,  Tender, mobile lesion under areola  Musculoskeletal: He exhibits no  edema.  Lymphadenopathy:    He has no cervical adenopathy.  Neurological: He is alert and oriented to person, place, and time.  Skin: Skin is warm and dry.  Psychiatric: He has a normal mood and affect. His behavior is normal.    BP 120/62 mmHg  Pulse 69  Temp(Src) 97.6 F (36.4 C) (Oral)  Ht 5\' 10"  (1.778 m)  Wt 257 lb (116.574 kg)  BMI 36.88 kg/m2  SpO2 95% Wt Readings from Last 3 Encounters:  04/03/15 255 lb 9.6 oz (115.939 kg)  03/31/15 257 lb (116.574 kg)  01/02/15 253 lb (114.76 kg)     Lab Results  Component Value Date   WBC 6.2 03/26/2015   HGB 14.9 03/26/2015   HCT 43.7 03/26/2015   PLT 167.0 03/26/2015   GLUCOSE 172* 03/26/2015   CHOL 141 03/26/2015   TRIG 183.0* 03/26/2015   HDL 33.30* 03/26/2015   LDLDIRECT 108.0 04/16/2014   LDLCALC 71 03/26/2015   ALT 26 03/26/2015   AST 23 03/26/2015   NA 138 03/26/2015   K 4.3 03/26/2015   CL 103 03/26/2015   CREATININE 0.80 03/26/2015   BUN 15 03/26/2015   CO2 27 03/26/2015   TSH 1.84 03/26/2015   PSA 1.24 07/24/2014   INR 1.02 05/17/2014   HGBA1C 7.4* 03/26/2015   MICROALBUR 0.8 03/21/2014    Lab Results  Component Value Date   TSH 1.84 03/26/2015   Lab Results  Component Value Date   WBC 6.2 03/26/2015   HGB 14.9 03/26/2015   HCT 43.7 03/26/2015   MCV 92.1 03/26/2015   PLT 167.0 03/26/2015   Lab Results  Component Value Date   NA 138 03/26/2015   K 4.3 03/26/2015   CO2 27 03/26/2015   GLUCOSE 172* 03/26/2015   BUN 15 03/26/2015   CREATININE 0.80 03/26/2015   BILITOT 0.6 03/26/2015   ALKPHOS 82 03/26/2015   AST 23 03/26/2015   ALT 26 03/26/2015   PROT 6.7 03/26/2015   ALBUMIN 4.0 03/26/2015   CALCIUM 9.3 03/26/2015   ANIONGAP 8 05/18/2014   GFR 101.36 03/26/2015   Lab Results  Component Value Date  CHOL 141 03/26/2015   Lab Results  Component Value Date   HDL 33.30* 03/26/2015   Lab Results  Component Value Date   LDLCALC 71 03/26/2015   Lab Results  Component Value Date     TRIG 183.0* 03/26/2015   Lab Results  Component Value Date   CHOLHDL 4 03/26/2015   Lab Results  Component Value Date   HGBA1C 7.4* 03/26/2015       Assessment & Plan:   Problem List Items Addressed This Visit    Breast lesion    Initially ordered ultrasound, radiology redirected to [facility] which confirmed gynecomastia and not a malignant process.      Relevant Orders   US BREAST LTD UNI RIGHT INC AXILLA   Chronic meniscal tear of knee, right   Diabetes mellitus type 2 in obese (HCC)    hgba1c acceptable, minimize simple carbs. Increase exercise as tolerated. Continue current meds      Hyperlipidemia    Tolerating statin, encouraged heart healthy diet, avoid trans fats, minimize simple carbs and saturated fats. Increase exercise as tolerated      Relevant Orders   Lipid panel   Hypertension (Chronic)    Well controlled, no changes to meds. Encouraged heart healthy diet such as the DASH diet and exercise as tolerated.       Relevant Orders   TSH   Comprehensive metabolic panel   CBC   Obesity    Encouraged DASH diet, decrease po intake and increase exercise as tolerated. Needs 7-8 hours of sleep nightly. Avoid trans fats, eat small, frequent meals every 4-5 hours with lean proteins, complex carbs and healthy fats. Minimize simple carbs      Pain in limb    Follows with Dr Maureen Ralphs of ortho due to joint pain, encouraged to try topical gels for hand pain      Relevant Orders   Rheumatoid factor   Poorly controlled type 2 diabetes mellitus with peripheral neuropathy ([place])    HGBA1C has worsened has an appt with endocrinology later this week. Til seen by Endocrinology increase Insulin N to 38 units, has not had any low numbers in am      Relevant Orders   Hemoglobin A1c   Microalbumin / creatinine urine ratio   Vitamin D deficiency    Improving with supplements, continue 50000 IU q week and increase daily Vit D to 5000 IU recheck in 3 months      Relevant Orders    VITAMIN D 25 Hydroxy (Vit-D Deficiency, Fractures)    Other Visit Diagnoses    Need for prophylactic vaccination against Streptococcus pneumoniae (pneumococcus)    -  Primary    Relevant Orders    Pneumococcal polysaccharide vaccine 23-valent greater than or equal to 2yo subcutaneous/IM (Completed)    Hyperlipemia           I am having Mr. Gopalan maintain his aspirin EC, multivitamin with minerals, acetaminophen, nitroGLYCERIN, metoprolol tartrate, prasugrel, cholecalciferol, Polyethylene Glycol 3350 (MIRALAX PO), simvastatin, insulin regular, glucose blood, ACCU-CHEK FASTCLIX LANCETS, TRUEPLUS INSULIN SYRINGE, losartan, and Vitamin D (Ergocalciferol).  No orders of the defined types were placed in this encounter.     Willette Alma, MD

## 2015-03-31 NOTE — Assessment & Plan Note (Signed)
Encouraged DASH diet, decrease po intake and increase exercise as tolerated. Needs 7-8 hours of sleep nightly. Avoid trans fats, eat small, frequent meals every 4-5 hours with lean proteins, complex carbs and healthy fats. Minimize simple carbs 

## 2015-03-31 NOTE — Assessment & Plan Note (Signed)
Improving with supplements, continue 50000 IU q week and increase daily Vit D to 5000 IU recheck in 3 months

## 2015-03-31 NOTE — Assessment & Plan Note (Addendum)
HGBA1C has worsened has an appt with endocrinology later this week. Til seen by Endocrinology increase Insulin N to 38 units, has not had any low numbers in am

## 2015-04-03 ENCOUNTER — Encounter: Payer: Self-pay | Admitting: Internal Medicine

## 2015-04-03 ENCOUNTER — Ambulatory Visit (INDEPENDENT_AMBULATORY_CARE_PROVIDER_SITE_OTHER): Payer: Medicare Other | Admitting: Internal Medicine

## 2015-04-03 VITALS — BP 114/68 | HR 74 | Temp 97.5°F | Resp 12 | Wt 255.6 lb

## 2015-04-03 DIAGNOSIS — E1142 Type 2 diabetes mellitus with diabetic polyneuropathy: Secondary | ICD-10-CM

## 2015-04-03 DIAGNOSIS — E1165 Type 2 diabetes mellitus with hyperglycemia: Secondary | ICD-10-CM | POA: Diagnosis not present

## 2015-04-03 MED ORDER — INSULIN NPH (HUMAN) (ISOPHANE) 100 UNIT/ML ~~LOC~~ SUSP: SUBCUTANEOUS | Status: DC

## 2015-04-03 NOTE — Patient Instructions (Signed)
Please change the insulin doses as follows:  Insulin Before breakfast Before lunch Before dinner Bedtime  Regular 30  35 >> 30  35    NPH 20   38 >> 42   Please come back for a follow-up appointment in 3 months.

## 2015-04-03 NOTE — Progress Notes (Signed)
Patient ID: Michael Cunningham, male   DOB: October 07, 1945, 70 y.o.   MRN: [id number]  HPI: Michael Cunningham is a 70 y.o.-year-old male, returning for f/u DM2, dx in 1999, insulin-dependent since ~2008, uncontrolled, with complications (CAD, peripheral neuropathy, DR). Last visit 3 mo ago.  Last hemoglobin A1c was even higher: Lab Results  Component Value Date   HGBA1C 7.4* 03/26/2015   HGBA1C 7.2* 12/19/2014   HGBA1C 6.9* 07/24/2014   Pt is on a regimen of:  Insulin Before breakfast Before lunch Before dinner Bedtime  Regular 30 + SSI 35 + SSI 35 + SSI   NPH 20   38   Please add the following Sliding scale of R insulin (SSI) - not using: - 150-175: + 1 unit  - 176-200: + 2 units  - 201-225: + 3 units  - 226-250: + 4 units  - 251-275: + 5 units - 276-300: + 6 units  Please do not use more than 20 units of R insulin before a meal prior to exercise (rehab, working in the yard, etc.)  He cannot afford analog insulins. He tried Metformin R and XR >> diarrhea He tried Januvia.  Pt checks his sugars 0-1 a day: - am: 90-170 >> 131-190 >> 143-205, 222 >> 120-199 - 2h after b'fast: n/c >> 177 >> n.c - before lunch: 70-110 >> 87-168, 250 >> 84, 88 >> 82  - 2h after lunch: n/c >> 76 >> 70 - before dinner: 100-150 >> 73, 89-129, 207 >> 70 >> 72 - 2h after dinner: n/c - bedtime: 100-160 >> 137 >> n/c >> 73, 141-186 - nighttime: lows >> wake him up >> corrects them (cookie) >> 90 >> n/c + fewer lows - Lowest sugar was 70 >> 70; he has hypoglycemia awareness at 90.  Highest sugar was 270 >> 250 >> 222 >> 206  Glucometer: AccuChek  Pt's meals are: - Breakfast: cheerios + skim milk + banana - Lunch: sandwich or leftover  - Dinner: meat + veggies + starch - Snacks: PB crackers; wheat thins, apple or grapes, pear   - no CKD, last BUN/creatinine:  Lab Results  Component Value Date   BUN 15 03/26/2015   CREATININE 0.80 03/26/2015   on losartan - last set of lipids: Lab Results  Component  Value Date   CHOL 141 03/26/2015   HDL 33.30* 03/26/2015   LDLCALC 71 03/26/2015   LDLDIRECT 108.0 04/16/2014   TRIG 183.0* 03/26/2015   CHOLHDL 4 03/26/2015   on simvastatin - last eye exam was in 11/26/2014. + DR (mild). + angle-closure glaucoma >> had surgery in both eyes >> had blurry vision L eye >> resolved.  - no numbness and tingling in his L foot. He had a foot exam 12/2014 (dorsum L hallux + fungal inf) - [facility] (Dr Michael Cunningham). Very mild PN in R foot.   He also has a history of hypertension and hyperlipidemia.  ROS: Constitutional: no weight gain/loss, no fatigue, no subjective hyperthermia/hypothermia, + poor sleep Eyes: + blurry vision, no xerophthalmia ENT: no sore throat, no nodules palpated in throat, no dysphagia/odynophagia, no hoarseness, + tinnitus Cardiovascular: no CP/SOB/palpitations/leg swelling Respiratory: no cough/SOB Gastrointestinal: no N/V/D/C Musculoskeletal: no muscle/joint aches Skin: no rashes Neurological: no tremors/numbness/tingling/dizziness  I reviewed pt's medications, allergies, PMH, social hx, family hx, and changes were documented in the history of present illness. Otherwise, unchanged from my initial visit note:  Past Medical History  Diagnosis Date  . Hypertension   . Multiple  lipomas     hx. mulitple and some remains -arms, legs  . Diverticulosis of colon     LEFT SIDE  . History of small bowel obstruction     X2  28 YRS AGO AND LAST ONE 04/2014--  RESOLVED WITHOUT SURGICAL INTERVENTION  . Type 2 diabetes mellitus ([place])   . Left ureteral calculus   . Complication of anesthesia     11'12 Surgery with excrutiating "head about to explode" upon awakening  -lasted several hours postop  ( SURGERY DONE 08-13-2011 AT [facility] PT DID OK POST OP)  . Measles =  . Hyperlipidemia   . Recurrent kidney stones 07/08/2013    Follows with Dr Michael Cunningham He believes they are Calcium oxalate stones  . Obesity, unspecified 09/18/2013  . CAD  (coronary artery disease)     a. 05/2014 [place] s/p DES x4 to mid-distLAD and DESx1 to DI  . Vitamin D deficiency 07/02/2014  . Thrombocytopenia ([place]) 07/07/2014   Past Surgical History  Procedure Laterality Date  . Lipoma excision  1975    multiple, 10-12  . Knee arthroscopy  08/13/2011    right ARTHROSCOPY KNEE;  Surgeon: Gearlean Alf, MD;  Location: [facility];  Service: Orthopedics;  Laterality: Right;  medial  meniscal debridement, excision of plica, synovectomy  . Knee arthroscopy w/ meniscectomy Right 01-29-2011    twice, first trimmed mediscus and cyst  . Colonoscopy w/ polypectomy  LAST ONE 2013  . Knee arthroscopy Left 11/16/2013    Procedure: LEFT KNEE ARTHROSCOPY MEDIAL MENISCAL DEBRIDEMENT, CHONDROPLASTY;  Surgeon: Gearlean Alf, MD;  Location: [facility];  Service: Orthopedics;  Laterality: Left;  . Coronary stent placement  05/17/2014    PROXIMAL X 4  MID & DISTAL LAD  DIAGONAL   . Left heart catheterization with coronary angiogram N/A 05/17/2014    Procedure: LEFT HEART CATHETERIZATION WITH CORONARY ANGIOGRAM;  Surgeon: Burnell Blanks, MD;  Location: [facility] CATH LAB;  Service: Cardiovascular;  Laterality: N/A;  . Percutaneous coronary stent intervention (pci-s)  05/17/2014    Procedure: PERCUTANEOUS CORONARY STENT INTERVENTION (PCI-S);  Surgeon: Burnell Blanks, MD;  Location: [facility] CATH LAB;  Service: Cardiovascular;;  Diag1 and Prox to Distal LAD   History   Social History  . Marital Status: Married    Spouse Name: N/A  . Number of Children: 2   Social History Main Topics  . Smoking status: Former Smoker -- 1.00 packs/day for 4 years    Types: Cigarettes    Quit date: 06/14/1967  . Smokeless tobacco: Never Used  . Alcohol Use: No  . Drug Use: No  . 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     Current Outpatient Prescriptions on File Prior to Visit  Medication Sig Dispense  Refill  . ACCU-CHEK FASTCLIX LANCETS MISC Use to test blood sugar 2 times daily. Dx: E11.40 204 each 3  . acetaminophen (TYLENOL) 500 MG tablet Take 1,000 mg by mouth every 6 (six) hours as needed for mild pain. Reported on 03/31/2015    . aspirin EC 81 MG tablet Take 81 mg by mouth daily with breakfast.     . cholecalciferol (VITAMIN D) 1000 UNITS tablet Take 3,000 Units by mouth daily.     [name] glucose blood (ACCU-CHEK AVIVA PLUS) test strip Use to test blood sugar 2 times daily. Dx: E11.40 200 each 3  . insulin NPH Human (HUMULIN  N) 100 UNIT/ML injection INJECT UNDER SKIN 20 UNITS in am and 36 units at bedtime 20 mL 6  . insulin regular (HUMULIN R) 100 units/mL injection Inject under skin 20-35 units 3 times a day (just before each meal), as advised. 30 mL 2  . losartan (COZAAR) 50 MG tablet TAKE 1&1/2 (75 MG TOTAL) TABLETS BY MOUTH EVERY MORNING 180 tablet 0  . metoprolol tartrate (LOPRESSOR) 25 MG tablet Take 1 tablet (25 mg total) by mouth 2 (two) times daily. 60 tablet 11  . Multiple Vitamin (MULTIVITAMIN WITH MINERALS) TABS tablet Take 1 tablet by mouth daily.    . nitroGLYCERIN (NITROSTAT) 0.4 MG SL tablet Place 1 tablet (0.4 mg total) under the tongue every 5 (five) minutes as needed for chest pain. (Patient not taking: Reported on 03/31/2015) 90 tablet 3  . Polyethylene Glycol 3350 (MIRALAX PO) Take by mouth as needed. Reported on 03/31/2015    . prasugrel (EFFIENT) 10 MG TABS tablet Take 1 tablet (10 mg total) by mouth daily. 30 tablet 11  . simvastatin (ZOCOR) 40 MG tablet TAKE 1 TABLET (40 MG) BY MOUTH AT BEDTIME. 90 tablet 1  . TRUEPLUS INSULIN SYRINGE 30G X 5/16" 0.5 ML MISC USE SYRINGES TO INJECT INSULIN 4 TIMES DAILY 200 each 2  . Vitamin D, Ergocalciferol, (DRISDOL) 50000 units CAPS capsule TAKE 1 CAPSULE (50,000 UNITS) BY MOUTH EVERY 7 DAYS FOR 12 WEEKS 4 capsule 3   No current facility-administered medications on file prior to visit.   Allergies  Allergen Reactions  .  Ciprofloxacin Other (See Comments)    Abdominal pain  . Lipitor [Atorvastatin Calcium] Other (See Comments)    Severe muscle pain  . Lisinopril Cough  . Metformin And Related Diarrhea   Family History  Problem Relation Age of Onset  . Hypertension Mother   . Cancer Mother 61    ovarian  . Heart disease Father     CHF  . Heart attack Father 21  . Cancer Sister     breast- just finished her last radiation  . Heart disease Maternal Grandfather     ?  [name] Heart attack Paternal Grandfather 3  . Diabetes Neg Hx   . Heart attack Paternal Uncle 14    Sudden death   PE: BP 114/68 mmHg  Pulse 74  Temp(Src) 97.5 F (36.4 C) (Oral)  Resp 12  Wt 255 lb 9.6 oz (115.939 kg)  SpO2 96% Body mass index is 36.67 kg/(m^2).  Wt Readings from Last 3 Encounters:  04/03/15 255 lb 9.6 oz (115.939 kg)  03/31/15 257 lb (116.574 kg)  01/02/15 253 lb (114.76 kg)   Constitutional: Obese, in NAD Eyes: PERRLA, EOMI, no exophthalmos ENT: moist mucous membranes, no thyromegaly, no cervical lymphadenopathy Cardiovascular: RRR, No MRG Respiratory: CTA B Gastrointestinal: abdomen soft, NT, ND, BS+ Musculoskeletal: no deformities, strength intact in all 4 Skin: moist, warm, + rash: Vitiligo on arms Neurological: no tremor with outstretched hands, DTR normal in all 4  ASSESSMENT: 1. DM2, insulin-dependent, uncontrolled, with complications - CAD, status post stent 05/2014 - Dr Percival Spanish - Peripheral neuropathy - DR  PLAN:  1. Patient with long-standing, uncontrolled diabetes (last HbA1c was higher after the Holidays - 7.4% at last check this month). As he had high CBGs in am and low CBGs later in the day >> at last visit, we decreased his insulin bolus doses and increased NPH at bedtime. Sugars better this month, after the Holidays. Sugars in am still higher >> will increase  the NPH at night. We also decreased lunchtime insulin as he has lows before dinner. Patient Instructions   Please change the  insulin doses as follows:  Insulin Before breakfast Before lunch Before dinner Bedtime  Regular 30  35 >> 30  35    NPH 20   38 >> 42   Please come back for a follow-up appointment in 3 months.  - I advised him to continue to check  at least 3 times a day, rotating checks - advised for yearly eye exams >> He is UTD - had the flu shot this season - Return to clinic in 3 mo with sugar log

## 2015-04-04 ENCOUNTER — Other Ambulatory Visit: Payer: Self-pay | Admitting: Family Medicine

## 2015-04-04 ENCOUNTER — Ambulatory Visit
Admission: RE | Admit: 2015-04-04 | Discharge: 2015-04-04 | Disposition: A | Discharge status: Home / Self Care | Payer: Medicare Other | Source: Ambulatory Visit | Attending: Family Medicine | Admitting: Family Medicine

## 2015-04-04 ENCOUNTER — Telehealth: Payer: Self-pay | Admitting: Family Medicine

## 2015-04-04 DIAGNOSIS — N649 Disorder of breast, unspecified: Secondary | ICD-10-CM

## 2015-04-04 DIAGNOSIS — N62 Hypertrophy of breast: Secondary | ICD-10-CM | POA: Diagnosis not present

## 2015-04-04 NOTE — Telephone Encounter (Signed)
Received phone call from [place] regarding right sided nodule under areola. The decision was made to proceed with MGM vs [place] to rule out breast cancer. His imaging was c/w gynecomastia and no sign of breast CA or abscess was noted. Decision was made not to proceed with [place] due to no further drainage or acute concerns.

## 2015-04-06 ENCOUNTER — Encounter: Payer: Self-pay | Admitting: Family Medicine

## 2015-04-06 DIAGNOSIS — M79609 Pain in unspecified limb: Secondary | ICD-10-CM | POA: Insufficient documentation

## 2015-04-06 DIAGNOSIS — N649 Disorder of breast, unspecified: Secondary | ICD-10-CM | POA: Insufficient documentation

## 2015-04-06 NOTE — Assessment & Plan Note (Signed)
hgba1c acceptable, minimize simple carbs. Increase exercise as tolerated. Continue current meds 

## 2015-04-06 NOTE — Assessment & Plan Note (Signed)
Initially ordered ultrasound, radiology redirected to [facility] which confirmed gynecomastia and not a malignant process.

## 2015-04-06 NOTE — Assessment & Plan Note (Signed)
Follows with Dr Maureen Ralphs of ortho due to joint pain, encouraged to try topical gels for hand pain

## 2015-04-07 ENCOUNTER — Telehealth: Payer: Self-pay | Admitting: Family Medicine

## 2015-04-07 NOTE — Telephone Encounter (Signed)
Called the patient informed of PCP instructions 

## 2015-04-07 NOTE — Telephone Encounter (Signed)
Could try Aspercreme you can get a version without any menthol see if that helps and he can try this for his areola discomfort as well

## 2015-04-07 NOTE — Telephone Encounter (Signed)
The patient would like another alternative to [employer] as they do not stay on the knee and hands with movement.  The cream will not work as has menthol odor which is very strong and the patients wife has allergies to.

## 2015-04-15 ENCOUNTER — Other Ambulatory Visit: Payer: Self-pay | Admitting: Internal Medicine

## 2015-04-15 MED FILL — METOPROLOL TARTRATE 25 MG T: 25 | 30 days supply | Qty: 60 | Fill #10

## 2015-04-15 MED FILL — EFFIENT 10 MG TABLET: 10 | 30 days supply | Qty: 30 | Fill #10

## 2015-04-21 ENCOUNTER — Other Ambulatory Visit: Payer: Self-pay | Admitting: *Deleted

## 2015-04-21 MED ORDER — INSULIN NPH (HUMAN) (ISOPHANE) 100 UNIT/ML ~~LOC~~ SUSP: SUBCUTANEOUS | Status: DC

## 2015-04-21 MED FILL — HumuLIN R 100 UNIT/ML SOLN: 100 | 29 days supply | Qty: 30 | Fill #0

## 2015-04-22 MED FILL — HumuLIN N 100 UNIT/ML SUSP: 100 | 32 days supply | Qty: 20 | Fill #0

## 2015-04-25 ENCOUNTER — Encounter: Payer: Self-pay | Admitting: Cardiology

## 2015-04-25 ENCOUNTER — Ambulatory Visit (INDEPENDENT_AMBULATORY_CARE_PROVIDER_SITE_OTHER): Payer: Medicare Other | Admitting: Cardiology

## 2015-04-25 VITALS — BP 130/82 | HR 92 | Ht 70.0 in | Wt 252.7 lb

## 2015-04-25 DIAGNOSIS — R0683 Snoring: Secondary | ICD-10-CM

## 2015-04-25 DIAGNOSIS — I2510 Atherosclerotic heart disease of native coronary artery without angina pectoris: Secondary | ICD-10-CM | POA: Diagnosis not present

## 2015-04-25 MED ORDER — LOSARTAN POTASSIUM 50 MG PO TABS: ORAL_TABLET | ORAL | Status: DC

## 2015-04-25 MED ORDER — METOPROLOL TARTRATE 25 MG PO TABS: 25.0000 mg | ORAL_TABLET | Freq: Two times a day (BID) | ORAL | Status: AC

## 2015-04-25 MED ORDER — PRASUGREL HCL 10 MG PO TABS: 10.0000 mg | ORAL_TABLET | Freq: Every day | ORAL | Status: DC

## 2015-04-25 NOTE — Progress Notes (Signed)
Cardiology Office Note   Date:  04/25/2015   ID:  Michael Cunningham, DOB Mar 16, 1945, MRN [id number]  PCP:  Penni Homans, MD  Cardiologist:   Minus Breeding, MD   CAD  History of Present Illness: Michael Cunningham is a 70 y.o. male who presents for evaluation of CAD. I saw him in February of last year and sent him for a stress test which was high risk. He subsequently was found to have severe diffuse disease in the proximal mid and distal LAD and had PTCA and DES stenting 4. He had severe stenosis in her first diagonal also treated with drug-eluting stent.    He has been doing well. He gets none of the symptoms that he was having.  The patient denies any new symptoms such as chest discomfort, neck or arm discomfort. There has been no new shortness of breath, PND or orthopnea. There have been no reported palpitations, presyncope or syncope.   Past Medical History  Diagnosis Date  . Hypertension   . Multiple lipomas     hx. mulitple and some remains -arms, legs  . Diverticulosis of colon     LEFT SIDE  . History of small bowel obstruction     X2  28 YRS AGO AND LAST ONE 04/2014--  RESOLVED WITHOUT SURGICAL INTERVENTION  . Type 2 diabetes mellitus ([place])   . Left ureteral calculus   . Complication of anesthesia     11'12 Surgery with excrutiating "head about to explode" upon awakening  -lasted several hours postop  ( SURGERY DONE 08-13-2011 AT [facility] PT DID OK POST OP)  . Measles =  . Hyperlipidemia   . Recurrent kidney stones 07/08/2013    Follows with Dr Gaynelle Arabian He believes they are Calcium oxalate stones  . Obesity, unspecified 09/18/2013  . CAD (coronary artery disease)     a. 05/2014 [place] s/p DES x4 to mid-distLAD and DESx1 to DI  . Vitamin D deficiency 07/02/2014  . Thrombocytopenia ([place]) 07/07/2014    Past Surgical History  Procedure Laterality Date  . Lipoma excision  1975    multiple, 10-12  . Knee arthroscopy  08/13/2011    right ARTHROSCOPY KNEE;  Surgeon: Gearlean Alf, MD;   Location: [facility];  Service: Orthopedics;  Laterality: Right;  medial  meniscal debridement, excision of plica, synovectomy  . Knee arthroscopy w/ meniscectomy Right 01-29-2011    twice, first trimmed mediscus and cyst  . Colonoscopy w/ polypectomy  LAST ONE 2013  . Knee arthroscopy Left 11/16/2013    Procedure: LEFT KNEE ARTHROSCOPY MEDIAL MENISCAL DEBRIDEMENT, CHONDROPLASTY;  Surgeon: Gearlean Alf, MD;  Location: [facility];  Service: Orthopedics;  Laterality: Left;  . Coronary stent placement  05/17/2014    PROXIMAL X 4  MID & DISTAL LAD  DIAGONAL   . Left heart catheterization with coronary angiogram N/A 05/17/2014    Procedure: LEFT HEART CATHETERIZATION WITH CORONARY ANGIOGRAM;  Surgeon: Burnell Blanks, MD;  Location: [facility] CATH LAB;  Service: Cardiovascular;  Laterality: N/A;  . Percutaneous coronary stent intervention (pci-s)  05/17/2014    Procedure: PERCUTANEOUS CORONARY STENT INTERVENTION (PCI-S);  Surgeon: Burnell Blanks, MD;  Location: [facility] CATH LAB;  Service: Cardiovascular;;  Diag1 and Prox to Distal LAD     Current Outpatient Prescriptions  Medication Sig Dispense Refill  . ACCU-CHEK FASTCLIX LANCETS MISC Use to test blood sugar 2 times daily. Dx: E11.40 204 each 3  . acetaminophen (TYLENOL) 500 MG tablet Take 1,000 mg by  mouth every 6 (six) hours as needed for mild pain. Reported on 03/31/2015    . aspirin EC 81 MG tablet Take 81 mg by mouth daily with breakfast.     . benzonatate (TESSALON) 100 MG capsule Take 100 mg by mouth 3 (three) times daily as needed for cough.    . cholecalciferol (VITAMIN D) 1000 UNITS tablet Take 5,000 Units by mouth daily.     [name] glucose blood (ACCU-CHEK AVIVA PLUS) test strip Use to test blood sugar 2 times daily. Dx: E11.40 200 each 3  . insulin NPH Human (HUMULIN N) 100 UNIT/ML injection INJECT UNDER SKIN 20 UNITS in am and 42 units at bedtime 20 mL 6  . insulin regular (HUMULIN R) 100 units/mL injection Inject 0.3-0.35 mLs (30-35 Units  total) into the skin 3 (three) times daily before meals. 30 mL 2  . losartan (COZAAR) 50 MG tablet TAKE 1&1/2 (75 MG TOTAL) TABLETS BY MOUTH EVERY MORNING 180 tablet 0  . metoprolol tartrate (LOPRESSOR) 25 MG tablet Take 1 tablet (25 mg total) by mouth 2 (two) times daily. 60 tablet 11  . Multiple Vitamin (MULTIVITAMIN WITH MINERALS) TABS tablet Take 1 tablet by mouth daily.    . nitroGLYCERIN (NITROSTAT) 0.4 MG SL tablet Place 1 tablet (0.4 mg total) under the tongue every 5 (five) minutes as needed for chest pain. 90 tablet 3  . Polyethylene Glycol 3350 (MIRALAX PO) Take by mouth as needed. Reported on 03/31/2015    . prasugrel (EFFIENT) 10 MG TABS tablet Take 1 tablet (10 mg total) by mouth daily. 30 tablet 11  . simvastatin (ZOCOR) 40 MG tablet TAKE 1 TABLET (40 MG) BY MOUTH AT BEDTIME. 90 tablet 1  . TRUEPLUS INSULIN SYRINGE 30G X 5/16" 0.5 ML MISC USE SYRINGES TO INJECT INSULIN 4 TIMES DAILY 200 each 2  . Vitamin D, Ergocalciferol, (DRISDOL) 50000 units CAPS capsule TAKE 1 CAPSULE (50,000 UNITS) BY MOUTH EVERY 7 DAYS FOR 12 WEEKS 4 capsule 3   No current facility-administered medications for this visit.    Allergies:   Ciprofloxacin; Lipitor; Lisinopril; and Metformin and related    ROS:  Please see the history of present illness.   Otherwise, review of systems are positive for none.   All other systems are reviewed and negative.    PHYSICAL EXAM: VS:  BP 130/82 mmHg  Pulse 92  Ht 5\' 10"  (1.778 m)  Wt 252 lb 11.2 oz (114.624 kg)  BMI 36.26 kg/m2 , BMI Body mass index is 36.26 kg/(m^2). GENERAL:  Well appearing NECK:  No jugular venous distention, waveform within normal limits, carotid upstroke brisk and symmetric, no bruits, no thyromegaly LUNGS:  Clear to auscultation bilaterally CHEST:  Unremarkable HEART:  PMI not displaced or sustained,S1 and S2 within normal limits, no S3, no S4, no clicks, no rubs, no murmurs ABD:  Flat, positive bowel sounds normal in frequency in pitch, no  bruits, no rebound, no guarding, no midline pulsatile mass, no hepatomegaly, no splenomegaly EXT:  2 plus pulses throughout, no edema, no cyanosis no clubbing  EKG:  NSR, rate 92, RBBB, no acute St T wave changes.  04/25/2015  Recent Labs: 03/26/2015: ALT 26; BUN 15; Creatinine, Ser 0.80; Hemoglobin 14.9; Platelets 167.0; Potassium 4.3; Sodium 138; TSH 1.84    Lipid Panel    Component Value Date/Time   CHOL 141 03/26/2015 0817   TRIG 183.0* 03/26/2015 0817   HDL 33.30* 03/26/2015 0817   CHOLHDL 4 03/26/2015 0817   VLDL 36.6 03/26/2015  [place] 03/26/2015 0817   LDLDIRECT 108.0 04/16/2014 1022      Wt Readings from Last 3 Encounters:  04/25/15 252 lb 11.2 oz (114.624 kg)  04/03/15 255 lb 9.6 oz (115.939 kg)  03/31/15 257 lb (116.574 kg)      Other studies Reviewed: Additional studies/ records that were reviewed today include: Hospital records. Review of the above records demonstrates:  Please see elsewhere in the note.  See above   ASSESSMENT AND PLAN:  CAD/PCI/DES:  .  Patient is doing well. He will continue with risk reduction. No change in therapy is indicated.  Per Dr. Angelena Form, who did the procedure, he is on DAPT for life.  However, in March he could hold the Effient to have a colonoscopy.    HTN:  The blood pressure is at target. No change in medications is indicated. We will continue with therapeutic lifestyle changes (TLC).  OVERWEIGHT:  The patient understands the need to lose weight with diet and exercise. We have discussed specific strategies for this.  SNORING:  The patient has a high Epworth scale, snoring and probable sleep apnea.  I will order a sleep study.    Current medicines are reviewed at length with the patient today.  The patient does not have concerns regarding medicines.  The following changes have been made:  no change   Disposition:   FU me in six months.     Signed, Minus Breeding, MD  04/25/2015 8:42 AM    [facility]

## 2015-04-25 NOTE — Patient Instructions (Signed)
Medication Instructions:   NO CHANGE  Testing/Procedures:  Your physician has recommended that you have a sleep study. This test records several body functions during sleep, including: brain activity, eye movement, oxygen and carbon dioxide blood levels, heart rate and rhythm, breathing rate and rhythm, the flow of air through your mouth and nose, snoring, body muscle movements, and chest and belly movement.    Follow-Up:  Your physician wants you to follow-up in: [place] will receive a reminder letter in the mail two months in advance. If you don't receive a letter, please call our office to schedule the follow-up appointment.   If you need a refill on your cardiac medications before your next appointment, please call your pharmacy.

## 2015-04-30 ENCOUNTER — Ambulatory Visit: Payer: Medicare Other | Admitting: Cardiology

## 2015-04-30 MED FILL — VIT D2 1.25 MG (50,000 UNIT: 1.25 MG | 28 days supply | Qty: 4 | Fill #1

## 2015-05-16 ENCOUNTER — Other Ambulatory Visit: Payer: Self-pay | Admitting: *Deleted

## 2015-05-16 MED ORDER — GLUCOSE BLOOD VI STRP: ORAL_STRIP | Status: DC

## 2015-05-16 MED ORDER — ACCU-CHEK FASTCLIX LANCETS MISC: Status: DC

## 2015-05-19 ENCOUNTER — Encounter: Payer: Self-pay | Admitting: Family Medicine

## 2015-05-19 ENCOUNTER — Encounter: Payer: Self-pay | Admitting: Internal Medicine

## 2015-05-20 ENCOUNTER — Other Ambulatory Visit: Payer: Self-pay | Admitting: *Deleted

## 2015-05-20 ENCOUNTER — Ambulatory Visit (INDEPENDENT_AMBULATORY_CARE_PROVIDER_SITE_OTHER): Payer: Medicare Other | Admitting: Podiatry

## 2015-05-20 ENCOUNTER — Other Ambulatory Visit: Payer: Self-pay | Admitting: Family Medicine

## 2015-05-20 ENCOUNTER — Encounter: Payer: Self-pay | Admitting: Podiatry

## 2015-05-20 DIAGNOSIS — M79676 Pain in unspecified toe(s): Secondary | ICD-10-CM | POA: Diagnosis not present

## 2015-05-20 DIAGNOSIS — B351 Tinea unguium: Secondary | ICD-10-CM

## 2015-05-20 MED ORDER — INSULIN REGULAR HUMAN 100 UNIT/ML IJ SOLN: 30.0000 [IU] | Freq: Three times a day (TID) | INTRAMUSCULAR | Status: DC

## 2015-05-20 MED ORDER — SIMVASTATIN 40 MG PO TABS: ORAL_TABLET | ORAL | Status: AC

## 2015-05-20 MED ORDER — VITAMIN D (ERGOCALCIFEROL) 1.25 MG (50000 UNIT) PO CAPS: ORAL_CAPSULE | ORAL | Status: DC

## 2015-05-20 MED ORDER — "INSULIN SYRINGE-NEEDLE U-100 30G X 5/16"" 0.5 ML MISC": Status: DC

## 2015-05-20 MED ORDER — INSULIN NPH (HUMAN) (ISOPHANE) 100 UNIT/ML ~~LOC~~ SUSP: SUBCUTANEOUS | Status: DC

## 2015-05-20 NOTE — Patient Instructions (Signed)
Diabetes and Foot Care Diabetes may cause you to have problems because of poor blood supply (circulation) to your feet and legs. This may cause the skin on your feet to become thinner, break easier, and heal more slowly. Your skin may become dry, and the skin may peel and crack. You may also have nerve damage in your legs and feet causing decreased feeling in them. You may not notice minor injuries to your feet that could lead to infections or more serious problems. Taking care of your feet is one of the most important things you can do for yourself.  HOME CARE INSTRUCTIONS  Wear shoes at all times, even in the house. Do not go barefoot. Bare feet are easily injured.  Check your feet daily for blisters, cuts, and redness. If you cannot see the bottom of your feet, use a mirror or ask someone for help.  Wash your feet with warm water (do not use hot water) and mild soap. Then pat your feet and the areas between your toes until they are completely dry. Do not soak your feet as this can dry your skin.  Apply a moisturizing lotion or petroleum jelly (that does not contain alcohol and is unscented) to the skin on your feet and to dry, brittle toenails. Do not apply lotion between your toes.  Trim your toenails straight across. Do not dig under them or around the cuticle. File the edges of your nails with an emery board or nail file.  Do not cut corns or calluses or try to remove them with medicine.  Wear clean socks or stockings every day. Make sure they are not too tight. Do not wear knee-high stockings since they may decrease blood flow to your legs.  Wear shoes that fit properly and have enough cushioning. To break in new shoes, wear them for just a few hours a day. This prevents you from injuring your feet. Always look in your shoes before you put them on to be sure there are no objects inside.  Do not cross your legs. This may decrease the blood flow to your feet.  If you find a minor scrape,  cut, or break in the skin on your feet, keep it and the skin around it clean and dry. These areas may be cleansed with mild soap and water. Do not cleanse the area with peroxide, alcohol, or iodine.  When you remove an adhesive bandage, be sure not to damage the skin around it.  If you have a wound, look at it several times a day to make sure it is healing.  Do not use heating pads or hot water bottles. They may burn your skin. If you have lost feeling in your feet or legs, you may not know it is happening until it is too late.  Make sure your health care provider performs a complete foot exam at least annually or more often if you have foot problems. Report any cuts, sores, or bruises to your health care provider immediately. SEEK MEDICAL CARE IF:   You have an injury that is not healing.  You have cuts or breaks in the skin.  You have an ingrown nail.  You notice redness on your legs or feet.  You feel burning or tingling in your legs or feet.  You have pain or cramps in your legs and feet.  Your legs or feet are numb.  Your feet always feel cold. SEEK IMMEDIATE MEDICAL CARE IF:   There is increasing redness,   swelling, or pain in or around a wound.  There is a red line that goes up your leg.  Pus is coming from a wound.  You develop a fever or as directed by your health care provider.  You notice a bad smell coming from an ulcer or wound.   This information is not intended to replace advice given to you by your health care provider. Make sure you discuss any questions you have with your health care provider.   Document Released: 02/27/2000 Document Revised: 11/01/2012 Document Reviewed: 08/08/2012 Elsevier Interactive Patient Education 2016 [employer]  

## 2015-05-20 NOTE — Progress Notes (Signed)
Patient ID: Michael Cunningham, male   DOB: 1945-03-29, 70 y.o.   MRN: [id number]   Subjective: This patient presents today complaining of elongated and thickened toenails for term comfortable walking wearing shoes and requests nail debridement  Objective: No open skin lesions bilaterally The toenails are elongated, discolored, hypertrophic, deformed and tender to direct palpation 6-10  Assessment: Type II diabetic Symptomatic onychomycoses 6-10  Plan: Debridement toenails 6-10 mechanically an electrical without any bleeding  Reappoint 3 months

## 2015-06-12 ENCOUNTER — Encounter: Payer: Self-pay | Admitting: Family Medicine

## 2015-06-12 ENCOUNTER — Ambulatory Visit (INDEPENDENT_AMBULATORY_CARE_PROVIDER_SITE_OTHER): Payer: Medicare Other | Admitting: Family Medicine

## 2015-06-12 VITALS — BP 117/59 | HR 84 | Temp 98.3°F | Ht 70.0 in | Wt 252.2 lb

## 2015-06-12 DIAGNOSIS — I10 Essential (primary) hypertension: Secondary | ICD-10-CM | POA: Diagnosis not present

## 2015-06-12 DIAGNOSIS — E669 Obesity, unspecified: Secondary | ICD-10-CM

## 2015-06-12 DIAGNOSIS — E119 Type 2 diabetes mellitus without complications: Secondary | ICD-10-CM | POA: Diagnosis not present

## 2015-06-12 DIAGNOSIS — I2510 Atherosclerotic heart disease of native coronary artery without angina pectoris: Secondary | ICD-10-CM | POA: Diagnosis not present

## 2015-06-12 DIAGNOSIS — J0111 Acute recurrent frontal sinusitis: Secondary | ICD-10-CM

## 2015-06-12 DIAGNOSIS — E1169 Type 2 diabetes mellitus with other specified complication: Secondary | ICD-10-CM

## 2015-06-12 MED ORDER — AMOXICILLIN-POT CLAVULANATE 875-125 MG PO TABS: 1.0000 | ORAL_TABLET | Freq: Two times a day (BID) | ORAL | Status: DC

## 2015-06-12 MED ORDER — HYDROCOD POLST-CPM POLST ER 10-8 MG/5ML PO SUER: 5.0000 mL | Freq: Two times a day (BID) | ORAL | Status: DC | PRN

## 2015-06-12 MED ORDER — FLUTICASONE PROPIONATE 50 MCG/ACT NA SUSP: 2.0000 | Freq: Every day | NASAL | Status: DC

## 2015-06-12 NOTE — Assessment & Plan Note (Addendum)
Encouraged increased rest and hydration, add probiotics, zinc such as Coldeze or Xicam. Treat fevers as needed. Start Augmentin XR [id number] bid, mucinex bid. Flonase daily for next week

## 2015-06-12 NOTE — Assessment & Plan Note (Signed)
Well controlled, no changes to meds. Encouraged heart healthy diet such as the DASH diet and exercise as tolerated.  °

## 2015-06-12 NOTE — Progress Notes (Signed)
Subjective:    Patient ID: Michael Cunningham, male    DOB: 05-15-1945, 70 y.o.   MRN: [id number]  Chief Complaint  Patient presents with  . Fever    head pressure  . Chills  . Cough    HPI Patient is in today for fever and chills, has not been feeling well in last 5 weeks.  Patient reports he has body ache and is tired and has a non productive cough.  Had a Tmax of 100.6. Tylenol has helped to bring down the temp temporarily. He endorses some head and chest congesion as well as anorexia the past 2 days. Last week he was feeling better than he is now. Denies CP/palp/GI or GU c/o. Taking meds as prescribed  Past Medical History  Diagnosis Date  . Hypertension   . Multiple lipomas     hx. mulitple and some remains -arms, legs  . Diverticulosis of colon     LEFT SIDE  . History of small bowel obstruction     X2  28 YRS AGO AND LAST ONE 04/2014--  RESOLVED WITHOUT SURGICAL INTERVENTION  . Type 2 diabetes mellitus ([place])   . Left ureteral calculus   . Complication of anesthesia     11'12 Surgery with excrutiating "head about to explode" upon awakening  -lasted several hours postop  ( SURGERY DONE 08-13-2011 AT [facility] PT DID OK POST OP)  . Measles =  . Hyperlipidemia   . Recurrent kidney stones 07/08/2013    Follows with Dr Gaynelle Arabian He believes they are Calcium oxalate stones  . Obesity, unspecified 09/18/2013  . CAD (coronary artery disease)     a. 05/2014 [place] s/p DES x4 to mid-distLAD and DESx1 to DI  . Vitamin D deficiency 07/02/2014  . Thrombocytopenia ([place]) 07/07/2014    Past Surgical History  Procedure Laterality Date  . Lipoma excision  1975    multiple, 10-12  . Knee arthroscopy  08/13/2011    right ARTHROSCOPY KNEE;  Surgeon: Gearlean Alf, MD;  Location: [facility];  Service: Orthopedics;  Laterality: Right;  medial  meniscal debridement, excision of plica, synovectomy  . Knee arthroscopy w/ meniscectomy Right 01-29-2011    twice, first trimmed mediscus and cyst  . Colonoscopy w/  polypectomy  LAST ONE 2013  . Knee arthroscopy Left 11/16/2013    Procedure: LEFT KNEE ARTHROSCOPY MEDIAL MENISCAL DEBRIDEMENT, CHONDROPLASTY;  Surgeon: Gearlean Alf, MD;  Location: [facility];  Service: Orthopedics;  Laterality: Left;  . Coronary stent placement  05/17/2014    PROXIMAL X 4  MID & DISTAL LAD  DIAGONAL   . Left heart catheterization with coronary angiogram N/A 05/17/2014    Procedure: LEFT HEART CATHETERIZATION WITH CORONARY ANGIOGRAM;  Surgeon: Burnell Blanks, MD;  Location: [facility] CATH LAB;  Service: Cardiovascular;  Laterality: N/A;  . Percutaneous coronary stent intervention (pci-s)  05/17/2014    Procedure: PERCUTANEOUS CORONARY STENT INTERVENTION (PCI-S);  Surgeon: Burnell Blanks, MD;  Location: [facility] CATH LAB;  Service: Cardiovascular;;  Diag1 and Prox to Distal LAD    Family History  Problem Relation Age of Onset  . Hypertension Mother   . Cancer Mother 61    ovarian  . Heart disease Father     CHF  . Heart attack Father 59  . Cancer Sister     breast- just finished her last radiation  . Heart disease Maternal Grandfather     ?  [name] Heart attack Paternal Grandfather 26  . Diabetes Neg Hx   .  Heart attack Paternal Uncle 67    Sudden death    Social History   Social History  . Marital Status: Married    Spouse Name: N/A  . Number of Children: 2  . Years of Education: N/A   Occupational History  . Not on file.   Social History Main Topics  . Smoking status: Former Smoker -- 1.00 packs/day for 4 years    Types: Cigarettes    Quit date: 06/14/1967  . Smokeless tobacco: Never Used  . Alcohol Use: No  . Drug Use: No  . Sexual Activity: Yes     Comment: lives with wife   Other Topics Concern  . 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      Outpatient Prescriptions Prior to Visit  Medication Sig Dispense Refill  . ACCU-CHEK FASTCLIX LANCETS MISC Use to test blood sugar 2 times daily. Dx:  E11.40 204 each 3  . acetaminophen (TYLENOL) 500 MG tablet Take 1,000 mg by mouth every 6 (six) hours as needed for mild pain. Reported on 03/31/2015    . aspirin EC 81 MG tablet Take 81 mg by mouth daily with breakfast.     . benzonatate (TESSALON) 100 MG capsule Take 100 mg by mouth 3 (three) times daily as needed for cough.    . cholecalciferol (VITAMIN D) 1000 UNITS tablet Take 5,000 Units by mouth daily.     [name] glucose blood (ACCU-CHEK AVIVA PLUS) test strip Use to test blood sugar 2 times daily. Dx: E11.40 200 each 3  . insulin NPH Human (HUMULIN N) 100 UNIT/ML injection INJECT UNDER SKIN 20 UNITS IN THE AM AND 42-46 UNITS AT BEDTIME. 20 mL 3  . insulin regular (HUMULIN R) 100 units/mL injection Inject 0.3-0.35 mLs (30-35 Units total) into the skin 3 (three) times daily before meals. 30 mL 3  . Insulin Syringe-Needle U-100 (TRUEPLUS INSULIN SYRINGE) 30G X 5/16" 0.5 ML MISC USE TO INJECT INSULIN 5 TIMES DAILY. 200 each 3  . losartan (COZAAR) 50 MG tablet TAKE 1&1/2 (75 MG TOTAL) TABLETS BY MOUTH EVERY MORNING 180 tablet 3  . metoprolol tartrate (LOPRESSOR) 25 MG tablet Take 1 tablet (25 mg total) by mouth 2 (two) times daily. 180 tablet 3  . Multiple Vitamin (MULTIVITAMIN WITH MINERALS) TABS tablet Take 1 tablet by mouth daily.    . nitroGLYCERIN (NITROSTAT) 0.4 MG SL tablet Place 1 tablet (0.4 mg total) under the tongue every 5 (five) minutes as needed for chest pain. 90 tablet 3  . Polyethylene Glycol 3350 (MIRALAX PO) Take by mouth as needed. Reported on 03/31/2015    . prasugrel (EFFIENT) 10 MG TABS tablet Take 1 tablet (10 mg total) by mouth daily. 90 tablet 3  . simvastatin (ZOCOR) 40 MG tablet TAKE 1 TABLET (40 MG) BY MOUTH AT BEDTIME. 90 tablet 2  . Vitamin D, Ergocalciferol, (DRISDOL) 50000 units CAPS capsule TAKE 1 CAPSULE (50,000 UNITS) BY MOUTH EVERY 7 DAYS FOR 12 WEEKS 12 capsule 0   No facility-administered medications prior to visit.    Allergies  Allergen Reactions  .  Ciprofloxacin Other (See Comments)    Abdominal pain  . Lipitor [Atorvastatin Calcium] Other (See Comments)    Severe muscle pain  . Lisinopril Cough  . Metformin And Related Diarrhea    Review of Systems  Constitutional: Positive for fever, chills and malaise/fatigue.  HENT: Negative for congestion.   Eyes: Negative for blurred vision.  Respiratory:  Positive for cough (nonproductive). Negative for shortness of breath.   Cardiovascular: Negative for chest pain, palpitations and leg swelling.  Gastrointestinal: Negative for nausea, abdominal pain and blood in stool.  Genitourinary: Negative for dysuria and frequency.  Musculoskeletal: Positive for myalgias. Negative for falls.  Skin: Negative for rash.  Neurological: Negative for dizziness, loss of consciousness and headaches.  Endo/Heme/Allergies: Negative for environmental allergies.  Psychiatric/Behavioral: Negative for depression. The patient is not nervous/anxious.        Objective:    Physical Exam  Constitutional: He is oriented to person, place, and time. He appears well-developed and well-nourished. No distress.  HENT:  Head: Normocephalic and atraumatic.  TMs dull and retracted  Eyes: Conjunctivae are normal.  Neck: Neck supple. No thyromegaly present.  Cardiovascular: Normal rate, regular rhythm and normal heart sounds.   No murmur heard. Pulmonary/Chest: Effort normal and breath sounds normal. No respiratory distress. He has no wheezes.  Abdominal: Soft. Bowel sounds are normal. He exhibits no mass. There is no tenderness.  Musculoskeletal: He exhibits no edema.  Lymphadenopathy:    He has no cervical adenopathy.  Neurological: He is alert and oriented to person, place, and time.  Skin: Skin is warm and dry.  Psychiatric: He has a normal mood and affect. His behavior is normal.    BP 117/59 mmHg  Pulse 84  Temp(Src) 98.3 F (36.8 C) (Oral)  Ht 5\' 10"  (1.778 m)  Wt 252 lb 4 oz (114.42 kg)  BMI 36.19 kg/m2   SpO2 95% Wt Readings from Last 3 Encounters:  06/12/15 252 lb 4 oz (114.42 kg)  04/25/15 252 lb 11.2 oz (114.624 kg)  04/03/15 255 lb 9.6 oz (115.939 kg)     Lab Results  Component Value Date   WBC 6.2 03/26/2015   HGB 14.9 03/26/2015   HCT 43.7 03/26/2015   PLT 167.0 03/26/2015   GLUCOSE 172* 03/26/2015   CHOL 141 03/26/2015   TRIG 183.0* 03/26/2015   HDL 33.30* 03/26/2015   LDLDIRECT 108.0 04/16/2014   LDLCALC 71 03/26/2015   ALT 26 03/26/2015   AST 23 03/26/2015   NA 138 03/26/2015   K 4.3 03/26/2015   CL 103 03/26/2015   CREATININE 0.80 03/26/2015   BUN 15 03/26/2015   CO2 27 03/26/2015   TSH 1.84 03/26/2015   PSA 1.24 07/24/2014   INR 1.02 05/17/2014   HGBA1C 7.4* 03/26/2015   MICROALBUR 0.8 03/21/2014    Lab Results  Component Value Date   TSH 1.84 03/26/2015   Lab Results  Component Value Date   WBC 6.2 03/26/2015   HGB 14.9 03/26/2015   HCT 43.7 03/26/2015   MCV 92.1 03/26/2015   PLT 167.0 03/26/2015   Lab Results  Component Value Date   NA 138 03/26/2015   K 4.3 03/26/2015   CO2 27 03/26/2015   GLUCOSE 172* 03/26/2015   BUN 15 03/26/2015   CREATININE 0.80 03/26/2015   BILITOT 0.6 03/26/2015   ALKPHOS 82 03/26/2015   AST 23 03/26/2015   ALT 26 03/26/2015   PROT 6.7 03/26/2015   ALBUMIN 4.0 03/26/2015   CALCIUM 9.3 03/26/2015   ANIONGAP 8 05/18/2014   GFR 101.36 03/26/2015   Lab Results  Component Value Date   CHOL 141 03/26/2015   Lab Results  Component Value Date   HDL 33.30* 03/26/2015   Lab Results  Component Value Date   LDLCALC 71 03/26/2015   Lab Results  Component Value Date   TRIG 183.0* 03/26/2015  Lab Results  Component Value Date   CHOLHDL 4 03/26/2015   Lab Results  Component Value Date   HGBA1C 7.4* 03/26/2015       Assessment & Plan:   Problem List Items Addressed This Visit    Diabetes mellitus type 2 in obese ([place])    hgba1c acceptable, minimize simple carbs. Increase exercise as tolerated.  Continue current meds. No flares with acute illness      Hypertension (Chronic)    Well controlled, no changes to meds. Encouraged heart healthy diet such as the DASH diet and exercise as tolerated.       Obesity - Primary   Sinusitis, acute frontal    Encouraged increased rest and hydration, add probiotics, zinc such as Coldeze or Xicam. Treat fevers as needed. Start Augmentin XR 875 bid, mucinex bid. Flonase daily for next week      Relevant Medications   chlorpheniramine-HYDROcodone (TUSSIONEX PENNKINETIC ER) 10-8 MG/5ML SUER   amoxicillin-clavulanate (AUGMENTIN) 875-125 MG tablet   fluticasone (FLONASE) 50 MCG/ACT nasal spray      I am having Mr. Budzynski start on chlorpheniramine-HYDROcodone, amoxicillin-clavulanate, and fluticasone. I am also having him maintain his aspirin EC, multivitamin with minerals, acetaminophen, nitroGLYCERIN, cholecalciferol, Polyethylene Glycol 3350 (MIRALAX PO), benzonatate, prasugrel, losartan, metoprolol tartrate, ACCU-CHEK FASTCLIX LANCETS, glucose blood, Vitamin D (Ergocalciferol), simvastatin, insulin regular, insulin NPH Human, and Insulin Syringe-Needle U-100.  Meds ordered this encounter  Medications  . chlorpheniramine-HYDROcodone (TUSSIONEX PENNKINETIC ER) 10-8 MG/5ML SUER    Sig: Take 5 mLs by mouth every 12 (twelve) hours as needed for cough.    Dispense:  140 mL    Refill:  0  . amoxicillin-clavulanate (AUGMENTIN) 875-125 MG tablet    Sig: Take 1 tablet by mouth 2 (two) times daily. x10 days    Dispense:  20 tablet    Refill:  0  . fluticasone (FLONASE) 50 MCG/ACT nasal spray    Sig: Place 2 sprays into both nostrils daily.    Dispense:  16 g    Refill:  6     Penni Homans, MD

## 2015-06-12 NOTE — Assessment & Plan Note (Signed)
hgba1c acceptable, minimize simple carbs. Increase exercise as tolerated. Continue current meds. No flares with acute illness

## 2015-06-12 NOTE — Patient Instructions (Addendum)
NOW Probiotic Vitamin. Available at [employer].  Influenza, Adult Influenza ("the flu") is a viral infection of the respiratory tract. It occurs more often in winter months because people spend more time in close contact with one another. Influenza can make you feel very sick. Influenza easily spreads from person to person (contagious). CAUSES  Influenza is caused by a virus that infects the respiratory tract. You can catch the virus by breathing in droplets from an infected person's cough or sneeze. You can also catch the virus by touching something that was recently contaminated with the virus and then touching your mouth, nose, or eyes. RISKS AND COMPLICATIONS You may be at risk for a more severe case of influenza if you smoke cigarettes, have diabetes, have chronic heart disease (such as heart failure) or lung disease (such as asthma), or if you have a weakened immune system. Elderly people and pregnant women are also at risk for more serious infections. The most common problem of influenza is a lung infection (pneumonia). Sometimes, this problem can require emergency medical care and may be life threatening. SIGNS AND SYMPTOMS  Symptoms typically last 4 to 10 days and may include:  Fever.  Chills.  Headache, body aches, and muscle aches.  Sore throat.  Chest discomfort and cough.  Poor appetite.  Weakness or feeling tired.  Dizziness.  Nausea or vomiting. DIAGNOSIS  Diagnosis of influenza is often made based on your history and a physical exam. A nose or throat swab test can be done to confirm the diagnosis. TREATMENT  In mild cases, influenza goes away on its own. Treatment is directed at relieving symptoms. For more severe cases, your health care provider may prescribe antiviral medicines to shorten the sickness. Antibiotic medicines are not effective because the infection is caused by a virus, not by bacteria. HOME CARE INSTRUCTIONS  Take medicines only as  directed by your health care provider.  Use a cool mist humidifier to make breathing easier.  Get plenty of rest until your temperature returns to normal. This usually takes 3 to 4 days.  Drink enough fluid to keep your urine clear or pale yellow.  Cover yourmouth and nosewhen coughing or sneezing,and wash your handswellto prevent thevirusfrom spreading.  Stay homefromwork orschool untilthe fever is gonefor at least 67full day. PREVENTION  An annual influenza vaccination (flu shot) is the best way to avoid getting influenza. An annual flu shot is now routinely recommended for all adults in the [place] IF:  You experiencechest pain, yourcough worsens,or you producemore mucus.  Youhave nausea,vomiting, ordiarrhea.  Your fever returns or gets worse. SEEK IMMEDIATE MEDICAL CARE IF:  You havetrouble breathing, you become short of breath,or your skin ornails becomebluish.  You have severe painor stiffnessin the neck.  You develop a sudden headache, or pain in the face or ear.  You have nausea or vomiting that you cannot control. MAKE SURE YOU:   Understand these instructions.  Will watch your condition.  Will get help right away if you are not doing well or get worse.   This information is not intended to replace advice given to you by your health care provider. Make sure you discuss any questions you have with your health care provider.   Document Released: 02/27/2000 Document Revised: 03/22/2014 Document Reviewed: 05/31/2011 Elsevier Interactive Patient Education [employer].

## 2015-06-12 NOTE — Progress Notes (Signed)
Pre visit review using our clinic review tool, if applicable. No additional management support is needed unless otherwise documented below in the visit note. 

## 2015-06-17 ENCOUNTER — Ambulatory Visit (HOSPITAL_BASED_OUTPATIENT_CLINIC_OR_DEPARTMENT_OTHER): Payer: Medicare Other

## 2015-06-24 ENCOUNTER — Other Ambulatory Visit (INDEPENDENT_AMBULATORY_CARE_PROVIDER_SITE_OTHER): Payer: Medicare Other

## 2015-06-24 DIAGNOSIS — E559 Vitamin D deficiency, unspecified: Secondary | ICD-10-CM | POA: Diagnosis not present

## 2015-06-24 DIAGNOSIS — E1142 Type 2 diabetes mellitus with diabetic polyneuropathy: Secondary | ICD-10-CM

## 2015-06-24 DIAGNOSIS — I10 Essential (primary) hypertension: Secondary | ICD-10-CM

## 2015-06-24 DIAGNOSIS — E785 Hyperlipidemia, unspecified: Secondary | ICD-10-CM

## 2015-06-24 DIAGNOSIS — H3342 Traction detachment of retina, left eye: Secondary | ICD-10-CM | POA: Diagnosis not present

## 2015-06-24 DIAGNOSIS — M79609 Pain in unspecified limb: Secondary | ICD-10-CM | POA: Diagnosis not present

## 2015-06-24 DIAGNOSIS — M79643 Pain in unspecified hand: Secondary | ICD-10-CM

## 2015-06-24 DIAGNOSIS — E1165 Type 2 diabetes mellitus with hyperglycemia: Secondary | ICD-10-CM | POA: Diagnosis not present

## 2015-06-24 DIAGNOSIS — H35342 Macular cyst, hole, or pseudohole, left eye: Secondary | ICD-10-CM | POA: Diagnosis not present

## 2015-06-24 LAB — COMPREHENSIVE METABOLIC PANEL
ALBUMIN: 4 g/dL (ref 3.5–5.2)
ALT: 29 U/L (ref 0–53)
AST: 23 U/L (ref 0–37)
Alkaline Phosphatase: 72 U/L (ref 39–117)
BUN: 17 mg/dL (ref 6–23)
CALCIUM: 9.2 mg/dL (ref 8.4–10.5)
CO2: 25 mEq/L (ref 19–32)
CREATININE: 0.8 mg/dL (ref 0.40–1.50)
Chloride: 106 mEq/L (ref 96–112)
GFR: 101.29 mL/min (ref >60.00)
Glucose, Bld: 146 mg/dL — ABNORMAL HIGH (ref 70–99)
Potassium: 4.1 mEq/L (ref 3.5–5.1)
Sodium: 140 mEq/L (ref 135–145)
TOTAL PROTEIN: 7 g/dL (ref 6.0–8.3)
Total Bilirubin: 0.8 mg/dL (ref 0.2–1.2)

## 2015-06-24 LAB — LIPID PANEL
Cholesterol: 138 mg/dL (ref 0–200)
HDL: 30.9 mg/dL — ABNORMAL LOW (ref >39.00)
LDL Cholesterol: 77 mg/dL (ref 0–99)
NONHDL: 106.94
Total CHOL/HDL Ratio: 4
Triglycerides: 152 mg/dL — ABNORMAL HIGH (ref 0.0–149.0)
VLDL: 30.4 mg/dL (ref 0.0–40.0)

## 2015-06-24 LAB — CBC
HEMATOCRIT: 41.2 % (ref 39.0–52.0)
Hemoglobin: 14 g/dL (ref 13.0–17.0)
MCHC: 34 g/dL (ref 30.0–36.0)
MCV: 89.9 fl (ref 78.0–100.0)
Platelets: 202 10*3/uL (ref 150.0–400.0)
RBC: 4.58 Mil/uL (ref 4.22–5.81)
RDW: 13.7 % (ref 11.5–15.5)
WBC: 5.1 10*3/uL (ref 4.0–10.5)

## 2015-06-24 LAB — MICROALBUMIN / CREATININE URINE RATIO
Creatinine,U: 201.9 mg/dL
MICROALB/CREAT RATIO: 0.8 mg/g (ref 0.0–30.0)
Microalb, Ur: 1.7 mg/dL (ref 0.0–1.9)

## 2015-06-24 LAB — VITAMIN D 25 HYDROXY (VIT D DEFICIENCY, FRACTURES): VITD: 43.41 ng/mL (ref 30.00–100.00)

## 2015-06-24 LAB — TSH: TSH: 1.55 u[IU]/mL (ref 0.35–4.50)

## 2015-06-24 LAB — RHEUMATOID FACTOR

## 2015-06-24 LAB — HEMOGLOBIN A1C: HEMOGLOBIN A1C: 7.6 % — AB (ref 4.6–6.5)

## 2015-07-01 ENCOUNTER — Ambulatory Visit (INDEPENDENT_AMBULATORY_CARE_PROVIDER_SITE_OTHER): Payer: Medicare Other | Admitting: Family Medicine

## 2015-07-01 ENCOUNTER — Encounter: Payer: Self-pay | Admitting: Family Medicine

## 2015-07-01 VITALS — BP 110/62 | HR 58 | Temp 98.0°F | Ht 70.0 in | Wt 252.2 lb

## 2015-07-01 DIAGNOSIS — E119 Type 2 diabetes mellitus without complications: Secondary | ICD-10-CM | POA: Diagnosis not present

## 2015-07-01 DIAGNOSIS — I10 Essential (primary) hypertension: Secondary | ICD-10-CM

## 2015-07-01 DIAGNOSIS — E559 Vitamin D deficiency, unspecified: Secondary | ICD-10-CM

## 2015-07-01 DIAGNOSIS — E669 Obesity, unspecified: Secondary | ICD-10-CM

## 2015-07-01 DIAGNOSIS — I2510 Atherosclerotic heart disease of native coronary artery without angina pectoris: Secondary | ICD-10-CM

## 2015-07-01 DIAGNOSIS — M25561 Pain in right knee: Secondary | ICD-10-CM

## 2015-07-01 DIAGNOSIS — E785 Hyperlipidemia, unspecified: Secondary | ICD-10-CM | POA: Diagnosis not present

## 2015-07-01 DIAGNOSIS — M25562 Pain in left knee: Secondary | ICD-10-CM

## 2015-07-01 DIAGNOSIS — E1169 Type 2 diabetes mellitus with other specified complication: Secondary | ICD-10-CM

## 2015-07-01 MED ORDER — VITAMIN D (ERGOCALCIFEROL) 1.25 MG (50000 UNIT) PO CAPS: ORAL_CAPSULE | ORAL | Status: DC

## 2015-07-01 NOTE — Progress Notes (Signed)
Pre visit review using our clinic review tool, if applicable. No additional management support is needed unless otherwise documented below in the visit note. 

## 2015-07-01 NOTE — Progress Notes (Signed)
Subjective:    Patient ID: Michael Cunningham, male    DOB: [birth year], 70 y.o.   MRN: [id number]  Chief Complaint  Patient presents with  . Follow-up    HPI Patient is in today for follow up.  Patients blood sugars have been running in the 80s when low an 170's at its highest.  Patient reports he does feel when his sugars drop to the 80's. Denies polyuria or polydipsia. Feels well. No recent illness or acute concerns. Does note some bruising on abdomen from insulin but no pain. Notes some knee pain on left which is limiting his activity. No recent trauma. Denies CP/palp/SOB/HA/congestion/fevers/GI or GU c/o. Taking meds as prescribed  Past Medical History  Diagnosis Date  . Hypertension   . Multiple lipomas     hx. mulitple and some remains -arms, legs  . Diverticulosis of colon     LEFT SIDE  . History of small bowel obstruction     X2  28 YRS AGO AND LAST ONE 04/2014--  RESOLVED WITHOUT SURGICAL INTERVENTION  . Type 2 diabetes mellitus ([place])   . Left ureteral calculus   . Complication of anesthesia     11'12 Surgery with excrutiating "head about to explode" upon awakening  -lasted several hours postop  ( SURGERY DONE 08-13-2011 AT [facility] PT DID OK POST OP)  . Measles =  . Hyperlipidemia   . Recurrent kidney stones 07/08/2013    Follows with Dr Gaynelle Arabian He believes they are Calcium oxalate stones  . Obesity, unspecified 09/18/2013  . CAD (coronary artery disease)     a. 05/2014 [place] s/p DES x4 to mid-distLAD and DESx1 to DI  . Vitamin D deficiency 07/02/2014  . Thrombocytopenia ([place]) 07/07/2014  . Knee pain, bilateral 08/13/2011    Past Surgical History  Procedure Laterality Date  . Lipoma excision  1975    multiple, 10-12  . Knee arthroscopy  08/13/2011    right ARTHROSCOPY KNEE;  Surgeon: Gearlean Alf, MD;  Location: [facility];  Service: Orthopedics;  Laterality: Right;  medial  meniscal debridement, excision of plica, synovectomy  . Knee arthroscopy w/ meniscectomy Right 01-29-2011      twice, first trimmed mediscus and cyst  . Colonoscopy w/ polypectomy  LAST ONE 2013  . Knee arthroscopy Left 11/16/2013    Procedure: LEFT KNEE ARTHROSCOPY MEDIAL MENISCAL DEBRIDEMENT, CHONDROPLASTY;  Surgeon: Gearlean Alf, MD;  Location: [facility];  Service: Orthopedics;  Laterality: Left;  . Coronary stent placement  05/17/2014    PROXIMAL X 4  MID & DISTAL LAD  DIAGONAL   . Left heart catheterization with coronary angiogram N/A 05/17/2014    Procedure: LEFT HEART CATHETERIZATION WITH CORONARY ANGIOGRAM;  Surgeon: Burnell Blanks, MD;  Location: [facility] CATH LAB;  Service: Cardiovascular;  Laterality: N/A;  . Percutaneous coronary stent intervention (pci-s)  05/17/2014    Procedure: PERCUTANEOUS CORONARY STENT INTERVENTION (PCI-S);  Surgeon: Burnell Blanks, MD;  Location: [facility] CATH LAB;  Service: Cardiovascular;;  Diag1 and Prox to Distal LAD    Family History  Problem Relation Age of Onset  . Hypertension Mother   . Cancer Mother 74    ovarian  . Heart disease Father     CHF  . Heart attack Father 49  . Cancer Sister     breast- just finished her last radiation  . Heart disease Maternal Grandfather     ?  [name] Heart attack Paternal Grandfather 34  . Diabetes Neg Hx   .  Heart attack Paternal Uncle 82    Sudden death    Social History   Social History  . Marital Status: Married    Spouse Name: N/A  . Number of Children: 2  . Years of Education: N/A   Occupational History  . Not on file.   Social History Main Topics  . Smoking status: Former Smoker -- 1.00 packs/day for 4 years    Types: Cigarettes    Quit date: 06/14/1967  . Smokeless tobacco: Never Used  . Alcohol Use: No  . Drug Use: No  . Sexual Activity: Yes     Comment: lives with wife   Other Topics Concern  . 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      Outpatient Prescriptions Prior to Visit  Medication Sig Dispense Refill  . ACCU-CHEK FASTCLIX  LANCETS MISC Use to test blood sugar 2 times daily. Dx: E11.40 204 each 3  . acetaminophen (TYLENOL) 500 MG tablet Take 1,000 mg by mouth every 6 (six) hours as needed for mild pain. Reported on 03/31/2015    . aspirin EC 81 MG tablet Take 81 mg by mouth daily with breakfast.     . cholecalciferol (VITAMIN D) 1000 UNITS tablet Take 5,000 Units by mouth daily.     [name] glucose blood (ACCU-CHEK AVIVA PLUS) test strip Use to test blood sugar 2 times daily. Dx: E11.40 200 each 3  . insulin NPH Human (HUMULIN N) 100 UNIT/ML injection INJECT UNDER SKIN 20 UNITS IN THE AM AND 42-46 UNITS AT BEDTIME. 20 mL 3  . insulin regular (HUMULIN R) 100 units/mL injection Inject 0.3-0.35 mLs (30-35 Units total) into the skin 3 (three) times daily before meals. 30 mL 3  . Insulin Syringe-Needle U-100 (TRUEPLUS INSULIN SYRINGE) 30G X 5/16" 0.5 ML MISC USE TO INJECT INSULIN 5 TIMES DAILY. 200 each 3  . losartan (COZAAR) 50 MG tablet TAKE 1&1/2 (75 MG TOTAL) TABLETS BY MOUTH EVERY MORNING 180 tablet 3  . metoprolol tartrate (LOPRESSOR) 25 MG tablet Take 1 tablet (25 mg total) by mouth 2 (two) times daily. 180 tablet 3  . Multiple Vitamin (MULTIVITAMIN WITH MINERALS) TABS tablet Take 1 tablet by mouth daily.    . nitroGLYCERIN (NITROSTAT) 0.4 MG SL tablet Place 1 tablet (0.4 mg total) under the tongue every 5 (five) minutes as needed for chest pain. 90 tablet 3  . Polyethylene Glycol 3350 (MIRALAX PO) Take by mouth as needed. Reported on 03/31/2015    . prasugrel (EFFIENT) 10 MG TABS tablet Take 1 tablet (10 mg total) by mouth daily. 90 tablet 3  . simvastatin (ZOCOR) 40 MG tablet TAKE 1 TABLET (40 MG) BY MOUTH AT BEDTIME. 90 tablet 2  . Vitamin D, Ergocalciferol, (DRISDOL) 50000 units CAPS capsule TAKE 1 CAPSULE (50,000 UNITS) BY MOUTH EVERY 7 DAYS FOR 12 WEEKS 12 capsule 0  . amoxicillin-clavulanate (AUGMENTIN) 875-125 MG tablet Take 1 tablet by mouth 2 (two) times daily. x10 days 20 tablet 0  . benzonatate (TESSALON) 100 MG  capsule Take 100 mg by mouth 3 (three) times daily as needed for cough.    . chlorpheniramine-HYDROcodone (TUSSIONEX PENNKINETIC ER) 10-8 MG/5ML SUER Take 5 mLs by mouth every 12 (twelve) hours as needed for cough. 140 mL 0  . fluticasone (FLONASE) 50 MCG/ACT nasal spray Place 2 sprays into both nostrils daily. 16 g 6   No facility-administered medications prior to visit.    Allergies  Allergen  Reactions  . Ciprofloxacin Other (See Comments)    Abdominal pain  . Lipitor [Atorvastatin Calcium] Other (See Comments)    Severe muscle pain  . Lisinopril Cough  . Metformin And Related Diarrhea    Review of Systems  Constitutional: Negative for fever and malaise/fatigue.  HENT: Negative for congestion.   Eyes: Negative for blurred vision.  Respiratory: Negative for shortness of breath.   Cardiovascular: Negative for chest pain, palpitations and leg swelling.  Gastrointestinal: Negative for nausea, abdominal pain and blood in stool.  Genitourinary: Negative for dysuria and frequency.  Musculoskeletal: Positive for joint pain. Negative for falls.  Skin: Negative for rash.  Neurological: Negative for dizziness, loss of consciousness and headaches.  Endo/Heme/Allergies: Negative for environmental allergies.  Psychiatric/Behavioral: Negative for depression. The patient is not nervous/anxious.        Objective:    Physical Exam  Constitutional: He is oriented to person, place, and time. He appears well-developed and well-nourished. No distress.  HENT:  Head: Normocephalic and atraumatic.  Eyes: Conjunctivae are normal.  Neck: Neck supple. No thyromegaly present.  Cardiovascular: Normal rate, regular rhythm and normal heart sounds.   No murmur heard. Pulmonary/Chest: Effort normal and breath sounds normal. No respiratory distress. He has no wheezes.  Abdominal: Soft. Bowel sounds are normal. He exhibits no mass. There is no tenderness.  Musculoskeletal: He exhibits no edema.    Lymphadenopathy:    He has no cervical adenopathy.  Neurological: He is alert and oriented to person, place, and time.  Skin: Skin is warm and dry.  Yellowish bruising on abdomen in several spots secondary to insulin injections  Psychiatric: He has a normal mood and affect. His behavior is normal.    BP 110/62 mmHg  Pulse 58  Temp(Src) 98 F (36.7 C) (Oral)  Ht 5\' 10"  (1.778 m)  Wt 252 lb 4 oz (114.42 kg)  BMI 36.19 kg/m2  SpO2 98% Wt Readings from Last 3 Encounters:  07/01/15 252 lb 4 oz (114.42 kg)  06/12/15 252 lb 4 oz (114.42 kg)  04/25/15 252 lb 11.2 oz (114.624 kg)     Lab Results  Component Value Date   WBC 5.1 06/24/2015   HGB 14.0 06/24/2015   HCT 41.2 06/24/2015   PLT 202.0 06/24/2015   GLUCOSE 146* 06/24/2015   CHOL 138 06/24/2015   TRIG 152.0* 06/24/2015   HDL 30.90* 06/24/2015   LDLDIRECT 108.0 04/16/2014   LDLCALC 77 06/24/2015   ALT 29 06/24/2015   AST 23 06/24/2015   NA 140 06/24/2015   K 4.1 06/24/2015   CL 106 06/24/2015   CREATININE 0.80 06/24/2015   BUN 17 06/24/2015   CO2 25 06/24/2015   TSH 1.55 06/24/2015   PSA 1.24 07/24/2014   INR 1.02 05/17/2014   HGBA1C 7.6* 06/24/2015   MICROALBUR 1.7 06/24/2015    Lab Results  Component Value Date   TSH 1.55 06/24/2015   Lab Results  Component Value Date   WBC 5.1 06/24/2015   HGB 14.0 06/24/2015   HCT 41.2 06/24/2015   MCV 89.9 06/24/2015   PLT 202.0 06/24/2015   Lab Results  Component Value Date   NA 140 06/24/2015   K 4.1 06/24/2015   CO2 25 06/24/2015   GLUCOSE 146* 06/24/2015   BUN 17 06/24/2015   CREATININE 0.80 06/24/2015   BILITOT 0.8 06/24/2015   ALKPHOS 72 06/24/2015   AST 23 06/24/2015   ALT 29 06/24/2015   PROT 7.0 06/24/2015   ALBUMIN 4.0 06/24/2015  CALCIUM 9.2 06/24/2015   ANIONGAP 8 05/18/2014   GFR 101.29 06/24/2015   Lab Results  Component Value Date   CHOL 138 06/24/2015   Lab Results  Component Value Date   HDL 30.90* 06/24/2015   Lab Results   Component Value Date   LDLCALC 77 06/24/2015   Lab Results  Component Value Date   TRIG 152.0* 06/24/2015   Lab Results  Component Value Date   CHOLHDL 4 06/24/2015   Lab Results  Component Value Date   HGBA1C 7.6* 06/24/2015       Assessment & Plan:   Problem List Items Addressed This Visit    Diabetes mellitus type 2 in obese ([place])    hgba1c acceptable but up some, minimize simple carbs. Increase exercise as tolerated. Continue current meds for now using Insulin N 20 units in am and 42 units qhs then R 20 units in am and 35 units with lunch and dinner. Is now following with endocrinology and has an appt later this week.       Relevant Orders   CBC   TSH   Lipid panel   Comprehensive metabolic panel   Hemoglobin A1c   VITAMIN D 25 Hydroxy (Vit-D Deficiency, Fractures)   Hyperlipidemia    Tolerating statin, encouraged heart healthy diet, avoid trans fats, minimize simple carbs and saturated fats. Increase exercise as tolerated      Relevant Orders   CBC   TSH   Lipid panel   Comprehensive metabolic panel   Hemoglobin A1c   VITAMIN D 25 Hydroxy (Vit-D Deficiency, Fractures)   Hypertension - Primary (Chronic)    hgba1c acceptable, minimize simple carbs. Increase exercise as tolerated. Continue current meds      Relevant Orders   CBC   TSH   Lipid panel   Comprehensive metabolic panel   Hemoglobin A1c   VITAMIN D 25 Hydroxy (Vit-D Deficiency, Fractures)   Knee pain, bilateral    Left is acting up. Encouraged to try topical Salon [place] products and if persists may need to visit his orthopaedist, Dr Maureen Ralphs for further consideration      Vitamin D deficiency    Well treated, no changes to therapy today, continue 50000 Iu weekly and 5000 IU other days      Relevant Orders   CBC   TSH   Lipid panel   Comprehensive metabolic panel   Hemoglobin A1c   VITAMIN D 25 Hydroxy (Vit-D Deficiency, Fractures)      I have discontinued Michael Cunningham's benzonatate,  chlorpheniramine-HYDROcodone, amoxicillin-clavulanate, and fluticasone. I am also having him maintain his aspirin EC, multivitamin with minerals, acetaminophen, nitroGLYCERIN, cholecalciferol, Polyethylene Glycol 3350 (MIRALAX PO), prasugrel, losartan, metoprolol tartrate, ACCU-CHEK FASTCLIX LANCETS, glucose blood, simvastatin, insulin regular, insulin NPH Human, Insulin Syringe-Needle U-100, and Vitamin D (Ergocalciferol).  Meds ordered this encounter  Medications  . Vitamin D, Ergocalciferol, (DRISDOL) 50000 units CAPS capsule    Sig: TAKE 1 CAPSULE (50,000 UNITS) BY MOUTH EVERY 7 DAYS FOR 12 WEEKS    Dispense:  12 capsule    Refill:  0     Penni Homans, MD

## 2015-07-01 NOTE — Assessment & Plan Note (Signed)
Left is acting up. Encouraged to try topical Salon Pas products and if persists may need to visit his orthopaedist, Dr Maureen Ralphs for further consideration

## 2015-07-01 NOTE — Patient Instructions (Addendum)
Soak feet in half distilled vineger and half hot water and use lamisol cream daily.  Basic Carbohydrate Counting for Diabetes Mellitus Carbohydrate counting is a method for keeping track of the amount of carbohydrates you eat. Eating carbohydrates naturally increases the level of sugar (glucose) in your blood, so it is important for you to know the amount that is okay for you to have in every meal. Carbohydrate counting helps keep the level of glucose in your blood within normal limits. The amount of carbohydrates allowed is different for every person. A dietitian can help you calculate the amount that is right for you. Once you know the amount of carbohydrates you can have, you can count the carbohydrates in the foods you want to eat. Carbohydrates are found in the following foods:  Grains, such as breads and cereals.  Dried beans and soy products.  Starchy vegetables, such as potatoes, peas, and corn.  Fruit and fruit juices.  Milk and yogurt.  Sweets and snack foods, such as cake, cookies, candy, chips, soft drinks, and fruit drinks. CARBOHYDRATE COUNTING There are two ways to count the carbohydrates in your food. You can use either of the methods or a combination of both. Reading the "Nutrition Facts" on [place] The "Nutrition Facts" is an area that is included on the labels of almost all packaged food and beverages in the [place]. It includes the serving size of that food or beverage and information about the nutrients in each serving of the food, including the grams (g) of carbohydrate per serving.  Decide the number of servings of this food or beverage that you will be able to eat or drink. Multiply that number of servings by the number of grams of carbohydrate that is listed on the label for that serving. The total will be the amount of carbohydrates you will be having when you eat or drink this food or beverage. Learning Standard Serving Sizes of Food When you eat food  that is not packaged or does not include "Nutrition Facts" on the label, you need to measure the servings in order to count the amount of carbohydrates.A serving of most carbohydrate-rich foods contains about 15 g of carbohydrates. The following list includes serving sizes of carbohydrate-rich foods that provide 15 g ofcarbohydrate per serving:   1 slice of bread (1 oz) or 1 six-inch tortilla.    of a hamburger bun or English muffin.  4-6 crackers.   cup unsweetened dry cereal.    cup hot cereal.   cup rice or pasta.    cup mashed potatoes or  of a large baked potato.  1 cup fresh fruit or one small piece of fruit.    cup canned or frozen fruit or fruit juice.  1 cup milk.   cup plain fat-free yogurt or yogurt sweetened with artificial sweeteners.   cup cooked dried beans or starchy vegetable, such as peas, corn, or potatoes.  Decide the number of standard-size servings that you will eat. Multiply that number of servings by 15 (the grams of carbohydrates in that serving). For example, if you eat 2 cups of strawberries, you will have eaten 2 servings and 30 g of carbohydrates (2 servings x 15 g = 30 g). For foods such as soups and casseroles, in which more than one food is mixed in, you will need to count the carbohydrates in each food that is included. EXAMPLE OF CARBOHYDRATE COUNTING Sample Dinner  3 oz chicken breast.   cup of brown  rice.   cup of corn.  1 cup milk.   1 cup strawberries with sugar-free whipped topping.  Carbohydrate Calculation Step 1: Identify the foods that contain carbohydrates:   Rice.   Corn.   Milk.   Strawberries. Step 2:Calculate the number of servings eaten of each:   2 servings of rice.   1 serving of corn.   1 serving of milk.   1 serving of strawberries. Step 3: Multiply each of those number of servings by 15 g:   2 servings of rice x 15 g = 30 g.   1 serving of corn x 15 g = 15 g.   1 serving  of milk x 15 g = 15 g.   1 serving of strawberries x 15 g = 15 g. Step 4: Add together all of the amounts to find the total grams of carbohydrates eaten: 30 g + 15 g + 15 g + 15 g = 75 g.   This information is not intended to replace advice given to you by your health care provider. Make sure you discuss any questions you have with your health care provider.   Document Released: 03/01/2005 Document Revised: 03/22/2014 Document Reviewed: 01/26/2013 Elsevier Interactive Patient Education [employer].

## 2015-07-01 NOTE — Assessment & Plan Note (Signed)
Well treated, no changes to therapy today, continue 50000 Iu weekly and 5000 IU other days

## 2015-07-01 NOTE — Assessment & Plan Note (Signed)
Tolerating statin, encouraged heart healthy diet, avoid trans fats, minimize simple carbs and saturated fats. Increase exercise as tolerated 

## 2015-07-01 NOTE — Assessment & Plan Note (Signed)
hgba1c acceptable but up some, minimize simple carbs. Increase exercise as tolerated. Continue current meds for now using Insulin N 20 units in am and 42 units qhs then R 20 units in am and 35 units with lunch and dinner. Is now following with endocrinology and has an appt later this week.

## 2015-07-01 NOTE — Assessment & Plan Note (Signed)
hgba1c acceptable, minimize simple carbs. Increase exercise as tolerated. Continue current meds 

## 2015-07-02 ENCOUNTER — Ambulatory Visit (INDEPENDENT_AMBULATORY_CARE_PROVIDER_SITE_OTHER): Payer: Medicare Other | Admitting: Internal Medicine

## 2015-07-02 ENCOUNTER — Encounter: Payer: Self-pay | Admitting: Internal Medicine

## 2015-07-02 VITALS — BP 118/78 | HR 71 | Temp 98.0°F | Resp 12 | Wt 252.0 lb

## 2015-07-02 DIAGNOSIS — E1165 Type 2 diabetes mellitus with hyperglycemia: Secondary | ICD-10-CM | POA: Diagnosis not present

## 2015-07-02 DIAGNOSIS — R0683 Snoring: Secondary | ICD-10-CM | POA: Diagnosis not present

## 2015-07-02 DIAGNOSIS — E1159 Type 2 diabetes mellitus with other circulatory complications: Secondary | ICD-10-CM

## 2015-07-02 DIAGNOSIS — I2510 Atherosclerotic heart disease of native coronary artery without angina pectoris: Secondary | ICD-10-CM

## 2015-07-02 NOTE — Patient Instructions (Signed)
Please continue:  Insulin Before breakfast Before lunch Before dinner Bedtime  Regular 30  35 35    NPH 20   42   Try to work on your diet.  Please come back for a follow-up appointment in 3 months.

## 2015-07-02 NOTE — Progress Notes (Signed)
Patient ID: Michael Cunningham, male   DOB: Apr 13, 1945, 70 y.o.   MRN: [id number]  HPI: Michael Cunningham is a 70 y.o.-year-old male, returning for f/u DM2, dx in 1999, insulin-dependent since ~2008, uncontrolled, with complications (CAD, peripheral neuropathy, DR). Last visit 3 mo ago.  He had a URI 3-4 weeks ago >> ABx. Resolved now.  Since last visit >> more dietary indiscretions.   Last hemoglobin A1c was  higher: Lab Results  Component Value Date   HGBA1C 7.6* 06/24/2015   HGBA1C 7.4* 03/26/2015   HGBA1C 7.2* 12/19/2014   Pt is on a regimen of:  Insulin Before breakfast Before lunch Before dinner Bedtime  Regular 30  35 35    NPH 20   38 >> 42   Sliding scale of R insulin (SSI) - not using: - 150-175: + 1 unit  - 176-200: + 2 units  - 201-225: + 3 units  - 226-250: + 4 units  - 251-275: + 5 units - 276-300: + 6 units  Please do not use more than 20 units of R insulin before a meal prior to exercise (rehab, working in the yard, etc.)  He cannot afford analog insulins. He tried Metformin R and XR >> diarrhea He tried Januvia.  Pt checks his sugars 1-2x a day, rotating checks - reviewed his detailed log: - am: 90-170 >> 131-190 >> 143-205, 222 >> 120-199 >> 98, 113-167, 208 - 2h after b'fast: n/c >> 177 >> n.c - before lunch: 70-110 >> 87-168, 250 >> 84, 88 >> 82 >> 78, 111-165, 193 - 2h after lunch: n/c >> 76 >> 70 >> 87 - before dinner: 100-150 >> 73, 89-129, 207 >> 70 >> 72 >> 122-152 - 2h after dinner: n/c - bedtime: 100-160 >> 137 >> n/c >> 73, 141-186 >> 135-167, 183 - nighttime: lows >> wake him up >> corrects them (cookie) >> 90 >> n/c + fewer lows - Lowest sugar was 70 >> 70 >> 87; he has hypoglycemia awareness at 90.  Highest sugar was 270 >> 250 >> 222 >> 206 >> 208  Glucometer: AccuChek  Pt's meals are: - Breakfast: cheerios + skim milk + banana + strawberries - Lunch: sandwich or leftover  - Dinner: meat + veggies + starch - Snacks: PB crackers; wheat  thins, apple or grapes, pear   - no CKD, last BUN/creatinine:  Lab Results  Component Value Date   BUN 17 06/24/2015   CREATININE 0.80 06/24/2015   on losartan - last set of lipids: Lab Results  Component Value Date   CHOL 138 06/24/2015   HDL 30.90* 06/24/2015   LDLCALC 77 06/24/2015   LDLDIRECT 108.0 04/16/2014   TRIG 152.0* 06/24/2015   CHOLHDL 4 06/24/2015   on simvastatin - last eye exam was in 11/26/2014. + DR (mild). + angle-closure glaucoma >> had surgery in both eyes >> had blurry vision L eye >> resolved.  - no numbness and tingling in his L foot. He had a foot exam 12/2014 (dorsum L hallux + fungal inf) - [facility] (Dr Amalia Hailey). Very mild PN in R foot.   He also has a history of hypertension and hyperlipidemia.  ROS: Constitutional: no weight gain/loss, no fatigue, no subjective hyperthermia/hypothermia Eyes: + blurry vision, no xerophthalmia ENT: no sore throat, no nodules palpated in throat, no dysphagia/odynophagia, no hoarseness, + tinnitus Cardiovascular: no CP/SOB/palpitations/leg swelling Respiratory: no cough/SOB Gastrointestinal: no N/V/D/C Musculoskeletal: no muscle/joint aches Skin: no rashes Neurological: no tremors/numbness/tingling/dizziness  I reviewed  pt's medications, allergies, PMH, social hx, family hx, and changes were documented in the history of present illness. Otherwise, unchanged from my initial visit note:  Past Medical History  Diagnosis Date  . Hypertension   . Multiple lipomas     hx. mulitple and some remains -arms, legs  . Diverticulosis of colon     LEFT SIDE  . History of small bowel obstruction     X2  28 YRS AGO AND LAST ONE 04/2014--  RESOLVED WITHOUT SURGICAL INTERVENTION  . Type 2 diabetes mellitus ([place])   . Left ureteral calculus   . Complication of anesthesia     11'12 Surgery with excrutiating "head about to explode" upon awakening  -lasted several hours postop  ( SURGERY DONE 08-13-2011 AT [facility] PT DID OK  POST OP)  . Measles =  . Hyperlipidemia   . Recurrent kidney stones 07/08/2013    Follows with Dr Gaynelle Arabian He believes they are Calcium oxalate stones  . Obesity, unspecified 09/18/2013  . CAD (coronary artery disease)     a. 05/2014 [place] s/p DES x4 to mid-distLAD and DESx1 to DI  . Vitamin D deficiency 07/02/2014  . Thrombocytopenia ([place]) 07/07/2014  . Knee pain, bilateral 08/13/2011   Past Surgical History  Procedure Laterality Date  . Lipoma excision  1975    multiple, 10-12  . Knee arthroscopy  08/13/2011    right ARTHROSCOPY KNEE;  Surgeon: Gearlean Alf, MD;  Location: [facility];  Service: Orthopedics;  Laterality: Right;  medial  meniscal debridement, excision of plica, synovectomy  . Knee arthroscopy w/ meniscectomy Right 01-29-2011    twice, first trimmed mediscus and cyst  . Colonoscopy w/ polypectomy  LAST ONE 2013  . Knee arthroscopy Left 11/16/2013    Procedure: LEFT KNEE ARTHROSCOPY MEDIAL MENISCAL DEBRIDEMENT, CHONDROPLASTY;  Surgeon: Gearlean Alf, MD;  Location: [facility];  Service: Orthopedics;  Laterality: Left;  . Coronary stent placement  05/17/2014    PROXIMAL X 4  MID & DISTAL LAD  DIAGONAL   . Left heart catheterization with coronary angiogram N/A 05/17/2014    Procedure: LEFT HEART CATHETERIZATION WITH CORONARY ANGIOGRAM;  Surgeon: Burnell Blanks, MD;  Location: [facility] CATH LAB;  Service: Cardiovascular;  Laterality: N/A;  . Percutaneous coronary stent intervention (pci-s)  05/17/2014    Procedure: PERCUTANEOUS CORONARY STENT INTERVENTION (PCI-S);  Surgeon: Burnell Blanks, MD;  Location: [facility] CATH LAB;  Service: Cardiovascular;;  Diag1 and Prox to Distal LAD   History   Social History  . Marital Status: Married    Spouse Name: N/A  . Number of Children: 2   Social History Main Topics  . Smoking status: Former Smoker -- 1.00 packs/day for 4 years    Types: Cigarettes    Quit date: 06/14/1967  . Smokeless tobacco: Never Used  . Alcohol Use: No  . Drug Use:  No  . 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     Current Outpatient Prescriptions on File Prior to Visit  Medication Sig Dispense Refill  . ACCU-CHEK FASTCLIX LANCETS MISC Use to test blood sugar 2 times daily. Dx: E11.40 204 each 3  . acetaminophen (TYLENOL) 500 MG tablet Take 1,000 mg by mouth every 6 (six) hours as needed for mild pain. Reported on 03/31/2015    . aspirin EC 81 MG tablet Take 81 mg by mouth daily with breakfast.     .  cholecalciferol (VITAMIN D) 1000 UNITS tablet Take 5,000 Units by mouth daily.     [name] glucose blood (ACCU-CHEK AVIVA PLUS) test strip Use to test blood sugar 2 times daily. Dx: E11.40 200 each 3  . insulin NPH Human (HUMULIN N) 100 UNIT/ML injection INJECT UNDER SKIN 20 UNITS IN THE AM AND 42-46 UNITS AT BEDTIME. 20 mL 3  . insulin regular (HUMULIN R) 100 units/mL injection Inject 0.3-0.35 mLs (30-35 Units total) into the skin 3 (three) times daily before meals. 30 mL 3  . Insulin Syringe-Needle U-100 (TRUEPLUS INSULIN SYRINGE) 30G X 5/16" 0.5 ML MISC USE TO INJECT INSULIN 5 TIMES DAILY. 200 each 3  . losartan (COZAAR) 50 MG tablet TAKE 1&1/2 (75 MG TOTAL) TABLETS BY MOUTH EVERY MORNING 180 tablet 3  . metoprolol tartrate (LOPRESSOR) 25 MG tablet Take 1 tablet (25 mg total) by mouth 2 (two) times daily. 180 tablet 3  . Multiple Vitamin (MULTIVITAMIN WITH MINERALS) TABS tablet Take 1 tablet by mouth daily.    . nitroGLYCERIN (NITROSTAT) 0.4 MG SL tablet Place 1 tablet (0.4 mg total) under the tongue every 5 (five) minutes as needed for chest pain. 90 tablet 3  . Polyethylene Glycol 3350 (MIRALAX PO) Take by mouth as needed. Reported on 03/31/2015    . prasugrel (EFFIENT) 10 MG TABS tablet Take 1 tablet (10 mg total) by mouth daily. 90 tablet 3  . simvastatin (ZOCOR) 40 MG tablet TAKE 1 TABLET (40 MG) BY MOUTH AT BEDTIME. 90 tablet 2  . Vitamin D, Ergocalciferol, (DRISDOL)  50000 units CAPS capsule TAKE 1 CAPSULE (50,000 UNITS) BY MOUTH EVERY 7 DAYS FOR 12 WEEKS 12 capsule 0   No current facility-administered medications on file prior to visit.   Allergies  Allergen Reactions  . Ciprofloxacin Other (See Comments)    Abdominal pain  . Lipitor [Atorvastatin Calcium] Other (See Comments)    Severe muscle pain  . Lisinopril Cough  . Metformin And Related Diarrhea   Family History  Problem Relation Age of Onset  . Hypertension Mother   . Cancer Mother 45    ovarian  . Heart disease Father     CHF  . Heart attack Father 27  . Cancer Sister     breast- just finished her last radiation  . Heart disease Maternal Grandfather     ?  [name] Heart attack Paternal Grandfather 51  . Diabetes Neg Hx   . Heart attack Paternal Uncle 12    Sudden death   PE: BP 118/78 mmHg  Pulse 71  Temp(Src) 98 F (36.7 C) (Oral)  Resp 12  Wt 252 lb (114.306 kg)  SpO2 97% Body mass index is 36.16 kg/(m^2).  Wt Readings from Last 3 Encounters:  07/02/15 252 lb (114.306 kg)  07/01/15 252 lb 4 oz (114.42 kg)  06/12/15 252 lb 4 oz (114.42 kg)   Constitutional: Obese, in NAD Eyes: PERRLA, EOMI, no exophthalmos ENT: moist mucous membranes, no thyromegaly, no cervical lymphadenopathy Cardiovascular: RRR, No MRG Respiratory: CTA B Gastrointestinal: abdomen soft, NT, ND, BS+ Musculoskeletal: no deformities, strength intact in all 4 Skin: moist, warm, + rash: Vitiligo on arms Neurological: no tremor with outstretched hands, DTR normal in all 4  ASSESSMENT: 1. DM2, insulin-dependent, uncontrolled, with complications - CAD, status post stent 05/2014 - Dr Percival Spanish - Peripheral neuropathy - DR  PLAN:  1. Patient with long-standing, uncontrolled diabetes (last HbA1c was higher after the Holidays and even higher - 7.6% at last check this month). His  sugars are slightly higher than goal but I would have expected his HbA1c to be a little better >> will check a fructosamine level.   - as he admits to dietary indiscretions and is determined to start controlling his diet more >> will not change regimen or now:  Patient Instructions   Please continue:  Insulin Before breakfast Before lunch Before dinner Bedtime  Regular 30  35 35    NPH 20   42   Try to work on your diet.  Please come back for a follow-up appointment in 3 months.  - I advised him to continue to check CBGs - ideally 3 times a day, rotating checks - advised for yearly eye exams >> He is UTD - had the flu shot this season - Return to clinic in 3 mo with sugar log   Office Visit on 07/02/2015  Component Date Value Ref Range Status  . Fructosamine 07/02/2015 282  0 - 285 umol/L Final   Comment: Published reference interval for apparently healthy subjects between age 35 and 9 is 66 - 285 umol/L and in a poorly controlled diabetic population is 228 - 563 umol/L with a mean of 396 umol/L.    As suspected, Hemoglobin A1c calculated from fructosamine is much lower, at 6.4%.

## 2015-07-04 LAB — FRUCTOSAMINE: Fructosamine: 282 umol/L (ref 0–285)

## 2015-07-07 ENCOUNTER — Encounter: Payer: Self-pay | Admitting: Internal Medicine

## 2015-08-05 ENCOUNTER — Ambulatory Visit (HOSPITAL_BASED_OUTPATIENT_CLINIC_OR_DEPARTMENT_OTHER): Payer: Medicare Other | Attending: Cardiology | Admitting: Cardiovascular Disease

## 2015-08-05 DIAGNOSIS — G4736 Sleep related hypoventilation in conditions classified elsewhere: Secondary | ICD-10-CM | POA: Insufficient documentation

## 2015-08-05 DIAGNOSIS — Z7982 Long term (current) use of aspirin: Secondary | ICD-10-CM | POA: Diagnosis not present

## 2015-08-05 DIAGNOSIS — R0683 Snoring: Secondary | ICD-10-CM | POA: Diagnosis not present

## 2015-08-05 DIAGNOSIS — Z79899 Other long term (current) drug therapy: Secondary | ICD-10-CM | POA: Insufficient documentation

## 2015-08-05 DIAGNOSIS — G4733 Obstructive sleep apnea (adult) (pediatric): Secondary | ICD-10-CM

## 2015-08-05 DIAGNOSIS — Z794 Long term (current) use of insulin: Secondary | ICD-10-CM | POA: Insufficient documentation

## 2015-08-05 DIAGNOSIS — I493 Ventricular premature depolarization: Secondary | ICD-10-CM | POA: Insufficient documentation

## 2015-08-11 ENCOUNTER — Other Ambulatory Visit: Payer: Self-pay | Admitting: Family Medicine

## 2015-08-12 ENCOUNTER — Encounter (HOSPITAL_BASED_OUTPATIENT_CLINIC_OR_DEPARTMENT_OTHER): Payer: Self-pay | Admitting: Cardiovascular Disease

## 2015-08-12 NOTE — Procedures (Signed)
Patient Name: Michael Cunningham, Michael Cunningham Date: 08/05/2015 Gender: Male D.O.B: 1945-05-29 Age (years): 9 Referring Provider: Minus Breeding Height (inches): 70 Interpreting Physician: Shelva Majestic MD, ABSM Weight (lbs): 250 RPSGT: Madelon Lips BMI: 36 MRN: [id number] Neck Size: 18.75  CLINICAL INFORMATION Sleep Study Type: NPSG Indication for sleep study: Snoring Epworth Sleepiness Score: 8  SLEEP STUDY TECHNIQUE As per the AASM Manual for the Scoring of Sleep and Associated Events v2.3 (April 2016) with a hypopnea requiring 4% desaturations. The channels recorded and monitored were frontal, central and occipital EEG, electrooculogram (EOG), submentalis EMG (chin), nasal and oral airflow, thoracic and abdominal wall motion, anterior tibialis EMG, snore microphone, electrocardiogram, and pulse oximetry.  MEDICATIONS  ACCU-CHEK FASTCLIX LANCETS MISC      acetaminophen (TYLENOL) 500 MG tablet 1,000 mg, Every 6 hours PRN     aspirin EC 81 MG tablet 81 mg, Daily with breakfast     cholecalciferol (VITAMIN D) 1000 UNITS tablet 5,000 Units, Daily     glucose blood (ACCU-CHEK AVIVA PLUS) test strip      insulin NPH Human (HUMULIN N) 100 UNIT/ML injection      insulin regular (HUMULIN R) 100 units/mL injection 30-35 Units, 3 times daily before meals     Insulin Syringe-Needle U-100 (TRUEPLUS INSULIN SYRINGE) 30G X 5/16" 0.5 ML MISC      losartan (COZAAR) 50 MG tablet      metoprolol tartrate (LOPRESSOR) 25 MG tablet 25 mg, 2 times daily     Multiple Vitamin (MULTIVITAMIN WITH MINERALS) TABS tablet 1 tablet, Daily     nitroGLYCERIN (NITROSTAT) 0.4 MG SL tablet 0.4 mg, Every 5 min PRN     Polyethylene Glycol 3350 (MIRALAX PO) As needed     prasugrel (EFFIENT) 10 MG TABS tablet 10 mg, Daily     simvastatin (ZOCOR) 40 MG tablet   Medications self-administered by patient during sleep study : No sleep medicine administered.  SLEEP ARCHITECTURE The study was initiated at 10:03:48 PM and  ended at 4:35:46 AM. Sleep onset time was 64.5 minutes and the sleep efficiency was 61.7%. The total sleep time was 242.0 minutes. Wake after sleep onset (WASO) was 85.5 minutes Stage REM latency was 143.5 minutes. The patient spent 10.74% of the night in stage N1 sleep, 73.97% in stage N2 sleep, 0.00% in stage N3 and 15.29% in REM. Alpha intrusion was absent. Supine sleep was 93.39%.  RESPIRATORY PARAMETERS The overall apnea/hypopnea index (AHI) was 42.9 per hour. There were 166 total apneas, including 166 obstructive, 0 central and 0 mixed apneas. There were 7 hypopneas and 13 RERAs. The AHI during Stage REM sleep was 81.1 per hour. AHI while supine was 41.4 per hour. The mean oxygen saturation was 94.22%. The minimum SpO2 during sleep was 80.00%. Loud snoring was noted during this study.  CARDIAC DATA The 2 lead EKG demonstrated sinus rhythm. The mean heart rate was 63.64 beats per minute. Other EKG findings include: PVCs.  LEG MOVEMENT DATA The total PLMS were 7 with a resulting PLMS index of 1.74. Associated arousal with leg movement index was 0.0 .  IMPRESSIONS - Severe obstructive sleep apnea (AHI = 42.9/h) with events being very severe during REM sleep (AHI 81.1/h). - No significant central sleep apnea occurred during this study (CAI = 0.0/h). - Moderate oxygen desaturation to a nadir of 80.00%. - Abnornmal sleep architecture with absence of slow wave sleep and prolonged latency to REM sleep. - The patient snored with Loud snoring volume. - EKG findings  include PVCs. - Clinically significant periodic limb movements did not occur during sleep. No significant associated arousals.  DIAGNOSIS - Obstructive Sleep Apnea (327.23 [G47.33 ICD-10]) - Nocturnal Hypoxemia (327.26 [G47.36 ICD-10])  RECOMMENDATIONS - Recommendd therapeutic CPAP titration to determine optimal pressure required to alleviate sleep disordered breathing. - Efforts should be made to optimize nasal and  oropharyngeal patency. - Avoid alcohol, sedatives and other CNS depressants that may worsen sleep apnea and disrupt normal sleep architecture. - Sleep hygiene should be reviewed to assess factors that may improve sleep quality. - Weight management and regular exercise should be initiated or continued if appropriate.   Troy Sine, MD, [place], American Board of Sleep Medicine  ELECTRONICALLY SIGNED ON:  08/12/2015, 8:26 PM [place] PH: [phone]   FX: [phone] [place]

## 2015-08-14 ENCOUNTER — Telehealth: Payer: Self-pay | Admitting: *Deleted

## 2015-08-14 ENCOUNTER — Other Ambulatory Visit: Payer: Self-pay | Admitting: *Deleted

## 2015-08-14 DIAGNOSIS — G4733 Obstructive sleep apnea (adult) (pediatric): Secondary | ICD-10-CM

## 2015-08-14 NOTE — Telephone Encounter (Signed)
Informed patient of sleep study results and recommendations. Patient informed me that he will be leaving for about 3 weeks to go to [place]. Explained to patient that there is a time urgency due to him being on Medicare. He still prefers to wait to start treatment when he returns from his trip. I told him that it may be that he can get the study and we can work on [employer] approval while he is away. When he returns we can just get him set up. Patient agrees. We will attempt to get him set up prior to his trip.

## 2015-08-15 ENCOUNTER — Telehealth: Payer: Self-pay | Admitting: Family Medicine

## 2015-08-15 NOTE — Telephone Encounter (Signed)
Pt dropped off document of Consent Release Info to [place]. Would like it to be mailed at [facility] [address] Address.

## 2015-08-15 NOTE — Telephone Encounter (Signed)
Documents scanned and emailed to medical records

## 2015-08-18 ENCOUNTER — Ambulatory Visit (HOSPITAL_BASED_OUTPATIENT_CLINIC_OR_DEPARTMENT_OTHER): Payer: Medicare Other | Admitting: Cardiovascular Disease

## 2015-08-19 ENCOUNTER — Telehealth: Payer: Self-pay | Admitting: Cardiovascular Disease

## 2015-08-19 NOTE — Telephone Encounter (Signed)
Patient was notified of sleep study results on 08/14/15 Patient had CPAP titration 08/18/15 Unsure if this was when the computer crashed [facility] sleep center per [place], [place] recommendation @ [phone] LM for a call back regarding the next steps for patient - will he need to reschedule or was enough info gathered?

## 2015-08-19 NOTE — Telephone Encounter (Signed)
New Message  Pt states during his sleep study the computer system crashed. Pt was not able to complete study. Pt requested to speak with RN to proceed forward. Please call back to advise

## 2015-08-19 NOTE — Telephone Encounter (Signed)
Returned call to patient. 245am patient woke up with dry mouth and he was told that the computer crashed at that time and IT was working on it.  Patient states they had started the CPAP titration but was unable to reach goal.  Patient is about to go out of town for an extended period of time - concerned since sleep study report said his OSA was severe and he is leaving for [place] on 6/22 Informed patient that we will contact him as soon as we hear from [place] patient their phone # but he states he does not have a way to take down their # at this time

## 2015-08-20 ENCOUNTER — Ambulatory Visit (INDEPENDENT_AMBULATORY_CARE_PROVIDER_SITE_OTHER): Payer: Medicare Other | Admitting: Podiatry

## 2015-08-20 ENCOUNTER — Ambulatory Visit: Payer: Medicare Other | Admitting: Podiatry

## 2015-08-20 ENCOUNTER — Encounter: Payer: Self-pay | Admitting: Podiatry

## 2015-08-20 DIAGNOSIS — M79676 Pain in unspecified toe(s): Secondary | ICD-10-CM

## 2015-08-20 DIAGNOSIS — B351 Tinea unguium: Secondary | ICD-10-CM | POA: Diagnosis not present

## 2015-08-20 NOTE — Progress Notes (Signed)
Patient ID: Michael Cunningham, male   DOB: [birth year], 70 y.o.   MRN: [id number]  Subjective: 70 y.o. returns the office today for painful, elongated, thickened toenails which he cannot trim himself. Denies any redness or drainage around the nails. Denies any acute changes since last appointment and no new complaints today. Denies any systemic complaints such as fevers, chills, nausea, vomiting.  A1c 7.6  Objective: AAO 3, NAD DP/PT pulses palpable, CRT less than 3 seconds Nails hypertrophic, dystrophic, elongated, brittle, discolored 10. There is tenderness overlying the nails 1-5 bilaterally. There is no surrounding erythema or drainage along the nail sites. No open lesions or pre-ulcerative lesions are identified. No other areas of tenderness bilateral lower extremities. No overlying edema, erythema, increased warmth. No pain with calf compression, swelling, warmth, erythema.  Assessment: Patient presents with symptomatic onychomycosis  Plan: -Treatment options including alternatives, risks, complications were discussed -Nails sharply debrided 10 without complication/bleeding. -Discussed daily foot inspection. If there are any changes, to call the office immediately.  -Follow-up in 3 months or sooner if any problems are to arise. In the meantime, encouraged to call the office with any questions, concerns, changes symptoms.  Celesta Gentile, DPM

## 2015-08-24 ENCOUNTER — Ambulatory Visit (HOSPITAL_BASED_OUTPATIENT_CLINIC_OR_DEPARTMENT_OTHER): Payer: Medicare Other | Attending: Cardiovascular Disease | Admitting: Cardiovascular Disease

## 2015-08-24 VITALS — Ht 70.0 in | Wt 240.0 lb

## 2015-08-24 DIAGNOSIS — Z79899 Other long term (current) drug therapy: Secondary | ICD-10-CM | POA: Insufficient documentation

## 2015-08-24 DIAGNOSIS — Z794 Long term (current) use of insulin: Secondary | ICD-10-CM | POA: Diagnosis not present

## 2015-08-24 DIAGNOSIS — R0683 Snoring: Secondary | ICD-10-CM | POA: Insufficient documentation

## 2015-08-24 DIAGNOSIS — G4733 Obstructive sleep apnea (adult) (pediatric): Secondary | ICD-10-CM

## 2015-08-24 DIAGNOSIS — Z7982 Long term (current) use of aspirin: Secondary | ICD-10-CM | POA: Diagnosis not present

## 2015-08-25 NOTE — Procedures (Signed)
Cancelled erroneous encounter.

## 2015-08-25 NOTE — Sleep Study (Signed)
Pt cancelled; erroneous encounter

## 2015-08-25 NOTE — Sleep Study (Signed)
Appointment was cancelled; erroneous encounter

## 2015-08-26 ENCOUNTER — Encounter (HOSPITAL_BASED_OUTPATIENT_CLINIC_OR_DEPARTMENT_OTHER): Payer: Self-pay | Admitting: Cardiovascular Disease

## 2015-08-26 NOTE — Telephone Encounter (Signed)
Patient had CPAP titration on 08/24/15

## 2015-08-26 NOTE — Procedures (Signed)
Patient Name: Michael Cunningham, Michael Cunningham Date: 08/24/2015 Gender: Male D.O.B: 09/06/1945 Age (years): 36 Referring Provider: Minus Breeding Height (inches): 70 Interpreting Physician: Shelva Majestic MD, ABSM Weight (lbs): 250 RPSGT: Madelon Lips BMI: 36 MRN: [id number] Neck Size: 18.75  CLINICAL INFORMATION The patient is referred for a CPAP titration to treat sleep apnea.   Date of NPSG, Split Night or HST:  08/12/2015  AHI 42.8/h overall; AHI 81.1/h during REM sleep.  SLEEP STUDY TECHNIQUE As per the AASM Manual for the Scoring of Sleep and Associated Events v2.3 (April 2016) with a hypopnea requiring 4% desaturations. The channels recorded and monitored were frontal, central and occipital EEG, electrooculogram (EOG), submentalis EMG (chin), nasal and oral airflow, thoracic and abdominal wall motion, anterior tibialis EMG, snore microphone, electrocardiogram, and pulse oximetry. Continuous positive airway pressure (CPAP) was initiated at the beginning of the study and titrated to treat sleep-disordered breathing.  MEDICATIONS  Vitamin D, Ergocalciferol, (DRISDOL) 50000 units CAPS capsule      simvastatin (ZOCOR) 40 MG tablet      prasugrel (EFFIENT) 10 MG TABS tablet 10 mg, Daily     Polyethylene Glycol 3350 (MIRALAX PO) As needed     nitroGLYCERIN (NITROSTAT) 0.4 MG SL tablet 0.4 mg, Every 5 min PRN     Multiple Vitamin (MULTIVITAMIN WITH MINERALS) TABS tablet 1 tablet, Daily     metoprolol tartrate (LOPRESSOR) 25 MG tablet 25 mg, 2 times daily     losartan (COZAAR) 50 MG tablet      Insulin Syringe-Needle U-100 (TRUEPLUS INSULIN SYRINGE) 30G X 5/16" 0.5 ML MISC      insulin regular (HUMULIN R) 100 units/mL injection 30-35 Units, 3 times daily before meals     insulin NPH Human (HUMULIN N) 100 UNIT/ML injection      glucose blood (ACCU-CHEK AVIVA PLUS) test strip      cholecalciferol (VITAMIN D) 1000 UNITS tablet 5,000 Units, Daily     aspirin EC 81 MG tablet 81 mg, Daily with  breakfast     acetaminophen (TYLENOL) 500 MG tablet 1,000 mg, Every 6 hours PRN     ACCU-CHEK FASTCLIX LANCETS MISC   Medications administered by patient during sleep study : No sleep medicine administered.  TECHNICIAN COMMENTS Comments added by technician: BATHROOM BREAK X1  Comments added by scorer:   RESPIRATORY PARAMETERS Optimal PAP Pressure (cm): 14 AHI at Optimal Pressure (/hr): 0.8 Overall Minimal O2 (%): 79.00 Supine % at Optimal Pressure (%): 100 Minimal O2 at Optimal Pressure (%): 92.0    SLEEP ARCHITECTURE The study was initiated at 9:47:33 PM and ended at 4:26:20 AM. Sleep onset time was 6.4 minutes and the sleep efficiency was 87.3%. The total sleep time was 348.0 minutes. The patient spent 14.22% of the night in stage N1 sleep, 63.65% in stage N2 sleep, 0.00% in stage N3 and 22.13% in REM.Stage REM latency was 65.0 minutes Wake after sleep onset was 44.4. Alpha intrusion was absent. Supine sleep was 100.00%.  CARDIAC DATA The 2 lead EKG demonstrated sinus rhythm. The mean heart rate was 60.05 beats per minute. Other EKG findings include: None.  LEG MOVEMENT DATA The total Periodic Limb Movements of Sleep (PLMS) were 0. The PLMS index was 0.00. A PLMS index of <15 is considered normal in adults.  IMPRESSIONS - The optimal PAP pressure was 14 cm of water; AHI 0.8/h with min O2 sat at 92%. - Central sleep apnea was not noted during this titration (CAI = 0.2/h). - Severe oxygen  desaturation to a nadir of 79.00% during REM sleep at 8 cm water presure. - The patient snored with Moderate snoring volume during this titration study. - No cardiac abnormalities were observed during this study. - Clinically significant periodic limb movements were not noted during this study. Arousals associated with PLMs were rare.  DIAGNOSIS - Obstructive Sleep Apnea (327.23 [G47.33 ICD-10])  RECOMMENDATIONS - Recommend an initial trial of CPAP therapy on 14 cm H2O with heated  humidification. A Large size Resmed Full Face Mask AirFit F20 mask was used for the titration study. - Avoid alcohol, sedatives and other CNS depressants that may worsen sleep apnea and disrupt normal sleep architecture. - Sleep hygiene should be reviewed to assess factors that may improve sleep quality. - Weight management (BMI 36) and regular exercise should be initiated or continued. - Recommend a download be obtained in 30 days and sleep clinic evaluation.   Troy Sine, MD, [place], American Board of Sleep Medicine  ELECTRONICALLY SIGNED ON:  08/26/2015, 11:17 PM [place] PH: [phone]   FX: [phone] [place]

## 2015-09-01 ENCOUNTER — Telehealth: Payer: Self-pay | Admitting: *Deleted

## 2015-09-01 ENCOUNTER — Other Ambulatory Visit: Payer: Self-pay | Admitting: *Deleted

## 2015-09-01 ENCOUNTER — Telehealth: Payer: Self-pay | Admitting: Cardiovascular Disease

## 2015-09-01 DIAGNOSIS — G4733 Obstructive sleep apnea (adult) (pediatric): Secondary | ICD-10-CM

## 2015-09-01 NOTE — Telephone Encounter (Signed)
Spoke with patient informing him the next step in the process of him getting his CPAP machine. Order placed into [employer].

## 2015-09-01 NOTE — Telephone Encounter (Signed)
Returned a call to patient informing him that his sleep information will [facility] to [facility]. They will not be able to set him up before he leaves to go out of town. It usually takes about a 1 week for them to get insurance benefits approved. Patient will be out of town until after the 4th of July. [facility] will be notified of this.

## 2015-09-01 NOTE — Telephone Encounter (Signed)
Pt calling re results of sleep study-hasn't heard back from [place] as to the next step-pt going out of town and needs to know if something needs to be done before he leaves-pls call

## 2015-09-16 IMAGING — CT CT ENTEROGRAPHY (ABD-PELV W/ CM)
2 of 5 series · 12 of 36 positions shown, 18 images · IV contrast ([ID] [id number] & [ID] [id number])
Comparison: CT scan 04/19/2014

CLINICAL DATA: Followup partial small bowel obstruction.

EXAM:
CT ABDOMEN AND PELVIS WITH CONTRAST (ENTEROGRAPHY)
TECHNIQUE: Multidetector CT of the abdomen and pelvis during bolus
administration of intravenous contrast. Negative oral contrast
VoLumen was given.
CONTRAST:  125 cc [id number]

[Series 3: enterography (id) · axial · 0.90mm/px · z∈[-485,-10]mm · 11 of 226 slices shown, 16 images]
[im 18/226  soft-tissue]
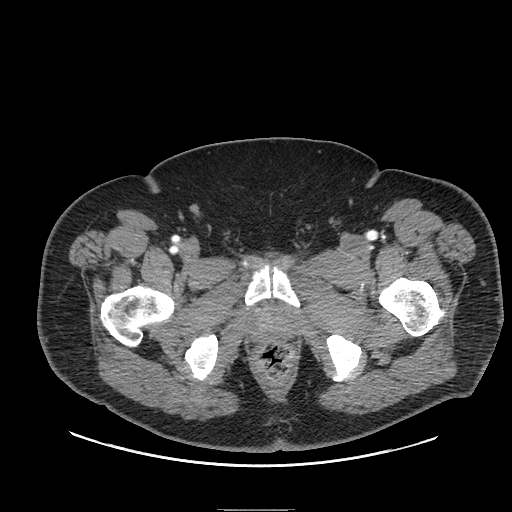
[im 18/226  bone]
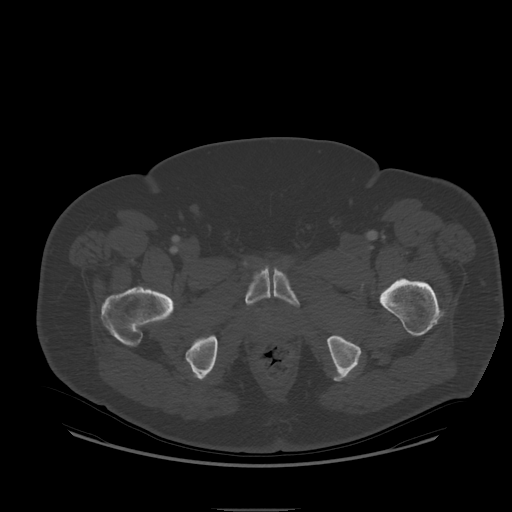
[im 35/226  soft-tissue]
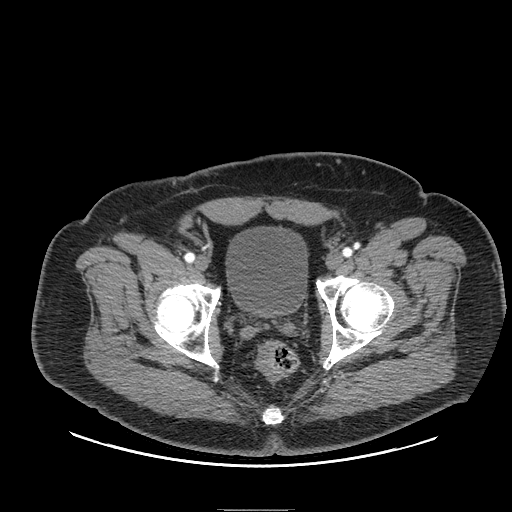
[im 70/226  soft-tissue]
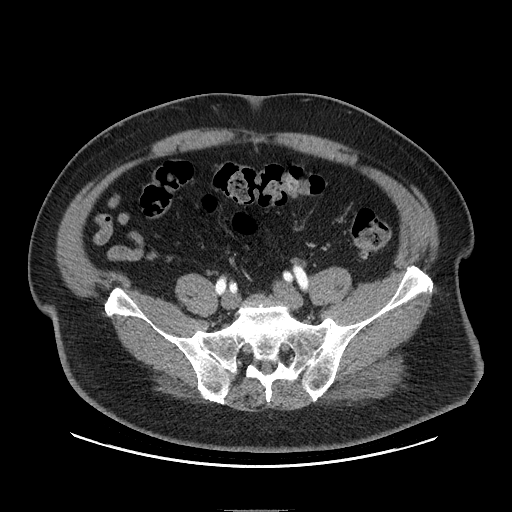
[im 87/226  soft-tissue]
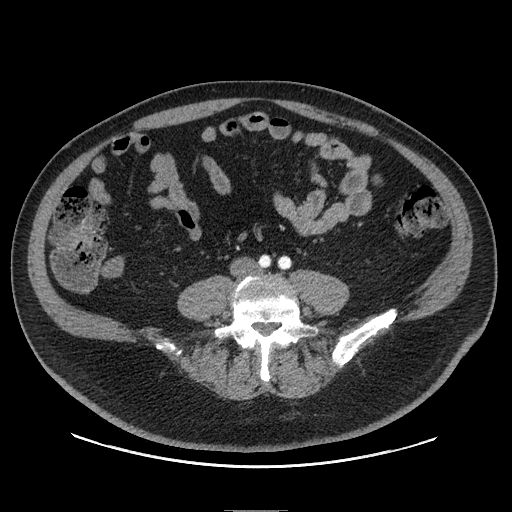
[im 104/226  soft-tissue]
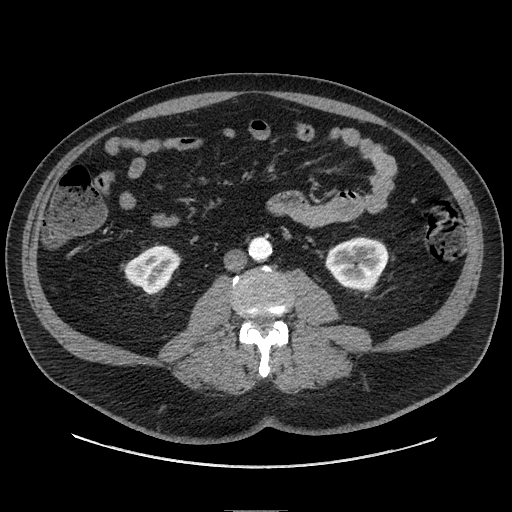
[im 122/226  soft-tissue]
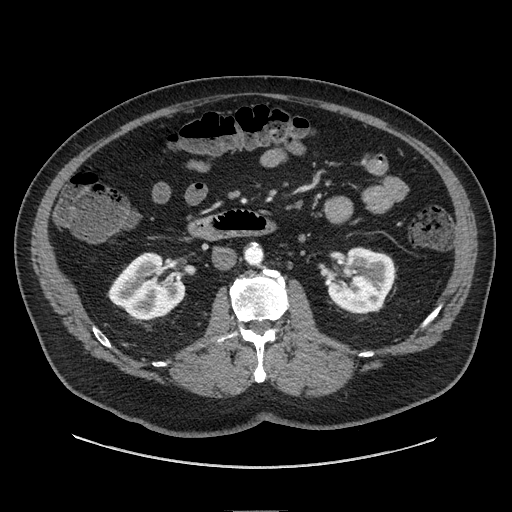
[im 139/226  soft-tissue]
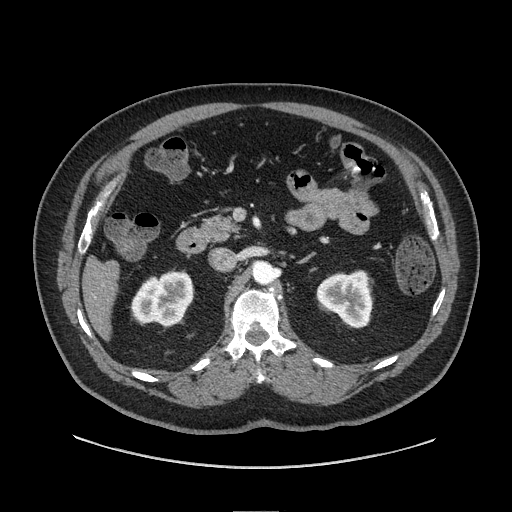
[im 156/226  lung]
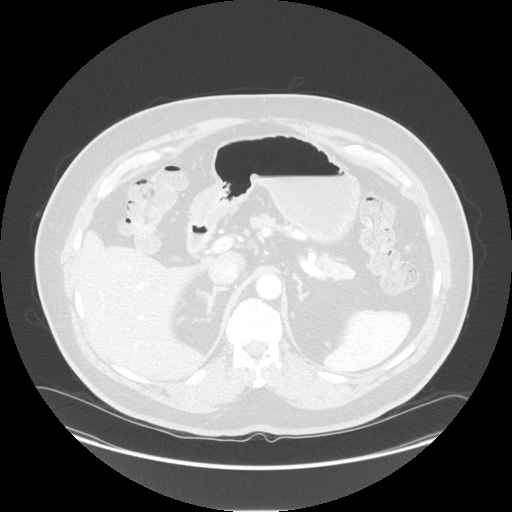
[im 174/226  soft-tissue]
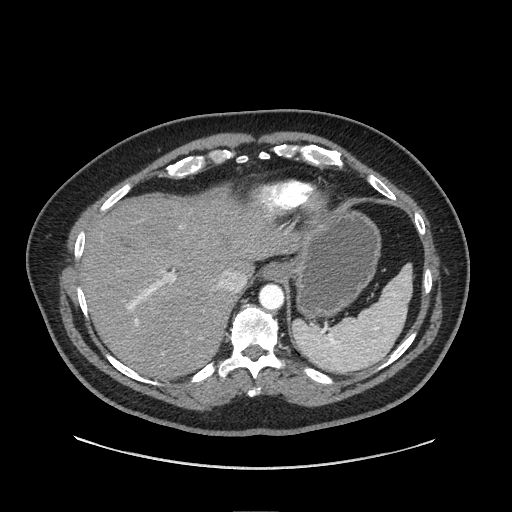
[im 174/226  lung]
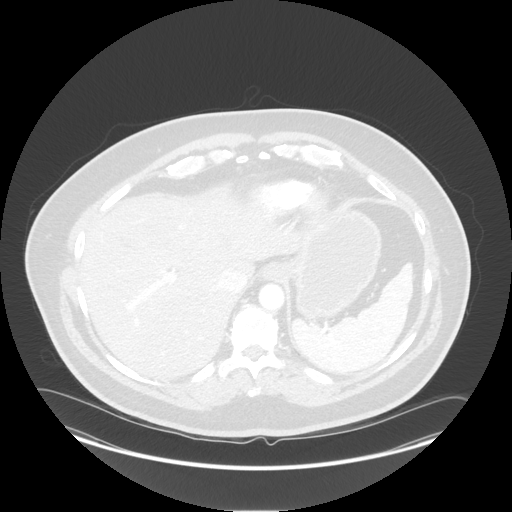
[im 191/226  soft-tissue]
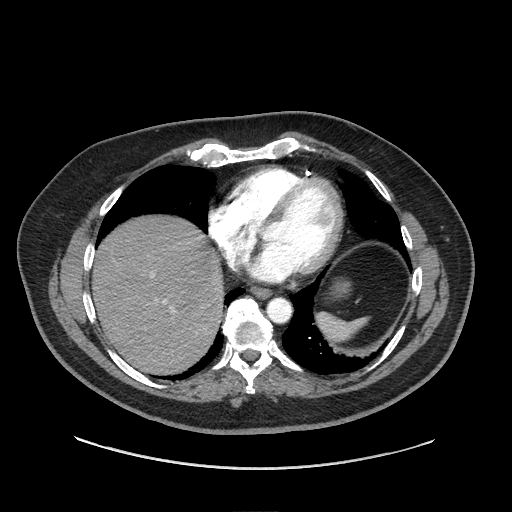
[im 191/226  lung]
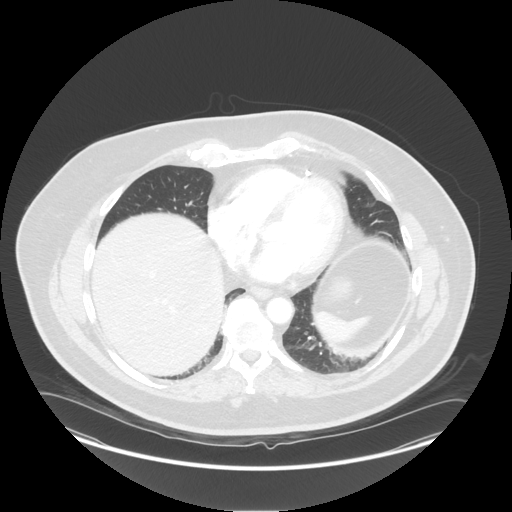
[im 191/226  bone]
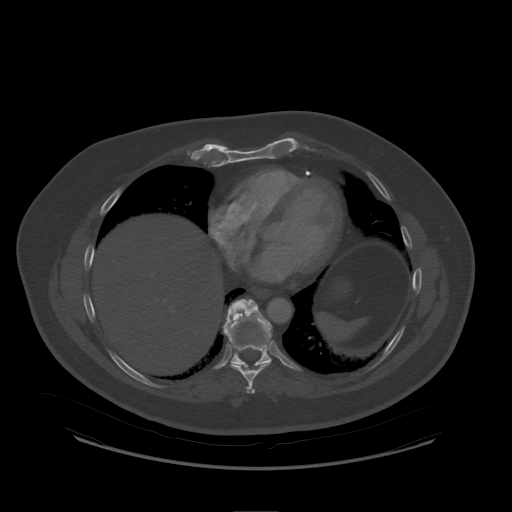
[im 208/226  soft-tissue]
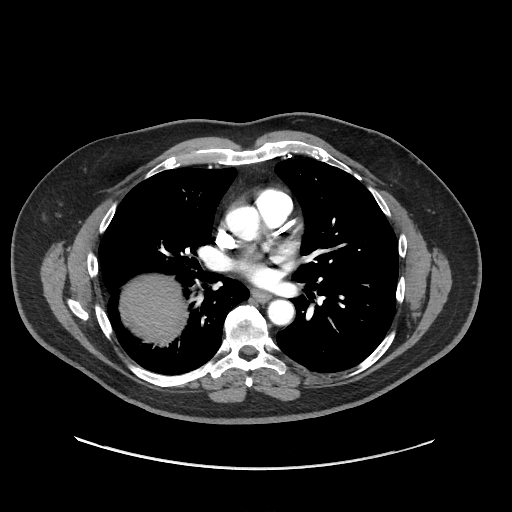
[im 208/226  lung]
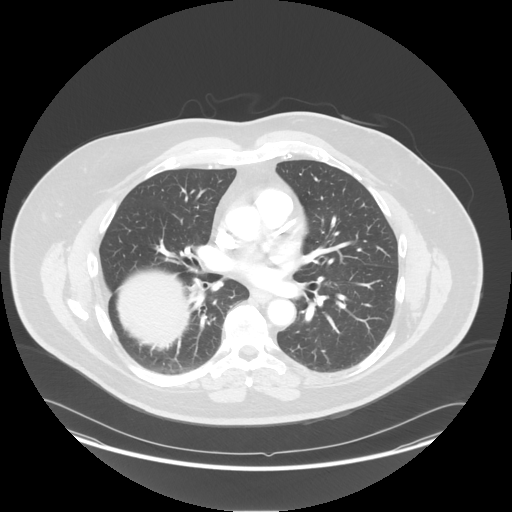

[Series 601: coronal body · coronal · 1.11mm/px · 1 of 144 slices shown, 2 images]
[im 48/144  soft-tissue]
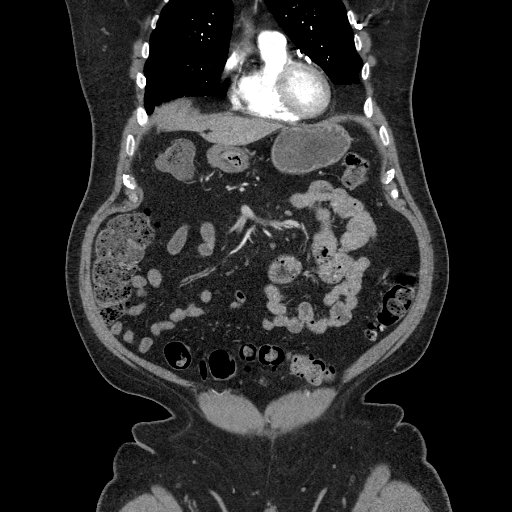
[im 48/144  bone]
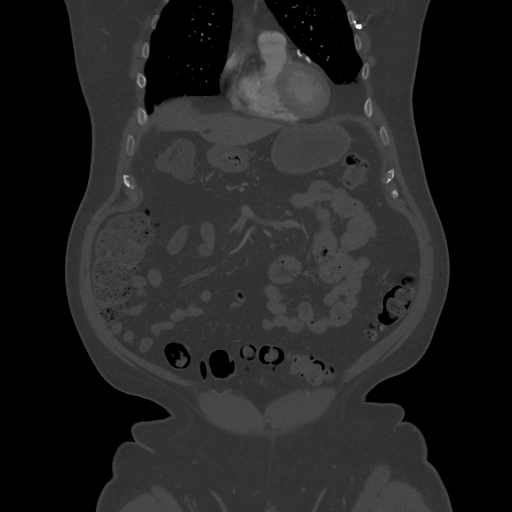

[12 of 36 positions shown; findings below may reference images not displayed]

FINDINGS: Lower chest: The lung bases are clear of acute process. There is
minimal dependent atelectasis and mild vascular crowding due to
eventration of the hemidiaphragms. No pleural effusion. The heart is
normal in size. There are coronary artery calcifications and
coronary stents.

Hepatobiliary: Diffuse and fairly marked fatty infiltration of the
liver but no focal hepatic lesions or intrahepatic biliary
dilatation. Small layering gallstones are noted in the gallbladder.
No common bile duct dilatation.

Pancreas: Normal

Spleen: Normal

Adrenals/Urinary Tract: Stable small left adrenal gland nodule,
likely benign adenoma. The right adrenal gland is normal. Both
kidneys are normal.

Stomach/Bowel: The stomach and duodenum are unremarkable. The small
bowel is not well distended with the negative oral contrast agent.
No small bowel dilatation, obstructive findings, inflammation or
mass lesions.

Vascular/Lymphatic: No mesenteric or retroperitoneal mass or
adenopathy. Small scattered lymph nodes are noted. The aorta and
branch vessels are patent. The major venous structures are patent.

Other: The bladder, prostate gland and seminal vesicles are
unremarkable. No pelvic mass or adenopathy. No free pelvic fluid
collections. No inguinal mass or adenopathy. No abdominal wall
hernia.

Musculoskeletal: No significant bony findings. Moderate degenerative
changes involving the spine.
IMPRESSION: Poor distention of the small bowel but no obvious small bowel
lesions, inflammatory changes or obstructive findings. No mesenteric
or retroperitoneal mass or adenopathy.

Scattered atherosclerotic calcifications involving the aorta but no
aneurysm or dissection.

Diffuse fatty infiltration of the liver.

Extensive coronary artery disease.

Cholelithiasis.

## 2015-09-17 ENCOUNTER — Telehealth: Payer: Self-pay | Admitting: Cardiovascular Disease

## 2015-09-17 NOTE — Telephone Encounter (Signed)
SPOKE TO PATIENT PATIENT STATES HE HAS NOT HEARD FROM [facility] CONCERNING CPAP SET UP BUT HE WAS OUT OF TOWN UNTIL TODAY.  RN INFORMED PATIENT WILL CONTACT TO SEE IF [facility] IS IN PROCESS WITH SET UP AND CALL HIM BACK.  RN SENT MESSAGE TO MELISSA STENSON

## 2015-09-17 NOTE — Telephone Encounter (Signed)
New Message  Pt call requesting to speak with RN. Pt states Dr. Claiborne Billings placed his cpap and wants to know if he needs to follow up with Dr. Claiborne Billings or Dr. Percival Spanish. Please call back to discuss

## 2015-09-22 ENCOUNTER — Telehealth: Payer: Self-pay | Admitting: Cardiovascular Disease

## 2015-09-22 NOTE — Telephone Encounter (Signed)
No answer. Left message to call back.   Routed to [employer].

## 2015-09-22 NOTE — Telephone Encounter (Signed)
New message      Talk to the nurse about his CPAP and the settings

## 2015-09-23 ENCOUNTER — Other Ambulatory Visit: Payer: Self-pay

## 2015-09-23 ENCOUNTER — Telehealth: Payer: Self-pay | Admitting: Internal Medicine

## 2015-09-23 MED ORDER — INSULIN NPH (HUMAN) (ISOPHANE) 100 UNIT/ML ~~LOC~~ SUSP: SUBCUTANEOUS | Status: DC

## 2015-09-23 MED ORDER — INSULIN REGULAR HUMAN 100 UNIT/ML IJ SOLN: 30.0000 [IU] | Freq: Three times a day (TID) | INTRAMUSCULAR | Status: DC

## 2015-09-23 NOTE — Telephone Encounter (Signed)
Patient requesting Humalin R and Humalin N sent to pharmacy. Patient has appointment 10/01/15.

## 2015-09-23 NOTE — Telephone Encounter (Signed)
Pt needs refills on humulin R and N called to [facility] # [phone]

## 2015-09-25 ENCOUNTER — Telehealth: Payer: Self-pay | Admitting: *Deleted

## 2015-09-25 NOTE — Telephone Encounter (Signed)
Patient called to inform me that he was set up last Friday by [facility] on his CPAP therapy. He states that his pressure setting is set on 14 cmH20. He wanted Dr Claiborne Billings to know that he has a small slit in  His right eye that was made when he was a child to relieve sinus pressure. The patient has noticed that he has" bubbling " in his eye sometimes when the air hits his eye.this does not occur all of the time. Only sometimes. He denies having any eye pain or blurred vision. He pretty much wanted Dr Claiborne Billings to be aware of this and give his opinion on whether or not he needs to leave his CPAP pressure setting on 14 CmH2O. Message will be routed to Dr Claiborne Billings for comment.

## 2015-09-25 NOTE — Telephone Encounter (Signed)
Received a call from patient. He is having some CPAP issues. I will discuss issues with Dr Claiborne Billings and call patient back once i have a recommendation form Dr Claiborne Billings. Patient agrees with plan and thanked me for speaking with him.

## 2015-09-30 ENCOUNTER — Encounter: Payer: Self-pay | Admitting: Cardiovascular Disease

## 2015-10-01 ENCOUNTER — Ambulatory Visit (INDEPENDENT_AMBULATORY_CARE_PROVIDER_SITE_OTHER): Payer: Medicare Other | Admitting: Internal Medicine

## 2015-10-01 ENCOUNTER — Encounter: Payer: Self-pay | Admitting: Internal Medicine

## 2015-10-01 VITALS — BP 142/78 | HR 62 | Ht 69.0 in | Wt 254.0 lb

## 2015-10-01 DIAGNOSIS — E1165 Type 2 diabetes mellitus with hyperglycemia: Secondary | ICD-10-CM

## 2015-10-01 DIAGNOSIS — E1159 Type 2 diabetes mellitus with other circulatory complications: Secondary | ICD-10-CM

## 2015-10-01 DIAGNOSIS — I2510 Atherosclerotic heart disease of native coronary artery without angina pectoris: Secondary | ICD-10-CM | POA: Diagnosis not present

## 2015-10-01 NOTE — Progress Notes (Signed)
Patient ID: Michael Cunningham, male   DOB: [birth year], 70 y.o.   MRN: [id number]  HPI: Michael Cunningham is a 70 y.o.-year-old male, returning for f/u DM2, dx in 1999, insulin-dependent since ~2008, uncontrolled, with complications (CAD, peripheral neuropathy, DR). Last visit 3 mo ago.  He gained 14 lbs in last month. He denies this, mentions he was not as low as 240 lbs.  Last hemoglobin A1c: Hemoglobin A1c calculated from fructosamine is much lower, at 6.4%. Lab Results  Component Value Date   HGBA1C 7.6* 06/24/2015   HGBA1C 7.4* 03/26/2015   HGBA1C 7.2* 12/19/2014   Pt is on a regimen of:  Insulin Before breakfast Before lunch Before dinner Bedtime  Regular 30  35 35    NPH 20   42   Sliding scale of R insulin (SSI) - not using: - 150-175: + 1 unit  - 176-200: + 2 units  - 201-225: + 3 units  - 226-250: + 4 units  - 251-275: + 5 units - 276-300: + 6 units  Please do not use more than 20 units of R insulin before a meal prior to exercise (rehab, working in the yard, etc.)  He cannot afford analog insulins. He tried Metformin R and XR >> diarrhea He tried Januvia.  Pt checks his sugars 1-2x a day, rotating checks - brings the log from last time: - am: 90-170 >> 131-190 >> 143-205, 222 >> 120-199 >> 98, 113-167, 208 >> ? - 2h after b'fast: n/c >> 177 >> n.c - before lunch: 70-110 >> 87-168, 250 >> 84, 88 >> 82 >> 78, 111-165, 193 >> ? - 2h after lunch: n/c >> 76 >> 70 >> n/c - before dinner: 100-150 >> 73, 89-129, 207 >> 70 >> 72 >> 122-152 >> ? - 2h after dinner: n/c - bedtime: 100-160 >> 137 >> n/c >> 73, 141-186 >> 135-167, 183 >> ? - nighttime: lows >> wake him up >> corrects them (cookie) >> 90 >> n/c + fewer lows - Lowest sugar was 70 >> 70 >> 87 >> ?; he has hypoglycemia awareness at 90.  Highest sugar was 270 >> 250 >> 222 >> 206 >> 208 >> ?  Glucometer: AccuChek  Pt's meals are: - Breakfast: cheerios + skim milk + banana + strawberries - Lunch: sandwich or leftover   - Dinner: meat + veggies + starch - Snacks: PB crackers; wheat thins, apple or grapes, pear   - no CKD, last BUN/creatinine:  Lab Results  Component Value Date   BUN 17 06/24/2015   CREATININE 0.80 06/24/2015   on losartan - last set of lipids: Lab Results  Component Value Date   CHOL 138 06/24/2015   HDL 30.90* 06/24/2015   LDLCALC 77 06/24/2015   LDLDIRECT 108.0 04/16/2014   TRIG 152.0* 06/24/2015   CHOLHDL 4 06/24/2015   on simvastatin - last eye exam was in 11/26/2014. + DR (mild). + angle-closure glaucoma >> had surgery in both eyes >> had blurry vision L eye >> resolved.  - no numbness and tingling in his L foot. He had a foot exam 12/2014 (dorsum L hallux + fungal inf) - [facility] (Dr Amalia Hailey). Very mild PN in R foot.   He also has a history of hypertension and hyperlipidemia.  ROS: Constitutional: no weight gain/loss, no fatigue, no subjective hyperthermia/hypothermia Eyes: + blurry vision, no xerophthalmia ENT: no sore throat, no nodules palpated in throat, no dysphagia/odynophagia, no hoarseness, + tinnitus Cardiovascular: no CP/SOB/palpitations/leg swelling Respiratory:  no cough/SOB Gastrointestinal: no N/V/D/C Musculoskeletal: no muscle/joint aches Skin: no rashes Neurological: no tremors/numbness/tingling/dizziness  I reviewed pt's medications, allergies, PMH, social hx, family hx, and changes were documented in the history of present illness. Otherwise, unchanged from my initial visit note:  Past Medical History  Diagnosis Date  . Hypertension   . Multiple lipomas     hx. mulitple and some remains -arms, legs  . Diverticulosis of colon     LEFT SIDE  . History of small bowel obstruction     X2  28 YRS AGO AND LAST ONE 04/2014--  RESOLVED WITHOUT SURGICAL INTERVENTION  . Type 2 diabetes mellitus ([place])   . Left ureteral calculus   . Complication of anesthesia     11'12 Surgery with excrutiating "head about to explode" upon awakening  -lasted  several hours postop  ( SURGERY DONE 08-13-2011 AT [facility] PT DID OK POST OP)  . Measles =  . Hyperlipidemia   . Recurrent kidney stones 07/08/2013    Follows with Dr Gaynelle Arabian He believes they are Calcium oxalate stones  . Obesity, unspecified 09/18/2013  . CAD (coronary artery disease)     a. 05/2014 [place] s/p DES x4 to mid-distLAD and DESx1 to DI  . Vitamin D deficiency 07/02/2014  . Thrombocytopenia ([place]) 07/07/2014  . Knee pain, bilateral 08/13/2011   Past Surgical History  Procedure Laterality Date  . Lipoma excision  1975    multiple, 10-12  . Knee arthroscopy  08/13/2011    right ARTHROSCOPY KNEE;  Surgeon: Gearlean Alf, MD;  Location: [facility];  Service: Orthopedics;  Laterality: Right;  medial  meniscal debridement, excision of plica, synovectomy  . Knee arthroscopy w/ meniscectomy Right 01-29-2011    twice, first trimmed mediscus and cyst  . Colonoscopy w/ polypectomy  LAST ONE 2013  . Knee arthroscopy Left 11/16/2013    Procedure: LEFT KNEE ARTHROSCOPY MEDIAL MENISCAL DEBRIDEMENT, CHONDROPLASTY;  Surgeon: Gearlean Alf, MD;  Location: [facility];  Service: Orthopedics;  Laterality: Left;  . Coronary stent placement  05/17/2014    PROXIMAL X 4  MID & DISTAL LAD  DIAGONAL   . Left heart catheterization with coronary angiogram N/A 05/17/2014    Procedure: LEFT HEART CATHETERIZATION WITH CORONARY ANGIOGRAM;  Surgeon: Burnell Blanks, MD;  Location: [facility] CATH LAB;  Service: Cardiovascular;  Laterality: N/A;  . Percutaneous coronary stent intervention (pci-s)  05/17/2014    Procedure: PERCUTANEOUS CORONARY STENT INTERVENTION (PCI-S);  Surgeon: Burnell Blanks, MD;  Location: [facility] CATH LAB;  Service: Cardiovascular;;  Diag1 and Prox to Distal LAD   History   Social History  . Marital Status: Married    Spouse Name: N/A  . Number of Children: 2   Social History Main Topics  . Smoking status: Former Smoker -- 1.00 packs/day for 4 years    Types: Cigarettes    Quit date: 06/14/1967  .  Smokeless tobacco: Never Used  . Alcohol Use: No  . Drug Use: No  . 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     Current Outpatient Prescriptions on File Prior to Visit  Medication Sig Dispense Refill  . ACCU-CHEK FASTCLIX LANCETS MISC Use to test blood sugar 2 times daily. Dx: E11.40 204 each 3  . acetaminophen (TYLENOL) 500 MG tablet Take 1,000 mg by mouth every 6 (six) hours as needed for mild pain. Reported on 03/31/2015    .  aspirin EC 81 MG tablet Take 81 mg by mouth daily with breakfast.     . cholecalciferol (VITAMIN D) 1000 UNITS tablet Take 5,000 Units by mouth daily.     [name] glucose blood (ACCU-CHEK AVIVA PLUS) test strip Use to test blood sugar 2 times daily. Dx: E11.40 200 each 3  . insulin NPH Human (HUMULIN N) 100 UNIT/ML injection INJECT UNDER SKIN 20 UNITS IN THE AM AND 42-46 UNITS AT BEDTIME. 20 mL 3  . insulin regular (HUMULIN R) 100 units/mL injection Inject 0.3-0.35 mLs (30-35 Units total) into the skin 3 (three) times daily before meals. 30 mL 3  . Insulin Syringe-Needle U-100 (TRUEPLUS INSULIN SYRINGE) 30G X 5/16" 0.5 ML MISC USE TO INJECT INSULIN 5 TIMES DAILY. 200 each 3  . losartan (COZAAR) 50 MG tablet TAKE 1&1/2 (75 MG TOTAL) TABLETS BY MOUTH EVERY MORNING 180 tablet 3  . metoprolol tartrate (LOPRESSOR) 25 MG tablet Take 1 tablet (25 mg total) by mouth 2 (two) times daily. 180 tablet 3  . Multiple Vitamin (MULTIVITAMIN WITH MINERALS) TABS tablet Take 1 tablet by mouth daily.    . nitroGLYCERIN (NITROSTAT) 0.4 MG SL tablet Place 1 tablet (0.4 mg total) under the tongue every 5 (five) minutes as needed for chest pain. 90 tablet 3  . Polyethylene Glycol 3350 (MIRALAX PO) Take by mouth as needed. Reported on 03/31/2015    . prasugrel (EFFIENT) 10 MG TABS tablet Take 1 tablet (10 mg total) by mouth daily. 90 tablet 3  . simvastatin (ZOCOR) 40 MG tablet TAKE 1 TABLET (40 MG) BY MOUTH AT  BEDTIME. 90 tablet 2  . Vitamin D, Ergocalciferol, (DRISDOL) 50000 units CAPS capsule TAKE 1 CAPSULE BY MOUTH EVERY 7 DAYS FOR 12 WEEKS 12 capsule 0   No current facility-administered medications on file prior to visit.   Allergies  Allergen Reactions  . Ciprofloxacin Other (See Comments)    Abdominal pain  . Lipitor [Atorvastatin Calcium] Other (See Comments)    Severe muscle pain  . Lisinopril Cough  . Metformin And Related Diarrhea   Family History  Problem Relation Age of Onset  . Hypertension Mother   . Cancer Mother 25    ovarian  . Heart disease Father     CHF  . Heart attack Father 33  . Cancer Sister     breast- just finished her last radiation  . Heart disease Maternal Grandfather     ?  [name] Heart attack Paternal Grandfather 13  . Diabetes Neg Hx   . Heart attack Paternal Uncle 41    Sudden death   PE: BP 142/78 mmHg  Pulse 62  Ht 5\' 9"  (1.753 m)  Wt 254 lb (115.214 kg)  BMI 37.49 kg/m2  SpO2 97% Body mass index is 37.49 kg/(m^2).  Wt Readings from Last 3 Encounters:  10/01/15 254 lb (115.214 kg)  08/24/15 240 lb (108.863 kg)  08/18/15 240 lb (108.863 kg)   Constitutional: Obese, in NAD Eyes: PERRLA, EOMI, no exophthalmos ENT: moist mucous membranes, no thyromegaly, no cervical lymphadenopathy Cardiovascular: RRR, No MRG Respiratory: CTA B Gastrointestinal: abdomen soft, NT, ND, BS+ Musculoskeletal: no deformities, strength intact in all 4 Skin: moist, warm, + rash: Vitiligo on arms Neurological: no tremor with outstretched hands, DTR normal in all 4  ASSESSMENT: 1. DM2, insulin-dependent, uncontrolled, with complications - CAD, status post stent 05/2014 - Dr Antoine Poche - Peripheral neuropathy - DR  PLAN:  1. Patient with long-standing, fairly well controlled diabetes. His fructosamine was at  goal at last visit. He brings a sugar log but this is the same as last visit >> ?? Sugars now. - will not change regimen or now:  Patient Instructions    Please continue: Insulin Before breakfast Before lunch Before dinner Bedtime  Regular 30  35 35    NPH 20   42   Please come back for a follow-up appointment in 3 months.  - I advised him to continue to check CBGs - ideally 3 times a day, rotating checks - advised for yearly eye exams >> He is UTD - will get a fructosamine - Return to clinic in 3 mo with sugar log   Office Visit on 10/01/2015  Component Date Value Ref Range Status  . Fructosamine 10/01/2015 295* 0 - 285 umol/L Final   Comment: Published reference interval for apparently healthy subjects between age 82 and 73 is 74 - 285 umol/L and in a poorly controlled diabetic population is 228 - 563 umol/L with a mean of 396 umol/L.    HbA1c calculated from fructosamine is still great, at 6.6%!

## 2015-10-01 NOTE — Patient Instructions (Signed)
Please continue: Insulin Before breakfast Before lunch Before dinner Bedtime  Regular 30  35 35    NPH 20   42   Please come back for a follow-up appointment in 3 months.  Please stop at the lab.

## 2015-10-02 LAB — FRUCTOSAMINE: FRUCTOSAMINE: 295 umol/L — AB (ref 0–285)

## 2015-10-08 DIAGNOSIS — D692 Other nonthrombocytopenic purpura: Secondary | ICD-10-CM | POA: Diagnosis not present

## 2015-10-08 DIAGNOSIS — D225 Melanocytic nevi of trunk: Secondary | ICD-10-CM | POA: Diagnosis not present

## 2015-10-08 DIAGNOSIS — D1800 Hemangioma unspecified site: Secondary | ICD-10-CM | POA: Diagnosis not present

## 2015-10-08 DIAGNOSIS — L80 Vitiligo: Secondary | ICD-10-CM | POA: Diagnosis not present

## 2015-10-08 DIAGNOSIS — L821 Other seborrheic keratosis: Secondary | ICD-10-CM | POA: Diagnosis not present

## 2015-10-08 DIAGNOSIS — L814 Other melanin hyperpigmentation: Secondary | ICD-10-CM | POA: Diagnosis not present

## 2015-10-10 ENCOUNTER — Encounter: Payer: Self-pay | Admitting: Cardiology

## 2015-10-10 ENCOUNTER — Telehealth: Payer: Self-pay | Admitting: *Deleted

## 2015-10-10 NOTE — Telephone Encounter (Signed)
-----   Message from Minus Breeding, MD sent at 10/10/2015  1:49 PM EDT ----- Per patient request  "I have discovered that Effient is NOT on the [place]. Would you write a note to explain to the [place] physician that I am a non-responder to Plavix and that Effient was the necessary drug? They can get Effient, they need evidence to prove it is necessary. I will come to your office anytime to pick up the note. "  Please do this

## 2015-10-10 NOTE — Telephone Encounter (Signed)
Letter written, pt will pick up on Monday.Michael Cunningham

## 2015-10-13 ENCOUNTER — Encounter: Payer: Self-pay | Admitting: Cardiology

## 2015-10-14 DIAGNOSIS — E119 Type 2 diabetes mellitus without complications: Secondary | ICD-10-CM | POA: Diagnosis not present

## 2015-10-14 DIAGNOSIS — H35342 Macular cyst, hole, or pseudohole, left eye: Secondary | ICD-10-CM | POA: Diagnosis not present

## 2015-10-14 DIAGNOSIS — H5213 Myopia, bilateral: Secondary | ICD-10-CM | POA: Diagnosis not present

## 2015-10-14 LAB — HM DIABETES EYE EXAM

## 2015-10-20 NOTE — Telephone Encounter (Signed)
probaly coming from nasolacrimal duct. If bother can try to decrease pressure by 2 cm to see if improves

## 2015-10-20 NOTE — Progress Notes (Signed)
Cardiology Office Note   Date:  10/22/2015   ID:  Michael Cunningham, DOB March 07, 1946, MRN [id number]  PCP:  Penni Homans, MD  Cardiologist:   Minus Breeding, MD   CAD  History of Present Illness: Michael Cunningham is a 70 y.o. male who presents for evaluation of CAD. I saw him in February of last year and sent him for a stress test which was high risk. He subsequently was found to have severe diffuse disease in the proximal mid and distal LAD and had PTCA and DES stenting 4. He had severe stenosis in his first diagonal also treated with drug-eluting stent. Since I last saw him he has had treatment of his sleep apnea.  He returns for follow up.  He has done well from a cardiovascular standpoint.  He has not been exercising however.  The patient denies any new symptoms such as chest discomfort, neck or arm discomfort. There has been no new shortness of breath, PND or orthopnea. There have been no reported palpitations, presyncope or syncope.  Past Medical History:  Diagnosis Date  . CAD (coronary artery disease)    a. 05/2014 [place] s/p DES x4 to mid-distLAD and DESx1 to DI  . Complication of anesthesia    11'12 Surgery with excrutiating "head about to explode" upon awakening  -lasted several hours postop  ( SURGERY DONE 08-13-2011 AT [facility] PT DID OK POST OP)  . Diverticulosis of colon    LEFT SIDE  . History of small bowel obstruction    X2  28 YRS AGO AND LAST ONE 04/2014--  RESOLVED WITHOUT SURGICAL INTERVENTION  . Hyperlipidemia   . Hypertension   . Knee pain, bilateral 08/13/2011  . Left ureteral calculus   . Measles =  . Multiple lipomas    hx. mulitple and some remains -arms, legs  . Obesity, unspecified 09/18/2013  . Recurrent kidney stones 07/08/2013   Follows with Dr Gaynelle Arabian He believes they are Calcium oxalate stones  . Thrombocytopenia ([place]) 07/07/2014  . Type 2 diabetes mellitus ([place])   . Vitamin D deficiency 07/02/2014    Past Surgical History:  Procedure Laterality Date  .  COLONOSCOPY W/ POLYPECTOMY  LAST ONE 2013  . CORONARY STENT PLACEMENT  05/17/2014   PROXIMAL X 4  MID & DISTAL LAD  DIAGONAL   . KNEE ARTHROSCOPY  08/13/2011   right ARTHROSCOPY KNEE;  Surgeon: Gearlean Alf, MD;  Location: [facility];  Service: Orthopedics;  Laterality: Right;  medial  meniscal debridement, excision of plica, synovectomy  . KNEE ARTHROSCOPY Left 11/16/2013   Procedure: LEFT KNEE ARTHROSCOPY MEDIAL MENISCAL DEBRIDEMENT, CHONDROPLASTY;  Surgeon: Gearlean Alf, MD;  Location: [facility];  Service: Orthopedics;  Laterality: Left;  . KNEE ARTHROSCOPY W/ MENISCECTOMY Right 01-29-2011   twice, first trimmed mediscus and cyst  . LEFT HEART CATHETERIZATION WITH CORONARY ANGIOGRAM N/A 05/17/2014   Procedure: LEFT HEART CATHETERIZATION WITH CORONARY ANGIOGRAM;  Surgeon: Burnell Blanks, MD;  Location: [facility] CATH LAB;  Service: Cardiovascular;  Laterality: N/A;  . [place]   multiple, 10-12  . PERCUTANEOUS CORONARY STENT INTERVENTION (PCI-S)  05/17/2014   Procedure: PERCUTANEOUS CORONARY STENT INTERVENTION (PCI-S);  Surgeon: Burnell Blanks, MD;  Location: [facility] CATH LAB;  Service: Cardiovascular;;  Diag1 and Prox to Distal LAD     Current Outpatient Prescriptions  Medication Sig Dispense Refill  . ACCU-CHEK FASTCLIX LANCETS MISC Use to test blood sugar 2 times daily. Dx: E11.40 204 each 3  .  acetaminophen (TYLENOL) 500 MG tablet Take 1,000 mg by mouth every 6 (six) hours as needed for mild pain. Reported on 03/31/2015    . aspirin EC 81 MG tablet Take 81 mg by mouth daily with breakfast.     . cholecalciferol (VITAMIN D) 1000 UNITS tablet Take 5,000 Units by mouth daily.     [name] glucose blood (ACCU-CHEK AVIVA PLUS) test strip Use to test blood sugar 2 times daily. Dx: E11.40 200 each 3  . insulin NPH Human (HUMULIN N) 100 UNIT/ML injection INJECT UNDER SKIN 20 UNITS IN THE AM AND 42-46 UNITS AT BEDTIME. 20 mL 3  . insulin regular (HUMULIN R) 100 units/mL injection Inject  0.3-0.35 mLs (30-35 Units total) into the skin 3 (three) times daily before meals. 30 mL 3  . Insulin Syringe-Needle U-100 (TRUEPLUS INSULIN SYRINGE) 30G X 5/16" 0.5 ML MISC USE TO INJECT INSULIN 5 TIMES DAILY. 200 each 3  . losartan (COZAAR) 50 MG tablet TAKE 1&1/2 (75 MG TOTAL) TABLETS BY MOUTH EVERY MORNING 180 tablet 3  . metoprolol tartrate (LOPRESSOR) 25 MG tablet Take 1 tablet (25 mg total) by mouth 2 (two) times daily. 180 tablet 3  . Multiple Vitamin (MULTIVITAMIN WITH MINERALS) TABS tablet Take 1 tablet by mouth daily.    . nitroGLYCERIN (NITROSTAT) 0.4 MG SL tablet Place 1 tablet (0.4 mg total) under the tongue every 5 (five) minutes as needed for chest pain. 90 tablet 3  . Polyethylene Glycol 3350 (MIRALAX PO) Take by mouth as needed. Reported on 03/31/2015    . prasugrel (EFFIENT) 10 MG TABS tablet Take 1 tablet (10 mg total) by mouth daily. 90 tablet 3  . Probiotic Product (PROBIOTIC-10 PO) Take 1 tablet by mouth daily.    . simvastatin (ZOCOR) 40 MG tablet TAKE 1 TABLET (40 MG) BY MOUTH AT BEDTIME. 90 tablet 2  . triamcinolone ointment (KENALOG) 0.1 % Apply 1 application topically daily.    . Vitamin D, Ergocalciferol, (DRISDOL) 50000 units CAPS capsule TAKE 1 CAPSULE BY MOUTH EVERY 7 DAYS FOR 12 WEEKS 12 capsule 0   No current facility-administered medications for this visit.     Allergies:   Ciprofloxacin; Lipitor [atorvastatin calcium]; Lisinopril; and Metformin and related    ROS:  Please see the history of present illness.   Otherwise, review of systems are positive for none.   All other systems are reviewed and negative.    PHYSICAL EXAM: VS:  BP 136/82   Pulse 71   Ht 5\' 10"  (1.778 m)   Wt 255 lb 6.4 oz (115.8 kg)   BMI 36.65 kg/m  , BMI Body mass index is 36.65 kg/m. GENERAL:  Well appearing NECK:  No jugular venous distention, waveform within normal limits, carotid upstroke brisk and symmetric, no bruits, no thyromegaly LUNGS:  Clear to auscultation  bilaterally CHEST:  Unremarkable HEART:  PMI not displaced or sustained,S1 and S2 within normal limits, no S3, no S4, no clicks, no rubs, no murmurs ABD:  Flat, positive bowel sounds normal in frequency in pitch, no bruits, no rebound, no guarding, no midline pulsatile mass, no hepatomegaly, no splenomegaly EXT:  2 plus pulses throughout, no edema, no cyanosis no clubbing, venous stasis changes  EKG:  None  Recent Labs: 06/24/2015: ALT 29; BUN 17; Creatinine, Ser 0.80; Hemoglobin 14.0; Platelets 202.0; Potassium 4.1; Sodium 140; TSH 1.55    Lipid Panel    Component Value Date/Time   CHOL 138 06/24/2015 0757   TRIG 152.0 (H) 06/24/2015 0757  HDL 30.90 (L) 06/24/2015 0757   CHOLHDL 4 06/24/2015 0757   VLDL 30.4 06/24/2015 0757   LDLCALC 77 06/24/2015 0757   LDLDIRECT 108.0 04/16/2014 1022      Wt Readings from Last 3 Encounters:  10/22/15 255 lb 6.4 oz (115.8 kg)  10/01/15 254 lb (115.2 kg)  08/24/15 240 lb (108.9 kg)      Other studies Reviewed: Additional studies/ records that were reviewed today include:  ED labs.  Review of the above records demonstrates:  Please see elsewhere in the note.   ASSESSMENT AND PLAN:  CAD/PCI/DES:  .  Patient is doing well. He will continue with risk reduction. No change in therapy is indicated.  Per Dr. Angelena Form, who did the procedure, he is on DAPT for life.  I would like him to be exercising and we talked about this.   HTN:  The blood pressure is at target. No change in medications is indicated. We will continue with therapeutic lifestyle changes (TLC).  OVERWEIGHT:  The patient understands the need to lose weight with diet and exercise. We have discussed specific strategies for this.  SLEEP APNEA:   He is now being followed for this.   DYSLIPIDEMIA:  His LDL at the [place] was 65.  He will remain on the meds as listed and I reviewed recent labs.    Current medicines are reviewed at length with the patient today.  The patient does not have  concerns regarding medicines.  The following changes have been made:  no change   Disposition:   FU me in 12 months.     Signed, Minus Breeding, MD  10/22/2015 9:29 AM    [facility]

## 2015-10-21 ENCOUNTER — Telehealth: Payer: Self-pay | Admitting: Internal Medicine

## 2015-10-21 NOTE — Telephone Encounter (Signed)
Returned patient phone call as he had questions about his insulin that the [place] had sent him.  Patient is normally taking Humalin N and they sent him Novolin NPH instead and he would like to know if this is the same thing??  Also normally taking Humalin R and they sent him Regular Insulin Novolog. Patient is just curious if these are okay for him to take like he would his regular insulins and would like your opinion before taking.  Please advise. Thank you!

## 2015-10-21 NOTE — Telephone Encounter (Signed)
PT requests call back regarding his medications, he said he wasn't sure what the [place] sent him is accurate.

## 2015-10-21 NOTE — Telephone Encounter (Signed)
Yes, it is OK

## 2015-10-22 ENCOUNTER — Telehealth: Payer: Self-pay

## 2015-10-22 ENCOUNTER — Encounter: Payer: Self-pay | Admitting: Cardiology

## 2015-10-22 ENCOUNTER — Ambulatory Visit (INDEPENDENT_AMBULATORY_CARE_PROVIDER_SITE_OTHER): Payer: Medicare Other | Admitting: Cardiology

## 2015-10-22 VITALS — BP 136/82 | HR 71 | Ht 70.0 in | Wt 255.4 lb

## 2015-10-22 DIAGNOSIS — I2510 Atherosclerotic heart disease of native coronary artery without angina pectoris: Secondary | ICD-10-CM

## 2015-10-22 NOTE — Telephone Encounter (Signed)
I called patient to notify him that it was okay to take what the [place] has sent him regarding his insulins per Dr.Gherghe. Advised patient to call back if any issues, and gave call back number.

## 2015-10-22 NOTE — Patient Instructions (Signed)
Medication Instructions:  Continue current miedications  Labwork: NONE  Testing/Procedures: NONE  Follow-Up: Your physician wants you to follow-up in: 1 Year. You will receive a reminder letter in the mail two months in advance. If you don't receive a letter, please call our office to schedule the follow-up appointment.   Any Other Special Instructions Will Be Listed Below (If Applicable).   If you need a refill on your cardiac medications before your next appointment, please call your pharmacy.

## 2015-10-23 NOTE — Telephone Encounter (Signed)
Patient had appointment with Dr Percival Spanish on 10/22/15. Concern was addressed at that time and adjustments were made per Dr Evette Georges orders.

## 2015-11-06 ENCOUNTER — Other Ambulatory Visit: Payer: Self-pay | Admitting: Family Medicine

## 2015-11-06 MED ORDER — VITAMIN D (ERGOCALCIFEROL) 1.25 MG (50000 UNIT) PO CAPS: ORAL_CAPSULE | ORAL | 1 refills | Status: DC

## 2015-11-06 NOTE — Telephone Encounter (Signed)
Advise if you want him to remain on vitamin D 50, 000

## 2015-11-14 ENCOUNTER — Encounter: Payer: Self-pay | Admitting: Family Medicine

## 2015-11-18 ENCOUNTER — Encounter: Payer: Self-pay | Admitting: Family Medicine

## 2015-11-20 ENCOUNTER — Encounter: Payer: Self-pay | Admitting: Cardiovascular Disease

## 2015-11-20 ENCOUNTER — Ambulatory Visit (INDEPENDENT_AMBULATORY_CARE_PROVIDER_SITE_OTHER): Payer: Medicare Other | Admitting: Cardiovascular Disease

## 2015-11-20 VITALS — BP 126/82 | HR 56 | Ht 68.0 in | Wt 255.9 lb

## 2015-11-20 DIAGNOSIS — E669 Obesity, unspecified: Secondary | ICD-10-CM

## 2015-11-20 DIAGNOSIS — Z9989 Dependence on other enabling machines and devices: Principal | ICD-10-CM

## 2015-11-20 DIAGNOSIS — R0683 Snoring: Secondary | ICD-10-CM

## 2015-11-20 DIAGNOSIS — G4733 Obstructive sleep apnea (adult) (pediatric): Secondary | ICD-10-CM | POA: Diagnosis not present

## 2015-11-20 DIAGNOSIS — I2510 Atherosclerotic heart disease of native coronary artery without angina pectoris: Secondary | ICD-10-CM | POA: Diagnosis not present

## 2015-11-20 DIAGNOSIS — I10 Essential (primary) hypertension: Secondary | ICD-10-CM

## 2015-11-20 DIAGNOSIS — E785 Hyperlipidemia, unspecified: Secondary | ICD-10-CM

## 2015-11-20 DIAGNOSIS — E119 Type 2 diabetes mellitus without complications: Secondary | ICD-10-CM

## 2015-11-20 DIAGNOSIS — E1169 Type 2 diabetes mellitus with other specified complication: Secondary | ICD-10-CM

## 2015-11-20 NOTE — Patient Instructions (Signed)
Your physician wants you to follow-up in: 1 year or sooner if needed in sleep clinic with Dr Claiborne Billings. You will receive a reminder letter in the mail two months in advance. If you don't receive a letter, please call our office to schedule the follow-up appointment.  In the meantime return to the care of Dr Percival Spanish.
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
## 2015-11-25 ENCOUNTER — Encounter: Payer: Self-pay | Admitting: Family Medicine

## 2015-11-27 NOTE — Telephone Encounter (Signed)
Spoke with patient this am after numerous attempts at conversations. He is in the donut hole with his meds and he is having trouble paying for his Effient and insulins. He has gone to the [place] to see if they can help. He is a [place] Vet that was exposed to [employer] so they have established that he should be service connected due to his diabetes and CAD with 5 stents. He is service connected 60% now so they cover his meds. He is a Plavix non responder so his cardiologist Dr Percival Spanish wrote him a letter so they cover his Effient. He has recently been diagnosed with Sleep Apnea so he is trying to get that connected. He has tinnitus and hearing loss as well he will. He is just calling us to let us know.

## 2015-12-10 ENCOUNTER — Ambulatory Visit (INDEPENDENT_AMBULATORY_CARE_PROVIDER_SITE_OTHER): Payer: Medicare Other | Admitting: Podiatry

## 2015-12-10 ENCOUNTER — Encounter: Payer: Self-pay | Admitting: Podiatry

## 2015-12-10 VITALS — BP 148/78 | HR 65 | Resp 18

## 2015-12-10 DIAGNOSIS — B351 Tinea unguium: Secondary | ICD-10-CM

## 2015-12-10 DIAGNOSIS — M79676 Pain in unspecified toe(s): Secondary | ICD-10-CM

## 2015-12-10 NOTE — Patient Instructions (Signed)
Diabetes and Foot Care Diabetes may cause you to have problems because of poor blood supply (circulation) to your feet and legs. This may cause the skin on your feet to become thinner, break easier, and heal more slowly. Your skin may become dry, and the skin may peel and crack. You may also have nerve damage in your legs and feet causing decreased feeling in them. You may not notice minor injuries to your feet that could lead to infections or more serious problems. Taking care of your feet is one of the most important things you can do for yourself.  HOME CARE INSTRUCTIONS  Wear shoes at all times, even in the house. Do not go barefoot. Bare feet are easily injured.  Check your feet daily for blisters, cuts, and redness. If you cannot see the bottom of your feet, use a mirror or ask someone for help.  Wash your feet with warm water (do not use hot water) and mild soap. Then pat your feet and the areas between your toes until they are completely dry. Do not soak your feet as this can dry your skin.  Apply a moisturizing lotion or petroleum jelly (that does not contain alcohol and is unscented) to the skin on your feet and to dry, brittle toenails. Do not apply lotion between your toes.  Trim your toenails straight across. Do not dig under them or around the cuticle. File the edges of your nails with an emery board or nail file.  Do not cut corns or calluses or try to remove them with medicine.  Wear clean socks or stockings every day. Make sure they are not too tight. Do not wear knee-high stockings since they may decrease blood flow to your legs.  Wear shoes that fit properly and have enough cushioning. To break in new shoes, wear them for just a few hours a day. This prevents you from injuring your feet. Always look in your shoes before you put them on to be sure there are no objects inside.  Do not cross your legs. This may decrease the blood flow to your feet.  If you find a minor scrape,  cut, or break in the skin on your feet, keep it and the skin around it clean and dry. These areas may be cleansed with mild soap and water. Do not cleanse the area with peroxide, alcohol, or iodine.  When you remove an adhesive bandage, be sure not to damage the skin around it.  If you have a wound, look at it several times a day to make sure it is healing.  Do not use heating pads or hot water bottles. They may burn your skin. If you have lost feeling in your feet or legs, you may not know it is happening until it is too late.  Make sure your health care provider performs a complete foot exam at least annually or more often if you have foot problems. Report any cuts, sores, or bruises to your health care provider immediately. SEEK MEDICAL CARE IF:   You have an injury that is not healing.  You have cuts or breaks in the skin.  You have an ingrown nail.  You notice redness on your legs or feet.  You feel burning or tingling in your legs or feet.  You have pain or cramps in your legs and feet.  Your legs or feet are numb.  Your feet always feel cold. SEEK IMMEDIATE MEDICAL CARE IF:   There is increasing redness,   swelling, or pain in or around a wound.  There is a red line that goes up your leg.  Pus is coming from a wound.  You develop a fever or as directed by your health care provider.  You notice a bad smell coming from an ulcer or wound.   This information is not intended to replace advice given to you by your health care provider. Make sure you discuss any questions you have with your health care provider.   Document Released: 02/27/2000 Document Revised: 11/01/2012 Document Reviewed: 08/08/2012 Elsevier Interactive Patient Education 2016 [employer]  

## 2015-12-11 NOTE — Progress Notes (Signed)
Patient ID: Michael Cunningham, male   DOB: [birth year], 70 y.o.   MRN: [id number]    Subjective: This patient presents today complaining of elongated and thickened toenails for term comfortable walking wearing shoes and requests nail debridement  Objective: DP and PT pulses 2/4 bilaterally Capillary reflex immediate bilaterally Sensation to 10 g monofilament wire intact 5/5 bilaterally Vibratory sensation reactive bilaterally Ankle reflex equal and reactive bilaterally No open skin lesions bilaterally The toenails are elongated, discolored, hypertrophic, deformed and tender to direct palpation 6-10  Assessment: Type II diabetic Symptomatic onychomycoses 6-10  Plan: Debridement toenails 6-10 mechanically an electrical without any bleeding  Reappoint 3 months

## 2015-12-19 ENCOUNTER — Encounter: Payer: Self-pay | Admitting: Family Medicine

## 2015-12-25 ENCOUNTER — Other Ambulatory Visit (INDEPENDENT_AMBULATORY_CARE_PROVIDER_SITE_OTHER): Payer: Medicare Other

## 2015-12-25 DIAGNOSIS — E559 Vitamin D deficiency, unspecified: Secondary | ICD-10-CM

## 2015-12-25 DIAGNOSIS — E1169 Type 2 diabetes mellitus with other specified complication: Secondary | ICD-10-CM | POA: Diagnosis not present

## 2015-12-25 DIAGNOSIS — E669 Obesity, unspecified: Secondary | ICD-10-CM

## 2015-12-25 DIAGNOSIS — E785 Hyperlipidemia, unspecified: Secondary | ICD-10-CM

## 2015-12-25 DIAGNOSIS — I10 Essential (primary) hypertension: Secondary | ICD-10-CM | POA: Diagnosis not present

## 2015-12-25 LAB — COMPREHENSIVE METABOLIC PANEL
ALBUMIN: 3.9 g/dL (ref 3.5–5.2)
ALK PHOS: 75 U/L (ref 39–117)
ALT: 23 U/L (ref 0–53)
AST: 19 U/L (ref 0–37)
BUN: 14 mg/dL (ref 6–23)
CHLORIDE: 102 meq/L (ref 96–112)
CO2: 29 mEq/L (ref 19–32)
CREATININE: 0.88 mg/dL (ref 0.40–1.50)
Calcium: 9.3 mg/dL (ref 8.4–10.5)
GFR: 90.61 mL/min (ref >60.00)
GLUCOSE: 175 mg/dL — AB (ref 70–99)
POTASSIUM: 4.8 meq/L (ref 3.5–5.1)
SODIUM: 138 meq/L (ref 135–145)
TOTAL PROTEIN: 7.1 g/dL (ref 6.0–8.3)
Total Bilirubin: 0.8 mg/dL (ref 0.2–1.2)

## 2015-12-25 LAB — CBC
HEMATOCRIT: 43.7 % (ref 39.0–52.0)
Hemoglobin: 14.9 g/dL (ref 13.0–17.0)
MCHC: 34.2 g/dL (ref 30.0–36.0)
MCV: 91.5 fl (ref 78.0–100.0)
Platelets: 167 10*3/uL (ref 150.0–400.0)
RBC: 4.77 Mil/uL (ref 4.22–5.81)
RDW: 13.6 % (ref 11.5–15.5)
WBC: 5.8 10*3/uL (ref 4.0–10.5)

## 2015-12-25 LAB — LIPID PANEL
CHOLESTEROL: 144 mg/dL (ref 0–200)
HDL: 34.1 mg/dL — ABNORMAL LOW (ref >39.00)
LDL Cholesterol: 72 mg/dL (ref 0–99)
NONHDL: 109.89
Total CHOL/HDL Ratio: 4
Triglycerides: 190 mg/dL — ABNORMAL HIGH (ref 0.0–149.0)
VLDL: 38 mg/dL (ref 0.0–40.0)

## 2015-12-25 LAB — HEMOGLOBIN A1C: Hgb A1c MFr Bld: 7 % — ABNORMAL HIGH (ref 4.6–6.5)

## 2015-12-25 LAB — TSH: TSH: 2.12 u[IU]/mL (ref 0.35–4.50)

## 2015-12-25 LAB — VITAMIN D 25 HYDROXY (VIT D DEFICIENCY, FRACTURES): VITD: 45.74 ng/mL (ref 30.00–100.00)

## 2015-12-31 ENCOUNTER — Ambulatory Visit (INDEPENDENT_AMBULATORY_CARE_PROVIDER_SITE_OTHER): Payer: Medicare Other | Admitting: Internal Medicine

## 2015-12-31 ENCOUNTER — Encounter: Payer: Self-pay | Admitting: Internal Medicine

## 2015-12-31 VITALS — BP 124/78 | HR 62 | Ht 69.5 in | Wt 259.0 lb

## 2015-12-31 DIAGNOSIS — E1165 Type 2 diabetes mellitus with hyperglycemia: Secondary | ICD-10-CM

## 2015-12-31 DIAGNOSIS — E1142 Type 2 diabetes mellitus with diabetic polyneuropathy: Secondary | ICD-10-CM

## 2015-12-31 DIAGNOSIS — I2510 Atherosclerotic heart disease of native coronary artery without angina pectoris: Secondary | ICD-10-CM | POA: Diagnosis not present

## 2015-12-31 NOTE — Progress Notes (Signed)
Patient ID: Michael Cunningham, male   DOB: October 25, 1945, 70 y.o.   MRN: [id number]  HPI: Michael Cunningham is a 70 y.o.-year-old male, returning for f/u DM2, dx in 1999, insulin-dependent since ~2008, uncontrolled, with complications (CAD, peripheral neuropathy, DR). Last visit 3 mo ago.  He is 60% disabled >> highest priority group within his insurance.  Last hemoglobin A1c: 10/01/2015: HbA1c calculated from fructosamine is still great, at 6.6%. 06/24/2015: HbA1c calculated from fructosamine is much lower, at 6.4%. Lab Results  Component Value Date   HGBA1C 7.0 (H) 12/25/2015   HGBA1C 7.6 (H) 06/24/2015   HGBA1C 7.4 (H) 03/26/2015   Pt is on a regimen of:  Insulin Before breakfast Before lunch Before dinner Bedtime  Regular 30  35 35    NPH 20   42   Sliding scale of R insulin (SSI) - not using:  Please do not use more than 20 units of R insulin before a meal prior to exercise (rehab, working in the yard, etc.)  He cannot afford analog insulins. He tried Metformin R and XR >> diarrhea He tried Januvia.  Pt checks his sugars 1-2x a day, rotating checks: - am: 90-170 >> 131-190 >> 143-205, 222 >> 120-199 >> 98, 113-167, 208 >> ? >> 121-172, 188, [address] 157 - 2h after b'fast: n/c >> 177 >> n.c - before lunch: 70-110 >> 87-168, 250 >> 84, 88 >> 82 >> 78, 111-165, 193 >> ? >> 82-120 - 2h after lunch: n/c >> 76 >> 70 >> n/c - before dinner: 100-150 >> 73, 89-129, 207 >> 70 >> 72 >> 122-152 >> ? >> 94-133 - 2h after dinner: n/c - bedtime: 100-160 >> 137 >> n/c >> 73, 141-186 >> 135-167, 183 >> ? >> 137-154 - nighttime: lows >> wake him up >> corrects them (cookie) >> 90 >> n/c + fewer lows - Lowest sugar was 70 >> 70 >> 87 >> ?; he has hypoglycemia awareness at 90.  Highest sugar was 270 >> 250 >> 222 >> 206 >> 208 >> ?  Glucometer: AccuChek [employer]iva >> Precision Extra by Abbott (given by [facility]).  Pt's meals are: - Breakfast: cheerios + skim milk + banana + strawberries - Lunch: sandwich or  leftover  - Dinner: meat + veggies + starch - Snacks: PB crackers; wheat thins, apple or grapes, pear   - no CKD, last BUN/creatinine:  Lab Results  Component Value Date   BUN 14 12/25/2015   CREATININE 0.88 12/25/2015   on losartan - last set of lipids: Lab Results  Component Value Date   CHOL 144 12/25/2015   HDL 34.10 (L) 12/25/2015   LDLCALC 72 12/25/2015   LDLDIRECT 108.0 04/16/2014   TRIG 190.0 (H) 12/25/2015   CHOLHDL 4 12/25/2015   on simvastatin - last eye exam was in 10/2015. Had DR (mild), but not at last check. + angle-closure glaucoma >> had surgery in both eyes >> had blurry vision L eye >> resolved.  - no numbness and tingling in his L foot. He had a foot exam 12/2014 (dorsum L hallux + fungal inf) - [facility] (Dr Amalia Hailey). Very mild PN in R foot.   He also has a history of hypertension and hyperlipidemia.  ROS: Constitutional: no weight gain/loss, no fatigue, no subjective hyperthermia/hypothermia Eyes: no blurry vision, no xerophthalmia ENT: no sore throat, no nodules palpated in throat, no dysphagia/odynophagia, no hoarseness Cardiovascular: no CP/SOB/palpitations/leg swelling Respiratory: no cough/SOB Gastrointestinal: no N/V/D/C Musculoskeletal: no muscle/joint aches Skin: no  rashes Neurological: no tremors/numbness/tingling/dizziness  I reviewed pt's medications, allergies, PMH, social hx, family hx, and changes were documented in the history of present illness. Otherwise, unchanged from my initial visit note:  Past Medical History:  Diagnosis Date  . CAD (coronary artery disease)    a. 05/2014 [place] s/p DES x4 to mid-distLAD and DESx1 to DI  . Complication of anesthesia    11'12 Surgery with excrutiating "head about to explode" upon awakening  -lasted several hours postop  ( SURGERY DONE 08-13-2011 AT [facility] PT DID OK POST OP)  . Diverticulosis of colon    LEFT SIDE  . History of small bowel obstruction    X2  28 YRS AGO AND LAST ONE 04/2014--   RESOLVED WITHOUT SURGICAL INTERVENTION  . Hyperlipidemia   . Hypertension   . Knee pain, bilateral 08/13/2011  . Left ureteral calculus   . Measles =  . Multiple lipomas    hx. mulitple and some remains -arms, legs  . Obesity, unspecified 09/18/2013  . Recurrent kidney stones 07/08/2013   Follows with Dr Gaynelle Arabian He believes they are Calcium oxalate stones  . Thrombocytopenia ([place]) 07/07/2014  . Type 2 diabetes mellitus ([place])   . Vitamin D deficiency 07/02/2014   Past Surgical History:  Procedure Laterality Date  . COLONOSCOPY W/ POLYPECTOMY  LAST ONE 2013  . CORONARY STENT PLACEMENT  05/17/2014   PROXIMAL X 4  MID & DISTAL LAD  DIAGONAL   . KNEE ARTHROSCOPY  08/13/2011   right ARTHROSCOPY KNEE;  Surgeon: Gearlean Alf, MD;  Location: [facility];  Service: Orthopedics;  Laterality: Right;  medial  meniscal debridement, excision of plica, synovectomy  . KNEE ARTHROSCOPY Left 11/16/2013   Procedure: LEFT KNEE ARTHROSCOPY MEDIAL MENISCAL DEBRIDEMENT, CHONDROPLASTY;  Surgeon: Gearlean Alf, MD;  Location: [facility];  Service: Orthopedics;  Laterality: Left;  . KNEE ARTHROSCOPY W/ MENISCECTOMY Right 01-29-2011   twice, first trimmed mediscus and cyst  . LEFT HEART CATHETERIZATION WITH CORONARY ANGIOGRAM N/A 05/17/2014   Procedure: LEFT HEART CATHETERIZATION WITH CORONARY ANGIOGRAM;  Surgeon: Burnell Blanks, MD;  Location: [facility] CATH LAB;  Service: Cardiovascular;  Laterality: N/A;  . [place]   multiple, 10-12  . PERCUTANEOUS CORONARY STENT INTERVENTION (PCI-S)  05/17/2014   Procedure: PERCUTANEOUS CORONARY STENT INTERVENTION (PCI-S);  Surgeon: Burnell Blanks, MD;  Location: [facility] CATH LAB;  Service: Cardiovascular;;  Diag1 and Prox to Distal LAD   History   Social History  . Marital Status: Married    Spouse Name: N/A  . Number of Children: 2   Social History Main Topics  . Smoking status: Former Smoker -- 1.00 packs/day for 4 years    Types: Cigarettes    Quit date:  06/14/1967  . Smokeless tobacco: Never Used  . Alcohol Use: No  . Drug Use: No  . 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     Current Outpatient Prescriptions on File Prior to Visit  Medication Sig Dispense Refill  . ACCU-CHEK FASTCLIX LANCETS MISC Use to test blood sugar 2 times daily. Dx: E11.40 204 each 3  . acetaminophen (TYLENOL) 500 MG tablet Take 1,000 mg by mouth every 6 (six) hours as needed for mild pain. Reported on 03/31/2015    . aspirin EC 81 MG tablet Take 81 mg by mouth daily with breakfast.     . cholecalciferol (VITAMIN D) 1000  UNITS tablet Take 5,000 Units by mouth daily.     [name] glucose blood (ACCU-CHEK AVIVA PLUS) test strip Use to test blood sugar 2 times daily. Dx: E11.40 200 each 3  . insulin NPH Human (HUMULIN N) 100 UNIT/ML injection INJECT UNDER SKIN 20 UNITS IN THE AM AND 42-46 UNITS AT BEDTIME. 20 mL 3  . insulin regular (HUMULIN R) 100 units/mL injection Inject 0.3-0.35 mLs (30-35 Units total) into the skin 3 (three) times daily before meals. 30 mL 3  . Insulin Syringe-Needle U-100 (TRUEPLUS INSULIN SYRINGE) 30G X 5/16" 0.5 ML MISC USE TO INJECT INSULIN 5 TIMES DAILY. 200 each 3  . losartan (COZAAR) 50 MG tablet TAKE 1&1/2 (75 MG TOTAL) TABLETS BY MOUTH EVERY MORNING 180 tablet 3  . metoprolol tartrate (LOPRESSOR) 25 MG tablet Take 1 tablet (25 mg total) by mouth 2 (two) times daily. 180 tablet 3  . Multiple Vitamin (MULTIVITAMIN WITH MINERALS) TABS tablet Take 1 tablet by mouth daily.    . nitroGLYCERIN (NITROSTAT) 0.4 MG SL tablet Place 1 tablet (0.4 mg total) under the tongue every 5 (five) minutes as needed for chest pain. 90 tablet 3  . Polyethylene Glycol 3350 (MIRALAX PO) Take by mouth as needed. Reported on 03/31/2015    . prasugrel (EFFIENT) 10 MG TABS tablet Take 1 tablet (10 mg total) by mouth daily. 90 tablet 3  . Probiotic Product (PROBIOTIC-10 PO) Take 1 tablet  by mouth daily.    . simvastatin (ZOCOR) 40 MG tablet TAKE 1 TABLET (40 MG) BY MOUTH AT BEDTIME. 90 tablet 2  . Vitamin D, Ergocalciferol, (DRISDOL) 50000 units CAPS capsule TAKE 1 CAPSULE BY MOUTH EVERY 7 DAYS FOR 12 WEEKS 12 capsule 1   No current facility-administered medications on file prior to visit.    Allergies  Allergen Reactions  . Ciprofloxacin Other (See Comments)    Abdominal pain  . Lipitor [Atorvastatin Calcium] Other (See Comments)    Severe muscle pain  . Lisinopril Cough  . Metformin And Related Diarrhea   Family History  Problem Relation Age of Onset  . Hypertension Mother   . Cancer Mother 7    ovarian  . Heart disease Father     CHF  . Heart attack Father 12  . Cancer Sister     breast- just finished her last radiation  . Heart disease Maternal Grandfather     ?  [name] Heart attack Paternal Grandfather 14  . Diabetes Neg Hx   . Heart attack Paternal Uncle 64    Sudden death   PE: BP 124/78 (BP Location: Left Arm, Patient Position: Sitting)   Pulse 62   Ht 5' 9.5" (1.765 m)   Wt 259 lb (117.5 kg)   SpO2 96%   BMI 37.70 kg/m  Body mass index is 37.7 kg/m.  Wt Readings from Last 3 Encounters:  12/31/15 259 lb (117.5 kg)  11/20/15 255 lb 14.4 oz (116.1 kg)  10/22/15 255 lb 6.4 oz (115.8 kg)   Constitutional: Obese, in NAD Eyes: PERRLA, EOMI, no exophthalmos ENT: moist mucous membranes, no thyromegaly, no cervical lymphadenopathy Cardiovascular: RRR, No MRG Respiratory: CTA B Gastrointestinal: abdomen soft, NT, ND, BS+ Musculoskeletal: no deformities, strength intact in all 4 Skin: moist, warm, + rash: Vitiligo on arms Neurological: no tremor with outstretched hands, DTR normal in all 4  ASSESSMENT: 1. DM2, insulin-dependent, uncontrolled, with complications - CAD, status post stent 05/2014 - Dr Percival Spanish - Peripheral neuropathy - DR  PLAN:  1. Patient with  long-standing, fairly well controlled diabetes. His fructosamine was at goal at last  visit. He brings a sugar log >> sugars in am are higher and the ones before lunch are lower >> will increase NPH at bedtime and decrease R insulin with b'fast. - I advised him to: Patient Instructions   Please change the insulin doses:  Insulin Before breakfast Before lunch Before dinner Bedtime  Regular 30 >> 26 35 35    NPH 20   42 >> 46   Please do not use more than 20 units of R insulin before a meal prior to exercise (rehab, working in the yard, etc.)  - I advised him to continue to check CBGs - ideally 3 times a day, rotating checks - advised for yearly eye exams >> He is UTD - UTD with flu shot - will not check a fructosamine today, last HbA1c was at target, 7.0% (previously the calculated HbA1c was lower than the measured one by ~1%) - Return to clinic in 3 mo with sugar log   Philemon Kingdom, MD PhD [facility] Endocr[facility]ology

## 2015-12-31 NOTE — Patient Instructions (Addendum)
Please change the insulin doses:  Insulin Before breakfast Before lunch Before dinner Bedtime  Regular 30 >> 26 35 35    NPH 20   42 >> 46   Please do not use more than 20 units of R insulin before a meal prior to exercise (rehab, working in the yard, etc.)

## 2016-01-01 ENCOUNTER — Ambulatory Visit (INDEPENDENT_AMBULATORY_CARE_PROVIDER_SITE_OTHER): Payer: Medicare Other | Admitting: Family Medicine

## 2016-01-01 ENCOUNTER — Encounter: Payer: Self-pay | Admitting: Family Medicine

## 2016-01-01 VITALS — BP 122/70 | HR 58 | Temp 97.7°F | Resp 16 | Ht 68.0 in | Wt 261.4 lb

## 2016-01-01 DIAGNOSIS — I10 Essential (primary) hypertension: Secondary | ICD-10-CM

## 2016-01-01 DIAGNOSIS — E559 Vitamin D deficiency, unspecified: Secondary | ICD-10-CM

## 2016-01-01 DIAGNOSIS — Z8719 Personal history of other diseases of the digestive system: Secondary | ICD-10-CM

## 2016-01-01 DIAGNOSIS — I2510 Atherosclerotic heart disease of native coronary artery without angina pectoris: Secondary | ICD-10-CM

## 2016-01-01 DIAGNOSIS — E1142 Type 2 diabetes mellitus with diabetic polyneuropathy: Secondary | ICD-10-CM | POA: Diagnosis not present

## 2016-01-01 DIAGNOSIS — M25541 Pain in joints of right hand: Secondary | ICD-10-CM

## 2016-01-01 DIAGNOSIS — G4733 Obstructive sleep apnea (adult) (pediatric): Secondary | ICD-10-CM

## 2016-01-01 DIAGNOSIS — E782 Mixed hyperlipidemia: Secondary | ICD-10-CM

## 2016-01-01 DIAGNOSIS — E669 Obesity, unspecified: Secondary | ICD-10-CM

## 2016-01-01 DIAGNOSIS — E1165 Type 2 diabetes mellitus with hyperglycemia: Secondary | ICD-10-CM

## 2016-01-01 DIAGNOSIS — M25542 Pain in joints of left hand: Secondary | ICD-10-CM

## 2016-01-01 HISTORY — DX: Obstructive sleep apnea (adult) (pediatric): G47.33

## 2016-01-01 MED ORDER — INSULIN NPH (HUMAN) (ISOPHANE) 100 UNIT/ML ~~LOC~~ SUSP: SUBCUTANEOUS | 3 refills | Status: DC

## 2016-01-01 MED ORDER — INSULIN REGULAR HUMAN 100 UNIT/ML IJ SOLN: 26.0000 [IU] | Freq: Three times a day (TID) | INTRAMUSCULAR | 3 refills | Status: DC

## 2016-01-01 NOTE — Assessment & Plan Note (Signed)
Well controlled, no changes to meds. Encouraged heart healthy diet such as the DASH diet and exercise as tolerated.  °

## 2016-01-01 NOTE — Assessment & Plan Note (Signed)
Using CPAP 

## 2016-01-01 NOTE — Progress Notes (Signed)
Pre visit review using our clinic review tool, if applicable. No additional management support is needed unless otherwise documented below in the visit note. 

## 2016-01-01 NOTE — Progress Notes (Signed)
Patient ID: Michael Cunningham, male   DOB: [birth year], 70 y.o.   MRN: [id number]    Subjective:    Patient ID: Michael Cunningham, male    DOB: [birth year], 70 y.o.   MRN: [id number]  Chief Complaint  Patient presents with  . Hypertension    follow up    HPI Patient is in today for Patient report adjusting his long acting from 42 unit to 46 unit at bedtime, and 26 -35 unit  x 3 day  Patient report he is tolerating the adjust medication for insulin well. [facility] report [facility] for his hands. Patient not to sure what is causing bilateral pain, edema, and stiffness. Questionable arthritis, blood work and x ray may confirm onset symptoms. Per provider patient's vit D is wnL. Patient will continue vit D at home, and follow up 4 weeks. [facility] recommend's patient to see an Cardiologist. Patient report seeing cardiologist, cardiologist recommends changing  symbastatin to pravastatin. Provider recommends Crestor due to less risks. He feels well. No recent hospitalization or acute illness. Denies CP/palp/SOB/HA/congestion/fevers/GI or GU c/o. Taking meds as prescribed   Past Medical History:  Diagnosis Date  . CAD (coronary artery disease)    a. 05/2014 [place] s/p DES x4 to mid-distLAD and DESx1 to DI  . Complication of anesthesia    11'12 Surgery with excrutiating "head about to explode" upon awakening  -lasted several hours postop  ( SURGERY DONE 08-13-2011 AT [facility] PT DID OK POST OP)  . Diverticulosis of colon    LEFT SIDE  . History of small bowel obstruction    X2  28 YRS AGO AND LAST ONE 04/2014--  RESOLVED WITHOUT SURGICAL INTERVENTION  . Hx of small bowel obstruction 06/14/2011   Follows with Dr Watt Climes   . Hyperlipidemia   . Hypertension   . Knee pain, bilateral 08/13/2011  . Left ureteral calculus   . Measles =  . Multiple lipomas    hx. mulitple and some remains -arms, legs  . Obesity, unspecified 09/18/2013  . Recurrent kidney stones 07/08/2013   Follows with Dr Gaynelle Arabian He believes they  are Calcium oxalate stones  . Thrombocytopenia ([place]) 07/07/2014  . Type 2 diabetes mellitus ([place])   . Vitamin D deficiency 07/02/2014    Past Surgical History:  Procedure Laterality Date  . COLONOSCOPY W/ POLYPECTOMY  LAST ONE 2013  . CORONARY STENT PLACEMENT  05/17/2014   PROXIMAL X 4  MID & DISTAL LAD  DIAGONAL   . KNEE ARTHROSCOPY  08/13/2011   right ARTHROSCOPY KNEE;  Surgeon: Gearlean Alf, MD;  Location: [facility];  Service: Orthopedics;  Laterality: Right;  medial  meniscal debridement, excision of plica, synovectomy  . KNEE ARTHROSCOPY Left 11/16/2013   Procedure: LEFT KNEE ARTHROSCOPY MEDIAL MENISCAL DEBRIDEMENT, CHONDROPLASTY;  Surgeon: Gearlean Alf, MD;  Location: [facility];  Service: Orthopedics;  Laterality: Left;  . KNEE ARTHROSCOPY W/ MENISCECTOMY Right 01-29-2011   twice, first trimmed mediscus and cyst  . LEFT HEART CATHETERIZATION WITH CORONARY ANGIOGRAM N/A 05/17/2014   Procedure: LEFT HEART CATHETERIZATION WITH CORONARY ANGIOGRAM;  Surgeon: Burnell Blanks, MD;  Location: [facility] CATH LAB;  Service: Cardiovascular;  Laterality: N/A;  . [place]   multiple, 10-12  . PERCUTANEOUS CORONARY STENT INTERVENTION (PCI-S)  05/17/2014   Procedure: PERCUTANEOUS CORONARY STENT INTERVENTION (PCI-S);  Surgeon: Burnell Blanks, MD;  Location: [facility] CATH LAB;  Service: Cardiovascular;;  Diag1 and Prox to Distal LAD    Family History  Problem Relation Age of Onset  . Hypertension Mother   . Cancer Mother 51    ovarian  . Heart disease Father     CHF  . Heart attack Father 24  . Cancer Sister     breast- just finished her last radiation  . Heart disease Maternal Grandfather     ?  [name] Heart attack Paternal Grandfather 66  . Diabetes Neg Hx   . Heart attack Paternal Uncle 90    Sudden death    Social History   Social History  . Marital status: Married    Spouse name: N/A  . Number of children: 2  . Years of education: N/A   Occupational History  . Not on  file.   Social History Main Topics  . Smoking status: Former Smoker    Packs/day: 1.00    Years: 4.00    Types: Cigarettes    Quit date: 06/14/1967  . Smokeless tobacco: Never Used  . Alcohol use No  . Drug use: No  . Sexual activity: Yes     Comment: lives with wife   Other Topics Concern  . 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      Outpatient Medications Prior to Visit  Medication Sig Dispense Refill  . ACCU-CHEK FASTCLIX LANCETS MISC Use to test blood sugar 2 times daily. Dx: E11.40 204 each 3  . acetaminophen (TYLENOL) 500 MG tablet Take 1,000 mg by mouth every 6 (six) hours as needed for mild pain. Reported on 03/31/2015    . aspirin EC 81 MG tablet Take 81 mg by mouth daily with breakfast.     . cholecalciferol (VITAMIN D) 1000 UNITS tablet Take 5,000 Units by mouth daily.     [name] glucose blood (ACCU-CHEK AVIVA PLUS) test strip Use to test blood sugar 2 times daily. Dx: E11.40 200 each 3  . insulin NPH Human (HUMULIN N) 100 UNIT/ML injection INJECT UNDER SKIN 20 UNITS IN THE AM AND 42-46 UNITS AT BEDTIME. 20 mL 3  . insulin regular (HUMULIN R) 100 units/mL injection Inject 0.3-0.35 mLs (30-35 Units total) into the skin 3 (three) times daily before meals. 30 mL 3  . Insulin Syringe-Needle U-100 (TRUEPLUS INSULIN SYRINGE) 30G X 5/16" 0.5 ML MISC USE TO INJECT INSULIN 5 TIMES DAILY. 200 each 3  . losartan (COZAAR) 50 MG tablet TAKE 1&1/2 (75 MG TOTAL) TABLETS BY MOUTH EVERY MORNING 180 tablet 3  . metoprolol tartrate (LOPRESSOR) 25 MG tablet Take 1 tablet (25 mg total) by mouth 2 (two) times daily. 180 tablet 3  . Multiple Vitamin (MULTIVITAMIN WITH MINERALS) TABS tablet Take 1 tablet by mouth daily.    . nitroGLYCERIN (NITROSTAT) 0.4 MG SL tablet Place 1 tablet (0.4 mg total) under the tongue every 5 (five) minutes as needed for chest pain. 90 tablet 3  . Polyethylene Glycol 3350 (MIRALAX PO) Take by mouth as needed. Reported on  03/31/2015    . prasugrel (EFFIENT) 10 MG TABS tablet Take 1 tablet (10 mg total) by mouth daily. 90 tablet 3  . Probiotic Product (PROBIOTIC-10 PO) Take 1 tablet by mouth daily.    . simvastatin (ZOCOR) 40 MG tablet TAKE 1 TABLET (40 MG) BY MOUTH AT BEDTIME. 90 tablet 2  . Vitamin D, Ergocalciferol, (DRISDOL) 50000 units CAPS capsule TAKE 1 CAPSULE BY MOUTH EVERY 7 DAYS FOR 12 WEEKS 12 capsule 1   No facility-administered medications prior to visit.  Allergies  Allergen Reactions  . Ciprofloxacin Other (See Comments)    Abdominal pain  . Lipitor [Atorvastatin Calcium] Other (See Comments)    Severe muscle pain  . Lisinopril Cough  . Metformin And Related Diarrhea    Review of Systems  Constitutional: Negative for fever.  Eyes: Negative for blurred vision.  Respiratory: Negative for cough.   Cardiovascular: Negative for chest pain and palpitations.  Gastrointestinal: Negative for vomiting.  Musculoskeletal: Negative for back pain.  Skin: Negative for rash.  Neurological: Negative for loss of consciousness and headaches.       Objective:    Physical Exam  Constitutional: He appears well-developed and well-nourished.  HENT:  Head: Normocephalic.    BP 122/70 (BP Location: Left Arm, Patient Position: Sitting, Cuff Size: Large)   Pulse (!) 58   Temp 97.7 F (36.5 C) (Oral)   Resp 16   Ht 5\' 8"  (1.727 m)   Wt 261 lb 6.4 oz (118.6 kg)   SpO2 96%   BMI 39.75 kg/m  Wt Readings from Last 3 Encounters:  01/01/16 261 lb 6.4 oz (118.6 kg)  12/31/15 259 lb (117.5 kg)  11/20/15 255 lb 14.4 oz (116.1 kg)     Lab Results  Component Value Date   WBC 5.8 12/25/2015   HGB 14.9 12/25/2015   HCT 43.7 12/25/2015   PLT 167.0 12/25/2015   GLUCOSE 175 (H) 12/25/2015   CHOL 144 12/25/2015   TRIG 190.0 (H) 12/25/2015   HDL 34.10 (L) 12/25/2015   LDLDIRECT 108.0 04/16/2014   LDLCALC 72 12/25/2015   ALT 23 12/25/2015   AST 19 12/25/2015   NA 138 12/25/2015   K 4.8  12/25/2015   CL 102 12/25/2015   CREATININE 0.88 12/25/2015   BUN 14 12/25/2015   CO2 29 12/25/2015   TSH 2.12 12/25/2015   PSA 1.24 07/24/2014   INR 1.02 05/17/2014   HGBA1C 7.0 (H) 12/25/2015   MICROALBUR 1.7 06/24/2015    Lab Results  Component Value Date   TSH 2.12 12/25/2015   Lab Results  Component Value Date   WBC 5.8 12/25/2015   HGB 14.9 12/25/2015   HCT 43.7 12/25/2015   MCV 91.5 12/25/2015   PLT 167.0 12/25/2015   Lab Results  Component Value Date   NA 138 12/25/2015   K 4.8 12/25/2015   CO2 29 12/25/2015   GLUCOSE 175 (H) 12/25/2015   BUN 14 12/25/2015   CREATININE 0.88 12/25/2015   BILITOT 0.8 12/25/2015   ALKPHOS 75 12/25/2015   AST 19 12/25/2015   ALT 23 12/25/2015   PROT 7.1 12/25/2015   ALBUMIN 3.9 12/25/2015   CALCIUM 9.3 12/25/2015   ANIONGAP 8 05/18/2014   GFR 90.61 12/25/2015   Lab Results  Component Value Date   CHOL 144 12/25/2015   Lab Results  Component Value Date   HDL 34.10 (L) 12/25/2015   Lab Results  Component Value Date   LDLCALC 72 12/25/2015   Lab Results  Component Value Date   TRIG 190.0 (H) 12/25/2015   Lab Results  Component Value Date   CHOLHDL 4 12/25/2015   Lab Results  Component Value Date   HGBA1C 7.0 (H) 12/25/2015       Assessment & Plan:   Problem List Items Addressed This Visit      Other   Hx of small bowel obstruction    No recent concerns follows with gastroenterology       Other Visit Diagnoses   None.  I am having Mr. Cronan maintain his aspirin EC, multivitamin with minerals, acetaminophen, nitroGLYCERIN, cholecalciferol, Polyethylene Glycol 3350 (MIRALAX PO), prasugrel, losartan, metoprolol tartrate, ACCU-CHEK FASTCLIX LANCETS, glucose blood, simvastatin, Insulin Syringe-Needle U-100, insulin NPH Human, insulin regular, Probiotic Product (PROBIOTIC-10 PO), and Vitamin D (Ergocalciferol).  No orders of the defined types were placed in this encounter.    Rip Harbour,  LPN

## 2016-01-01 NOTE — Patient Instructions (Signed)
Hypertension Hypertension, commonly called high blood pressure, is when the force of blood pumping through your arteries is too strong. Your arteries are the blood vessels that carry blood from your heart throughout your body. A blood pressure reading consists of a higher number over a lower number, such as 110/72. The higher number (systolic) is the pressure inside your arteries when your heart pumps. The lower number (diastolic) is the pressure inside your arteries when your heart relaxes. Ideally you want your blood pressure below 120/80. Hypertension forces your heart to work harder to pump blood. Your arteries may become narrow or stiff. Having untreated or uncontrolled hypertension can cause heart attack, stroke, kidney disease, and other problems. RISK FACTORS Some risk factors for high blood pressure are controllable. Others are not.  Risk factors you cannot control include:   Race. You may be at higher risk if you are African American.  Age. Risk increases with age.  Gender. Men are at higher risk than women before age 45 years. After age 65, women are at higher risk than men. Risk factors you can control include:  Not getting enough exercise or physical activity.  Being overweight.  Getting too much fat, sugar, calories, or salt in your diet.  Drinking too much alcohol. SIGNS AND SYMPTOMS Hypertension does not usually cause signs or symptoms. Extremely high blood pressure (hypertensive crisis) may cause headache, anxiety, shortness of breath, and nosebleed. DIAGNOSIS To check if you have hypertension, your health care provider will measure your blood pressure while you are seated, with your arm held at the level of your heart. It should be measured at least twice using the same arm. Certain conditions can cause a difference in blood pressure between your right and left arms. A blood pressure reading that is higher than normal on one occasion does not mean that you need treatment. If  it is not clear whether you have high blood pressure, you may be asked to return on a different day to have your blood pressure checked again. Or, you may be asked to monitor your blood pressure at home for 1 or more weeks. TREATMENT Treating high blood pressure includes making lifestyle changes and possibly taking medicine. Living a healthy lifestyle can help lower high blood pressure. You may need to change some of your habits. Lifestyle changes may include:  Following the DASH diet. This diet is high in fruits, vegetables, and whole grains. It is low in salt, red meat, and added sugars.  Keep your sodium intake below 2,300 mg per day.  Getting at least 30-45 minutes of aerobic exercise at least 4 times per week.  Losing weight if necessary.  Not smoking.  Limiting alcoholic beverages.  Learning ways to reduce stress. Your health care provider may prescribe medicine if lifestyle changes are not enough to get your blood pressure under control, and if one of the following is true:  You are 18-59 years of age and your systolic blood pressure is above 140.  You are 60 years of age or older, and your systolic blood pressure is above 150.  Your diastolic blood pressure is above 90.  You have diabetes, and your systolic blood pressure is over 140 or your diastolic blood pressure is over 90.  You have kidney disease and your blood pressure is above 140/90.  You have heart disease and your blood pressure is above 140/90. Your personal target blood pressure may vary depending on your medical conditions, your age, and other factors. HOME CARE INSTRUCTIONS    Have your blood pressure rechecked as directed by your health care provider.   Take medicines only as directed by your health care provider. Follow the directions carefully. Blood pressure medicines must be taken as prescribed. The medicine does not work as well when you skip doses. Skipping doses also puts you at risk for  problems.  Do not smoke.   Monitor your blood pressure at home as directed by your health care provider. SEEK MEDICAL CARE IF:   You think you are having a reaction to medicines taken.  You have recurrent headaches or feel dizzy.  You have swelling in your ankles.  You have trouble with your vision. SEEK IMMEDIATE MEDICAL CARE IF:  You develop a severe headache or confusion.  You have unusual weakness, numbness, or feel faint.  You have severe chest or abdominal pain.  You vomit repeatedly.  You have trouble breathing. MAKE SURE YOU:   Understand these instructions.  Will watch your condition.  Will get help right away if you are not doing well or get worse.   This information is not intended to replace advice given to you by your health care provider. Make sure you discuss any questions you have with your health care provider.   Document Released: 03/01/2005 Document Revised: 07/16/2014 Document Reviewed: 12/22/2012 Elsevier Interactive Patient Education 2016 Elsevier Inc.  

## 2016-01-01 NOTE — Assessment & Plan Note (Signed)
No recent concerns follows with gastroenterology

## 2016-01-01 NOTE — Assessment & Plan Note (Signed)
WNL will drop the 50000 IU weekly and continue the daily 5000 IU daily recheck in 4 months

## 2016-01-02 ENCOUNTER — Ambulatory Visit: Payer: Medicare Other | Admitting: Internal Medicine

## 2016-01-02 DIAGNOSIS — M25542 Pain in joints of left hand: Secondary | ICD-10-CM

## 2016-01-02 DIAGNOSIS — M25541 Pain in joints of right hand: Secondary | ICD-10-CM | POA: Insufficient documentation

## 2016-01-02 NOTE — Assessment & Plan Note (Signed)
He believes it started when he started the Vitamin D will drop the weekly dose and monitor is being worked up at the [place] with [place] and they are going to try Paraffin treatments.

## 2016-01-02 NOTE — Assessment & Plan Note (Signed)
Encouraged DASH diet, decrease po intake and increase exercise as tolerated. Needs 7-8 hours of sleep nightly. Avoid trans fats, eat small, frequent meals every 4-5 hours with lean proteins, complex carbs and healthy fats. Minimize simple carbs, GMO foods. 

## 2016-01-02 NOTE — Assessment & Plan Note (Signed)
Doing well, numbers improving with endocrinology care and recent changes. hgba1c acceptable, minimize simple carbs. Increase exercise as tolerated. Continue current meds

## 2016-01-02 NOTE — Assessment & Plan Note (Signed)
Tolerating statin, encouraged heart healthy diet, avoid trans fats, minimize simple carbs and saturated fats. Increase exercise as tolerated 

## 2016-02-17 ENCOUNTER — Telehealth: Payer: Self-pay | Admitting: *Deleted

## 2016-02-17 ENCOUNTER — Encounter: Payer: Self-pay | Admitting: *Deleted

## 2016-02-17 NOTE — Telephone Encounter (Signed)
Called patient and left message to return call to schedule AWV w/ [facility]. MyChart message sent to pt to schedule AWV as well.

## 2016-02-25 ENCOUNTER — Encounter: Payer: Self-pay | Admitting: Podiatry

## 2016-02-25 ENCOUNTER — Ambulatory Visit (INDEPENDENT_AMBULATORY_CARE_PROVIDER_SITE_OTHER): Payer: Medicare Other | Admitting: Podiatry

## 2016-02-25 VITALS — BP 148/75 | HR 67 | Resp 18

## 2016-02-25 DIAGNOSIS — M79676 Pain in unspecified toe(s): Secondary | ICD-10-CM | POA: Diagnosis not present

## 2016-02-25 DIAGNOSIS — B351 Tinea unguium: Secondary | ICD-10-CM

## 2016-02-25 NOTE — Progress Notes (Signed)
Patient ID: Rainer M Bones, male   DOB: 06/07/1945, 70 y.o.   MRN: [id number]      Subjective: This patient presents today complaining of elongated and thickened toenails for term comfortable walking wearing shoes and requests nail debridement  Objective: Orientated 3 Mild nonpitting peripheral edema bilaterally DP and PT pulses 2/4 bilaterally Capillary reflex immediate bilaterally Sensation to 10 g monofilament wire intact 5/5 bilaterally Vibratory sensation reactive bilaterally Ankle reflex equal and reactive bilaterally No open skin lesions bilaterally The toenails are elongated, discolored, hypertrophic, deformed and tender to direct palpation 6-10 Manual motor testing dorsi flexion, plantar flexion, inversion, eversion 5/5 bilaterally There is no restriction in the ankle, subtalar, midtarsal joints bilaterally  Assessment: Type II diabetic with satisfactory neurovascular status Symptomatic onychomycoses 6-10  Plan: Debridement toenails 6-10 mechanically an electrical without any bleeding  Reappoint 3 months 

## 2016-02-25 NOTE — Patient Instructions (Signed)

## 2016-02-26 ENCOUNTER — Telehealth: Payer: Self-pay | Admitting: Family Medicine

## 2016-02-26 NOTE — Telephone Encounter (Signed)
Second call to patient regarding annual wellness visit. Left message for patient to call office to schedule appt.

## 2016-03-17 ENCOUNTER — Ambulatory Visit: Payer: Medicare Other | Admitting: Podiatry

## 2016-03-21 DIAGNOSIS — G4733 Obstructive sleep apnea (adult) (pediatric): Secondary | ICD-10-CM | POA: Diagnosis not present

## 2016-04-01 ENCOUNTER — Ambulatory Visit: Payer: Medicare Other | Admitting: Internal Medicine

## 2016-04-15 ENCOUNTER — Telehealth: Payer: Self-pay | Admitting: *Deleted

## 2016-04-15 NOTE — Telephone Encounter (Signed)
PAP mask interface full face orders faxed

## 2016-04-21 DIAGNOSIS — G4733 Obstructive sleep apnea (adult) (pediatric): Secondary | ICD-10-CM | POA: Diagnosis not present

## 2016-04-23 ENCOUNTER — Other Ambulatory Visit (INDEPENDENT_AMBULATORY_CARE_PROVIDER_SITE_OTHER): Payer: Medicare HMO

## 2016-04-23 DIAGNOSIS — M25542 Pain in joints of left hand: Secondary | ICD-10-CM | POA: Diagnosis not present

## 2016-04-23 DIAGNOSIS — I10 Essential (primary) hypertension: Secondary | ICD-10-CM

## 2016-04-23 DIAGNOSIS — M25541 Pain in joints of right hand: Secondary | ICD-10-CM

## 2016-04-23 DIAGNOSIS — E1142 Type 2 diabetes mellitus with diabetic polyneuropathy: Secondary | ICD-10-CM

## 2016-04-23 DIAGNOSIS — E1165 Type 2 diabetes mellitus with hyperglycemia: Secondary | ICD-10-CM | POA: Diagnosis not present

## 2016-04-23 LAB — COMPREHENSIVE METABOLIC PANEL
ALT: 24 U/L (ref 0–53)
AST: 20 U/L (ref 0–37)
Albumin: 4.2 g/dL (ref 3.5–5.2)
Alkaline Phosphatase: 77 U/L (ref 39–117)
BUN: 14 mg/dL (ref 6–23)
CO2: 28 meq/L (ref 19–32)
Calcium: 9.1 mg/dL (ref 8.4–10.5)
Chloride: 102 mEq/L (ref 96–112)
Creatinine, Ser: 0.9 mg/dL (ref 0.40–1.50)
GFR: 88.21 mL/min (ref >60.00)
GLUCOSE: 169 mg/dL — AB (ref 70–99)
POTASSIUM: 4.7 meq/L (ref 3.5–5.1)
SODIUM: 137 meq/L (ref 135–145)
TOTAL PROTEIN: 6.8 g/dL (ref 6.0–8.3)
Total Bilirubin: 0.7 mg/dL (ref 0.2–1.2)

## 2016-04-23 LAB — URIC ACID: Uric Acid, Serum: 5.2 mg/dL (ref 4.0–7.8)

## 2016-04-23 LAB — CBC
HEMATOCRIT: 43 % (ref 39.0–52.0)
HEMOGLOBIN: 14.5 g/dL (ref 13.0–17.0)
MCHC: 33.8 g/dL (ref 30.0–36.0)
MCV: 93.2 fl (ref 78.0–100.0)
PLATELETS: 169 10*3/uL (ref 150.0–400.0)
RBC: 4.61 Mil/uL (ref 4.22–5.81)
RDW: 13.8 % (ref 11.5–15.5)
WBC: 5.7 10*3/uL (ref 4.0–10.5)

## 2016-04-23 LAB — HEMOGLOBIN A1C: Hgb A1c MFr Bld: 7.3 % — ABNORMAL HIGH (ref 4.6–6.5)

## 2016-04-23 LAB — TSH: TSH: 2.31 u[IU]/mL (ref 0.35–4.50)

## 2016-04-26 ENCOUNTER — Encounter: Payer: Self-pay | Admitting: Family Medicine

## 2016-04-27 ENCOUNTER — Other Ambulatory Visit: Payer: Medicare Other

## 2016-05-04 ENCOUNTER — Ambulatory Visit (INDEPENDENT_AMBULATORY_CARE_PROVIDER_SITE_OTHER): Payer: Medicare HMO | Admitting: Family Medicine

## 2016-05-04 ENCOUNTER — Encounter: Payer: Self-pay | Admitting: Family Medicine

## 2016-05-04 VITALS — BP 123/52 | HR 61 | Temp 97.8°F | Resp 18 | Wt 259.4 lb

## 2016-05-04 DIAGNOSIS — E669 Obesity, unspecified: Secondary | ICD-10-CM

## 2016-05-04 DIAGNOSIS — M25542 Pain in joints of left hand: Secondary | ICD-10-CM

## 2016-05-04 DIAGNOSIS — I10 Essential (primary) hypertension: Secondary | ICD-10-CM

## 2016-05-04 DIAGNOSIS — E1165 Type 2 diabetes mellitus with hyperglycemia: Secondary | ICD-10-CM

## 2016-05-04 DIAGNOSIS — E1142 Type 2 diabetes mellitus with diabetic polyneuropathy: Secondary | ICD-10-CM

## 2016-05-04 DIAGNOSIS — E782 Mixed hyperlipidemia: Secondary | ICD-10-CM

## 2016-05-04 DIAGNOSIS — E559 Vitamin D deficiency, unspecified: Secondary | ICD-10-CM | POA: Diagnosis not present

## 2016-05-04 DIAGNOSIS — M25541 Pain in joints of right hand: Secondary | ICD-10-CM

## 2016-05-04 DIAGNOSIS — D696 Thrombocytopenia, unspecified: Secondary | ICD-10-CM

## 2016-05-04 DIAGNOSIS — G4733 Obstructive sleep apnea (adult) (pediatric): Secondary | ICD-10-CM

## 2016-05-04 NOTE — Assessment & Plan Note (Signed)
hgba1c acceptable, minimize simple carbs. Increase exercise as tolerated. Continue current meds 

## 2016-05-04 NOTE — Assessment & Plan Note (Signed)
Asymptomatic continue to monitor

## 2016-05-04 NOTE — Assessment & Plan Note (Signed)
Encouraged DASH diet, decrease po intake and increase exercise as tolerated. Needs 7-8 hours of sleep nightly. Avoid trans fats, eat small, frequent meals every 4-5 hours with lean proteins, complex carbs and healthy fats. Minimize simple carbs, referred to bariatric program 

## 2016-05-04 NOTE — Assessment & Plan Note (Signed)
Uses CPAP machine managed by cardiology, Dr Claiborne Billings

## 2016-05-04 NOTE — Progress Notes (Signed)
Patient ID: Michael Cunningham, male   DOB: [birth year], 71 y.o.   MRN: [id number]   Subjective:    Patient ID: Michael Cunningham, male    DOB: December 27, 1945, 71 y.o.   MRN: [id number] I acted as a Education administrator for Dr. Charlett Blake. [place], [place]  Chief Complaint  Patient presents with  . Follow-up  . Hypertension  . Diabetes    Hypertension  This is a recurrent problem. The problem has been gradually improving since onset. The problem is controlled. Pertinent negatives include no blurred vision, chest pain, headaches, malaise/fatigue, palpitations or shortness of breath.  Diabetes  He presents for his follow-up diabetic visit. Pertinent negatives for hypoglycemia include no headaches. Pertinent negatives for diabetes include no blurred vision and no chest pain.    Patient is in today for follow up for hypertension, diabetes and other medical concerns. Patient has no acute concerns. He has been trying to eat well with less carbs but due to his knee and hand pain is not exercising regularly. No recent febrile illness or hospitalizations. Denies CP/palp/SOB/HA/congestion/fevers/GI or GU c/o. Taking meds as prescribed  Past Medical History:  Diagnosis Date  . CAD (coronary artery disease)    a. 05/2014 [place] s/p DES x4 to mid-distLAD and DESx1 to DI  . Complication of anesthesia    11'12 Surgery with excrutiating "head about to explode" upon awakening  -lasted several hours postop  ( SURGERY DONE 08-13-2011 AT [facility] PT DID OK POST OP)  . Diverticulosis of colon    LEFT SIDE  . History of small bowel obstruction    X2  28 YRS AGO AND LAST ONE 04/2014--  RESOLVED WITHOUT SURGICAL INTERVENTION  . Hx of small bowel obstruction 06/14/2011   Follows with Dr Watt Climes   . Hyperlipidemia   . Hypertension   . Knee pain, bilateral 08/13/2011  . Left ureteral calculus   . Measles =  . Multiple lipomas    hx. mulitple and some remains -arms, legs  . Obesity, unspecified 09/18/2013  . Obstructive sleep apnea 01/01/2016  . Recurrent  kidney stones 07/08/2013   Follows with Dr Gaynelle Arabian He believes they are Calcium oxalate stones  . Thrombocytopenia ([place]) 07/07/2014  . Type 2 diabetes mellitus ([place])   . Vitamin D deficiency 07/02/2014    Past Surgical History:  Procedure Laterality Date  . COLONOSCOPY W/ POLYPECTOMY  LAST ONE 2013  . CORONARY STENT PLACEMENT  05/17/2014   PROXIMAL X 4  MID & DISTAL LAD  DIAGONAL   . KNEE ARTHROSCOPY  08/13/2011   right ARTHROSCOPY KNEE;  Surgeon: Gearlean Alf, MD;  Location: [facility];  Service: Orthopedics;  Laterality: Right;  medial  meniscal debridement, excision of plica, synovectomy  . KNEE ARTHROSCOPY Left 11/16/2013   Procedure: LEFT KNEE ARTHROSCOPY MEDIAL MENISCAL DEBRIDEMENT, CHONDROPLASTY;  Surgeon: Gearlean Alf, MD;  Location: [facility];  Service: Orthopedics;  Laterality: Left;  . KNEE ARTHROSCOPY W/ MENISCECTOMY Right 01-29-2011   twice, first trimmed mediscus and cyst  . LEFT HEART CATHETERIZATION WITH CORONARY ANGIOGRAM N/A 05/17/2014   Procedure: LEFT HEART CATHETERIZATION WITH CORONARY ANGIOGRAM;  Surgeon: Burnell Blanks, MD;  Location[facility] CATH LAB;  Service: Cardiovascular;  Laterality: N/A;  . [place]   multiple, 10-12  . PERCUTANEOUS CORONARY STENT INTERVENTION (PCI-S)  05/17/2014   Procedure: PERCUTANEOUS CORONARY STENT INTERVENTION (PCI-S);  Surgeon: Burnell Blanks, MD;  Location: [facility] CATH LAB;  Service: Cardiovascular;;  Diag1 and Prox to Distal LAD  Family History  Problem Relation Age of Onset  . Hypertension Mother   . Cancer Mother 29    ovarian  . Heart disease Father     CHF  . Heart attack Father 85  . Cancer Sister     breast- just finished her last radiation  . Heart disease Maternal Grandfather     ?  [name] Heart attack Paternal Grandfather 69  . Diabetes Neg Hx   . Heart attack Paternal Uncle 77    Sudden death    Social History   Social History  . Marital status: Married    Spouse name: N/A  . Number of  children: 2  . Years of education: N/A   Occupational History  . Not on file.   Social History Main Topics  . Smoking status: Former Smoker    Packs/day: 1.00    Years: 4.00    Types: Cigarettes    Quit date: 06/14/1967  . Smokeless tobacco: Never Used  . Alcohol use No  . Drug use: No  . Sexual activity: Yes     Comment: lives with wife   Other Topics Concern  . 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      Outpatient Medications Prior to Visit  Medication Sig Dispense Refill  . ACCU-CHEK FASTCLIX LANCETS MISC Use to test blood sugar 2 times daily. Dx: E11.40 204 each 3  . acetaminophen (TYLENOL) 500 MG tablet Take 1,000 mg by mouth every 6 (six) hours as needed for mild pain. Reported on 03/31/2015    . aspirin EC 81 MG tablet Take 81 mg by mouth daily with breakfast.     . glucose blood (ACCU-CHEK AVIVA PLUS) test strip Use to test blood sugar 2 times daily. Dx: E11.40 200 each 3  . insulin NPH Human (HUMULIN N) 100 UNIT/ML injection INJECT UNDER SKIN 20 UNITS IN THE AM AND 46 UNITS AT BEDTIME. 20 mL 3  . insulin regular (HUMULIN R) 100 units/mL injection Inject 0.26-0.35 mLs (26-35 Units total) into the skin 3 (three) times daily before meals. 30 mL 3  . Insulin Syringe-Needle U-100 (TRUEPLUS INSULIN SYRINGE) 30G X 5/16" 0.5 ML MISC USE TO INJECT INSULIN 5 TIMES DAILY. 200 each 3  . losartan (COZAAR) 50 MG tablet TAKE 1&1/2 (75 MG TOTAL) TABLETS BY MOUTH EVERY MORNING 180 tablet 3  . metoprolol tartrate (LOPRESSOR) 25 MG tablet Take 1 tablet (25 mg total) by mouth 2 (two) times daily. 180 tablet 3  . Multiple Vitamin (MULTIVITAMIN WITH MINERALS) TABS tablet Take 1 tablet by mouth daily.    . nitroGLYCERIN (NITROSTAT) 0.4 MG SL tablet Place 1 tablet (0.4 mg total) under the tongue every 5 (five) minutes as needed for chest pain. 90 tablet 3  . Polyethylene Glycol 3350 (MIRALAX PO) Take by mouth as needed. Reported on 03/31/2015     . prasugrel (EFFIENT) 10 MG TABS tablet Take 1 tablet (10 mg total) by mouth daily. 90 tablet 3  . Probiotic Product (PROBIOTIC-10 PO) Take 1 tablet by mouth daily.    . simvastatin (ZOCOR) 40 MG tablet TAKE 1 TABLET (40 MG) BY MOUTH AT BEDTIME. 90 tablet 2  . Vitamin D, Ergocalciferol, (DRISDOL) 50000 units CAPS capsule TAKE 1 CAPSULE BY MOUTH EVERY 7 DAYS FOR 12 WEEKS 12 capsule 1  . cholecalciferol (VITAMIN D) 1000 UNITS tablet Take 5,000 Units by mouth daily.      No facility-administered medications prior  to visit.     Allergies  Allergen Reactions  . Ciprofloxacin Other (See Comments)    Abdominal pain  . Lipitor [Atorvastatin Calcium] Other (See Comments)    Severe muscle pain  . Lisinopril Cough  . Metformin And Related Diarrhea    Review of Systems  Constitutional: Negative for fever and malaise/fatigue.  HENT: Negative for congestion.   Eyes: Negative for blurred vision.  Respiratory: Negative for cough and shortness of breath.   Cardiovascular: Negative for chest pain, palpitations and leg swelling.  Gastrointestinal: Negative for vomiting.  Musculoskeletal: Positive for joint pain and myalgias. Negative for back pain.  Skin: Negative for rash.  Neurological: Negative for loss of consciousness and headaches.       Objective:    Physical Exam  Constitutional: He is oriented to person, place, and time. He appears well-developed and well-nourished. No distress.  HENT:  Head: Normocephalic and atraumatic.  Eyes: Conjunctivae are normal.  Neck: Normal range of motion. No thyromegaly present.  Cardiovascular: Normal rate and regular rhythm.   Pulmonary/Chest: Effort normal and breath sounds normal. He has no wheezes.  Abdominal: Soft. Bowel sounds are normal. There is no tenderness.  Musculoskeletal: Normal range of motion. He exhibits no edema or deformity.  Neurological: He is alert and oriented to person, place, and time.  Skin: Skin is warm and dry. He is not  diaphoretic.  Psychiatric: He has a normal mood and affect.    BP (!) 123/52 (BP Location: Left Arm, Patient Position: Sitting, Cuff Size: Normal)   Pulse 61   Temp 97.8 F (36.6 C) (Oral)   Resp 18   Wt 259 lb 6.4 oz (117.7 kg)   SpO2 98%   BMI 39.44 kg/m  Wt Readings from Last 3 Encounters:  05/04/16 259 lb 6.4 oz (117.7 kg)  01/01/16 261 lb 6.4 oz (118.6 kg)  12/31/15 259 lb (117.5 kg)     Lab Results  Component Value Date   WBC 5.7 04/23/2016   HGB 14.5 04/23/2016   HCT 43.0 04/23/2016   PLT 169.0 04/23/2016   GLUCOSE 169 (H) 04/23/2016   CHOL 144 12/25/2015   TRIG 190.0 (H) 12/25/2015   HDL 34.10 (L) 12/25/2015   LDLDIRECT 108.0 04/16/2014   LDLCALC 72 12/25/2015   ALT 24 04/23/2016   AST 20 04/23/2016   NA 137 04/23/2016   K 4.7 04/23/2016   CL 102 04/23/2016   CREATININE 0.90 04/23/2016   BUN 14 04/23/2016   CO2 28 04/23/2016   TSH 2.31 04/23/2016   PSA 1.24 07/24/2014   INR 1.02 05/17/2014   HGBA1C 7.3 (H) 04/23/2016   MICROALBUR 1.7 06/24/2015    Lab Results  Component Value Date   TSH 2.31 04/23/2016   Lab Results  Component Value Date   WBC 5.7 04/23/2016   HGB 14.5 04/23/2016   HCT 43.0 04/23/2016   MCV 93.2 04/23/2016   PLT 169.0 04/23/2016   Lab Results  Component Value Date   NA 137 04/23/2016   K 4.7 04/23/2016   CO2 28 04/23/2016   GLUCOSE 169 (H) 04/23/2016   BUN 14 04/23/2016   CREATININE 0.90 04/23/2016   BILITOT 0.7 04/23/2016   ALKPHOS 77 04/23/2016   AST 20 04/23/2016   ALT 24 04/23/2016   PROT 6.8 04/23/2016   ALBUMIN 4.2 04/23/2016   CALCIUM 9.1 04/23/2016   ANIONGAP 8 05/18/2014   GFR 88.21 04/23/2016   Lab Results  Component Value Date   CHOL 144 12/25/2015  Lab Results  Component Value Date   HDL 34.10 (L) 12/25/2015   Lab Results  Component Value Date   LDLCALC 72 12/25/2015   Lab Results  Component Value Date   TRIG 190.0 (H) 12/25/2015   Lab Results  Component Value Date   CHOLHDL 4  12/25/2015   Lab Results  Component Value Date   HGBA1C 7.3 (H) 04/23/2016       Assessment & Plan:   Problem List Items Addressed This Visit    Hypertension (Chronic)    Well controlled, no changes to meds. Encouraged heart healthy diet such as the DASH diet and exercise as tolerated.       Relevant Orders   CBC   Comprehensive metabolic panel   TSH   Hyperlipidemia    Encouraged heart healthy diet, increase exercise, avoid trans fats, consider a krill oil cap daily      Relevant Orders   Lipid panel   Obesity    Encouraged DASH diet, decrease po intake and increase exercise as tolerated. Needs 7-8 hours of sleep nightly. Avoid trans fats, eat small, frequent meals every 4-5 hours with lean proteins, complex carbs and healthy fats. Minimize simple carbs, referred to bariatric program      Vitamin D deficiency    Continue supplements and recheck with next blood draw      Relevant Orders   VITAMIN D 25 Hydroxy (Vit-D Deficiency, Fractures)   Thrombocytopenia (HCC)    Asymptomatic continue to monitor      Poorly controlled type 2 diabetes mellitus with peripheral neuropathy ([place]) - Primary    hgba1c acceptable, minimize simple carbs. Increase exercise as tolerated. Continue current meds      Relevant Orders   Hemoglobin A1c   Obstructive sleep apnea    Uses CPAP machine managed by cardiology, Dr Claiborne Billings      Arthralgia of both hands    Uric acid WNL. Still having struggles with swelling, stiffness and pain in b/l hands. Consider referral to hand specialist. He is going to explore his options at the [place] and let us know if he would like a referral to Dr Amedeo Plenty.         I have discontinued Mr. Dominski's cholecalciferol. I am also having him maintain his aspirin EC, multivitamin with minerals, acetaminophen, nitroGLYCERIN, Polyethylene Glycol 3350 (MIRALAX PO), prasugrel, losartan, metoprolol tartrate, ACCU-CHEK FASTCLIX LANCETS, glucose blood, simvastatin, Insulin  Syringe-Needle U-100, Probiotic Product (PROBIOTIC-10 PO), Vitamin D (Ergocalciferol), insulin NPH Human, insulin regular, and Cholecalciferol (VITAMIN D PO).  Meds ordered this encounter  Medications  . Cholecalciferol (VITAMIN D PO)    Sig: Take 5,000 Units by mouth daily.    CMA served as Education administrator during this visit. History, Physical and Plan performed by medical provider. Documentation and orders reviewed and attested to.  Penni Homans, MD

## 2016-05-04 NOTE — Assessment & Plan Note (Signed)
Uric acid WNL. Still having struggles with swelling, stiffness and pain in b/l hands. Consider referral to hand specialist. He is going to explore his options at the [place] and let us know if he would like a referral to Dr Amedeo Plenty.

## 2016-05-04 NOTE — Assessment & Plan Note (Signed)
Continue supplements and recheck with next blood draw

## 2016-05-04 NOTE — Assessment & Plan Note (Signed)
Encouraged heart healthy diet, increase exercise, avoid trans fats, consider a krill oil cap daily 

## 2016-05-04 NOTE — Progress Notes (Signed)
Pre visit review using our clinic review tool, if applicable. No additional management support is needed unless otherwise documented below in the visit note. 

## 2016-05-04 NOTE — Assessment & Plan Note (Signed)
Well controlled, no changes to meds. Encouraged heart healthy diet such as the DASH diet and exercise as tolerated.  °

## 2016-05-04 NOTE — Patient Instructions (Addendum)
Everything is [place]!!!!  Carbohydrate Counting for Diabetes Mellitus, Adult Carbohydrate counting is a method for keeping track of how many carbohydrates you eat. Eating carbohydrates naturally increases the amount of sugar (glucose) in the blood. Counting how many carbohydrates you eat helps keep your blood glucose within normal limits, which helps you manage your diabetes (diabetes mellitus). It is important to know how many carbohydrates you can safely have in each meal. This is different for every person. A diet and nutrition specialist (registered dietitian) can help you make a meal plan and calculate how many carbohydrates you should have at each meal and snack. Carbohydrates are found in the following foods:  Grains, such as breads and cereals.  Dried beans and soy products.  Starchy vegetables, such as potatoes, peas, and corn.  Fruit and fruit juices.  Milk and yogurt.  Sweets and snack foods, such as cake, cookies, candy, chips, and soft drinks. How do I count carbohydrates? There are two ways to count carbohydrates in food. You can use either of the methods or a combination of both. Reading "Nutrition Facts" on packaged food  The "Nutrition Facts" list is included on the labels of almost all packaged foods and beverages in the [place] It includes:  The serving size.  Information about nutrients in each serving, including the grams (g) of carbohydrate per serving. To use the "Nutrition Facts":  Decide how many servings you will have.  Multiply the number of servings by the number of carbohydrates per serving.  The resulting number is the total amount of carbohydrates that you will be having. Learning standard serving sizes of other foods  When you eat foods containing carbohydrates that are not packaged or do not include "Nutrition Facts" on the label, you need to measure the servings in order to count the amount of carbohydrates:  Measure the foods that you will eat with a  food scale or measuring cup, if needed.  Decide how many standard-size servings you will eat.  Multiply the number of servings by 15. Most carbohydrate-rich foods have about 15 g of carbohydrates per serving.  For example, if you eat 8 oz (170 g) of strawberries, you will have eaten 2 servings and 30 g of carbohydrates (2 servings x 15 g = 30 g).  For foods that have more than one food mixed, such as soups and casseroles, you must count the carbohydrates in each food that is included. The following list contains standard serving sizes of common carbohydrate-rich foods. Each of these servings has about 15 g of carbohydrates:   hamburger bun or  English muffin.   oz (15 mL) syrup.   oz (14 g) jelly.  1 slice of bread.  1 six-inch tortilla.  3 oz (85 g) cooked rice or pasta.  4 oz (113 g) cooked dried beans.  4 oz (113 g) starchy vegetable, such as peas, corn, or potatoes.  4 oz (113 g) hot cereal.  4 oz (113 g) mashed potatoes or  of a large baked potato.  4 oz (113 g) canned or frozen fruit.  4 oz (120 mL) fruit juice.  4-6 crackers.  6 chicken nuggets.  6 oz (170 g) unsweetened dry cereal.  6 oz (170 g) plain fat-free yogurt or yogurt sweetened with artificial sweeteners.  8 oz (240 mL) milk.  8 oz (170 g) fresh fruit or one small piece of fruit.  24 oz (680 g) popped popcorn. Example of carbohydrate counting Sample meal  3 oz (85 g) chicken  breast.  6 oz (170 g) brown rice.  4 oz (113 g) corn.  8 oz (240 mL) milk.  8 oz (170 g) strawberries with sugar-free whipped topping. Carbohydrate calculation 1. Identify the foods that contain carbohydrates:  Rice.  Corn.  Milk.  Strawberries. 2. Calculate how many servings you have of each food:  2 servings rice.  1 serving corn.  1 serving milk.  1 serving strawberries. 3. Multiply each number of servings by 15 g:  2 servings rice x 15 g = 30 g.  1 serving corn x 15 g = 15 g.  1 serving  milk x 15 g = 15 g.  1 serving strawberries x 15 g = 15 g. 4. Add together all of the amounts to find the total grams of carbohydrates eaten:  30 g + 15 g + 15 g + 15 g = 75 g of carbohydrates total. This information is not intended to replace advice given to you by your health care provider. Make sure you discuss any questions you have with your health care provider. Document Released: 03/01/2005 Document Revised: 09/19/2015 Document Reviewed: 08/13/2015 Elsevier Interactive Patient Education  2017 [employer].

## 2016-05-11 NOTE — Telephone Encounter (Signed)
lvm advising patient to schedule AWV appointment.

## 2016-05-14 ENCOUNTER — Encounter: Payer: Self-pay | Admitting: Internal Medicine

## 2016-05-14 ENCOUNTER — Ambulatory Visit (INDEPENDENT_AMBULATORY_CARE_PROVIDER_SITE_OTHER): Payer: Medicare HMO | Admitting: Internal Medicine

## 2016-05-14 VITALS — BP 140/80 | HR 71 | Wt 261.0 lb

## 2016-05-14 DIAGNOSIS — E1165 Type 2 diabetes mellitus with hyperglycemia: Secondary | ICD-10-CM

## 2016-05-14 DIAGNOSIS — E1142 Type 2 diabetes mellitus with diabetic polyneuropathy: Secondary | ICD-10-CM

## 2016-05-14 NOTE — Patient Instructions (Addendum)
Please change the insulin doses:  Insulin Before breakfast Before lunch Before dinner Bedtime  Regular 26-30 35 35    NPH 20   46   Please do not use more than 20 units of R insulin before a meal prior to exercise (rehab, working in the yard, etc.).  Please stop at the lab.  Please come back for a follow-up appointment in 3-4 months.

## 2016-05-14 NOTE — Progress Notes (Addendum)
Patient ID: Michael Cunningham, male   DOB: [birth year], 71 y.o.   MRN: [id number]  HPI: Michael Cunningham is a 71 y.o.-year-old male, returning for f/u DM2, dx in 1999, insulin-dependent since ~2008, controlled, with complications (CAD, peripheral neuropathy, DR). Last visit 4 mo ago. He is 60% disabled >> highest priority group within his insurance.  Last hemoglobin A1c: 10/01/2015: HbA1c calculated from fructosamine is still great, at 6.6%. 06/24/2015: HbA1c calculated from fructosamine is much lower, at 6.4%. Lab Results  Component Value Date   HGBA1C 7.3 (H) 04/23/2016   HGBA1C 7.0 (H) 12/25/2015   HGBA1C 7.6 (H) 06/24/2015   Pt is on a regimen of:  Insulin Before breakfast Before lunch Before dinner Bedtime  Regular 26-30 35 35    NPH 20   46  Please do not use more than 20 units of R insulin before a meal prior to exercise (rehab, working in the yard, etc.)  He cannot afford analog insulins. He tried Metformin R and XR >> diarrhea He tried Januvia.  Pt checks his sugars 1-2x a day, rotating checks: - am: 143-205, 222 >> 120-199 >> 98, 113-167, 208 >> ? >> 121-172, 188, [address] 157 >> 105-142, 219 - 2h after b'fast: n/c >> 177 >> n.c >> 163 - before lunch: 70-110 >> 87-168, 250 >> 84, 88 >> 82 >> 78, 111-165, 193 >> ? >> 82-120 >> 133-160 (snack) - 2h after lunch: n/c >> 76 >> 70 >> n/c - before dinner: 100-150 >> 73, 89-129, 207 >> 70 >> 72 >> 122-152 >> ? >> 94-133 >> 71 - 2h after dinner: n/c - bedtime: 100-160 >> 137 >> n/c >> 73, 141-186 >> 135-167, 183 >> ? >> 137-154 >> n/c - nighttime: lows >> wake him up >> corrects them (cookie) >> 90 >> n/c + fewer lows - Lowest sugar was 70 >> 70 >> 87 >> 71; he has hypoglycemia awareness at 90.  Highest sugar was 270 >> 250 >> 222 >> 206 >> 208 >> 219  Glucometer: AccuChek [employer]iva >> Precision Extra by [employer] (given by [place]).  Pt's meals are: - Breakfast: cheerios + skim milk + banana + strawberries - Lunch: sandwich or leftover  - Dinner:  meat + veggies + starch - Snacks: PB crackers; wheat thins, apple or grapes, pear   - no CKD, last BUN/creatinine:  Lab Results  Component Value Date   BUN 14 04/23/2016   CREATININE 0.90 04/23/2016   on losartan - last set of lipids: Lab Results  Component Value Date   CHOL 144 12/25/2015   HDL 34.10 (L) 12/25/2015   LDLCALC 72 12/25/2015   LDLDIRECT 108.0 04/16/2014   TRIG 190.0 (H) 12/25/2015   CHOLHDL 4 12/25/2015   on simvastatin - last eye exam was in 10/2015. Had DR (mild), but not at last check. + angle-closure glaucoma >> had surgery in both eyes >> had blurry vision L eye >> resolved.  - no numbness and tingling in his L foot. He had a foot exam 12/2014 (dorsum L hallux + fungal inf) - [facility] (Dr Amalia Hailey). Very mild PN in R foot.   He c/o swelling and pain in hands. No significant leg swelling.  He also has a history of hypertension and hyperlipidemia. He has vitamin D def >> was on Ergocalciferol >> now on 5000 IU D3.  ROS: Constitutional: no weight gain/loss, no fatigue, no subjective hyperthermia/hypothermia Eyes: no blurry vision, no xerophthalmia ENT: no sore throat, no nodules palpated  in throat, no dysphagia/odynophagia, no hoarseness Cardiovascular: no CP/SOB/palpitations/leg swelling Respiratory: no cough/SOB Gastrointestinal: no N/V/D/C Musculoskeletal: no muscle/joint aches Skin: no rashes Neurological: no tremors/numbness/tingling/dizziness  I reviewed pt's medications, allergies, PMH, social hx, family hx, and changes were documented in the history of present illness. Otherwise, unchanged from my initial visit note:  Past Medical History:  Diagnosis Date  . CAD (coronary artery disease)    a. 05/2014 [place] s/p DES x4 to mid-distLAD and DESx1 to DI  . Complication of anesthesia    11'12 Surgery with excrutiating "head about to explode" upon awakening  -lasted several hours postop  ( SURGERY DONE 08-13-2011 AT [facility] PT DID OK POST OP)  .  Diverticulosis of colon    LEFT SIDE  . History of small bowel obstruction    X2  28 YRS AGO AND LAST ONE 04/2014--  RESOLVED WITHOUT SURGICAL INTERVENTION  . Hx of small bowel obstruction 06/14/2011   Follows with Dr Watt Climes   . Hyperlipidemia   . Hypertension   . Knee pain, bilateral 08/13/2011  . Left ureteral calculus   . Measles =  . Multiple lipomas    hx. mulitple and some remains -arms, legs  . Obesity, unspecified 09/18/2013  . Obstructive sleep apnea 01/01/2016  . Recurrent kidney stones 07/08/2013   Follows with Dr Gaynelle Arabian He believes they are Calcium oxalate stones  . Thrombocytopenia ([place]) 07/07/2014  . Type 2 diabetes mellitus ([place])   . Vitamin D deficiency 07/02/2014   Past Surgical History:  Procedure Laterality Date  . COLONOSCOPY W/ POLYPECTOMY  LAST ONE 2013  . CORONARY STENT PLACEMENT  05/17/2014   PROXIMAL X 4  MID & DISTAL LAD  DIAGONAL   . KNEE ARTHROSCOPY  08/13/2011   right ARTHROSCOPY KNEE;  Surgeon: Gearlean Alf, MD;  Location: [facility];  Service: Orthopedics;  Laterality: Right;  medial  meniscal debridement, excision of plica, synovectomy  . KNEE ARTHROSCOPY Left 11/16/2013   Procedure: LEFT KNEE ARTHROSCOPY MEDIAL MENISCAL DEBRIDEMENT, CHONDROPLASTY;  Surgeon: Gearlean Alf, MD;  Location: [facility];  Service: Orthopedics;  Laterality: Left;  . KNEE ARTHROSCOPY W/ MENISCECTOMY Right 01-29-2011   twice, first trimmed mediscus and cyst  . LEFT HEART CATHETERIZATION WITH CORONARY ANGIOGRAM N/A 05/17/2014   Procedure: LEFT HEART CATHETERIZATION WITH CORONARY ANGIOGRAM;  Surgeon: Burnell Blanks, MD;  Location: [facility] CATH LAB;  Service: Cardiovascular;  Laterality: N/A;  . [place]   multiple, 10-12  . PERCUTANEOUS CORONARY STENT INTERVENTION (PCI-S)  05/17/2014   Procedure: PERCUTANEOUS CORONARY STENT INTERVENTION (PCI-S);  Surgeon: Burnell Blanks, MD;  Location: [facility] CATH LAB;  Service: Cardiovascular;;  Diag1 and Prox to Distal LAD   History    Social History  . Marital Status: Married    Spouse Name: N/A  . Number of Children: 2   Social History Main Topics  . Smoking status: Former Smoker -- 1.00 packs/day for 4 years    Types: Cigarettes    Quit date: 06/14/1967  . Smokeless tobacco: Never Used  . Alcohol Use: No  . Drug Use: No  . 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     Current Outpatient Prescriptions on File Prior to Visit  Medication Sig Dispense Refill  . ACCU-CHEK FASTCLIX LANCETS MISC Use to test blood sugar 2 times daily. Dx: E11.40 204 each 3  . acetaminophen (TYLENOL) 500 MG tablet  Take 1,000 mg by mouth every 6 (six) hours as needed for mild pain. Reported on 03/31/2015    . aspirin EC 81 MG tablet Take 81 mg by mouth daily with breakfast.     . Cholecalciferol (VITAMIN D PO) Take 5,000 Units by mouth daily.    [name] glucose blood (ACCU-CHEK AVIVA PLUS) test strip Use to test blood sugar 2 times daily. Dx: E11.40 200 each 3  . insulin NPH Human (HUMULIN N) 100 UNIT/ML injection INJECT UNDER SKIN 20 UNITS IN THE AM AND 46 UNITS AT BEDTIME. 20 mL 3  . insulin regular (HUMULIN R) 100 units/mL injection Inject 0.26-0.35 mLs (26-35 Units total) into the skin 3 (three) times daily before meals. 30 mL 3  . Insulin Syringe-Needle U-100 (TRUEPLUS INSULIN SYRINGE) 30G X 5/16" 0.5 ML MISC USE TO INJECT INSULIN 5 TIMES DAILY. 200 each 3  . losartan (COZAAR) 50 MG tablet TAKE 1&1/2 (75 MG TOTAL) TABLETS BY MOUTH EVERY MORNING 180 tablet 3  . metoprolol tartrate (LOPRESSOR) 25 MG tablet Take 1 tablet (25 mg total) by mouth 2 (two) times daily. 180 tablet 3  . Multiple Vitamin (MULTIVITAMIN WITH MINERALS) TABS tablet Take 1 tablet by mouth daily.    . nitroGLYCERIN (NITROSTAT) 0.4 MG SL tablet Place 1 tablet (0.4 mg total) under the tongue every 5 (five) minutes as needed for chest pain. 90 tablet 3  . Polyethylene Glycol 3350  (MIRALAX PO) Take by mouth as needed. Reported on 03/31/2015    . prasugrel (EFFIENT) 10 MG TABS tablet Take 1 tablet (10 mg total) by mouth daily. 90 tablet 3  . Probiotic Product (PROBIOTIC-10 PO) Take 1 tablet by mouth daily.    . simvastatin (ZOCOR) 40 MG tablet TAKE 1 TABLET (40 MG) BY MOUTH AT BEDTIME. 90 tablet 2   No current facility-administered medications on file prior to visit.    Allergies  Allergen Reactions  . Ciprofloxacin Other (See Comments)    Abdominal pain  . Lipitor [Atorvastatin Calcium] Other (See Comments)    Severe muscle pain  . Lisinopril Cough  . Metformin And Related Diarrhea   Family History  Problem Relation Age of Onset  . Hypertension Mother   . Cancer Mother 32    ovarian  . Heart disease Father     CHF  . Heart attack Father 29  . Cancer Sister     breast- just finished her last radiation  . Heart disease Maternal Grandfather     ?  [name] Heart attack Paternal Grandfather 48  . Diabetes Neg Hx   . Heart attack Paternal Uncle 48    Sudden death   PE: BP 140/80 (BP Location: Left Arm, Patient Position: Sitting)   Pulse 71   Wt 261 lb (118.4 kg)   SpO2 96%   BMI 39.68 kg/m  Body mass index is 39.68 kg/m.  Wt Readings from Last 3 Encounters:  05/14/16 261 lb (118.4 kg)  05/04/16 259 lb 6.4 oz (117.7 kg)  01/01/16 261 lb 6.4 oz (118.6 kg)   Constitutional: Obese, in NAD Eyes: PERRLA, EOMI, no exophthalmos ENT: moist mucous membranes, no thyromegaly, no cervical lymphadenopathy Cardiovascular: RRR, No MRG Respiratory: CTA B Gastrointestinal: abdomen soft, NT, ND, BS+ Musculoskeletal: no deformities, strength intact in all 4 Skin: moist, warm, + rash: Vitiligo on arms Neurological: no tremor with outstretched hands, DTR normal in all 4  ASSESSMENT: 1. DM2, insulin-dependent, now better controlled, with complications - CAD, status post stent 05/2014 - Dr Percival Spanish -  Peripheral neuropathy - DR  PLAN:  1. Patient with long-standing,  fairly well controlled diabetes. His fructosamine was at goal last summer. He brings a sugar log >> sugars in am are better after increasing his NPH insulin at bedtime, but sugars before lunch are higher. Upon Q'ing, he has a snack mid-am. Will not increase am R insulin dose. - I advised him to: Patient Instructions   Please change the insulin doses:  Insulin Before breakfast Before lunch Before dinner Bedtime  Regular 26-30 35 35    NPH 20   46   Please do not use more than 20 units of R insulin before a meal prior to exercise (rehab, working in the yard, etc.).  Please stop at the lab.  Please come back for a follow-up appointment in 3-4 months.   - I advised him to continue to check CBGs - checks some sugars at BT also - advised for yearly eye exams >> He is UTD - UTD with flu shot - will check a fructosamine today - regarding his hand swelling and pain >> suggested to see a neurologist to see if he has neuropathy. - Return to clinic in 3 mo with sugar log   Office Visit on 05/14/2016  Component Date Value Ref Range Status  . Fructosamine 05/14/2016 258  190 - 270 umol/L Final   HbA1c calculated from the fructosamine is better, at 5.9%.  Philemon Kingdom, MD PhD [facility] Endocrinology

## 2016-05-17 LAB — FRUCTOSAMINE: FRUCTOSAMINE: 258 umol/L (ref 190–270)

## 2016-05-18 ENCOUNTER — Other Ambulatory Visit: Payer: Self-pay | Admitting: *Deleted

## 2016-05-18 MED ORDER — OSELTAMIVIR PHOSPHATE 75 MG PO CAPS: 75.0000 mg | ORAL_CAPSULE | Freq: Every day | ORAL | 0 refills | Status: DC

## 2016-05-18 MED FILL — OSELTAMIVIR PHOSPHATE 75 MG: 75 | 10 days supply | Qty: 10 | Fill #0

## 2016-05-18 NOTE — Telephone Encounter (Signed)
Wife dx with flu.  Rx written for Tamiflu

## 2016-05-19 DIAGNOSIS — G4733 Obstructive sleep apnea (adult) (pediatric): Secondary | ICD-10-CM | POA: Diagnosis not present

## 2016-05-26 ENCOUNTER — Encounter: Payer: Self-pay | Admitting: Podiatry

## 2016-05-26 ENCOUNTER — Ambulatory Visit (INDEPENDENT_AMBULATORY_CARE_PROVIDER_SITE_OTHER): Payer: Medicare HMO | Admitting: Podiatry

## 2016-05-26 VITALS — BP 134/78 | HR 61 | Resp 18

## 2016-05-26 DIAGNOSIS — B351 Tinea unguium: Secondary | ICD-10-CM | POA: Diagnosis not present

## 2016-05-26 DIAGNOSIS — M79675 Pain in left toe(s): Secondary | ICD-10-CM

## 2016-05-26 DIAGNOSIS — M79674 Pain in right toe(s): Secondary | ICD-10-CM

## 2016-05-26 DIAGNOSIS — M79676 Pain in unspecified toe(s): Secondary | ICD-10-CM | POA: Diagnosis not present

## 2016-05-26 NOTE — Progress Notes (Signed)
Patient ID: Michael Cunningham, male   DOB: Jan 13, 1946, 71 y.o.   MRN: [id number]      Subjective: This patient presents today complaining of elongated and thickened toenails for term comfortable walking wearing shoes and requests nail debridement  Objective: Orientated 3 Mild nonpitting peripheral edema bilaterally DP and PT pulses 2/4 bilaterally Capillary reflex immediate bilaterally Sensation to 10 g monofilament wire intact 5/5 bilaterally Vibratory sensation reactive bilaterally Ankle reflex equal and reactive bilaterally No open skin lesions bilaterally The toenails are elongated, discolored, hypertrophic, deformed and tender to direct palpation 6-10 Manual motor testing dorsi flexion, plantar flexion, inversion, eversion 5/5 bilaterally There is no restriction in the ankle, subtalar, midtarsal joints bilaterally  Assessment: Type II diabetic with satisfactory neurovascular status Symptomatic onychomycoses 6-10  Plan: Debridement toenails 6-10 mechanically an electrical without any bleeding  Reappoint 3 months

## 2016-05-26 NOTE — Patient Instructions (Signed)

## 2016-06-19 DIAGNOSIS — G4733 Obstructive sleep apnea (adult) (pediatric): Secondary | ICD-10-CM | POA: Diagnosis not present

## 2016-07-19 DIAGNOSIS — G4733 Obstructive sleep apnea (adult) (pediatric): Secondary | ICD-10-CM | POA: Diagnosis not present

## 2016-07-21 ENCOUNTER — Encounter: Payer: Self-pay | Admitting: Cardiology

## 2016-08-31 ENCOUNTER — Encounter: Payer: Self-pay | Admitting: Podiatry

## 2016-08-31 ENCOUNTER — Ambulatory Visit (INDEPENDENT_AMBULATORY_CARE_PROVIDER_SITE_OTHER): Payer: Medicare HMO | Admitting: Podiatry

## 2016-08-31 DIAGNOSIS — M79676 Pain in unspecified toe(s): Secondary | ICD-10-CM | POA: Diagnosis not present

## 2016-08-31 DIAGNOSIS — B351 Tinea unguium: Secondary | ICD-10-CM

## 2016-08-31 DIAGNOSIS — E119 Type 2 diabetes mellitus without complications: Secondary | ICD-10-CM

## 2016-08-31 NOTE — Patient Instructions (Signed)

## 2016-08-31 NOTE — Progress Notes (Signed)
Patient ID: Michael Cunningham, male   DOB: 12/08/1945, 71 y.o.   MRN: [id number]    Subjective: This patient presents today complaining of elongated and thickened toenails for term comfortable walking wearing shoes and requests nail debridement  Objective: Orientated 3 Mild nonpitting peripheral edema bilaterally DP and PT pulses 2/4 bilaterally Capillary reflex immediate bilaterally Sensation to 10 g monofilament wire intact 5/5 bilaterally Vibratory sensation reactive bilaterally Ankle reflex equal and reactive bilaterally No open skin lesions bilaterally The toenails are elongated, discolored, hypertrophic, deformed and tender to direct palpation 6-10 Manual motor testing dorsi flexion, plantar flexion, inversion, eversion 5/5 bilaterally There is no restriction in the ankle, subtalar, midtarsal joints bilaterally  Assessment: Type II diabeticwith satisfactory neurovascular status Symptomatic onychomycoses 6-10  Plan: Debridement toenails 6-10 mechanically an electrical without any bleeding  Reappoint 3 months 

## 2016-09-09 ENCOUNTER — Other Ambulatory Visit: Payer: Medicare HMO

## 2016-09-13 NOTE — Progress Notes (Signed)
Subjective:   Michael Cunningham is a 71 y.o. male who presents for Medicare Annual/Subsequent preventive examination.  Follows w/ primary care at the [place].  Review of Systems:  No ROS.  Medicare Wellness Visit. Additional risk factors are reflected in the social history.  Cardiac Risk Factors include: diabetes mellitus;advanced age (>60men, >73 women);dyslipidemia;obesity (BMI >30kg/m2);hypertension;male gender  Sleep patterns: no sleep issues. Wears CPAP nightly. Home Safety/Smoke Alarms: Feels safe in home. Smoke alarms in place.  Living environment; residence and Firearm Safety: Lives w/ wife. 2-story house, can live on one level, bedroom on lower floor, bathroom on lower floor, no firearms. Seat Belt Safety/Bike Helmet: Wears seat belt.   Counseling:   Eye Exam- Follows w/ Dr. Ellie Lunch Dental- Does not follow w/ dentist regularly.  Male:   CCS- last 07/14/11 at [facility]. No report on file. He follows w/ Dr. Watt Climes regularly.   PSA-  Lab Results  Component Value Date   PSA 1.24 07/24/2014       Objective:    Vitals: BP 128/68   Pulse (!) 52   Temp 98.2 F (36.8 C) (Oral)   Resp 16   Ht 5' 8.5" (1.74 m)   Wt 261 lb 6.4 oz (118.6 kg)   SpO2 98%   BMI 39.17 kg/m   Body mass index is 39.17 kg/m.  Tobacco History  Smoking Status  . Former Smoker  . Packs/day: 1.00  . Years: 4.00  . Types: Cigarettes  . Quit date: 06/14/1967  Smokeless Tobacco  . Never Used     Counseling given: Not Answered   Past Medical History:  Diagnosis Date  . CAD (coronary artery disease)    a. 05/2014 [place] s/p DES x4 to mid-distLAD and DESx1 to DI  . Complication of anesthesia    11'12 Surgery with excrutiating "head about to explode" upon awakening  -lasted several hours postop  ( SURGERY DONE 08-13-2011 AT [facility] PT DID OK POST OP)  . Diverticulosis of colon    LEFT SIDE  . History of small bowel obstruction    X2  28 YRS AGO AND LAST ONE 04/2014--  RESOLVED WITHOUT SURGICAL  INTERVENTION  . Hx of small bowel obstruction 06/14/2011   Follows with Dr Watt Climes   . Hyperlipidemia   . Hypertension   . Knee pain, bilateral 08/13/2011  . Left ureteral calculus   . Measles =  . Multiple lipomas    hx. mulitple and some remains -arms, legs  . Obesity, unspecified 09/18/2013  . Obstructive sleep apnea 01/01/2016  . Recurrent kidney stones 07/08/2013   Follows with Dr Gaynelle Arabian He believes they are Calcium oxalate stones  . Thrombocytopenia ([place]) 07/07/2014  . Type 2 diabetes mellitus ([place])   . Vitamin D deficiency 07/02/2014   Past Surgical History:  Procedure Laterality Date  . COLONOSCOPY W/ POLYPECTOMY  LAST ONE 2013  . CORONARY STENT PLACEMENT  05/17/2014   PROXIMAL X 4  MID & DISTAL LAD  DIAGONAL   . KNEE ARTHROSCOPY  08/13/2011   right ARTHROSCOPY KNEE;  Surgeon: Gearlean Alf, MD;  Location: [facility]S;  Service: Orthopedics;  Laterality: Right;  medial  meniscal debridement, excision of plica, synovectomy  . KNEE ARTHROSCOPY Left 11/16/2013   Procedure: LEFT KNEE ARTHROSCOPY MEDIAL MENISCAL DEBRIDEMENT, CHONDROPLASTY;  Surgeon: Gearlean Alf, MD;  Location: [facility];  Service: Orthopedics;  Laterality: Left;  . KNEE ARTHROSCOPY W/ MENISCECTOMY Right 01-29-2011   twice, first trimmed mediscus and cyst  . LEFT  HEART CATHETERIZATION WITH CORONARY ANGIOGRAM N/A 05/17/2014   Procedure: LEFT HEART CATHETERIZATION WITH CORONARY ANGIOGRAM;  Surgeon: Burnell Blanks, MD;  Location: [facility] CATH LAB;  Service: Cardiovascular;  Laterality: N/A;  . [place]   multiple, 10-12  . PERCUTANEOUS CORONARY STENT INTERVENTION (PCI-S)  05/17/2014   Procedure: PERCUTANEOUS CORONARY STENT INTERVENTION (PCI-S);  Surgeon: Burnell Blanks, MD;  Location: [facility] CATH LAB;  Service: Cardiovascular;;  Diag1 and Prox to Distal LAD   Family History  Problem Relation Age of Onset  . Hypertension Mother   . Cancer Mother 73       ovarian  . Heart disease Father        CHF  .  Heart attack Father 39  . Cancer Sister        breast- just finished her last radiation  . Heart disease Maternal Grandfather        ?  [name] Heart attack Paternal Grandfather 30  . Heart attack Paternal Uncle 36       Sudden death  . Diabetes Neg Hx    History  Sexual Activity  . Sexual activity: Yes    Comment: lives with wife    Outpatient Encounter Prescriptions as of 09/16/2016  Medication Sig  . ACCU-CHEK FASTCLIX LANCETS MISC Use to test blood sugar 2 times daily. Dx: E11.40  . acetaminophen (TYLENOL) 500 MG tablet Take 1,000 mg by mouth every 6 (six) hours as needed for mild pain. Reported on 03/31/2015  . aspirin EC 81 MG tablet Take 81 mg by mouth daily with breakfast.   . Cholecalciferol (VITAMIN D PO) Take 5,000 Units by mouth daily.  [name] glucose blood (ACCU-CHEK AVIVA PLUS) test strip Use to test blood sugar 2 times daily. Dx: E11.40  . insulin NPH Human (HUMULIN N) 100 UNIT/ML injection INJECT UNDER SKIN 20 UNITS IN THE AM AND 46 UNITS AT BEDTIME.  . insulin regular (HUMULIN R) 100 units/mL injection Inject 0.26-0.35 mLs (26-35 Units total) into the skin 3 (three) times daily before meals.  . Insulin Syringe-Needle U-100 (TRUEPLUS INSULIN SYRINGE) 30G X 5/16" 0.5 ML MISC USE TO INJECT INSULIN 5 TIMES DAILY.  [name] losartan (COZAAR) 50 MG tablet TAKE 1&1/2 (75 MG TOTAL) TABLETS BY MOUTH EVERY MORNING  . metoprolol tartrate (LOPRESSOR) 25 MG tablet Take 1 tablet (25 mg total) by mouth 2 (two) times daily.  . Multiple Vitamin (MULTIVITAMIN WITH MINERALS) TABS tablet Take 1 tablet by mouth daily.  . nitroGLYCERIN (NITROSTAT) 0.4 MG SL tablet Place 1 tablet (0.4 mg total) under the tongue every 5 (five) minutes as needed for chest pain.  [name] oseltamivir (TAMIFLU) 75 MG capsule Take 1 capsule (75 mg total) by mouth daily.  . Polyethylene Glycol 3350 (MIRALAX PO) Take by mouth as needed. Reported on 03/31/2015  . prasugrel (EFFIENT) 10 MG TABS tablet Take 1 tablet (10 mg total) by mouth daily.   . Probiotic Product (PROBIOTIC-10 PO) Take 1 tablet by mouth daily.  . simvastatin (ZOCOR) 40 MG tablet TAKE 1 TABLET (40 MG) BY MOUTH AT BEDTIME.   No facility-administered encounter medications on file as of 09/16/2016.     Activities of Daily Living In your present state of health, do you have any difficulty performing the following activities: 09/16/2016  Hearing? N  Vision? N  Difficulty concentrating or making decisions? N  Walking or climbing stairs? N  Dressing or bathing? Y  Doing errands, shopping? N  Preparing Food and eating ? N  Using  the Toilet? N  In the past six months, have you accidently leaked urine? N  Do you have problems with loss of bowel control? N  Managing your Medications? N  Managing your Finances? N  Some recent data might be hidden    Patient Care Team: Mosie Lukes, MD as PCP - General (Family Medicine) Minus Breeding, MD as Consulting Physician (Cardiology) Philemon Kingdom, MD as Consulting Physician (Endocrinology) Gean Birchwood, DPM as Consulting Physician (Podiatry) Gaynelle Arabian, MD as Consulting Physician (Orthopedic Surgery) Troy Sine, MD as Consulting Physician (Cardiology) Luberta Mutter, MD as Consulting Physician (Ophthalmology) Clarene Essex, MD as Consulting Physician (Gastroenterology) [place], Jennefer Bravo, MD as Consulting Physician (Dermatology)   Assessment:    Physical assessment deferred to PCP.  Exercise Activities and Dietary recommendations Current Exercise Habits: Home exercise routine, Type of exercise: Other - see comments (home exercise machine similar to recumbent bike), Time (Minutes): 20, Frequency (Times/Week): 2, Weekly Exercise (Minutes/Week): 40  Diet (meal preparation, eat out, water intake, caffeinated beverages, dairy products, fruits and vegetables): in general, a "healthy" diet  , well balanced, on average, 3 meals per day. Regular diet. Snacks between meals. Drinks most caffeine free diet  Coke and some water and Propel water.       Goals    . Increase physical activity          Start using your exercise machine for at least 25 minutes 2-3 days weekly.      Fall Risk Fall Risk  09/16/2016 07/01/2015 04/16/2014  Falls in the past year? No No No   Depression Screen PHQ 2/9 Scores 09/16/2016 01/01/2016 07/01/2015 09/11/2014  PHQ - 2 Score 0 0 0 0    Cognitive Function MMSE - Mini Mental State Exam 09/16/2016  Orientation to time 5  Orientation to Place 5  Registration 3  Attention/ Calculation 5  Recall 3  Language- name 2 objects 2  Language- repeat 1  Language- follow 3 step command 3  Language- read & follow direction 1  Write a sentence 1  Copy design 1  Total score 30        Immunization History  Administered Date(s) Administered  . Hepatitis A 10/18/2003  . Hepatitis B 10/18/2003, 11/29/2003  . Influenza, High Dose Seasonal PF 12/19/2015  . Influenza,inj,Quad PF,36+ Mos 01/10/2014, 12/26/2014  . Influenza-Unspecified 11/19/2010, 12/03/2011, 12/12/2012  . Mumps 01/29/2004  . Pneumococcal Conjugate-13 01/10/2014  . Pneumococcal Polysaccharide-23 10/18/2003, 03/31/2015  . Td 05/13/1997, 05/21/2009  . Tdap 12/18/2015  . Zoster 06/06/2009   Screening Tests Health Maintenance  Topic Date Due  . FOOT EXAM  06/30/2016  . INFLUENZA VACCINE  10/13/2016  . OPHTHALMOLOGY EXAM  10/13/2016  . HEMOGLOBIN A1C  01/19/2017  . COLONOSCOPY  07/13/2021  . TETANUS/TDAP  12/17/2025  . Hepatitis C Screening  Completed  . PNA vac Low Risk Adult  Completed      Plan:   Follow-up w/ PCP as directed.  Bring a copy of your advance directives to your next office visit. MyChart reminder sent to patient per his request.  I have personally reviewed and noted the following in the patient's chart:   . Medical and social history . Use of alcohol, tobacco or illicit drugs  . Current medications and supplements . Functional ability and status . Nutritional  status . Physical activity . Advanced directives . List of other physicians . Vitals . Screenings to include cognitive, depression, and falls . Referrals and appointments  In addition, I  have reviewed and discussed with patient certain preventive protocols, quality metrics, and best practice recommendations. A written personalized care plan for preventive services as well as general preventive health recommendations were provided to patient.     Dorrene German, RN  09/16/2016

## 2016-09-16 ENCOUNTER — Encounter: Payer: Self-pay | Admitting: Family Medicine

## 2016-09-16 ENCOUNTER — Encounter: Payer: Self-pay | Admitting: *Deleted

## 2016-09-16 ENCOUNTER — Ambulatory Visit (INDEPENDENT_AMBULATORY_CARE_PROVIDER_SITE_OTHER): Payer: Medicare HMO | Admitting: Family Medicine

## 2016-09-16 ENCOUNTER — Ambulatory Visit: Payer: Medicare HMO | Admitting: *Deleted

## 2016-09-16 VITALS — BP 128/68 | HR 52 | Temp 98.2°F | Resp 16 | Ht 68.5 in | Wt 261.4 lb

## 2016-09-16 DIAGNOSIS — E1142 Type 2 diabetes mellitus with diabetic polyneuropathy: Secondary | ICD-10-CM | POA: Diagnosis not present

## 2016-09-16 DIAGNOSIS — E559 Vitamin D deficiency, unspecified: Secondary | ICD-10-CM

## 2016-09-16 DIAGNOSIS — E782 Mixed hyperlipidemia: Secondary | ICD-10-CM | POA: Diagnosis not present

## 2016-09-16 DIAGNOSIS — Z0000 Encounter for general adult medical examination without abnormal findings: Secondary | ICD-10-CM

## 2016-09-16 DIAGNOSIS — E669 Obesity, unspecified: Secondary | ICD-10-CM | POA: Diagnosis not present

## 2016-09-16 DIAGNOSIS — I10 Essential (primary) hypertension: Secondary | ICD-10-CM | POA: Diagnosis not present

## 2016-09-16 NOTE — Patient Instructions (Addendum)
Michael Cunningham , Thank you for taking time to come for your Medicare Wellness Visit. I appreciate your ongoing commitment to your health goals. Please review the following plan we discussed and let me know if I can assist you in the future.   Bring a copy of your advance directives to your next office visit.  These are the goals we discussed: Goals    . Increase physical activity          Start using your exercise machine for at least 25 minutes 2-3 days weekly.       This is a list of the screening recommended for you and due dates:  Health Maintenance  Topic Date Due  . Complete foot exam   06/30/2016  . Flu Shot  10/13/2016  . Eye exam for diabetics  10/13/2016  . Hemoglobin A1C  01/19/2017  . Colon Cancer Screening  07/13/2021  . Tetanus Vaccine  12/17/2025  .  Hepatitis C: One time screening is recommended by Center for Disease Control  (CDC) for  adults born from [birth year] through 1965.   Completed  . Pneumonia vaccines  Completed    Preventive Care 65 Years and Older, Male Preventive care refers to lifestyle choices and visits with your health care provider that can promote health and wellness. What does preventive care include?  A yearly physical exam. This is also called an annual well check.  Dental exams once or twice a year.  Routine eye exams. Ask your health care provider how often you should have your eyes checked.  Personal lifestyle choices, including: ? Daily care of your teeth and gums. ? Regular physical activity. ? Eating a healthy diet. ? Avoiding tobacco and drug use. ? Limiting alcohol use. ? Practicing safe sex. ? Taking low doses of aspirin every day. ? Taking vitamin and mineral supplements as recommended by your health care provider. What happens during an annual well check? The services and screenings done by your health care provider during your annual well check will depend on your age, overall health, lifestyle risk factors, and family history of  disease. Counseling Your health care provider may ask you questions about your:  Alcohol use.  Tobacco use.  Drug use.  Emotional well-being.  Home and relationship well-being.  Sexual activity.  Eating habits.  History of falls.  Memory and ability to understand (cognition).  Work and work Statistician.  Screening You may have the following tests or measurements:  Height, weight, and BMI.  Blood pressure.  Lipid and cholesterol levels. These may be checked every 5 years, or more frequently if you are over 39 years old.  Skin check.  Lung cancer screening. You may have this screening every year starting at age 78 if you have a 30-pack-year history of smoking and currently smoke or have quit within the past 15 years.  Fecal occult blood test (FOBT) of the stool. You may have this test every year starting at age 11.  Flexible sigmoidoscopy or colonoscopy. You may have a sigmoidoscopy every 5 years or a colonoscopy every 10 years starting at age 52.  Prostate cancer screening. Recommendations will vary depending on your family history and other risks.  Hepatitis C blood test.  Hepatitis B blood test.  Sexually transmitted disease (STD) testing.  Diabetes screening. This is done by checking your blood sugar (glucose) after you have not eaten for a while (fasting). You may have this done every 1-3 years.  Abdominal aortic aneurysm (AAA) screening. You may  need this if you are a current or former smoker.  Osteoporosis. You may be screened starting at age 22 if you are at high risk.  Talk with your health care provider about your test results, treatment options, and if necessary, the need for more tests. Vaccines Your health care provider may recommend certain vaccines, such as:  Influenza vaccine. This is recommended every year.  Tetanus, diphtheria, and acellular pertussis (Tdap, Td) vaccine. You may need a Td booster every 10 years.  Varicella vaccine. You  may need this if you have not been vaccinated.  Zoster vaccine. You may need this after age 63.  Measles, mumps, and rubella (MMR) vaccine. You may need at least one dose of MMR if you were born in 1957 or later. You may also need a second dose.  Pneumococcal 13-valent conjugate (PCV13) vaccine. One dose is recommended after age 41.  Pneumococcal polysaccharide (PPSV23) vaccine. One dose is recommended after age 74.  Meningococcal vaccine. You may need this if you have certain conditions.  Hepatitis A vaccine. You may need this if you have certain conditions or if you travel or work in places where you may be exposed to hepatitis A.  Hepatitis B vaccine. You may need this if you have certain conditions or if you travel or work in places where you may be exposed to hepatitis B.  Haemophilus influenzae type b (Hib) vaccine. You may need this if you have certain risk factors.  Talk to your health care provider about which screenings and vaccines you need and how often you need them. This information is not intended to replace advice given to you by your health care provider. Make sure you discuss any questions you have with your health care provider. Document Released: 03/28/2015 Document Revised: 11/19/2015 Document Reviewed: 12/31/2014 Elsevier Interactive Patient Education  2017 [employer].

## 2016-09-19 NOTE — Assessment & Plan Note (Signed)
Encouraged heart healthy diet, increase exercise, avoid trans fats, consider a krill oil cap daily 

## 2016-09-19 NOTE — Assessment & Plan Note (Signed)
hgba1c acceptable, minimize simple carbs. Increase exercise as tolerated. Continue current meds 

## 2016-09-19 NOTE — Assessment & Plan Note (Signed)
Patient encouraged to maintain heart healthy diet, regular exercise, adequate sleep. Consider daily probiotics. Take medications as prescribed 

## 2016-09-19 NOTE — Assessment & Plan Note (Addendum)
Adequate controlled, no changes to meds. Encouraged heart healthy diet such as the DASH diet and exercise as tolerated.

## 2016-09-19 NOTE — Assessment & Plan Note (Signed)
Encouraged DASH diet, decrease po intake and increase exercise as tolerated. Needs 7-8 hours of sleep nightly. Avoid trans fats, eat small, frequent meals every 4-5 hours with lean proteins, complex carbs and healthy fats. Minimize simple carbs, bariatric referral 

## 2016-09-19 NOTE — Progress Notes (Signed)
Patient ID: Michael Cunningham, male   DOB: Aug 04, 1945, 71 y.o.   MRN: [id number]   Subjective:    Patient ID: Michael Cunningham, male    DOB: Mar 23, 1945, 71 y.o.   MRN: [id number]  Chief Complaint  Patient presents with  . Medicare Wellness    w/ RN   . Annual Exam    Non-fasting, no concerns.    HPI Patient is in today for annual exam and follow up on chronic medical concerns including diabetes, obesity, hyperlipidemia and hypertension. He feels well today. No recent febrile illness or hospitalizations. Denies CP/palp/SOB/HA/congestion/fevers/GI or GU c/o. Taking meds as prescribed  Past Medical History:  Diagnosis Date  . CAD (coronary artery disease)    a. 05/2014 [place] s/p DES x4 to mid-distLAD and DESx1 to DI  . Complication of anesthesia    11'12 Surgery with excrutiating "head about to explode" upon awakening  -lasted several hours postop  ( SURGERY DONE 08-13-2011 AT [facility] PT DID OK POST OP)  . Diverticulosis of colon    LEFT SIDE  . History of small bowel obstruction    X2  28 YRS AGO AND LAST ONE 04/2014--  RESOLVED WITHOUT SURGICAL INTERVENTION  . Hx of small bowel obstruction 06/14/2011   Follows with Dr Watt Climes   . Hyperlipidemia   . Hypertension   . Knee pain, bilateral 08/13/2011  . Left ureteral calculus   . Measles =  . Multiple lipomas    hx. mulitple and some remains -arms, legs  . Obesity, unspecified 09/18/2013  . Obstructive sleep apnea 01/01/2016  . Recurrent kidney stones 07/08/2013   Follows with Dr Gaynelle Arabian He believes they are Calcium oxalate stones  . Thrombocytopenia ([place]) 07/07/2014  . Type 2 diabetes mellitus ([place])   . Vitamin D deficiency 07/02/2014    Past Surgical History:  Procedure Laterality Date  . COLONOSCOPY W/ POLYPECTOMY  LAST ONE 2013  . CORONARY STENT PLACEMENT  05/17/2014   PROXIMAL X 4  MID & DISTAL LAD  DIAGONAL   . KNEE ARTHROSCOPY  08/13/2011   right ARTHROSCOPY KNEE;  Surgeon: Gearlean Alf, MD;  Location: [facility];  Service: Orthopedics;   Laterality: Right;  medial  meniscal debridement, excision of plica, synovectomy  . KNEE ARTHROSCOPY Left 11/16/2013   Procedure: LEFT KNEE ARTHROSCOPY MEDIAL MENISCAL DEBRIDEMENT, CHONDROPLASTY;  Surgeon: Gearlean Alf, MD;  Location: [facility];  Service: Orthopedics;  Laterality: Left;  . KNEE ARTHROSCOPY W/ MENISCECTOMY Right 01-29-2011   twice, first trimmed mediscus and cyst  . LEFT HEART CATHETERIZATION WITH CORONARY ANGIOGRAM N/A 05/17/2014   Procedure: LEFT HEART CATHETERIZATION WITH CORONARY ANGIOGRAM;  Surgeon: Burnell Blanks, MD;  Location: [facility] CATH LAB;  Service: Cardiovascular;  Laterality: N/A;  . [place]   multiple, 10-12  . PERCUTANEOUS CORONARY STENT INTERVENTION (PCI-S)  05/17/2014   Procedure: PERCUTANEOUS CORONARY STENT INTERVENTION (PCI-S);  Surgeon: Burnell Blanks, MD;  Location: [facility] CATH LAB;  Service: Cardiovascular;;  Diag1 and Prox to Distal LAD    Family History  Problem Relation Age of Onset  . Hypertension Mother   . Cancer Mother 78       ovarian  . Heart disease Father        CHF  . Heart attack Father 48  . Cancer Sister        breast- just finished her last radiation  . Heart disease Maternal Grandfather        ?  [name] Heart attack Paternal Grandfather  44  . Heart attack Paternal Uncle 73       Sudden death  . Diabetes Neg Hx     Social History   Social History  . Marital status: Married    Spouse name: N/A  . Number of children: 2  . Years of education: N/A   Occupational History  . Not on file.   Social History Main Topics  . Smoking status: Former Smoker    Packs/day: 1.00    Years: 4.00    Types: Cigarettes    Quit date: 06/14/1967  . Smokeless tobacco: Never Used  . Alcohol use No  . Drug use: No  . Sexual activity: Yes     Comment: lives with wife   Other Topics Concern  . 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      Outpatient Medications Prior to  Visit  Medication Sig Dispense Refill  . ACCU-CHEK FASTCLIX LANCETS MISC Use to test blood sugar 2 times daily. Dx: E11.40 204 each 3  . acetaminophen (TYLENOL) 500 MG tablet Take 1,000 mg by mouth every 6 (six) hours as needed for mild pain. Reported on 03/31/2015    . aspirin EC 81 MG tablet Take 81 mg by mouth daily with breakfast.     . Cholecalciferol (VITAMIN D PO) Take 5,000 Units by mouth daily.    [name] glucose blood (ACCU-CHEK AVIVA PLUS) test strip Use to test blood sugar 2 times daily. Dx: E11.40 200 each 3  . insulin NPH Human (HUMULIN N) 100 UNIT/ML injection INJECT UNDER SKIN 20 UNITS IN THE AM AND 46 UNITS AT BEDTIME. 20 mL 3  . insulin regular (HUMULIN R) 100 units/mL injection Inject 0.26-0.35 mLs (26-35 Units total) into the skin 3 (three) times daily before meals. 30 mL 3  . Insulin Syringe-Needle U-100 (TRUEPLUS INSULIN SYRINGE) 30G X 5/16" 0.5 ML MISC USE TO INJECT INSULIN 5 TIMES DAILY. 200 each 3  . losartan (COZAAR) 50 MG tablet TAKE 1&1/2 (75 MG TOTAL) TABLETS BY MOUTH EVERY MORNING (Patient taking differently: Take 75 mg by mouth daily. TAKE 3 25MG  TABLETS BY MOUTH EVERY MORNING) 180 tablet 3  . metoprolol tartrate (LOPRESSOR) 25 MG tablet Take 1 tablet (25 mg total) by mouth 2 (two) times daily. 180 tablet 3  . Multiple Vitamin (MULTIVITAMIN WITH MINERALS) TABS tablet Take 1 tablet by mouth daily.    . nitroGLYCERIN (NITROSTAT) 0.4 MG SL tablet Place 1 tablet (0.4 mg total) under the tongue every 5 (five) minutes as needed for chest pain. 90 tablet 3  . Polyethylene Glycol 3350 (MIRALAX PO) Take by mouth as needed. Reported on 03/31/2015    . prasugrel (EFFIENT) 10 MG TABS tablet Take 1 tablet (10 mg total) by mouth daily. 90 tablet 3  . Probiotic Product (PROBIOTIC-10 PO) Take 1 tablet by mouth daily.    . simvastatin (ZOCOR) 40 MG tablet TAKE 1 TABLET (40 MG) BY MOUTH AT BEDTIME. 90 tablet 2  . oseltamivir (TAMIFLU) 75 MG capsule Take 1 capsule (75 mg total) by mouth daily.  (Patient not taking: Reported on 09/16/2016) 10 capsule 0   No facility-administered medications prior to visit.     Allergies  Allergen Reactions  . Ciprofloxacin Other (See Comments)    Abdominal pain  . Lipitor [Atorvastatin Calcium] Other (See Comments)    Severe muscle pain  . Lisinopril Cough  . Metformin And Related Diarrhea    Review of Systems  Constitutional: Negative for chills, fever and malaise/fatigue.  HENT: Negative for congestion and hearing loss.   Eyes: Negative for discharge.  Respiratory: Negative for cough, sputum production and shortness of breath.   Cardiovascular: Negative for chest pain, palpitations and leg swelling.  Gastrointestinal: Negative for abdominal pain, blood in stool, constipation, diarrhea, heartburn, nausea and vomiting.  Genitourinary: Negative for dysuria, frequency, hematuria and urgency.  Musculoskeletal: Negative for back pain, falls and myalgias.  Skin: Negative for rash.  Neurological: Negative for dizziness, sensory change, loss of consciousness, weakness and headaches.  Endo/Heme/Allergies: Negative for environmental allergies. Does not bruise/bleed easily.  Psychiatric/Behavioral: Negative for depression and suicidal ideas. The patient is not nervous/anxious and does not have insomnia.        Objective:    Physical Exam  Constitutional: He is oriented to person, place, and time. He appears well-developed and well-nourished. No distress.  HENT:  Head: Normocephalic and atraumatic.  Eyes: Conjunctivae are normal.  Neck: Neck supple. No thyromegaly present.  Cardiovascular: Normal rate, regular rhythm and normal heart sounds.   No murmur heard. Pulmonary/Chest: Effort normal and breath sounds normal. No respiratory distress. He has no wheezes.  Abdominal: Soft. Bowel sounds are normal. He exhibits no mass. There is no tenderness.  Musculoskeletal: He exhibits no edema.  Lymphadenopathy:    He has no cervical adenopathy.    Neurological: He is alert and oriented to person, place, and time.  Skin: Skin is warm and dry.  Psychiatric: He has a normal mood and affect. His behavior is normal.    BP 128/68   Pulse (!) 52   Temp 98.2 F (36.8 C) (Oral)   Resp 16   Ht 5' 8.5" (1.74 m)   Wt 261 lb 6.4 oz (118.6 kg)   SpO2 98%   BMI 39.17 kg/m  Wt Readings from Last 3 Encounters:  09/16/16 261 lb 6.4 oz (118.6 kg)  05/14/16 261 lb (118.4 kg)  05/04/16 259 lb 6.4 oz (117.7 kg)     Lab Results  Component Value Date   WBC 5.7 04/23/2016   HGB 14.5 04/23/2016   HCT 43.0 04/23/2016   PLT 169.0 04/23/2016   GLUCOSE 169 (H) 04/23/2016   CHOL 144 12/25/2015   TRIG 190.0 (H) 12/25/2015   HDL 34.10 (L) 12/25/2015   LDLDIRECT 108.0 04/16/2014   LDLCALC 72 12/25/2015   ALT 24 04/23/2016   AST 20 04/23/2016   NA 137 04/23/2016   K 4.7 04/23/2016   CL 102 04/23/2016   CREATININE 0.90 04/23/2016   BUN 14 04/23/2016   CO2 28 04/23/2016   TSH 2.31 04/23/2016   PSA 1.24 07/24/2014   INR 1.02 05/17/2014   HGBA1C 7.3 (H) 04/23/2016   MICROALBUR 1.7 06/24/2015    Lab Results  Component Value Date   TSH 2.31 04/23/2016   Lab Results  Component Value Date   WBC 5.7 04/23/2016   HGB 14.5 04/23/2016   HCT 43.0 04/23/2016   MCV 93.2 04/23/2016   PLT 169.0 04/23/2016   Lab Results  Component Value Date   NA 137 04/23/2016   K 4.7 04/23/2016   CO2 28 04/23/2016   GLUCOSE 169 (H) 04/23/2016   BUN 14 04/23/2016   CREATININE 0.90 04/23/2016   BILITOT 0.7 04/23/2016   ALKPHOS 77 04/23/2016   AST 20 04/23/2016   ALT 24 04/23/2016   PROT 6.8 04/23/2016   ALBUMIN 4.2 04/23/2016   CALCIUM 9.1 04/23/2016   ANIONGAP 8 05/18/2014   GFR 88.21  04/23/2016   Lab Results  Component Value Date   CHOL 144 12/25/2015   Lab Results  Component Value Date   HDL 34.10 (L) 12/25/2015   Lab Results  Component Value Date   LDLCALC 72 12/25/2015   Lab Results  Component Value Date   TRIG 190.0 (H)  12/25/2015   Lab Results  Component Value Date   CHOLHDL 4 12/25/2015   Lab Results  Component Value Date   HGBA1C 7.3 (H) 04/23/2016       Assessment & Plan:   Problem List Items Addressed This Visit    Hypertension (Chronic)    Adequate controlled, no changes to meds. Encouraged heart healthy diet such as the DASH diet and exercise as tolerated.       Relevant Orders   CBC with Differential/Platelet   Comprehensive metabolic panel   TSH   Hyperlipidemia    Encouraged heart healthy diet, increase exercise, avoid trans fats, consider a krill oil cap daily      Relevant Orders   Lipid panel   Obesity    Encouraged DASH diet, decrease po intake and increase exercise as tolerated. Needs 7-8 hours of sleep nightly. Avoid trans fats, eat small, frequent meals every 4-5 hours with lean proteins, complex carbs and healthy fats. Minimize simple carbs, bariatric referral      Vitamin D deficiency   Relevant Orders   VITAMIN D 25 Hydroxy (Vit-D Deficiency, Fractures)   Preventative health care    Patient encouraged to maintain heart healthy diet, regular exercise, adequate sleep. Consider daily probiotics. Take medications as prescribed      Well controlled type 2 diabetes mellitus with peripheral neuropathy (HCC)    hgba1c acceptable, minimize simple carbs. Increase exercise as tolerated. Continue current meds      Relevant Orders   Hemoglobin A1c   CBC with Differential/Platelet   TSH    Other Visit Diagnoses    Encounter for Medicare annual wellness exam    -  Primary      I have discontinued Mr. Dinkel's oseltamivir. I am also having him maintain his aspirin EC, multivitamin with minerals, acetaminophen, nitroGLYCERIN, Polyethylene Glycol 3350 (MIRALAX PO), prasugrel, losartan, metoprolol tartrate, ACCU-CHEK FASTCLIX LANCETS, glucose blood, simvastatin, Insulin Syringe-Needle U-100, Probiotic Product (PROBIOTIC-10 PO), insulin NPH Human, insulin regular, and  Cholecalciferol (VITAMIN D PO).  No orders of the defined types were placed in this encounter.    Penni Homans, MD

## 2016-10-11 DIAGNOSIS — L80 Vitiligo: Secondary | ICD-10-CM | POA: Diagnosis not present

## 2016-10-11 DIAGNOSIS — I872 Venous insufficiency (chronic) (peripheral): Secondary | ICD-10-CM | POA: Diagnosis not present

## 2016-10-11 DIAGNOSIS — D1800 Hemangioma unspecified site: Secondary | ICD-10-CM | POA: Diagnosis not present

## 2016-10-11 DIAGNOSIS — D225 Melanocytic nevi of trunk: Secondary | ICD-10-CM | POA: Diagnosis not present

## 2016-10-11 DIAGNOSIS — L821 Other seborrheic keratosis: Secondary | ICD-10-CM | POA: Diagnosis not present

## 2016-10-11 DIAGNOSIS — L814 Other melanin hyperpigmentation: Secondary | ICD-10-CM | POA: Diagnosis not present

## 2016-10-11 DIAGNOSIS — L570 Actinic keratosis: Secondary | ICD-10-CM | POA: Diagnosis not present

## 2016-10-14 DIAGNOSIS — G4733 Obstructive sleep apnea (adult) (pediatric): Secondary | ICD-10-CM | POA: Diagnosis not present

## 2016-10-15 ENCOUNTER — Encounter: Payer: Self-pay | Admitting: Family Medicine

## 2016-10-18 ENCOUNTER — Encounter: Payer: Self-pay | Admitting: Family Medicine

## 2016-10-18 ENCOUNTER — Ambulatory Visit: Payer: Medicare HMO | Admitting: Family Medicine

## 2016-10-18 ENCOUNTER — Ambulatory Visit (INDEPENDENT_AMBULATORY_CARE_PROVIDER_SITE_OTHER): Payer: Medicare HMO | Admitting: Family Medicine

## 2016-10-18 DIAGNOSIS — G4733 Obstructive sleep apnea (adult) (pediatric): Secondary | ICD-10-CM | POA: Diagnosis not present

## 2016-10-18 DIAGNOSIS — I10 Essential (primary) hypertension: Secondary | ICD-10-CM | POA: Diagnosis not present

## 2016-10-18 DIAGNOSIS — Z77098 Contact with and (suspected) exposure to other hazardous, chiefly nonmedicinal, chemicals: Secondary | ICD-10-CM | POA: Diagnosis not present

## 2016-10-18 DIAGNOSIS — I2510 Atherosclerotic heart disease of native coronary artery without angina pectoris: Secondary | ICD-10-CM | POA: Diagnosis not present

## 2016-10-18 DIAGNOSIS — E669 Obesity, unspecified: Secondary | ICD-10-CM | POA: Diagnosis not present

## 2016-10-18 DIAGNOSIS — E1142 Type 2 diabetes mellitus with diabetic polyneuropathy: Secondary | ICD-10-CM

## 2016-10-18 DIAGNOSIS — H52203 Unspecified astigmatism, bilateral: Secondary | ICD-10-CM | POA: Diagnosis not present

## 2016-10-18 DIAGNOSIS — H35372 Puckering of macula, left eye: Secondary | ICD-10-CM | POA: Diagnosis not present

## 2016-10-18 DIAGNOSIS — E119 Type 2 diabetes mellitus without complications: Secondary | ICD-10-CM | POA: Diagnosis not present

## 2016-10-18 HISTORY — DX: Contact with and (suspected) exposure to other hazardous, chiefly nonmedicinal, chemicals: Z77.098

## 2016-10-18 LAB — HM DIABETES EYE EXAM

## 2016-10-18 NOTE — Assessment & Plan Note (Signed)
Using CPAP nightly 

## 2016-10-18 NOTE — Assessment & Plan Note (Signed)
Follows with Dr Percival Spanish. asymptomatic

## 2016-10-18 NOTE — Assessment & Plan Note (Signed)
Encouraged DASH diet, decrease po intake and increase exercise as tolerated. Needs 7-8 hours of sleep nightly. Avoid trans fats, eat small, frequent meals every 4-5 hours with lean proteins, complex carbs and healthy fats. Minimize simple carbs 

## 2016-10-18 NOTE — Assessment & Plan Note (Signed)
Well controlled, no changes to meds. Encouraged heart healthy diet such as the DASH diet and exercise as tolerated.  °

## 2016-10-18 NOTE — Assessment & Plan Note (Signed)
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 2 letters are written today. First letter confirms that history of coronary artery disease and diabetes mellitus type 2 can contribute to the development of hypertension. The second letter confirms that a history of coronary artery disease and diabetes taken treatment to the development of obesity and ultimately to sleep apnea.

## 2016-10-18 NOTE — Patient Instructions (Signed)

## 2016-10-18 NOTE — Assessment & Plan Note (Signed)
hgba1c acceptable, minimize simple carbs. Increase exercise as tolerated. Continue current meds 

## 2016-10-18 NOTE — Progress Notes (Signed)
Subjective:  I acted as a Education administrator for Dr. Charlett Blake. [place], [place]   Patient ID: Michael Cunningham, male    DOB: 03-Feb-1946, 71 y.o.   MRN: [id number]  No chief complaint on file.   HPI  Patient is in today for a follow up. He feels well today but has a complicated past medical History which includes obesity, diabetes, hypertension, hyperlipidemia, sleep apnea, agent orange exposure in [place], peripheral neuropathy and more. He is currently working to have his service connection benefits evaluated by the [place] and is requesting letters to support his case. He denies any recent febrile illness or acute complaints otherwise. No recent hospitalization. Does not endorse polyuria or polydipsia. Denies CP/palp/SOB/HA/congestion/fevers/GI or GU c/o. Taking meds as prescribed  Patient Care Team: Mosie Lukes, MD as PCP - General (Family Medicine) Minus Breeding, MD as Consulting Physician (Cardiology) Philemon Kingdom, MD as Consulting Physician (Endocrinology) Gean Birchwood, DPM as Consulting Physician (Podiatry) Gaynelle Arabian, MD as Consulting Physician (Orthopedic Surgery) Troy Sine, MD as Consulting Physician (Cardiology) Luberta Mutter, MD as Consulting Physician (Ophthalmology) Clarene Essex, MD as Consulting Physician (Gastroenterology) [place], Jennefer Bravo, MD as Consulting Physician (Dermatology)   Past Medical History:  Diagnosis Date  . CAD (coronary artery disease)    a. 05/2014 [place] s/p DES x4 to mid-distLAD and DESx1 to DI  . Complication of anesthesia    11'12 Surgery with excrutiating "head about to explode" upon awakening  -lasted several hours postop  ( SURGERY DONE 08-13-2011 AT [facility] PT DID OK POST OP)  . Diverticulosis of colon    LEFT SIDE  . Exposure to Agent [facility] 10/18/2016   [place]  . History of small bowel obstruction    X2  28 YRS AGO AND LAST ONE 04/2014--  RESOLVED WITHOUT SURGICAL INTERVENTION  . Hx of small bowel obstruction 06/14/2011   Follows with  Dr Watt Climes   . Hyperlipidemia   . Hypertension   . Knee pain, bilateral 08/13/2011  . Left ureteral calculus   . Measles =  . Multiple lipomas    hx. mulitple and some remains -arms, legs  . Obesity, unspecified 09/18/2013  . Obstructive sleep apnea 01/01/2016  . Recurrent kidney stones 07/08/2013   Follows with Dr Gaynelle Arabian He believes they are Calcium oxalate stones  . Thrombocytopenia ([place]) 07/07/2014  . Type 2 diabetes mellitus ([place])   . Vitamin D deficiency 07/02/2014    Past Surgical History:  Procedure Laterality Date  . COLONOSCOPY W/ POLYPECTOMY  LAST ONE 2013  . CORONARY STENT PLACEMENT  05/17/2014   PROXIMAL X 4  MID & DISTAL LAD  DIAGONAL   . KNEE ARTHROSCOPY  08/13/2011   right ARTHROSCOPY KNEE;  Surgeon: Gearlean Alf, MD;  Location: [facility];  Service: Orthopedics;  Laterality: Right;  medial  meniscal debridement, excision of plica, synovectomy  . KNEE ARTHROSCOPY Left 11/16/2013   Procedure: LEFT KNEE ARTHROSCOPY MEDIAL MENISCAL DEBRIDEMENT, CHONDROPLASTY;  Surgeon: Gearlean Alf, MD;  Location: [facility];  Service: Orthopedics;  Laterality: Left;  . KNEE ARTHROSCOPY W/ MENISCECTOMY Right 01-29-2011   twice, first trimmed mediscus and cyst  . LEFT HEART CATHETERIZATION WITH CORONARY ANGIOGRAM N/A 05/17/2014   Procedure: LEFT HEART CATHETERIZATION WITH CORONARY ANGIOGRAM;  Surgeon: Burnell Blanks, MD;  Location: [facility] CATH LAB;  Service: Cardiovascular;  Laterality: N/A;  . [place]   multiple, 10-12  . PERCUTANEOUS CORONARY STENT INTERVENTION (PCI-S)  05/17/2014   Procedure: PERCUTANEOUS CORONARY STENT INTERVENTION (PCI-S);  Surgeon: Burnell Blanks, MD;  Location: [facility] CATH LAB;  Service: Cardiovascular;;  Diag1 and Prox to Distal LAD    Family History  Problem Relation Age of Onset  . Hypertension Mother   . Cancer Mother 81       ovarian  . Heart disease Father        CHF  . Heart attack Father 69  . Cancer Sister        breast- just finished  her last radiation  . Heart disease Maternal Grandfather        ?  [name] Heart attack Paternal Grandfather 52  . Heart attack Paternal Uncle 66       Sudden death  . Diabetes Neg Hx     Social History   Social History  . Marital status: Married    Spouse name: N/A  . Number of children: 2  . Years of education: N/A   Occupational History  . Not on file.   Social History Main Topics  . Smoking status: Former Smoker    Packs/day: 1.00    Years: 4.00    Types: Cigarettes    Quit date: 06/14/1967  . Smokeless tobacco: Never Used  . Alcohol use No  . Drug use: No  . Sexual activity: Yes     Comment: lives with wife   Other Topics Concern  . 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      Outpatient Medications Prior to Visit  Medication Sig Dispense Refill  . ACCU-CHEK FASTCLIX LANCETS MISC Use to test blood sugar 2 times daily. Dx: E11.40 204 each 3  . acetaminophen (TYLENOL) 500 MG tablet Take 1,000 mg by mouth every 6 (six) hours as needed for mild pain. Reported on 03/31/2015    . aspirin EC 81 MG tablet Take 81 mg by mouth daily with breakfast.     . Cholecalciferol (VITAMIN D PO) Take 5,000 Units by mouth daily.    [name] glucose blood (ACCU-CHEK AVIVA PLUS) test strip Use to test blood sugar 2 times daily. Dx: E11.40 200 each 3  . insulin NPH Human (HUMULIN N) 100 UNIT/ML injection INJECT UNDER SKIN 20 UNITS IN THE AM AND 46 UNITS AT BEDTIME. 20 mL 3  . insulin regular (HUMULIN R) 100 units/mL injection Inject 0.26-0.35 mLs (26-35 Units total) into the skin 3 (three) times daily before meals. 30 mL 3  . Insulin Syringe-Needle U-100 (TRUEPLUS INSULIN SYRINGE) 30G X 5/16" 0.5 ML MISC USE TO INJECT INSULIN 5 TIMES DAILY. 200 each 3  . losartan (COZAAR) 50 MG tablet TAKE 1&1/2 (75 MG TOTAL) TABLETS BY MOUTH EVERY MORNING (Patient taking differently: Take 75 mg by mouth daily. TAKE 3 25MG  TABLETS BY MOUTH EVERY MORNING) 180 tablet 3    . metoprolol tartrate (LOPRESSOR) 25 MG tablet Take 1 tablet (25 mg total) by mouth 2 (two) times daily. 180 tablet 3  . Multiple Vitamin (MULTIVITAMIN WITH MINERALS) TABS tablet Take 1 tablet by mouth daily.    . nitroGLYCERIN (NITROSTAT) 0.4 MG SL tablet Place 1 tablet (0.4 mg total) under the tongue every 5 (five) minutes as needed for chest pain. 90 tablet 3  . Polyethylene Glycol 3350 (MIRALAX PO) Take by mouth as needed. Reported on 03/31/2015    . prasugrel (EFFIENT) 10 MG TABS tablet Take 1 tablet (10 mg total) by mouth daily. 90 tablet 3  . Probiotic Product (PROBIOTIC-10 PO) Take 1 tablet by  mouth daily.    . simvastatin (ZOCOR) 40 MG tablet TAKE 1 TABLET (40 MG) BY MOUTH AT BEDTIME. 90 tablet 2   No facility-administered medications prior to visit.     Allergies  Allergen Reactions  . Ciprofloxacin Other (See Comments)    Abdominal pain  . Lipitor [Atorvastatin Calcium] Other (See Comments)    Severe muscle pain  . Lisinopril Cough  . Metformin And Related Diarrhea    Review of Systems  Constitutional: Positive for malaise/fatigue. Negative for fever.  HENT: Negative for congestion.   Eyes: Negative for blurred vision.  Respiratory: Negative for cough and shortness of breath.   Cardiovascular: Negative for chest pain, palpitations and leg swelling.  Gastrointestinal: Negative for vomiting.  Musculoskeletal: Negative for back pain.  Skin: Negative for rash.  Neurological: Negative for loss of consciousness and headaches.       Objective:    Physical Exam  Constitutional: He is oriented to person, place, and time. He appears well-developed and well-nourished. No distress.  HENT:  Head: Normocephalic and atraumatic.  Eyes: Conjunctivae are normal.  Neck: Normal range of motion. No thyromegaly present.  Cardiovascular: Normal rate and regular rhythm.   Pulmonary/Chest: Effort normal and breath sounds normal. He has no wheezes.  Abdominal: Soft. Bowel sounds are  normal. There is no tenderness.  Musculoskeletal: Normal range of motion. He exhibits no edema or deformity.  Neurological: He is alert and oriented to person, place, and time.  Skin: Skin is warm and dry. He is not diaphoretic.  Psychiatric: He has a normal mood and affect.    BP 124/60 (BP Location: Left Arm, Patient Position: Sitting, Cuff Size: Normal)   Pulse 60   Temp 97.8 F (36.6 C) (Oral)   Resp 18   Wt 262 lb 6.4 oz (119 kg)   SpO2 98%   BMI 39.32 kg/m  Wt Readings from Last 3 Encounters:  10/18/16 262 lb 6.4 oz (119 kg)  09/16/16 261 lb 6.4 oz (118.6 kg)  05/14/16 261 lb (118.4 kg)   BP Readings from Last 3 Encounters:  10/18/16 124/60  09/16/16 128/68  05/26/16 134/78     Immunization History  Administered Date(s) Administered  . Hepatitis A 10/18/2003  . Hepatitis B 10/18/2003, 11/29/2003  . Influenza, High Dose Seasonal PF 12/19/2015  . Influenza,inj,Quad PF,36+ Mos 01/10/2014, 12/26/2014  . Influenza-Unspecified 11/19/2010, 12/03/2011, 12/12/2012  . Mumps 01/29/2004  . Pneumococcal Conjugate-13 01/10/2014  . Pneumococcal Polysaccharide-23 10/18/2003, 03/31/2015  . Td 05/13/1997, 05/21/2009  . Tdap 12/18/2015  . Zoster 06/06/2009    Health Maintenance  Topic Date Due  . FOOT EXAM  06/30/2016  . INFLUENZA VACCINE  10/13/2016  . OPHTHALMOLOGY EXAM  10/13/2016  . HEMOGLOBIN A1C  01/19/2017  . COLONOSCOPY  07/13/2021  . TETANUS/TDAP  12/17/2025  . Hepatitis C Screening  Completed  . PNA vac Low Risk Adult  Completed    Lab Results  Component Value Date   WBC 5.7 04/23/2016   HGB 14.5 04/23/2016   HCT 43.0 04/23/2016   PLT 169.0 04/23/2016   GLUCOSE 169 (H) 04/23/2016   CHOL 144 12/25/2015   TRIG 190.0 (H) 12/25/2015   HDL 34.10 (L) 12/25/2015   LDLDIRECT 108.0 04/16/2014   LDLCALC 72 12/25/2015   ALT 24 04/23/2016   AST 20 04/23/2016   NA 137 04/23/2016   K 4.7 04/23/2016   CL 102 04/23/2016   CREATININE 0.90 04/23/2016   BUN 14  04/23/2016   CO2 28 04/23/2016  TSH 2.31 04/23/2016   PSA 1.24 07/24/2014   INR 1.02 05/17/2014   HGBA1C 7.3 (H) 04/23/2016   MICROALBUR 1.7 06/24/2015    Lab Results  Component Value Date   TSH 2.31 04/23/2016   Lab Results  Component Value Date   WBC 5.7 04/23/2016   HGB 14.5 04/23/2016   HCT 43.0 04/23/2016   MCV 93.2 04/23/2016   PLT 169.0 04/23/2016   Lab Results  Component Value Date   NA 137 04/23/2016   K 4.7 04/23/2016   CO2 28 04/23/2016   GLUCOSE 169 (H) 04/23/2016   BUN 14 04/23/2016   CREATININE 0.90 04/23/2016   BILITOT 0.7 04/23/2016   ALKPHOS 77 04/23/2016   AST 20 04/23/2016   ALT 24 04/23/2016   PROT 6.8 04/23/2016   ALBUMIN 4.2 04/23/2016   CALCIUM 9.1 04/23/2016   ANIONGAP 8 05/18/2014   GFR 88.21 04/23/2016   Lab Results  Component Value Date   CHOL 144 12/25/2015   Lab Results  Component Value Date   HDL 34.10 (L) 12/25/2015   Lab Results  Component Value Date   LDLCALC 72 12/25/2015   Lab Results  Component Value Date   TRIG 190.0 (H) 12/25/2015   Lab Results  Component Value Date   CHOLHDL 4 12/25/2015   Lab Results  Component Value Date   HGBA1C 7.3 (H) 04/23/2016         Assessment & Plan:   Problem List Items Addressed This Visit    Hypertension (Chronic)    Well controlled, no changes to meds. Encouraged heart healthy diet such as the DASH diet and exercise as tolerated.       Obesity    Encouraged DASH diet, decrease po intake and increase exercise as tolerated. Needs 7-8 hours of sleep nightly. Avoid trans fats, eat small, frequent meals every 4-5 hours with lean proteins, complex carbs and healthy fats. Minimize simple carbs      CAD (coronary artery disease)    Follows with Dr Percival Spanish. asymptomatic      Well controlled type 2 diabetes mellitus with peripheral neuropathy (HCC)    hgba1c acceptable, minimize simple carbs. Increase exercise as tolerated. Continue current meds      Obstructive sleep  apnea    Using CPAP nightly      Exposure to Agent Orange    He is currently working with [place] for service connection of benefits. 2 letters are written today. First letter confirms that history of coronary artery disease and diabetes mellitus type 2 can contribute to the development of hypertension. The second letter confirms that a history of coronary artery disease and diabetes taken treatment to the development of obesity and ultimately to sleep apnea.         I am having Mr. Thielen maintain his aspirin EC, multivitamin with minerals, acetaminophen, nitroGLYCERIN, Polyethylene Glycol 3350 (MIRALAX PO), prasugrel, losartan, metoprolol tartrate, ACCU-CHEK FASTCLIX LANCETS, glucose blood, simvastatin, Insulin Syringe-Needle U-100, Probiotic Product (PROBIOTIC-10 PO), insulin NPH Human, insulin regular, and Cholecalciferol (VITAMIN D PO).  No orders of the defined types were placed in this encounter.   CMA served as Education administrator during this visit. History, Physical and Plan performed by medical provider. Documentation and orders reviewed and attested to.  Penni Homans, MD

## 2016-11-19 ENCOUNTER — Encounter: Payer: Self-pay | Admitting: Family Medicine

## 2016-11-23 ENCOUNTER — Encounter: Payer: Self-pay | Admitting: Family Medicine

## 2016-11-23 NOTE — Progress Notes (Signed)
Sent letter for patient

## 2016-11-29 ENCOUNTER — Ambulatory Visit (INDEPENDENT_AMBULATORY_CARE_PROVIDER_SITE_OTHER): Payer: Medicare HMO | Admitting: Podiatry

## 2016-11-29 ENCOUNTER — Encounter: Payer: Self-pay | Admitting: Podiatry

## 2016-11-29 DIAGNOSIS — E119 Type 2 diabetes mellitus without complications: Secondary | ICD-10-CM | POA: Diagnosis not present

## 2016-11-29 DIAGNOSIS — B351 Tinea unguium: Secondary | ICD-10-CM | POA: Diagnosis not present

## 2016-11-29 DIAGNOSIS — M79676 Pain in unspecified toe(s): Secondary | ICD-10-CM | POA: Diagnosis not present

## 2016-11-29 NOTE — Progress Notes (Signed)
Patient ID: Michael Cunningham, male   DOB: [birth year], 71 y.o.   MRN: [id number]    Subjective: This patient presents today complaining of elongated and thickened toenails for term comfortable walking wearing shoes and requests nail debridement  Objective: Orientated 3 Mild nonpitting peripheral edema bilaterally DP and PT pulses 2/4 bilaterally Capillary reflex immediate bilaterally Sensation to 10 g monofilament wire intact 5/5 bilaterally Vibratory sensation reactive bilaterally Ankle reflex equal and reactive bilaterally No open skin lesions bilaterally The toenails are elongated, discolored, hypertrophic, deformed and tender to direct palpation 6-10 Manual motor testing dorsi flexion, plantar flexion, inversion, eversion 5/5 bilaterally There is no restriction in the ankle, subtalar, midtarsal joints bilaterally  Assessment: Type II diabeticwith satisfactory neurovascular status Symptomatic onychomycoses 6-10  Plan: Debridement toenails 6-10 mechanically an electrical without any bleeding  Reappoint 3 months

## 2016-11-29 NOTE — Patient Instructions (Signed)

## 2016-12-01 ENCOUNTER — Ambulatory Visit: Payer: Medicare HMO | Admitting: Podiatry

## 2016-12-17 ENCOUNTER — Ambulatory Visit (INDEPENDENT_AMBULATORY_CARE_PROVIDER_SITE_OTHER): Payer: Medicare HMO | Admitting: Family Medicine

## 2016-12-17 ENCOUNTER — Encounter: Payer: Self-pay | Admitting: Family Medicine

## 2016-12-17 VITALS — BP 132/82 | HR 71 | Temp 98.5°F | Ht 69.0 in | Wt 264.0 lb

## 2016-12-17 DIAGNOSIS — B9789 Other viral agents as the cause of diseases classified elsewhere: Secondary | ICD-10-CM | POA: Diagnosis not present

## 2016-12-17 DIAGNOSIS — J069 Acute upper respiratory infection, unspecified: Secondary | ICD-10-CM

## 2016-12-17 MED ORDER — BENZONATATE 100 MG PO CAPS: 100.0000 mg | ORAL_CAPSULE | Freq: Three times a day (TID) | ORAL | 0 refills | Status: DC | PRN

## 2016-12-17 NOTE — Progress Notes (Signed)
Pre visit review using our clinic review tool, if applicable. No additional management support is needed unless otherwise documented below in the visit note. 

## 2016-12-17 NOTE — Progress Notes (Signed)
Chief Complaint  Patient presents with  . Cough    congestion    Michael Cunningham here for URI complaints.  Duration: 5 days  Associated symptoms: sinus congestion, rhinorrhea, itchy watery eyes and fevers/rigors Denies: sinus pain, ear pain, ear drainage, sore throat, wheezing, shortness of breath, myalgia and cough Treatment to date: Tylenol, soup, fluids Sick contacts: Yes  ROS:  Const: Denies fevers HEENT: As noted in HPI Lungs: No SOB  Past Medical History:  Diagnosis Date  . CAD (coronary artery disease)    a. 05/2014 [place] s/p DES x4 to mid-distLAD and DESx1 to DI  . Complication of anesthesia    11'12 Surgery with excrutiating "head about to explode" upon awakening  -lasted several hours postop  ( SURGERY DONE 08-13-2011 AT [facility] PT DID OK POST OP)  . Diverticulosis of colon    LEFT SIDE  . Exposure to Agent [facility] 10/18/2016   [place]  . History of small bowel obstruction    X2  28 YRS AGO AND LAST ONE 04/2014--  RESOLVED WITHOUT SURGICAL INTERVENTION  . Hx of small bowel obstruction 06/14/2011   Follows with Dr Watt Climes   . Hyperlipidemia   . Hypertension   . Knee pain, bilateral 08/13/2011  . Left ureteral calculus   . Measles =  . Multiple lipomas    hx. mulitple and some remains -arms, legs  . Obesity, unspecified 09/18/2013  . Obstructive sleep apnea 01/01/2016  . Recurrent kidney stones 07/08/2013   Follows with Dr Gaynelle Arabian He believes they are Calcium oxalate stones  . Thrombocytopenia ([place]) 07/07/2014  . Type 2 diabetes mellitus ([place])   . Vitamin D deficiency 07/02/2014   Family History  Problem Relation Age of Onset  . Hypertension Mother   . Cancer Mother 70       ovarian  . Heart disease Father        CHF  . Heart attack Father 22  . Cancer Sister        breast- just finished her last radiation  . Heart disease Maternal Grandfather        ?  [name] Heart attack Paternal Grandfather 86  . Heart attack Paternal Uncle 75       Sudden death  . Diabetes Neg Hx      BP 132/82 (BP Location: Left Arm, Patient Position: Sitting, Cuff Size: Large)   Pulse 71   Temp 98.5 F (36.9 C) (Oral)   Ht 5\' 9"  (1.753 m)   Wt 264 lb (119.7 kg)   SpO2 95%   BMI 38.99 kg/m  General: Awake, alert, appears stated age HEENT: AT, [place], ears patent b/l and TM's neg, nares patent w/o discharge, pharynx pink and without exudates, MMM Neck: No masses or asymmetry Heart: RRR, no murmurs, no bruits Lungs: CTAB, no accessory muscle use Psych: Age appropriate judgment and insight, normal mood and affect  Viral URI with cough - Plan: benzonatate (TESSALON) 100 MG capsule  Orders as above. Continue to push fluids, practice good hand hygiene, cover mouth when coughing. F/u prn. If starting to experience fevers, shaking, or shortness of breath, seek immediate care. Pt voiced understanding and agreement to the plan.  [place], DO 12/17/16 12:10 PM

## 2016-12-17 NOTE — Patient Instructions (Signed)
Continue to push fluids, practice good hand hygiene, and cover your mouth if you cough.  If you start having fevers, shaking or shortness of breath, seek immediate care.  Send me a MyChart message if you are not getting better by Wednesday morning.  Let us know if you need anything.

## 2016-12-28 ENCOUNTER — Telehealth: Payer: Self-pay | Admitting: Family Medicine

## 2016-12-28 NOTE — Telephone Encounter (Signed)
Pt came in office and dropped off a copy of his flu shot done at [facility] (1 page with front and back). Pt left copy to have it on his chart. Document put at front office tray.

## 2017-02-09 ENCOUNTER — Other Ambulatory Visit (INDEPENDENT_AMBULATORY_CARE_PROVIDER_SITE_OTHER): Payer: Medicare HMO

## 2017-02-09 DIAGNOSIS — I10 Essential (primary) hypertension: Secondary | ICD-10-CM

## 2017-02-09 DIAGNOSIS — E1142 Type 2 diabetes mellitus with diabetic polyneuropathy: Secondary | ICD-10-CM | POA: Diagnosis not present

## 2017-02-09 DIAGNOSIS — E559 Vitamin D deficiency, unspecified: Secondary | ICD-10-CM | POA: Diagnosis not present

## 2017-02-09 DIAGNOSIS — E782 Mixed hyperlipidemia: Secondary | ICD-10-CM | POA: Diagnosis not present

## 2017-02-09 LAB — CBC WITH DIFFERENTIAL/PLATELET
BASOS PCT: 0.6 % (ref 0.0–3.0)
Basophils Absolute: 0 10*3/uL (ref 0.0–0.1)
EOS PCT: 2.4 % (ref 0.0–5.0)
Eosinophils Absolute: 0.1 10*3/uL (ref 0.0–0.7)
HEMATOCRIT: 42.5 % (ref 39.0–52.0)
HEMOGLOBIN: 14.4 g/dL (ref 13.0–17.0)
LYMPHS PCT: 35.2 % (ref 12.0–46.0)
Lymphs Abs: 2.1 10*3/uL (ref 0.7–4.0)
MCHC: 33.8 g/dL (ref 30.0–36.0)
MCV: 94.4 fl (ref 78.0–100.0)
MONOS PCT: 7.5 % (ref 3.0–12.0)
Monocytes Absolute: 0.5 10*3/uL (ref 0.1–1.0)
NEUTROS ABS: 3.3 10*3/uL (ref 1.4–7.7)
Neutrophils Relative %: 54.3 % (ref 43.0–77.0)
PLATELETS: 164 10*3/uL (ref 150.0–400.0)
RBC: 4.5 Mil/uL (ref 4.22–5.81)
RDW: 13.9 % (ref 11.5–15.5)
WBC: 6.1 10*3/uL (ref 4.0–10.5)

## 2017-02-09 LAB — HEMOGLOBIN A1C: Hgb A1c MFr Bld: 7.6 % — ABNORMAL HIGH (ref 4.6–6.5)

## 2017-02-09 LAB — COMPREHENSIVE METABOLIC PANEL
ALT: 23 U/L (ref 0–53)
AST: 19 U/L (ref 0–37)
Albumin: 3.9 g/dL (ref 3.5–5.2)
Alkaline Phosphatase: 77 U/L (ref 39–117)
BUN: 15 mg/dL (ref 6–23)
CALCIUM: 9 mg/dL (ref 8.4–10.5)
CHLORIDE: 102 meq/L (ref 96–112)
CO2: 28 meq/L (ref 19–32)
CREATININE: 0.84 mg/dL (ref 0.40–1.50)
GFR: 95.3 mL/min (ref >60.00)
Glucose, Bld: 133 mg/dL — ABNORMAL HIGH (ref 70–99)
POTASSIUM: 4.2 meq/L (ref 3.5–5.1)
Sodium: 137 mEq/L (ref 135–145)
Total Bilirubin: 0.7 mg/dL (ref 0.2–1.2)
Total Protein: 6.9 g/dL (ref 6.0–8.3)

## 2017-02-09 LAB — LIPID PANEL
CHOL/HDL RATIO: 4
Cholesterol: 134 mg/dL (ref 0–200)
HDL: 31.5 mg/dL — ABNORMAL LOW (ref >39.00)
LDL CALC: 75 mg/dL (ref 0–99)
NonHDL: 102.44
TRIGLYCERIDES: 137 mg/dL (ref 0.0–149.0)
VLDL: 27.4 mg/dL (ref 0.0–40.0)

## 2017-02-09 LAB — TSH: TSH: 2.43 u[IU]/mL (ref 0.35–4.50)

## 2017-02-09 LAB — VITAMIN D 25 HYDROXY (VIT D DEFICIENCY, FRACTURES): VITD: 38.41 ng/mL (ref 30.00–100.00)

## 2017-02-18 ENCOUNTER — Ambulatory Visit (INDEPENDENT_AMBULATORY_CARE_PROVIDER_SITE_OTHER): Payer: Medicare HMO | Admitting: Family Medicine

## 2017-02-18 ENCOUNTER — Encounter: Payer: Self-pay | Admitting: Family Medicine

## 2017-02-18 VITALS — BP 120/55 | HR 73 | Temp 97.6°F | Resp 16 | Ht 68.5 in | Wt 264.0 lb

## 2017-02-18 DIAGNOSIS — M25541 Pain in joints of right hand: Secondary | ICD-10-CM | POA: Diagnosis not present

## 2017-02-18 DIAGNOSIS — E782 Mixed hyperlipidemia: Secondary | ICD-10-CM

## 2017-02-18 DIAGNOSIS — Z0001 Encounter for general adult medical examination with abnormal findings: Secondary | ICD-10-CM | POA: Diagnosis not present

## 2017-02-18 DIAGNOSIS — I10 Essential (primary) hypertension: Secondary | ICD-10-CM

## 2017-02-18 DIAGNOSIS — M79642 Pain in left hand: Secondary | ICD-10-CM

## 2017-02-18 DIAGNOSIS — E669 Obesity, unspecified: Secondary | ICD-10-CM

## 2017-02-18 DIAGNOSIS — M79641 Pain in right hand: Secondary | ICD-10-CM

## 2017-02-18 DIAGNOSIS — M25542 Pain in joints of left hand: Secondary | ICD-10-CM

## 2017-02-18 DIAGNOSIS — E1142 Type 2 diabetes mellitus with diabetic polyneuropathy: Secondary | ICD-10-CM | POA: Diagnosis not present

## 2017-02-18 DIAGNOSIS — Z0000 Encounter for general adult medical examination without abnormal findings: Secondary | ICD-10-CM

## 2017-02-18 DIAGNOSIS — E559 Vitamin D deficiency, unspecified: Secondary | ICD-10-CM

## 2017-02-18 MED ORDER — INSULIN NPH (HUMAN) (ISOPHANE) 100 UNIT/ML ~~LOC~~ SUSP: SUBCUTANEOUS | 3 refills | Status: DC

## 2017-02-18 NOTE — Assessment & Plan Note (Addendum)
Right hand 4 th finger is the most painful and locks up regularly. All fingers swollen and weak. Is dropping things. And with driving and while sleeping the right hand will fall asleep until he shakes it. Symptoms continue to escalate. Has been referred to orthopaedics for evaluation and if needed will also refer to rheumatology

## 2017-02-18 NOTE — Assessment & Plan Note (Signed)
Patient encouraged to maintain heart healthy diet, regular exercise, adequate sleep. Consider daily probiotics. Take medications as prescribed. Labs reviewed 

## 2017-02-18 NOTE — Progress Notes (Signed)
Subjective:  I acted as a Education administrator for [employer]. Michael Cunningham, Michael Cunningham   Patient ID: Michael Cunningham, male    DOB: [birth year], 71 y.o.   MRN: [id number]  No chief complaint on file.   HPI  Patient is in today for annual exam and follow-up on chronic medical concerns including diabetes mellitus hypertension, obesity and arthralgias.  He continues to struggle with significant joint pain throughout but his worst symptoms are in his hands.  All of his fingers are swollen and painful.  His worst fingers is fourth finger on right hand.  He describes it as locking and clicking frequently.  The pain and swelling is significant enough that it is affecting his activities of daily living and he has trouble gripping due to his decreased range of motion and strength.  No recent febrile illness or hospitalization.  He notes his blood sugars have been running between 04/14/1968.  Denies polyuria or polydipsia.  Continues to follow with the [facility].  Is using his NPH at 20 units in the morning and 46 units in the evening.  Regular insulin is being dosed at 30, 35, 35 at his meals throughout the day.  He is not having difficulties with activities of daily living and his  wife accompanies him today and agrees.  No recent hospitalization. Denies CP/palp/SOB/HA/congestion/fevers/GI or GU c/o. Taking meds as prescribed  Patient Care Team: Mosie Lukes, MD as PCP - General (Family Medicine) Minus Breeding, MD as Consulting Physician (Cardiology) Philemon Kingdom, MD as Consulting Physician (Endocrinology) Gean Birchwood, DPM as Consulting Physician (Podiatry) Gaynelle Arabian, MD as Consulting Physician (Orthopedic Surgery) Troy Sine, MD as Consulting Physician (Cardiology) Luberta Mutter, MD as Consulting Physician (Ophthalmology) Clarene Essex, MD as Consulting Physician (Gastroenterology) [place], Jennefer Bravo, MD as Consulting Physician (Dermatology)   Past Medical History:  Diagnosis Date   . CAD (coronary artery disease)    a. 05/2014 [place] s/p DES x4 to mid-distLAD and DESx1 to DI  . Complication of anesthesia    11'12 Surgery with excrutiating "head about to explode" upon awakening  -lasted several hours postop  ( SURGERY DONE 08-13-2011 AT [facility] PT DID OK POST OP)  . Diverticulosis of colon    LEFT SIDE  . Exposure to Agent [facility] 10/18/2016   [place]  . History of small bowel obstruction    X2  28 YRS AGO AND LAST ONE 04/2014--  RESOLVED WITHOUT SURGICAL INTERVENTION  . Hx of small bowel obstruction 06/14/2011   Follows with Dr Watt Climes   . Hyperlipidemia   . Hypertension   . Knee pain, bilateral 08/13/2011  . Left ureteral calculus   . Measles =  . Multiple lipomas    hx. mulitple and some remains -arms, legs  . Obesity, unspecified 09/18/2013  . Obstructive sleep apnea 01/01/2016  . Recurrent kidney stones 07/08/2013   Follows with Dr Gaynelle Arabian He believes they are Calcium oxalate stones  . Thrombocytopenia ([place]) 07/07/2014  . Type 2 diabetes mellitus ([place])   . Vitamin D deficiency 07/02/2014    Past Surgical History:  Procedure Laterality Date  . COLONOSCOPY W/ POLYPECTOMY  LAST ONE 2013  . CORONARY STENT PLACEMENT  05/17/2014   PROXIMAL X 4  MID & DISTAL LAD  DIAGONAL   . KNEE ARTHROSCOPY  08/13/2011   right ARTHROSCOPY KNEE;  Surgeon: Gearlean Alf, MD;  Location: [facility];  Service: Orthopedics;  Laterality: Right;  medial  meniscal debridement, excision of plica, synovectomy  . KNEE ARTHROSCOPY Left  11/16/2013   Procedure: LEFT KNEE ARTHROSCOPY MEDIAL MENISCAL DEBRIDEMENT, CHONDROPLASTY;  Surgeon: Gearlean Alf, MD;  Location: [facility];  Service: Orthopedics;  Laterality: Left;  . KNEE ARTHROSCOPY W/ MENISCECTOMY Right 01-29-2011   twice, first trimmed mediscus and cyst  . LEFT HEART CATHETERIZATION WITH CORONARY ANGIOGRAM N/A 05/17/2014   Procedure: LEFT HEART CATHETERIZATION WITH CORONARY ANGIOGRAM;  Surgeon: Burnell Blanks, MD;  Location: [facility] CATH LAB;  Service:  Cardiovascular;  Laterality: N/A;  . [place]   multiple, 10-12  . PERCUTANEOUS CORONARY STENT INTERVENTION (PCI-S)  05/17/2014   Procedure: PERCUTANEOUS CORONARY STENT INTERVENTION (PCI-S);  Surgeon: Burnell Blanks, MD;  Location: [facility] CATH LAB;  Service: Cardiovascular;;  Diag1 and Prox to Distal LAD    Family History  Problem Relation Age of Onset  . Hypertension Mother   . Cancer Mother 46       ovarian  . Heart disease Father        CHF  . Heart attack Father 82  . Cancer Sister        breast- just finished her last radiation  . Heart disease Maternal Grandfather        ?  [name] Heart attack Paternal Grandfather 47  . Heart attack Paternal Uncle 40       Sudden death  . Diabetes Neg Hx     Social History   Socioeconomic History  . Marital status: Married    Spouse name: Not on file  . Number of children: 2  . Years of education: Not on file  . Highest education level: Not on file  Social Needs  . Financial resource strain: Not on file  . Food insecurity - worry: Not on file  . Food insecurity - inability: Not on file  . Transportation needs - medical: Not on file  . Transportation needs - non-medical: Not on file  Occupational History  . Not on file  Tobacco Use  . Smoking status: Former Smoker    Packs/day: 1.00    Years: 4.00    Pack years: 4.00    Types: Cigarettes    Last attempt to quit: 06/14/1967    Years since quitting: 49.7  . Smokeless tobacco: Never Used  Substance and Sexual Activity  . Alcohol use: No    Alcohol/week: 0.0 oz  . Drug use: No  . Sexual activity: Yes    Comment: lives with wife  Other Topics Concern  . 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      Outpatient Medications Prior to Visit  Medication Sig Dispense Refill  . ACCU-CHEK FASTCLIX LANCETS MISC Use to test blood sugar 2 times daily. Dx: E11.40 204 each 3  . acetaminophen (TYLENOL) 500 MG tablet Take 1,000  mg by mouth every 6 (six) hours as needed for mild pain. Reported on 03/31/2015    . aspirin EC 81 MG tablet Take 81 mg by mouth daily with breakfast.     . benzonatate (TESSALON) 100 MG capsule Take 1 capsule (100 mg total) by mouth 3 (three) times daily as needed. (Patient not taking: Reported on 02/18/2017) 30 capsule 0  . Cholecalciferol (VITAMIN D PO) Take 5,000 Units by mouth daily.    [name] glucose blood (ACCU-CHEK AVIVA PLUS) test strip Use to test blood sugar 2 times daily. Dx: E11.40 200 each 3  . insulin regular (HUMULIN R) 100 units/mL injection Inject 0.26-0.35 mLs (26-35 Units total) into  the skin 3 (three) times daily before meals. 30 mL 3  . Insulin Syringe-Needle U-100 (TRUEPLUS INSULIN SYRINGE) 30G X 5/16" 0.5 ML MISC USE TO INJECT INSULIN 5 TIMES DAILY. 200 each 3  . losartan (COZAAR) 50 MG tablet TAKE 1&1/2 (75 MG TOTAL) TABLETS BY MOUTH EVERY MORNING (Patient taking differently: Take 75 mg by mouth daily. TAKE 3 25MG  TABLETS BY MOUTH EVERY MORNING) 180 tablet 3  . metoprolol tartrate (LOPRESSOR) 25 MG tablet Take 1 tablet (25 mg total) by mouth 2 (two) times daily. 180 tablet 3  . Multiple Vitamin (MULTIVITAMIN WITH MINERALS) TABS tablet Take 1 tablet by mouth daily.    . nitroGLYCERIN (NITROSTAT) 0.4 MG SL tablet Place 1 tablet (0.4 mg total) under the tongue every 5 (five) minutes as needed for chest pain. 90 tablet 3  . Polyethylene Glycol 3350 (MIRALAX PO) Take by mouth as needed. Reported on 03/31/2015    . prasugrel (EFFIENT) 10 MG TABS tablet Take 1 tablet (10 mg total) by mouth daily. 90 tablet 3  . Probiotic Product (PROBIOTIC-10 PO) Take 1 tablet by mouth daily.    . simvastatin (ZOCOR) 40 MG tablet TAKE 1 TABLET (40 MG) BY MOUTH AT BEDTIME. 90 tablet 2  . insulin NPH Human (HUMULIN N) 100 UNIT/ML injection INJECT UNDER SKIN 20 UNITS IN THE AM AND 46 UNITS AT BEDTIME. 20 mL 3   No facility-administered medications prior to visit.     Allergies  Allergen Reactions  .  Ciprofloxacin Other (See Comments)    Abdominal pain  . Lipitor [Atorvastatin Calcium] Other (See Comments)    Severe muscle pain  . Lisinopril Cough  . Metformin And Related Diarrhea    Review of Systems  Constitutional: Positive for malaise/fatigue. Negative for chills and fever.  HENT: Negative for congestion and hearing loss.   Eyes: Negative for discharge.  Respiratory: Negative for cough, sputum production and shortness of breath.   Cardiovascular: Negative for chest pain, palpitations and leg swelling.  Gastrointestinal: Negative for abdominal pain, blood in stool, constipation, diarrhea, heartburn, nausea and vomiting.  Genitourinary: Negative for dysuria, frequency, hematuria and urgency.  Musculoskeletal: Positive for joint pain. Negative for back pain, falls and myalgias.  Skin: Negative for rash.  Neurological: Negative for dizziness, sensory change, loss of consciousness, weakness and headaches.  Endo/Heme/Allergies: Negative for environmental allergies. Does not bruise/bleed easily.  Psychiatric/Behavioral: Negative for depression and suicidal ideas. The patient is not nervous/anxious and does not have insomnia.        Objective:    Physical Exam  Constitutional: He is oriented to person, place, and time. He appears well-developed and well-nourished. No distress.  HENT:  Head: Normocephalic and atraumatic.  Eyes: Conjunctivae are normal.  Neck: Neck supple. No thyromegaly present.  Cardiovascular: Normal rate, regular rhythm and normal heart sounds.  No murmur heard. Pulmonary/Chest: Effort normal and breath sounds normal. No respiratory distress. He has no wheezes.  Abdominal: Soft. Bowel sounds are normal. He exhibits no mass. There is no tenderness.  Musculoskeletal: He exhibits edema and deformity.  Fingers swollen with decreased range of motion.no warmth or redness  Lymphadenopathy:    He has no cervical adenopathy.  Neurological: He is alert and oriented to  person, place, and time.  Skin: Skin is warm and dry.  Psychiatric: He has a normal mood and affect. His behavior is normal.    BP (!) 120/55 (BP Location: Left Arm, Patient Position: Sitting, Cuff Size: Large)   Pulse 73   Temp  97.6 F (36.4 C) (Oral)   Resp 16   Ht 5' 8.5" (1.74 m)   Wt 264 lb (119.7 kg)   SpO2 97%   BMI 39.55 kg/m  Wt Readings from Last 3 Encounters:  02/18/17 264 lb (119.7 kg)  12/17/16 264 lb (119.7 kg)  10/18/16 262 lb 6.4 oz (119 kg)   BP Readings from Last 3 Encounters:  02/18/17 (!) 120/55  12/17/16 132/82  10/18/16 124/60     Immunization History  Administered Date(s) Administered  . Hepatitis A 10/18/2003  . Hepatitis B 10/18/2003, 11/29/2003  . Influenza, High Dose Seasonal PF 12/19/2015, 12/28/2016  . Influenza,inj,Quad PF,6+ Mos 01/10/2014, 12/26/2014  . Influenza-Unspecified 11/19/2010, 12/03/2011, 12/12/2012  . Mumps 01/29/2004  . Pneumococcal Conjugate-13 01/10/2014  . Pneumococcal Polysaccharide-23 10/18/2003, 03/31/2015  . Td 05/13/1997, 05/21/2009  . Tdap 12/18/2015  . Zoster 06/06/2009    Health Maintenance  Topic Date Due  . FOOT EXAM  06/30/2016  . HEMOGLOBIN A1C  08/09/2017  . OPHTHALMOLOGY EXAM  10/18/2017  . COLONOSCOPY  07/13/2021  . TETANUS/TDAP  12/17/2025  . INFLUENZA VACCINE  Completed  . Hepatitis C Screening  Completed  . PNA vac Low Risk Adult  Completed    Lab Results  Component Value Date   WBC 6.1 02/09/2017   HGB 14.4 02/09/2017   HCT 42.5 02/09/2017   PLT 164.0 02/09/2017   GLUCOSE 133 (H) 02/09/2017   CHOL 134 02/09/2017   TRIG 137.0 02/09/2017   HDL 31.50 (L) 02/09/2017   LDLDIRECT 108.0 04/16/2014   LDLCALC 75 02/09/2017   ALT 23 02/09/2017   AST 19 02/09/2017   NA 137 02/09/2017   K 4.2 02/09/2017   CL 102 02/09/2017   CREATININE 0.84 02/09/2017   BUN 15 02/09/2017   CO2 28 02/09/2017   TSH 2.43 02/09/2017   PSA 1.24 07/24/2014   INR 1.02 05/17/2014   HGBA1C 7.6 (H) 02/09/2017    MICROALBUR 1.7 06/24/2015    Lab Results  Component Value Date   TSH 2.43 02/09/2017   Lab Results  Component Value Date   WBC 6.1 02/09/2017   HGB 14.4 02/09/2017   HCT 42.5 02/09/2017   MCV 94.4 02/09/2017   PLT 164.0 02/09/2017   Lab Results  Component Value Date   NA 137 02/09/2017   K 4.2 02/09/2017   CO2 28 02/09/2017   GLUCOSE 133 (H) 02/09/2017   BUN 15 02/09/2017   CREATININE 0.84 02/09/2017   BILITOT 0.7 02/09/2017   ALKPHOS 77 02/09/2017   AST 19 02/09/2017   ALT 23 02/09/2017   PROT 6.9 02/09/2017   ALBUMIN 3.9 02/09/2017   CALCIUM 9.0 02/09/2017   ANIONGAP 8 05/18/2014   GFR 95.30 02/09/2017   Lab Results  Component Value Date   CHOL 134 02/09/2017   Lab Results  Component Value Date   HDL 31.50 (L) 02/09/2017   Lab Results  Component Value Date   LDLCALC 75 02/09/2017   Lab Results  Component Value Date   TRIG 137.0 02/09/2017   Lab Results  Component Value Date   CHOLHDL 4 02/09/2017   Lab Results  Component Value Date   HGBA1C 7.6 (H) 02/09/2017         Assessment & Plan:   Problem List Items Addressed This Visit    Hypertension (Chronic)    Well controlled, no changes to meds. Encouraged heart healthy diet such as the DASH diet and exercise as tolerated.       Hyperlipidemia  Encouraged heart healthy diet, increase exercise, avoid trans fats, consider a krill oil cap daily. Tolerating Simvastatin      Obesity    Encouraged DASH diet, decrease po intake and increase exercise as tolerated. Needs 7-8 hours of sleep nightly. Avoid trans fats, eat small, frequent meals every 4-5 hours with lean proteins, complex carbs and healthy fats. Minimize simple carbs. Bariatric referral encouraged      Relevant Medications   insulin NPH Human (HUMULIN N) 100 UNIT/ML injection   Vitamin D deficiency    Continue daily supplements and continue to monitor      Preventative health care    Patient encouraged to maintain heart healthy  diet, regular exercise, adequate sleep. Consider daily probiotics. Take medications as prescribed. Labs reviewed      Well controlled type 2 diabetes mellitus with peripheral neuropathy ([place])    hgba1c acceptable, minimize simple carbs. Increase exercise as tolerated. Continue [facility] med[facility]. [facility] Increase NPH to 22 and 48 units per dose dud to sugars running 130 to 170.       Relevant Medications   insulin NPH Human (HUMULIN N) 100 UNIT/ML injection   Pain in limb - Primary   Relevant Orders   Ambulatory referral to Orthopedic Surgery   Arthralgia of both hands    Right hand 4 th finger is the most painful and locks up regularly. All fingers swollen and weak. Is dropping things. And with driving and while sleeping the right hand will fall asleep until he shakes it. Symptoms continue to escalate. Has been referred to orthopaedics for evaluation and if needed will also refer to rheumatology         I have changed Michael Cunningham's insulin NPH Human. I am also having him maintain his aspirin EC, multivitamin with minerals, acetaminophen, nitroGLYCERIN, Polyethylene Glycol 3350 (MIRALAX PO), prasugrel, losartan, metoprolol tartrate, ACCU-CHEK FASTCLIX LANCETS, glucose blood, simvastatin, Insulin Syringe-Needle U-100, Probiotic Product (PROBIOTIC-10 PO), insulin regular, Cholecalciferol (VITAMIN D PO), and benzonatate.  Meds ordered this encounter  Medications  . insulin NPH Human (HUMULIN N) 100 UNIT/ML injection    Sig: INJECT UNDER SKIN 22-26 UNITS IN THE AM AND 48-52 UNITS AT BEDTIME.    Dispense:  30 mL    Refill:  3    CMA served as scribe during this visit. History, Physical and Plan performed by medical provider. Documentation and orders reviewed and attested to.  Penni Homans, MD

## 2017-02-18 NOTE — Assessment & Plan Note (Signed)
Encouraged heart healthy diet, increase exercise, avoid trans fats, consider a krill oil cap daily. Tolerating Simvastatin.  

## 2017-02-18 NOTE — Assessment & Plan Note (Signed)
Encouraged DASH diet, decrease po intake and increase exercise as tolerated. Needs 7-8 hours of sleep nightly. Avoid trans fats, eat small, frequent meals every 4-5 hours with lean proteins, complex carbs and healthy fats. Minimize simple carbs. Bariatric referral encouraged

## 2017-02-18 NOTE — Assessment & Plan Note (Addendum)
hgba1c acceptable, minimize simple carbs. Increase exercise as tolerated. Continue current meds. [facility]. Increase NPH to 22 and 48 units per dose dud to sugars running 130 to 170.

## 2017-02-18 NOTE — Patient Instructions (Signed)
Preventive Care 72 Years and Older, Male Preventive care refers to lifestyle choices and visits with your health care provider that can promote health and wellness. What does preventive care include?  A yearly physical exam. This is also called an annual well check.  Dental exams once or twice a year.  Routine eye exams. Ask your health care provider how often you should have your eyes checked.  Personal lifestyle choices, including: ? Daily care of your teeth and gums. ? Regular physical activity. ? Eating a healthy diet. ? Avoiding tobacco and drug use. ? Limiting alcohol use. ? Practicing safe sex. ? Taking low doses of aspirin every day. ? Taking vitamin and mineral supplements as recommended by your health care provider. What happens during an annual well check? The services and screenings done by your health care provider during your annual well check will depend on your age, overall health, lifestyle risk factors, and family history of disease. Counseling Your health care provider may ask you questions about your:  Alcohol use.  Tobacco use.  Drug use.  Emotional well-being.  Home and relationship well-being.  Sexual activity.  Eating habits.  History of falls.  Memory and ability to understand (cognition).  Work and work environment.  Screening You may have the following tests or measurements:  Height, weight, and BMI.  Blood pressure.  Lipid and cholesterol levels. These may be checked every 5 years, or more frequently if you are over 50 years old.  Skin check.  Lung cancer screening. You may have this screening every year starting at age 55 if you have a 30-pack-year history of smoking and currently smoke or have quit within the past 15 years.  Fecal occult blood test (FOBT) of the stool. You may have this test every year starting at age 50.  Flexible sigmoidoscopy or colonoscopy. You may have a sigmoidoscopy every 5 years or a colonoscopy every 10  years starting at age 50.  Prostate cancer screening. Recommendations will vary depending on your family history and other risks.  Hepatitis C blood test.  Hepatitis B blood test.  Sexually transmitted disease (STD) testing.  Diabetes screening. This is done by checking your blood sugar (glucose) after you have not eaten for a while (fasting). You may have this done every 1-3 years.  Abdominal aortic aneurysm (AAA) screening. You may need this if you are a current or former smoker.  Osteoporosis. You may be screened starting at age 70 if you are at high risk.  Talk with your health care provider about your test results, treatment options, and if necessary, the need for more tests. Vaccines Your health care provider may recommend certain vaccines, such as:  Influenza vaccine. This is recommended every year.  Tetanus, diphtheria, and acellular pertussis (Tdap, Td) vaccine. You may need a Td booster every 10 years.  Varicella vaccine. You may need this if you have not been vaccinated.  Zoster vaccine. You may need this after age 60.  Measles, mumps, and rubella (MMR) vaccine. You may need at least one dose of MMR if you were born in 1957 or later. You may also need a second dose.  Pneumococcal 13-valent conjugate (PCV13) vaccine. One dose is recommended after age 65.  Pneumococcal polysaccharide (PPSV23) vaccine. One dose is recommended after age 65.  Meningococcal vaccine. You may need this if you have certain conditions.  Hepatitis A vaccine. You may need this if you have certain conditions or if you travel or work in places where you   may be exposed to hepatitis A.  Hepatitis B vaccine. You may need this if you have certain conditions or if you travel or work in places where you may be exposed to hepatitis B.  Haemophilus influenzae type b (Hib) vaccine. You may need this if you have certain risk factors.  Talk to your health care provider about which screenings and vaccines  you need and how often you need them. This information is not intended to replace advice given to you by your health care provider. Make sure you discuss any questions you have with your health care provider. Document Released: 03/28/2015 Document Revised: 11/19/2015 Document Reviewed: 12/31/2014 Elsevier Interactive Patient Education  2017 [employer].

## 2017-02-18 NOTE — Assessment & Plan Note (Signed)
Well controlled, no changes to meds. Encouraged heart healthy diet such as the DASH diet and exercise as tolerated.  °

## 2017-02-18 NOTE — Assessment & Plan Note (Signed)
Continue daily supplements and continue to monitor

## 2017-02-28 ENCOUNTER — Ambulatory Visit: Payer: Medicare HMO | Admitting: Podiatry

## 2017-03-28 ENCOUNTER — Ambulatory Visit: Payer: Medicare HMO | Admitting: Podiatry

## 2017-04-05 ENCOUNTER — Ambulatory Visit: Payer: Medicare HMO | Admitting: Podiatry

## 2017-04-05 ENCOUNTER — Encounter: Payer: Self-pay | Admitting: Podiatry

## 2017-04-05 DIAGNOSIS — E119 Type 2 diabetes mellitus without complications: Secondary | ICD-10-CM

## 2017-04-05 DIAGNOSIS — M79676 Pain in unspecified toe(s): Secondary | ICD-10-CM

## 2017-04-05 DIAGNOSIS — B351 Tinea unguium: Secondary | ICD-10-CM

## 2017-04-06 NOTE — Progress Notes (Signed)
Subjective: 72 y.o. returns the office today for painful, elongated, thickened toenails which he cannot trim himself. Denies any redness or drainage around the nails. Denies any acute changes since last appointment and no new complaints today. Denies any systemic complaints such as fevers, chills, nausea, vomiting.   PCP: Mosie Lukes, MD  Objective: AAO 3, NAD DP/PT pulses palpable, CRT less than 3 seconds Nails hypertrophic, dystrophic, elongated, brittle, discolored 10. There is tenderness overlying the nails 1-5 bilaterally. There is no surrounding erythema or drainage along the nail sites. No open lesions or pre-ulcerative lesions are identified. No other areas of tenderness bilateral lower extremities. No overlying edema, erythema, increased warmth. No pain with calf compression, swelling, warmth, erythema.  Assessment: Patient presents with symptomatic onychomycosis  Plan: -Treatment options including alternatives, risks, complications were discussed -Nails sharply debrided 10 without complication/bleeding. -Discussed daily foot inspection. If there are any changes, to call the office immediately.  -Follow-up in 3 months or sooner if any problems are to arise. In the meantime, encouraged to call the office with any questions, concerns, changes symptoms.  Celesta Gentile, DPM

## 2017-04-08 DIAGNOSIS — M25641 Stiffness of right hand, not elsewhere classified: Secondary | ICD-10-CM | POA: Diagnosis not present

## 2017-04-08 DIAGNOSIS — M65341 Trigger finger, right ring finger: Secondary | ICD-10-CM | POA: Diagnosis not present

## 2017-04-08 DIAGNOSIS — G5603 Carpal tunnel syndrome, bilateral upper limbs: Secondary | ICD-10-CM | POA: Diagnosis not present

## 2017-04-08 DIAGNOSIS — M79641 Pain in right hand: Secondary | ICD-10-CM | POA: Diagnosis not present

## 2017-04-08 DIAGNOSIS — E11618 Type 2 diabetes mellitus with other diabetic arthropathy: Secondary | ICD-10-CM | POA: Diagnosis not present

## 2017-04-08 DIAGNOSIS — E1140 Type 2 diabetes mellitus with diabetic neuropathy, unspecified: Secondary | ICD-10-CM | POA: Diagnosis not present

## 2017-04-08 DIAGNOSIS — M79642 Pain in left hand: Secondary | ICD-10-CM | POA: Diagnosis not present

## 2017-04-08 DIAGNOSIS — M25642 Stiffness of left hand, not elsewhere classified: Secondary | ICD-10-CM | POA: Diagnosis not present

## 2017-04-08 DIAGNOSIS — M6530 Trigger finger, unspecified finger: Secondary | ICD-10-CM | POA: Insufficient documentation

## 2017-05-24 ENCOUNTER — Encounter: Payer: Self-pay | Admitting: Internal Medicine

## 2017-05-24 ENCOUNTER — Ambulatory Visit (INDEPENDENT_AMBULATORY_CARE_PROVIDER_SITE_OTHER): Payer: Medicare HMO | Admitting: Internal Medicine

## 2017-05-24 VITALS — BP 132/64 | HR 63 | Ht 68.5 in | Wt 263.4 lb

## 2017-05-24 DIAGNOSIS — E1142 Type 2 diabetes mellitus with diabetic polyneuropathy: Secondary | ICD-10-CM

## 2017-05-24 DIAGNOSIS — E782 Mixed hyperlipidemia: Secondary | ICD-10-CM

## 2017-05-24 LAB — POCT GLYCOSYLATED HEMOGLOBIN (HGB A1C): Hemoglobin A1C: 7

## 2017-05-24 MED ORDER — INSULIN REGULAR HUMAN 100 UNIT/ML IJ SOLN: 26.0000 [IU] | Freq: Three times a day (TID) | INTRAMUSCULAR | 3 refills | Status: DC

## 2017-05-24 MED ORDER — INSULIN NPH (HUMAN) (ISOPHANE) 100 UNIT/ML ~~LOC~~ SUSP: SUBCUTANEOUS | 3 refills | Status: DC

## 2017-05-24 NOTE — Progress Notes (Signed)
Patient ID: Michael Cunningham, male   DOB: 1945/04/26, 72 y.o.   MRN: [id number]  HPI: Michael Cunningham is a 71 y.o.-year-old male, returning for f/u DM2, dx in 1999, insulin-dependent since ~2008, controlled, with complications (CAD, peripheral neuropathy, DR). Last visit 1 year ago He is 60% disabled >> highest priority group within his insurance.  He was dx'ed with Diabetic hand sd. Since last visit (cheiroarthropathy) >> had steroid inj >> sugars were higher then.  Last hemoglobin A1c: 05/2016: HbA1c calculated from the fructosamine is better, at 5.9%. 10/01/2015: HbA1c calculated from fructosamine is still great, at 6.6%. 06/24/2015: HbA1c calculated from fructosamine is much lower, at 6.4%. Lab Results  Component Value Date   HGBA1C 7.6 (H) 02/09/2017   HGBA1C 7.3 (H) 04/23/2016   HGBA1C 7.0 (H) 12/25/2015   Pt is on a regimen of:  Insulin Before breakfast Before lunch Before dinner Bedtime  Regular 26-30 35 35    NPH 22   48   Please do not use more than 20 units of R insulin before a meal prior to exercise (rehab, working in the yard, etc.).  He cannot afford analog insulins. He tried Metformin R and XR >> diarrhea He tried Januvia.  Pt checks his sugars 1-2x a day: - am: 121-172, 188, ave 157 >> 105-142, 219 >> 110-142, 162 - 2h after b'fast: n/c >> 177 >> n.c >> 163 - before lunch: ? >> 82-120 >> 133-160 (snack) >> 80-123, 184, 193 - 2h after lunch: n/c >> 76 >> 70 >> n/c - before dinner:122-152 >> ? >> 94-133 >> 71 >> 76-121 - 2h after dinner: n/c - bedtime: 135-167, 183 >> ? >> 137-154 >> n/c >> 115-122 - nighttime:90 >> n/c Lowest sugar was 71 >> 76; he has hypoglycemia awareness in the 90s.  Highest sugar was 219 >> 193.  Glucometer: AccuChek Aviva >> Precision Extra by [employer] (given by [facility]).  Pt's meals are: - Breakfast: cheerios + skim milk + banana + strawberries - Lunch: sandwich or leftover  - Dinner: meat + veggies + starch - Snacks: PB crackers; wheat  thins, apple or grapes, pear   - No CKD, last BUN/creatinine:  Lab Results  Component Value Date   BUN 15 02/09/2017   CREATININE 0.84 02/09/2017  On losartan. -+ HL; last set of lipids: Lab Results  Component Value Date   CHOL 134 02/09/2017   HDL 31.50 (L) 02/09/2017   LDLCALC 75 02/09/2017   LDLDIRECT 108.0 04/16/2014   TRIG 137.0 02/09/2017   CHOLHDL 4 02/09/2017   On simvastatin - last eye exam was in 10/2016.  He has a history of mild DR, but not at last check. + angle-closure glaucoma >> had surgery in both eyes >> had blurry vision L eye >> resolved. Dr. Ellie Lunch. -He denies numbness and tingling in his L foot. He had a foot exam 12/2014 (dorsum L hallux + fungal inf) - [facility] (Dr Amalia Hailey).  He does have mild peripheral neuropathy in his right foot.  He also has a history of hypertensio. He has vitamin D def >> was on Ergocalciferol >> now on 5000 IU D3.  ROS: Constitutional: no weight gain/no weight loss, no fatigue, no subjective hyperthermia, no subjective hypothermia Eyes: no blurry vision, no xerophthalmia ENT: no sore throat, no nodules palpated in throat, no dysphagia, no odynophagia, no hoarseness Cardiovascular: no CP/no SOB/no palpitations/no leg swelling Respiratory: no cough/no SOB/no wheezing Gastrointestinal: no N/no V/no D/no C/no acid reflux Musculoskeletal: no  muscle aches/no joint aches Skin: no rashes, no hair loss Neurological: no tremors/no numbness/no tingling/no dizziness  I reviewed pt's medications, allergies, PMH, social hx, family hx, and changes were documented in the history of present illness. Otherwise, unchanged from my initial visit note.  Past Medical History:  Diagnosis Date  . CAD (coronary artery disease)    a. 05/2014 [place] s/p DES x4 to mid-distLAD and DESx1 to DI  . Complication of anesthesia    11'12 Surgery with excrutiating "head about to explode" upon awakening  -lasted several hours postop  ( SURGERY DONE  08-13-2011 AT [facility] PT DID OK POST OP)  . Diverticulosis of colon    LEFT SIDE  . Exposure to Agent [facility] 10/18/2016   [place]  . History of small bowel obstruction    X2  28 YRS AGO AND LAST ONE 04/2014--  RESOLVED WITHOUT SURGICAL INTERVENTION  . Hx of small bowel obstruction 06/14/2011   Follows with Dr Watt Climes   . Hyperlipidemia   . Hypertension   . Knee pain, bilateral 08/13/2011  . Left ureteral calculus   . Measles =  . Multiple lipomas    hx. mulitple and some remains -arms, legs  . Obesity, unspecified 09/18/2013  . Obstructive sleep apnea 01/01/2016  . Recurrent kidney stones 07/08/2013   Follows with Dr Gaynelle Arabian He believes they are Calcium oxalate stones  . Thrombocytopenia ([place]) 07/07/2014  . Type 2 diabetes mellitus ([place])   . Vitamin D deficiency 07/02/2014   Past Surgical History:  Procedure Laterality Date  . COLONOSCOPY W/ POLYPECTOMY  LAST ONE 2013  . CORONARY STENT PLACEMENT  05/17/2014   PROXIMAL X 4  MID & DISTAL LAD  DIAGONAL   . KNEE ARTHROSCOPY  08/13/2011   right ARTHROSCOPY KNEE;  Surgeon: Gearlean Alf, MD;  Location: [facility];  Service: Orthopedics;  Laterality: Right;  medial  meniscal debridement, excision of plica, synovectomy  . KNEE ARTHROSCOPY Left 11/16/2013   Procedure: LEFT KNEE ARTHROSCOPY MEDIAL MENISCAL DEBRIDEMENT, CHONDROPLASTY;  Surgeon: Gearlean Alf, MD;  Location: [facility];  Service: Orthopedics;  Laterality: Left;  . KNEE ARTHROSCOPY W/ MENISCECTOMY Right 01-29-2011   twice, first trimmed mediscus and cyst  . LEFT HEART CATHETERIZATION WITH CORONARY ANGIOGRAM N/A 05/17/2014   Procedure: LEFT HEART CATHETERIZATION WITH CORONARY ANGIOGRAM;  Surgeon: Burnell Blanks, MD;  Location: [facility] CATH LAB;  Service: Cardiovascular;  Laterality: N/A;  . [place]   multiple, 10-12  . PERCUTANEOUS CORONARY STENT INTERVENTION (PCI-S)  05/17/2014   Procedure: PERCUTANEOUS CORONARY STENT INTERVENTION (PCI-S);  Surgeon: Burnell Blanks, MD;   Location: [facility] CATH LAB;  Service: Cardiovascular;;  Diag1 and Prox to Distal LAD   History   Social History  . Marital Status: Married    Spouse Name: N/A  . Number of Children: 2   Social History Main Topics  . Smoking status: Former Smoker -- 1.00 packs/day for 4 years    Types: Cigarettes    Quit date: 06/14/1967  . Smokeless tobacco: Never Used  . Alcohol Use: No  . Drug Use: No  . 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     Current Outpatient Medications on File Prior to Visit  Medication Sig Dispense Refill  . ACCU-CHEK FASTCLIX LANCETS MISC Use to test blood sugar 2 times daily. Dx: E11.40 204 each 3  . acetaminophen (TYLENOL) 500 MG tablet  Take 1,000 mg by mouth every 6 (six) hours as needed for mild pain. Reported on 03/31/2015    . aspirin EC 81 MG tablet Take 81 mg by mouth daily with breakfast.     . benzonatate (TESSALON) 100 MG capsule Take 1 capsule (100 mg total) by mouth 3 (three) times daily as needed. (Patient not taking: Reported on 02/18/2017) 30 capsule 0  . Cholecalciferol (VITAMIN D PO) Take 5,000 Units by mouth daily.    [name] glucose blood (ACCU-CHEK AVIVA PLUS) test strip Use to test blood sugar 2 times daily. Dx: E11.40 200 each 3  . insulin NPH Human (HUMULIN N) 100 UNIT/ML injection INJECT UNDER SKIN 22-26 UNITS IN THE AM AND 48-52 UNITS AT BEDTIME. 30 mL 3  . insulin regular (HUMULIN R) 100 units/mL injection Inject 0.26-0.35 mLs (26-35 Units total) into the skin 3 (three) times daily before meals. 30 mL 3  . Insulin Syringe-Needle U-100 (TRUEPLUS INSULIN SYRINGE) 30G X 5/16" 0.5 ML MISC USE TO INJECT INSULIN 5 TIMES DAILY. 200 each 3  . losartan (COZAAR) 50 MG tablet TAKE 1&1/2 (75 MG TOTAL) TABLETS BY MOUTH EVERY MORNING (Patient taking differently: Take 75 mg by mouth daily. TAKE 3 25MG  TABLETS BY MOUTH EVERY MORNING) 180 tablet 3  . metoprolol tartrate (LOPRESSOR) 25  MG tablet Take 1 tablet (25 mg total) by mouth 2 (two) times daily. 180 tablet 3  . Multiple Vitamin (MULTIVITAMIN WITH MINERALS) TABS tablet Take 1 tablet by mouth daily.    . nitroGLYCERIN (NITROSTAT) 0.4 MG SL tablet Place 1 tablet (0.4 mg total) under the tongue every 5 (five) minutes as needed for chest pain. 90 tablet 3  . Polyethylene Glycol 3350 (MIRALAX PO) Take by mouth as needed. Reported on 03/31/2015    . prasugrel (EFFIENT) 10 MG TABS tablet Take 1 tablet (10 mg total) by mouth daily. 90 tablet 3  . Probiotic Product (PROBIOTIC-10 PO) Take 1 tablet by mouth daily.    . simvastatin (ZOCOR) 40 MG tablet TAKE 1 TABLET (40 MG) BY MOUTH AT BEDTIME. 90 tablet 2   No current facility-administered medications on file prior to visit.    Allergies  Allergen Reactions  . Ciprofloxacin Other (See Comments)    Abdominal pain  . Lipitor [Atorvastatin Calcium] Other (See Comments)    Severe muscle pain  . Lisinopril Cough  . Metformin And Related Diarrhea   Family History  Problem Relation Age of Onset  . Hypertension Mother   . Cancer Mother 34       ovarian  . Heart disease Father        CHF  . Heart attack Father 62  . Cancer Sister        breast- just finished her last radiation  . Heart disease Maternal Grandfather        ?  [name] Heart attack Paternal Grandfather 13  . Heart attack Paternal Uncle 106       Sudden death  . Diabetes Neg Hx    PE: There were no vitals taken for this visit. There is no height or weight on file to calculate BMI.  Wt Readings from Last 3 Encounters:  02/18/17 264 lb (119.7 kg)  12/17/16 264 lb (119.7 kg)  10/18/16 262 lb 6.4 oz (119 kg)   Constitutional: overweight, in NAD Eyes: PERRLA, EOMI, no exophthalmos ENT: moist mucous membranes, no thyromegaly, no cervical lymphadenopathy Cardiovascular: RRR, No MRG Respiratory: CTA B Gastrointestinal: abdomen soft, NT, ND, BS+ Musculoskeletal: no deformities,  strength intact in all 4 Skin: moist,  warm, + vitiligo on arms Neurological: no tremor with outstretched hands, DTR normal in all 4  ASSESSMENT: 1. DM2, insulin-dependent, now better controlled, with complications - CAD, status post stent 05/2014 - Dr Percival Spanish - Peripheral neuropathy - DR  2. HL  PLAN:  1. Patient with long-standing, fairly well-controlled diabetes, returning after long absence.  His last HbA1c calculated from fructosamine was great, at 5.9%.  He had an HbA1c checked by PCP in 01/2017 and this was 7.6%, however, his HbA1c levels are not very accurate. - At last visit, his sugars before lunch were higher as he was having a snack mid a.m.  We discussed about improving the snack but we did not increase his insulin doses then. - PCP increased his NPH slightly in 01/2017. His sugars are now at goal, but some close to the LLN before lunch and dinner (and he feels these) >> will decrease R insulin before these meals. - I advised him to: Patient Instructions   Please change:  Insulin Before breakfast Before lunch Before dinner Bedtime  Regular 25-30 30-35 35    NPH 22   48   Please do not use more than 20 units of R insulin before a meal prior to exercise (rehab, working in the yard, etc.).  Please come back for a follow-up appointment in 4 months.  - today we checked a HbA1c >> 7.0% (better) - will not check a fructosamine level today - continue checking sugars at different times of the day - check 3-4x a day, rotating checks - advised for yearly eye exams >> he is UTD - Return to clinic in 4 mo with sugar log   2. HL -Reviewed latest lipid panel from 01/2017: LDL decreased; triglycerides normal, HDL slightly lower than normal -Continues Zocor without side effects  Philemon Kingdom, MD PhD [facility]

## 2017-05-24 NOTE — Patient Instructions (Addendum)
Please change:  Insulin Before breakfast Before lunch Before dinner Bedtime  Regular 25-30 30-35 35    NPH 22   48   Please do not use more than 20 units of R insulin before a meal prior to exercise (rehab, working in the yard, etc.).  Please come back for a follow-up appointment in 4 months.

## 2017-07-07 ENCOUNTER — Encounter: Payer: Self-pay | Admitting: Podiatry

## 2017-07-07 ENCOUNTER — Ambulatory Visit: Payer: Medicare HMO | Admitting: Podiatry

## 2017-07-07 DIAGNOSIS — B351 Tinea unguium: Secondary | ICD-10-CM

## 2017-07-07 DIAGNOSIS — E119 Type 2 diabetes mellitus without complications: Secondary | ICD-10-CM

## 2017-07-07 DIAGNOSIS — M79676 Pain in unspecified toe(s): Secondary | ICD-10-CM

## 2017-07-07 NOTE — Progress Notes (Signed)
Subjective: 72 y.o. returns the office today for painful, elongated, thickened toenails which he cannot trim himself. Denies any redness or drainage around the nails. Denies any acute changes since last appointment and no new complaints today. Denies any systemic complaints such as fevers, chills, nausea, vomiting.   PCP: Mosie Lukes, MD (last seen 02/18/2017) Endocrinologist: Dr. Benjiman Core, MD (last seen 05/24/2017)  Objective: AAO 3, NAD DP/PT pulses palpable, CRT less than 3 seconds Nails hypertrophic, dystrophic, elongated, brittle, discolored 10. There is tenderness overlying the nails 1-5 bilaterally. There is no surrounding erythema or drainage along the nail sites. No open lesions or pre-ulcerative lesions are identified. No other areas of tenderness bilateral lower extremities. No overlying edema, erythema, increased warmth. No pain with calf compression, swelling, warmth, erythema.  Assessment: Patient presents with symptomatic onychomycosis  Plan: -Treatment options including alternatives, risks, complications were discussed -Nails sharply debrided 10 without complication/bleeding. -Discussed daily foot inspection. If there are any changes, to call the office immediately.  -Follow-up in 3 months or sooner if any problems are to arise. In the meantime, encouraged to call the office with any questions, concerns, changes symptoms.  Celesta Gentile, DPM

## 2017-08-19 ENCOUNTER — Ambulatory Visit (INDEPENDENT_AMBULATORY_CARE_PROVIDER_SITE_OTHER): Payer: Medicare HMO | Admitting: Family Medicine

## 2017-08-19 ENCOUNTER — Encounter: Payer: Self-pay | Admitting: Family Medicine

## 2017-08-19 DIAGNOSIS — E782 Mixed hyperlipidemia: Secondary | ICD-10-CM | POA: Diagnosis not present

## 2017-08-19 DIAGNOSIS — I10 Essential (primary) hypertension: Secondary | ICD-10-CM

## 2017-08-19 DIAGNOSIS — E559 Vitamin D deficiency, unspecified: Secondary | ICD-10-CM

## 2017-08-19 DIAGNOSIS — M25649 Stiffness of unspecified hand, not elsewhere classified: Secondary | ICD-10-CM | POA: Insufficient documentation

## 2017-08-19 DIAGNOSIS — G6289 Other specified polyneuropathies: Secondary | ICD-10-CM | POA: Diagnosis not present

## 2017-08-19 DIAGNOSIS — E1142 Type 2 diabetes mellitus with diabetic polyneuropathy: Secondary | ICD-10-CM | POA: Diagnosis not present

## 2017-08-19 LAB — TSH: TSH: 1.86 u[IU]/mL (ref 0.35–4.50)

## 2017-08-19 LAB — VITAMIN D 25 HYDROXY (VIT D DEFICIENCY, FRACTURES): VITD: 41.27 ng/mL (ref 30.00–100.00)

## 2017-08-19 NOTE — Assessment & Plan Note (Signed)
Encouraged heart healthy diet, increase exercise, avoid trans fats, consider a krill oil cap daily 

## 2017-08-19 NOTE — Assessment & Plan Note (Signed)
Take a daily suppplements

## 2017-08-19 NOTE — Patient Instructions (Signed)
Cholesterol Cholesterol is a fat. Your body needs a small amount of cholesterol. Cholesterol (plaque) may build up in your blood vessels (arteries). That makes you more likely to have a heart attack or stroke. You cannot feel your cholesterol level. Having a blood test is the only way to find out if your level is high. Keep your test results. Work with your doctor to keep your cholesterol at a good level. What do the results mean?  Total cholesterol is how much cholesterol is in your blood.  LDL is bad cholesterol. This is the type that can build up. Try to have low LDL.  HDL is good cholesterol. It cleans your blood vessels and carries LDL away. Try to have high HDL.  Triglycerides are fat that the body can store or burn for energy. What are good levels of cholesterol?  Total cholesterol below 200.  LDL below 100 is good for people who have health risks. LDL below 70 is good for people who have very high risks.  HDL above 40 is good. It is best to have HDL of 60 or higher.  Triglycerides below 150. How can I lower my cholesterol? Diet Follow your diet program as told by your doctor.  Choose fish, white meat chicken, or turkey that is roasted or baked. Try not to eat red meat, fried foods, sausage, or lunch meats.  Eat lots of fresh fruits and vegetables.  Choose whole grains, beans, pasta, potatoes, and cereals.  Choose olive oil, corn oil, or canola oil. Only use small amounts.  Try not to eat butter, mayonnaise, shortening, or palm kernel oils.  Try not to eat foods with trans fats.  Choose low-fat or nonfat dairy foods. ? Drink skim or nonfat milk. ? Eat low-fat or nonfat yogurt and cheeses. ? Try not to drink whole milk or cream. ? Try not to eat ice cream, egg yolks, or full-fat cheeses.  Healthy desserts include angel food cake, ginger snaps, animal crackers, hard candy, popsicles, and low-fat or nonfat frozen yogurt. Try not to eat pastries, cakes, pies, and  cookies.  Exercise Follow your exercise program as told by your doctor.  Be more active. Try gardening, walking, and taking the stairs.  Ask your doctor about ways that you can be more active.  Medicine  Take over-the-counter and prescription medicines only as told by your doctor. This information is not intended to replace advice given to you by your health care provider. Make sure you discuss any questions you have with your health care provider. Document Released: 05/28/2008 Document Revised: 10/01/2015 Document Reviewed: 09/11/2015 Elsevier Interactive Patient Education  2018 [employer]  

## 2017-08-19 NOTE — Assessment & Plan Note (Signed)
hgba1c acceptable, minimize simple carbs. Increase exercise as tolerated. Continue current meds 

## 2017-08-19 NOTE — Assessment & Plan Note (Addendum)
Diabetic stiff hands is now 100% service connected with the [place] and he continues to have trouble using both hands or gripping and holding things.

## 2017-08-19 NOTE — Assessment & Plan Note (Signed)
Well controlled, no changes to meds. Encouraged heart healthy diet such as the DASH diet and exercise as tolerated.  °

## 2017-08-20 DIAGNOSIS — G629 Polyneuropathy, unspecified: Secondary | ICD-10-CM | POA: Insufficient documentation

## 2017-08-20 NOTE — Progress Notes (Signed)
.   Subjective:    Patient ID: Michael Cunningham, male    DOB: 01-03-1946, 72 y.o.   MRN: [id number]  Chief Complaint  Patient presents with  . Hypertension    6 month follow up, dmv form filled out,[facility] disabilty, lab results  . Hyperlipidemia  . Diabetes    HPI Patient is in today for follow up accompanied by his wife. [facility] and is now 100% service connected for diabetic stiff hand syndrome. Dr Amedeo Plenty accomplished the work up for him. He also notes decreased sensation in his feet. Describes it as a dull decreased sensation in both feet. No recent hospitalizations or febrile illness. Denies CP/palp/SOB/HA/congestion/fevers/GI or GU c/o. Taking meds as prescribed  Past Medical History:  Diagnosis Date  . CAD (coronary artery disease)    a. 05/2014 [place] s/p DES x4 to mid-distLAD and DESx1 to DI  . Complication of anesthesia    11'12 Surgery with excrutiating "head about to explode" upon awakening  -lasted several hours postop  ( SURGERY DONE 08-13-2011 AT [facility] PT DID OK POST OP)  . Diverticulosis of colon    LEFT SIDE  . Exposure to Agent [facility] 10/18/2016   [place]  . History of small bowel obstruction    X2  28 YRS AGO AND LAST ONE 04/2014--  RESOLVED WITHOUT SURGICAL INTERVENTION  . Hx of small bowel obstruction 06/14/2011   Follows with Dr Watt Climes   . Hyperlipidemia   . Hypertension   . Knee pain, bilateral 08/13/2011  . Left ureteral calculus   . Measles =  . Multiple lipomas    hx. mulitple and some remains -arms, legs  . Obesity, unspecified 09/18/2013  . Obstructive sleep apnea 01/01/2016  . Recurrent kidney stones 07/08/2013   Follows with Dr Gaynelle Arabian He believes they are Calcium oxalate stones  . Thrombocytopenia ([place]) 07/07/2014  . Type 2 diabetes mellitus ([place])   . Vitamin D deficiency 07/02/2014    Past Surgical History:  Procedure Laterality Date  . COLONOSCOPY W/ POLYPECTOMY  LAST ONE 2013  . CORONARY STENT PLACEMENT   05/17/2014   PROXIMAL X 4  MID & DISTAL LAD  DIAGONAL   . KNEE ARTHROSCOPY  08/13/2011   right ARTHROSCOPY KNEE;  Surgeon: Gearlean Alf, MD;  Location: [facility];  Service: Orthopedics;  Laterality: Right;  medial  meniscal debridement, excision of plica, synovectomy  . KNEE ARTHROSCOPY Left 11/16/2013   Procedure: LEFT KNEE ARTHROSCOPY MEDIAL MENISCAL DEBRIDEMENT, CHONDROPLASTY;  Surgeon: Gearlean Alf, MD;  Location: [facility];  Service: Orthopedics;  Laterality: Left;  . KNEE ARTHROSCOPY W/ MENISCECTOMY Right 01-29-2011   twice, first trimmed mediscus and cyst  . LEFT HEART CATHETERIZATION WITH CORONARY ANGIOGRAM N/A 05/17/2014   Procedure: LEFT HEART CATHETERIZATION WITH CORONARY ANGIOGRAM;  Surgeon: Burnell Blanks, MD;  Location: [facility] CATH LAB;  Service: Cardiovascular;  Laterality: N/A;  . [place]   multiple, 10-12  . PERCUTANEOUS CORONARY STENT INTERVENTION (PCI-S)  05/17/2014   Procedure: PERCUTANEOUS CORONARY STENT INTERVENTION (PCI-S);  Surgeon: Burnell Blanks, MD;  Location: [facility] CATH LAB;  Service: Cardiovascular;;  Diag1 and Prox to Distal LAD    Family History  Problem Relation Age of Onset  . Hypertension Mother   . Cancer Mother 63       ovarian  . Heart disease Father        CHF  . Heart attack Father 67  . Cancer Sister  breast- just finished her last radiation  . Heart disease Maternal Grandfather        ?  [name] Heart attack Paternal Grandfather 5  . Heart attack Paternal Uncle 52       Sudden death  . Diabetes Neg Hx     Social History   Socioeconomic History  . Marital status: Married    Spouse name: Not on file  . Number of children: 2  . Years of education: Not on file  . Highest education level: Not on file  Occupational History  . Not on file  Social Needs  . Financial resource strain: Not on file  . Food insecurity:    Worry: Not on file    Inability: Not on file  . Transportation needs:    Medical: Not on file     Non-medical: Not on file  Tobacco Use  . Smoking status: Former Smoker    Packs/day: 1.00    Years: 4.00    Pack years: 4.00    Types: Cigarettes    Last attempt to quit: 06/14/1967    Years since quitting: 50.2  . Smokeless tobacco: Never Used  Substance and Sexual Activity  . Alcohol use: No    Alcohol/week: 0.0 oz  . Drug use: No  . Sexual activity: Yes    Comment: lives with wife  Lifestyle  . Physical activity:    Days per week: Not on file    Minutes per session: Not on file  . Stress: Not on file  Relationships  . Social connections:    Talks on phone: Not on file    Gets together: Not on file    Attends religious service: Not on file    Active member of club or organization: Not on file    Attends meetings of clubs or organizations: Not on file    Relationship status: Not on file  . Intimate partner violence:    Fear of current or ex partner: Not on file    Emotionally abused: Not on file    Physically abused: Not on file    Forced sexual activity: Not on file  Other Topics Concern  . 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      Outpatient Medications Prior to Visit  Medication Sig Dispense Refill  . ACCU-CHEK FASTCLIX LANCETS MISC Use to test blood sugar 2 times daily. Dx: E11.40 204 each 3  . acetaminophen (TYLENOL) 500 MG tablet Take 1,000 mg by mouth every 6 (six) hours as needed for mild pain. Reported on 03/31/2015    . aspirin EC 81 MG tablet Take 81 mg by mouth daily with breakfast.     . Cholecalciferol (VITAMIN D PO) Take 5,000 Units by mouth daily.    [name] glucose blood (ACCU-CHEK AVIVA PLUS) test strip Use to test blood sugar 2 times daily. Dx: E11.40 200 each 3  . insulin NPH Human (HUMULIN N) 100 UNIT/ML injection INJECT UNDER SKIN 22-26 UNITS IN THE AM AND 48-52 UNITS AT BEDTIME. 30 mL 3  . insulin regular (HUMULIN R) 100 units/mL injection Inject 0.26-0.35 mLs (26-35 Units total) into the skin 3  (three) times daily before meals. 30 mL 3  . Insulin Syringe-Needle U-100 (TRUEPLUS INSULIN SYRINGE) 30G X 5/16" 0.5 ML MISC USE TO INJECT INSULIN 5 TIMES DAILY. 200 each 3  . losartan (COZAAR) 50 MG tablet TAKE 1&1/2 (75 MG TOTAL) TABLETS BY MOUTH EVERY MORNING (Patient  taking differently: Take 75 mg by mouth daily. TAKE 3 25MG  TABLETS BY MOUTH EVERY MORNING) 180 tablet 3  . metoprolol tartrate (LOPRESSOR) 25 MG tablet Take 1 tablet (25 mg total) by mouth 2 (two) times daily. 180 tablet 3  . Multiple Vitamin (MULTIVITAMIN WITH MINERALS) TABS tablet Take 1 tablet by mouth daily.    . nitroGLYCERIN (NITROSTAT) 0.4 MG SL tablet Place 1 tablet (0.4 mg total) under the tongue every 5 (five) minutes as needed for chest pain. 90 tablet 3  . Polyethylene Glycol 3350 (MIRALAX PO) Take by mouth as needed. Reported on 03/31/2015    . prasugrel (EFFIENT) 10 MG TABS tablet Take 1 tablet (10 mg total) by mouth daily. 90 tablet 3  . Probiotic Product (PROBIOTIC-10 PO) Take 1 tablet by mouth daily.    . simvastatin (ZOCOR) 40 MG tablet TAKE 1 TABLET (40 MG) BY MOUTH AT BEDTIME. 90 tablet 2  . benzonatate (TESSALON) 100 MG capsule Take 1 capsule (100 mg total) by mouth 3 (three) times daily as needed. 30 capsule 0   No facility-administered medications prior to visit.     Allergies  Allergen Reactions  . Ciprofloxacin Other (See Comments)    Abdominal pain  . Lipitor [Atorvastatin Calcium] Other (See Comments)    Severe muscle pain  . Lisinopril Cough  . Metformin And Related Diarrhea    Review of Systems  Constitutional: Negative for fever and malaise/fatigue.  HENT: Negative for congestion.   Eyes: Negative for blurred vision.  Respiratory: Negative for shortness of breath.   Cardiovascular: Negative for chest pain, palpitations and leg swelling.  Gastrointestinal: Negative for abdominal pain, blood in stool and nausea.  Genitourinary: Negative for dysuria and frequency.  Musculoskeletal: Negative  for falls.  Skin: Negative for rash.  Neurological: Positive for sensory change. Negative for dizziness, loss of consciousness and headaches.  Endo/Heme/Allergies: Negative for environmental allergies.  Psychiatric/Behavioral: Negative for depression and hallucinations. The patient is not nervous/anxious.        Objective:    Physical Exam  Constitutional: He is oriented to person, place, and time. He appears well-developed and well-nourished. No distress.  HENT:  Head: Normocephalic and atraumatic.  Nose: Nose normal.  Eyes: Right eye exhibits no discharge. Left eye exhibits no discharge.  Neck: Normal range of motion. Neck supple.  Cardiovascular: Normal rate and regular rhythm.  No murmur heard. Pulmonary/Chest: Effort normal and breath sounds normal.  Abdominal: Soft. Bowel sounds are normal. There is no tenderness.  Musculoskeletal: He exhibits no edema.  Neurological: He is alert and oriented to person, place, and time.  Skin: Skin is warm and dry.  Psychiatric: He has a normal mood and affect.  Nursing note and vitals reviewed.   BP 136/68 (BP Location: Left Arm, Patient Position: Sitting, Cuff Size: Large)   Pulse (!) 55   Temp (!) 97.4 F (36.3 C) (Oral)   Resp 16   Ht 5\' 9"  (1.753 m)   Wt 263 lb (119.3 kg)   SpO2 97%   BMI 38.84 kg/m  Wt Readings from Last 3 Encounters:  08/19/17 263 lb (119.3 kg)  05/24/17 263 lb 6.4 oz (119.5 kg)  02/18/17 264 lb (119.7 kg)     Lab Results  Component Value Date   WBC 6.1 02/09/2017   HGB 14.4 02/09/2017   HCT 42.5 02/09/2017   PLT 164.0 02/09/2017   GLUCOSE 133 (H) 02/09/2017   CHOL 134 02/09/2017   TRIG 137.0 02/09/2017   HDL 31.50 (L)  02/09/2017   LDLDIRECT 108.0 04/16/2014   LDLCALC 75 02/09/2017   ALT 23 02/09/2017   AST 19 02/09/2017   NA 137 02/09/2017   K 4.2 02/09/2017   CL 102 02/09/2017   CREATININE 0.84 02/09/2017   BUN 15 02/09/2017   CO2 28 02/09/2017   TSH 1.86 08/19/2017   PSA 1.24  07/24/2014   INR 1.02 05/17/2014   HGBA1C 7.0 05/24/2017   MICROALBUR 1.7 06/24/2015    Lab Results  Component Value Date   TSH 1.86 08/19/2017   Lab Results  Component Value Date   WBC 6.1 02/09/2017   HGB 14.4 02/09/2017   HCT 42.5 02/09/2017   MCV 94.4 02/09/2017   PLT 164.0 02/09/2017   Lab Results  Component Value Date   NA 137 02/09/2017   K 4.2 02/09/2017   CO2 28 02/09/2017   GLUCOSE 133 (H) 02/09/2017   BUN 15 02/09/2017   CREATININE 0.84 02/09/2017   BILITOT 0.7 02/09/2017   ALKPHOS 77 02/09/2017   AST 19 02/09/2017   ALT 23 02/09/2017   PROT 6.9 02/09/2017   ALBUMIN 3.9 02/09/2017   CALCIUM 9.0 02/09/2017   ANIONGAP 8 05/18/2014   GFR 95.30 02/09/2017   Lab Results  Component Value Date   CHOL 134 02/09/2017   Lab Results  Component Value Date   HDL 31.50 (L) 02/09/2017   Lab Results  Component Value Date   LDLCALC 75 02/09/2017   Lab Results  Component Value Date   TRIG 137.0 02/09/2017   Lab Results  Component Value Date   CHOLHDL 4 02/09/2017   Lab Results  Component Value Date   HGBA1C 7.0 05/24/2017       Assessment & Plan:   Problem List Items Addressed This Visit    Hypertension (Chronic)    Well controlled, no changes to meds. Encouraged heart healthy diet such as the DASH diet and exercise as tolerated.       Relevant Orders   TSH (Completed)   Hyperlipidemia    Encouraged heart healthy diet, increase exercise, avoid trans fats, consider a krill oil cap daily      Vitamin D deficiency    Take a daily suppplements      Relevant Orders   VITAMIN D 25 Hydroxy (Vit-D Deficiency, Fractures) (Completed)   Well controlled type 2 diabetes mellitus with peripheral neuropathy ([place])    hgba1c acceptable, minimize simple carbs. Increase exercise as tolerated. Continue current meds      Stiffness of unspecified hand, not elsewhere classified    Diabetic stiff hands is now 100% service connected with the [place] and he continues  to have trouble using both hands or gripping and holding things.       Peripheral neuropathy    Likely multifactorial from diabetes, agent orange, aging and more. Is tolerable but will continue to monitor consider referral to podiatry at patient discretion.          I have discontinued Lanier Ensign. Mangini "Buddy"'s benzonatate. I am also having him maintain his aspirin EC, multivitamin with minerals, acetaminophen, nitroGLYCERIN, Polyethylene Glycol 3350 (MIRALAX PO), prasugrel, losartan, metoprolol tartrate, ACCU-CHEK FASTCLIX LANCETS, glucose blood, simvastatin, Insulin Syringe-Needle U-100, Probiotic Product (PROBIOTIC-10 PO), Cholecalciferol (VITAMIN D PO), insulin NPH Human, and insulin regular.  No orders of the defined types were placed in this encounter.    Penni Homans, MD

## 2017-08-20 NOTE — Assessment & Plan Note (Signed)
Likely multifactorial from diabetes, agent orange, aging and more. Is tolerable but will continue to monitor consider referral to podiatry at patient discretion.

## 2017-08-23 ENCOUNTER — Encounter: Payer: Self-pay | Admitting: Podiatry

## 2017-09-14 NOTE — Progress Notes (Addendum)
Subjective:   Michael Cunningham is a 72 y.o. male who presents for Medicare Annual/Subsequent preventive examination.  Review of Systems: No ROS.  Medicare Wellness Visit. Additional risk factors are reflected in the social history. Cardiac Risk Factors include: advanced age (>23men, >68 women);diabetes mellitus;dyslipidemia;hypertension;male gender;obesity (BMI >30kg/m2);sedentary lifestyle;smoking/ tobacco exposure;family history of premature cardiovascular disease   Sleep patterns: Wears CPAP. Denies sleep issues.Sleeps 7-8 hrs. Home Safety/Smoke Alarms: Feels safe in home. Smoke alarms in place.  Living environment; residence and Firearm Safety: Lives with wife in 2 story home. Lives on 1st floor.  Eye- yearly. Utd.   Male:   CCS-  Last reported 07/14/11 . Pt states he will schedule. PSA-  Lab Results  Component Value Date   PSA 1.24 07/24/2014       Objective:    Vitals: BP 138/64 (BP Location: Left Arm, Patient Position: Sitting, Cuff Size: Normal)   Pulse (!) 54   Ht 5\' 9"  (1.753 m)   Wt 265 lb 3.2 oz (120.3 kg)   SpO2 98%   BMI 39.16 kg/m   Body mass index is 39.16 kg/m.  Wt Readings from Last 3 Encounters:  09/20/17 265 lb 3.2 oz (120.3 kg)  08/19/17 263 lb (119.3 kg)  05/24/17 263 lb 6.4 oz (119.5 kg)   Temp Readings from Last 3 Encounters:  08/19/17 (!) 97.4 F (36.3 C) (Oral)  02/18/17 97.6 F (36.4 C) (Oral)  12/17/16 98.5 F (36.9 C) (Oral)   BP Readings from Last 3 Encounters:  09/20/17 138/64  08/19/17 136/68  05/24/17 132/64   Pulse Readings from Last 3 Encounters:  09/20/17 (!) 54  08/19/17 (!) 55  05/24/17 63    Advanced Directives 09/20/2017 09/16/2016 08/24/2015 08/18/2015 08/05/2015 07/02/2014 05/17/2014  Does Patient Have a Medical Advance Directive? Yes Yes Yes Yes Yes Yes Yes  Type of Paramedic of [place];Living will [place];Living will [place];Living will [place];Living will [place];Living will [place];Living will [place]  Does patient want to make changes to medical advance directive? No - Patient declined - No - Patient declined No - Patient declined No - Patient declined No - Patient declined No - Patient declined  Copy of [place] in Chart? Yes No - copy requested No - copy requested No - copy requested No - copy requested No - copy requested No - copy requested  Would patient like information on creating a medical advance directive? - - - - - - -  Pre-existing out of facility DNR order (yellow form or pink MOST form) - - - - - - -    Tobacco Social History   Tobacco Use  Smoking Status Former Smoker  . Packs/day: 1.00  . Years: 4.00  . Pack years: 4.00  . Types: Cigarettes  . Last attempt to quit: 06/14/1967  . Years since quitting: 50.3  Smokeless Tobacco Never Used     Counseling given: Not Answered   Clinical Intake: Pain : No/denies pain    Past Medical History:  Diagnosis Date  . CAD (coronary artery disease)    a. 05/2014 [place] s/p DES x4 to mid-distLAD and DESx1 to DI  . Complication of anesthesia    11'12 Surgery with excrutiating "head about to explode" upon awakening  -lasted several hours postop  ( SURGERY DONE 08-13-2011 AT [facility] PT DID OK POST OP)  . Diverticulosis of colon  LEFT SIDE  . Exposure to [employer] 10/18/2016   [place]  . History of small bowel obstruction    X2  28 YRS AGO AND LAST ONE 04/2014--  RESOLVED WITHOUT SURGICAL INTERVENTION  . Hx of small bowel obstruction 06/14/2011   Follows with Dr Watt Climes   . Hyperlipidemia   . Hypertension   . Knee pain, bilateral 08/13/2011  . Left ureteral calculus   . Measles =  . Multiple lipomas    hx. mulitple and some remains -arms, legs  . Obesity, unspecified 09/18/2013  . Obstructive sleep apnea 01/01/2016  . Recurrent kidney stones 07/08/2013   Follows with Dr Gaynelle Arabian He  believes they are Calcium oxalate stones  . Stiffness of joints of both hands   . Thrombocytopenia ([place]) 07/07/2014  . Type 2 diabetes mellitus ([place])   . Vitamin D deficiency 07/02/2014   Past Surgical History:  Procedure Laterality Date  . COLONOSCOPY W/ POLYPECTOMY  LAST ONE 2013  . CORONARY STENT PLACEMENT  05/17/2014   PROXIMAL X 4  MID & DISTAL LAD  DIAGONAL   . KNEE ARTHROSCOPY  08/13/2011   right ARTHROSCOPY KNEE;  Surgeon: Gearlean Alf, MD;  Location: [facility];  Service: Orthopedics;  Laterality: Right;  medial  meniscal debridement, excision of plica, synovectomy  . KNEE ARTHROSCOPY Left 11/16/2013   Procedure: LEFT KNEE ARTHROSCOPY MEDIAL MENISCAL DEBRIDEMENT, CHONDROPLASTY;  Surgeon: Gearlean Alf, MD;  Location: [facility];  Service: Orthopedics;  Laterality: Left;  . KNEE ARTHROSCOPY W/ MENISCECTOMY Right 01-29-2011   twice, first trimmed mediscus and cyst  . LEFT HEART CATHETERIZATION WITH CORONARY ANGIOGRAM N/A 05/17/2014   Procedure: LEFT HEART CATHETERIZATION WITH CORONARY ANGIOGRAM;  Surgeon: Burnell Blanks, MD;  Location: [facility] CATH LAB;  Service: Cardiovascular;  Laterality: N/A;  . Carmi   multiple, 10-12  . PERCUTANEOUS CORONARY STENT INTERVENTION (PCI-S)  05/17/2014   Procedure: PERCUTANEOUS CORONARY STENT INTERVENTION (PCI-S);  Surgeon: Burnell Blanks, MD;  Location: [facility] CATH LAB;  Service: Cardiovascular;;  Diag1 and Prox to Distal LAD   Family History  Problem Relation Age of Onset  . Hypertension Mother   . Cancer Mother 43       ovarian  . Heart disease Father        CHF  . Heart attack Father 67  . Cancer Sister        breast- just finished her last radiation  . Heart disease Maternal Grandfather        ?  [name] Heart attack Paternal Grandfather 63  . Heart attack Paternal Uncle 22       Sudden death  . Diabetes Neg Hx    Social History   Socioeconomic History  . Marital status: Married    Spouse name: Not on file  . Number of  children: 2  . Years of education: Not on file  . Highest education level: Not on file  Occupational History  . Not on file  Social Needs  . Financial resource strain: Not on file  . Food insecurity:    Worry: Not on file    Inability: Not on file  . Transportation needs:    Medical: Not on file    Non-medical: Not on file  Tobacco Use  . Smoking status: Former Smoker    Packs/day: 1.00    Years: 4.00    Pack years: 4.00    Types: Cigarettes    Last attempt to quit: 06/14/1967    Years  since quitting: 50.3  . Smokeless tobacco: Never Used  Substance and Sexual Activity  . Alcohol use: No    Alcohol/week: 0.0 oz  . Drug use: No  . Sexual activity: Yes    Comment: lives with wife  Lifestyle  . Physical activity:    Days per week: Not on file    Minutes per session: Not on file  . Stress: Not on file  Relationships  . Social connections:    Talks on phone: Not on file    Gets together: Not on file    Attends religious service: Not on file    Active member of club or organization: Not on file    Attends meetings of clubs or organizations: Not on file    Relationship status: Not on file  Other Topics Concern  . 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      Outpatient Encounter Medications as of 09/20/2017  Medication Sig  . ACCU-CHEK FASTCLIX LANCETS MISC Use to test blood sugar 2 times daily. Dx: E11.40  . aspirin EC 81 MG tablet Take 81 mg by mouth daily with breakfast.   . Cholecalciferol (VITAMIN D PO) Take 5,000 Units by mouth daily.  [name] glucose blood (ACCU-CHEK AVIVA PLUS) test strip Use to test blood sugar 2 times daily. Dx: E11.40  . insulin NPH Human (HUMULIN N) 100 UNIT/ML injection INJECT UNDER SKIN 22-26 UNITS IN THE AM AND 48-52 UNITS AT BEDTIME.  . insulin regular (HUMULIN R) 100 units/mL injection Inject 0.26-0.35 mLs (26-35 Units total) into the skin 3 (three) times daily before meals.  . Insulin  Syringe-Needle U-100 (TRUEPLUS INSULIN SYRINGE) 30G X 5/16" 0.5 ML MISC USE TO INJECT INSULIN 5 TIMES DAILY.  [name] losartan (COZAAR) 50 MG tablet TAKE 1&1/2 (75 MG TOTAL) TABLETS BY MOUTH EVERY MORNING (Patient taking differently: Take 75 mg by mouth daily. TAKE 3 25MG  TABLETS BY MOUTH EVERY MORNING)  . metoprolol tartrate (LOPRESSOR) 25 MG tablet Take 1 tablet (25 mg total) by mouth 2 (two) times daily.  . Multiple Vitamin (MULTIVITAMIN WITH MINERALS) TABS tablet Take 1 tablet by mouth daily.  . prasugrel (EFFIENT) 10 MG TABS tablet Take 1 tablet (10 mg total) by mouth daily.  . simvastatin (ZOCOR) 40 MG tablet TAKE 1 TABLET (40 MG) BY MOUTH AT BEDTIME.  [name] acetaminophen (TYLENOL) 500 MG tablet Take 1,000 mg by mouth every 6 (six) hours as needed for mild pain. Reported on 03/31/2015  . nitroGLYCERIN (NITROSTAT) 0.4 MG SL tablet Place 1 tablet (0.4 mg total) under the tongue every 5 (five) minutes as needed for chest pain. (Patient not taking: Reported on 09/20/2017)  . Polyethylene Glycol 3350 (MIRALAX PO) Take by mouth as needed. Reported on 03/31/2015  . Probiotic Product (PROBIOTIC-10 PO) Take 1 tablet by mouth daily.   No facility-administered encounter medications on file as of 09/20/2017.     Activities of Daily Living In your present state of health, do you have any difficulty performing the following activities: 09/20/2017  Hearing? N  Vision? N  Difficulty concentrating or making decisions? N  Walking or climbing stairs? N  Dressing or bathing? N  Doing errands, shopping? N  Preparing Food and eating ? N  Using the Toilet? N  In the past six months, have you accidently leaked urine? N  Do you have problems with loss of bowel control? N  Managing your Medications? N  Managing your Finances? N  Housekeeping  or managing your Housekeeping? N  Some recent data might be hidden    Patient Care Team: Mosie Lukes, MD as PCP - General (Family Medicine) Minus Breeding, MD as Consulting  Physician (Cardiology) Philemon Kingdom, MD as Consulting Physician (Endocrinology) Gaynelle Arabian, MD as Consulting Physician (Orthopedic Surgery) Troy Sine, MD as Consulting Physician (Cardiology) Luberta Mutter, MD as Consulting Physician (Ophthalmology) Clarene Essex, MD as Consulting Physician (Gastroenterology) [place], Jennefer Bravo, MD as Consulting Physician (Dermatology) Trula Slade, DPM as Consulting Physician (Podiatry) Roseanne Kaufman, MD as Consulting Physician (Orthopedic Surgery)   Assessment:   This is a routine wellness examination for Kani. Physical assessment deferred to PCP.  Exercise Activities and Dietary recommendations Current Exercise Habits: The patient does not participate in regular exercise at present, Exercise limited by: None identified Diet (meal preparation, eat out, water intake, caffeinated beverages, dairy products, fruits and vegetables): 24 hour recall Breakfast: Cheerios, banana, milk Lunch: varies. Sandwich or leftovers. Dinner: [employer] ( not normal per pt) Pt reports he drinks enough water.     Goals    None      Fall Risk Fall Risk  09/20/2017 09/16/2016 07/01/2015 04/16/2014  Falls in the past year? No No No No    Depression Screen PHQ 2/9 Scores 09/20/2017 09/16/2016 01/01/2016 07/01/2015  PHQ - 2 Score 0 0 0 0    Cognitive Function Ad8 score reviewed for issues:  Issues making decisions:no  Less interest in hobbies / activities:no  Repeats questions, stories (family complaining):no  Trouble using ordinary gadgets (microwave, computer, phone):no  Forgets the month or year: no  Mismanaging finances: no  Remembering appts:no  Daily problems with thinking and/or memory:no Ad8 score is=0  MMSE - Mini Mental State Exam 09/16/2016  Orientation to time 5  Orientation to Place 5  Registration 3  Attention/ Calculation 5  Recall 3  Language- name 2 objects 2  Language- repeat 1  Language- follow 3 step command 3    Language- read & follow direction 1  Write a sentence 1  Copy design 1  Total score 30        Immunization History  Administered Date(s) Administered  . Hepatitis A 10/18/2003  . Hepatitis B 10/18/2003, 11/29/2003  . Influenza, High Dose Seasonal PF 12/19/2015, 12/28/2016  . Influenza,inj,Quad PF,6+ Mos 01/10/2014, 12/26/2014  . Influenza-Unspecified 11/19/2010, 12/03/2011, 12/12/2012  . Mumps 01/29/2004  . Pneumococcal Conjugate-13 01/10/2014  . Pneumococcal Polysaccharide-23 10/18/2003, 03/31/2015  . Td 05/13/1997, 05/21/2009  . Tdap 12/18/2015  . Zoster 06/06/2009   Screening Tests Health Maintenance  Topic Date Due  . FOOT EXAM  06/30/2016  . INFLUENZA VACCINE  10/13/2017  . OPHTHALMOLOGY EXAM  10/18/2017  . HEMOGLOBIN A1C  11/24/2017  . COLONOSCOPY  07/13/2021  . TETANUS/TDAP  12/17/2025  . Hepatitis C Screening  Completed  . PNA vac Low Risk Adult  Completed      Plan:    Please schedule your next medicare wellness visit with me in 1 yr.  Continue to eat heart healthy diet (full of fruits, vegetables, whole grains, lean protein, water--limit salt, fat, and sugar intake) and increase physical activity as tolerated.  Continue doing brain stimulating activities (puzzles, reading, adult coloring books, staying active) to keep memory sharp.   Please schedule colonoscopy as discussed.  I have personally reviewed and noted the following in the patient's chart:   . Medical and social history . Use of alcohol, tobacco or illicit drugs  . Current medications and supplements . Functional  ability and status . Nutritional status . Physical activity . Advanced directives . List of other physicians . Hospitalizations, surgeries, and ER visits in previous 12 months . Vitals . Screenings to include cognitive, depression, and falls . Referrals and appointments  In addition, I have reviewed and discussed with patient certain preventive protocols, quality metrics, and  best practice recommendations. A written personalized care plan for preventive services as well as general preventive health recommendations were provided to patient.     Shela Nevin, [place]  09/20/2017  Medical screening examination/treatment was performed by qualified clinical staff member and as supervising physician I was immediately available for consultation/collaboration. I have reviewed documentation and agree with assessment and plan.  Penni Homans, MD

## 2017-09-19 ENCOUNTER — Ambulatory Visit: Payer: Medicare HMO | Admitting: *Deleted

## 2017-09-20 ENCOUNTER — Ambulatory Visit (INDEPENDENT_AMBULATORY_CARE_PROVIDER_SITE_OTHER): Payer: Medicare HMO | Admitting: *Deleted

## 2017-09-20 ENCOUNTER — Encounter: Payer: Self-pay | Admitting: *Deleted

## 2017-09-20 VITALS — BP 138/64 | HR 54 | Ht 69.0 in | Wt 265.2 lb

## 2017-09-20 DIAGNOSIS — Z0000 Encounter for general adult medical examination without abnormal findings: Secondary | ICD-10-CM

## 2017-09-20 NOTE — Patient Instructions (Signed)
Great to meet you today!!   Please schedule your next medicare wellness visit with me in 1 yr.  Continue to eat heart healthy diet (full of fruits, vegetables, whole grains, lean protein, water--limit salt, fat, and sugar intake) and increase physical activity as tolerated.  Continue doing brain stimulating activities (puzzles, reading, adult coloring books, staying active) to keep memory sharp.   Please schedule colonoscopy as discussed.    Michael Cunningham , Thank you for taking time to come for your Medicare Wellness Visit. I appreciate your ongoing commitment to your health goals. Please review the following plan we discussed and let me know if I can assist you in the future.   These are the goals we discussed: Goals    . Increase physical activity       This is a list of the screening recommended for you and due dates:  Health Maintenance  Topic Date Due  . Complete foot exam   06/30/2016  . Flu Shot  10/13/2017  . Eye exam for diabetics  10/18/2017  . Hemoglobin A1C  11/24/2017  . Colon Cancer Screening  07/13/2021  . Tetanus Vaccine  12/17/2025  .  Hepatitis C: One time screening is recommended by Center for Disease Control  (CDC) for  adults born from [birth year] through 1966.   Completed  . Pneumonia vaccines  Completed    Health Maintenance, Male A healthy lifestyle and preventive care is important for your health and wellness. Ask your health care provider about what schedule of regular examinations is right for you. What should I know about weight and diet? Eat a Healthy Diet  Eat plenty of vegetables, fruits, whole grains, low-fat dairy products, and lean protein.  Do not eat a lot of foods high in solid fats, added sugars, or salt.  Maintain a Healthy Weight Regular exercise can help you achieve or maintain a healthy weight. You should:  Do at least 150 minutes of exercise each week. The exercise should increase your heart rate and make you sweat (moderate-intensity  exercise).  Do strength-training exercises at least twice a week.  Watch Your Levels of Cholesterol and Blood Lipids  Have your blood tested for lipids and cholesterol every 5 years starting at 73 years of age. If you are at high risk for heart disease, you should start having your blood tested when you are 73 years old. You may need to have your cholesterol levels checked more often if: ? Your lipid or cholesterol levels are high. ? You are older than 73 years of age. ? You are at high risk for heart disease.  What should I know about cancer screening? Many types of cancers can be detected early and may often be prevented. Lung Cancer  You should be screened every year for lung cancer if: ? You are a current smoker who has smoked for at least 30 years. ? You are a former smoker who has quit within the past 15 years.  Talk to your health care provider about your screening options, when you should start screening, and how often you should be screened.  Colorectal Cancer  Routine colorectal cancer screening usually begins at 73 years of age and should be repeated every 5-10 years until you are 73 years old. You may need to be screened more often if early forms of precancerous polyps or small growths are found. Your health care provider may recommend screening at an earlier age if you have risk factors for colon cancer.  Your  health care provider may recommend using home test kits to check for hidden blood in the stool.  A small camera at the end of a tube can be used to examine your colon (sigmoidoscopy or colonoscopy). This checks for the earliest forms of colorectal cancer.  Prostate and Testicular Cancer  Depending on your age and overall health, your health care provider may do certain tests to screen for prostate and testicular cancer.  Talk to your health care provider about any symptoms or concerns you have about testicular or prostate cancer.  Skin Cancer  Check your skin  from head to toe regularly.  Tell your health care provider about any new moles or changes in moles, especially if: ? There is a change in a mole's size, shape, or color. ? You have a mole that is larger than a pencil eraser.  Always use sunscreen. Apply sunscreen liberally and repeat throughout the day.  Protect yourself by wearing long sleeves, pants, a wide-brimmed hat, and sunglasses when outside.  What should I know about heart disease, diabetes, and high blood pressure?  If you are 53-26 years of age, have your blood pressure checked every 3-5 years. If you are 98 years of age or older, have your blood pressure checked every year. You should have your blood pressure measured twice-once when you are at a hospital or clinic, and once when you are not at a hospital or clinic. Record the average of the two measurements. To check your blood pressure when you are not at a hospital or clinic, you can use: ? An automated blood pressure machine at a pharmacy. ? A home blood pressure monitor.  Talk to your health care provider about your target blood pressure.  If you are between 70-12 years old, ask your health care provider if you should take aspirin to prevent heart disease.  Have regular diabetes screenings by checking your fasting blood sugar level. ? If you are at a normal weight and have a low risk for diabetes, have this test once every three years after the age of 68. ? If you are overweight and have a high risk for diabetes, consider being tested at a younger age or more often.  A one-time screening for abdominal aortic aneurysm (AAA) by ultrasound is recommended for men aged 60-75 years who are current or former smokers. What should I know about preventing infection? Hepatitis B If you have a higher risk for hepatitis B, you should be screened for this virus. Talk with your health care provider to find out if you are at risk for hepatitis B infection. Hepatitis C Blood testing is  recommended for:  Everyone born from [birth year] through 1966.  Anyone with known risk factors for hepatitis C.  Sexually Transmitted Diseases (STDs)  You should be screened each year for STDs including gonorrhea and chlamydia if: ? You are sexually active and are younger than 73 years of age. ? You are older than 73 years of age and your health care provider tells you that you are at risk for this type of infection. ? Your sexual activity has changed since you were last screened and you are at an increased risk for chlamydia or gonorrhea. Ask your health care provider if you are at risk.  Talk with your health care provider about whether you are at high risk of being infected with HIV. Your health care provider may recommend a prescription medicine to help prevent HIV infection.  What else can I do?  Schedule regular health, dental, and eye exams.  Stay current with your vaccines (immunizations).  Do not use any tobacco products, such as cigarettes, chewing tobacco, and e-cigarettes. If you need help quitting, ask your health care provider.  Limit alcohol intake to no more than 2 drinks per day. One drink equals 12 ounces of beer, 5 ounces of wine, or 1 ounces of hard liquor.  Do not use street drugs.  Do not share needles.  Ask your health care provider for help if you need support or information about quitting drugs.  Tell your health care provider if you often feel depressed.  Tell your health care provider if you have ever been abused or do not feel safe at home. This information is not intended to replace advice given to you by your health care provider. Make sure you discuss any questions you have with your health care provider. Document Released: 08/28/2007 Document Revised: 10/29/2015 Document Reviewed: 12/03/2014 Elsevier Interactive Patient Education  [employer].

## 2017-09-22 ENCOUNTER — Ambulatory Visit: Payer: Medicare HMO | Admitting: Internal Medicine

## 2017-10-06 ENCOUNTER — Ambulatory Visit: Payer: Medicare HMO | Admitting: Podiatry

## 2017-10-06 ENCOUNTER — Encounter: Payer: Self-pay | Admitting: Podiatry

## 2017-10-06 DIAGNOSIS — E1149 Type 2 diabetes mellitus with other diabetic neurological complication: Secondary | ICD-10-CM | POA: Diagnosis not present

## 2017-10-06 DIAGNOSIS — M79676 Pain in unspecified toe(s): Secondary | ICD-10-CM

## 2017-10-06 DIAGNOSIS — B351 Tinea unguium: Secondary | ICD-10-CM

## 2017-10-06 NOTE — Progress Notes (Signed)
Subjective: 72 y.o. returns the office today for painful, elongated, thickened toenails which he cannot trim himself. Denies any redness or drainage around the nails. He does feel that his neuropathy has been getting worse.  There is some sharp pains at nighttime.  Although the neuropathy is getting worse he is still hesitant does not want to proceed with treatment at this point.  He apparently had a recent nerve test that did show that the neuropathy has been getting somewhat worse.  Denies any acute changes since last appointment and no new complaints today. Denies any systemic complaints such as fevers, chills, nausea, vomiting.   PCP: Mosie Lukes, MD  Endocrinologist: Dr. Benjiman Core, MD   Objective: AAO 3, NAD DP/PT pulses palpable, CRT less than 3 seconds Sensation is mildly decreased with Raymond Gurney monofilament to the left hallux as well as the right submetatarsal area.  There is decreased vibratory sensation of the right foot worse than left.  Subjectively is getting more sharp pains at nighttime. Nails hypertrophic, dystrophic, elongated, brittle, discolored 10. There is tenderness overlying the nails 1-5 bilaterally. There is no surrounding erythema or drainage along the nail sites. No open lesions or pre-ulcerative lesions are identified. No other areas of tenderness bilateral lower extremities. No overlying edema, erythema, increased warmth. No pain with calf compression, swelling, warmth, erythema.  Assessment: Patient presents with symptomatic onychomycosis; worsening neuropathy symptoms.  Plan: -Treatment options including alternatives, risks, complications were discussed -Nails sharply debrided 10 without complication/bleeding. -We discussed neuropathy as well as etiology and progression of neuropathy.  Although it seems that his symptoms are getting worse he still does not want to proceed with treatment.  We will continue to monitor should symptoms worsen we will  reconsider treatment. -Discussed daily foot inspection. If there are any changes, to call the office immediately.  -Follow-up in 3 months or sooner if any problems are to arise. In the meantime, encouraged to call the office with any questions, concerns, changes symptoms.  Celesta Gentile, DPM

## 2017-10-25 DIAGNOSIS — E119 Type 2 diabetes mellitus without complications: Secondary | ICD-10-CM | POA: Diagnosis not present

## 2017-10-25 DIAGNOSIS — H2513 Age-related nuclear cataract, bilateral: Secondary | ICD-10-CM | POA: Diagnosis not present

## 2017-10-25 DIAGNOSIS — H52203 Unspecified astigmatism, bilateral: Secondary | ICD-10-CM | POA: Diagnosis not present

## 2017-10-25 DIAGNOSIS — H35372 Puckering of macula, left eye: Secondary | ICD-10-CM | POA: Diagnosis not present

## 2017-10-25 LAB — HM DIABETES EYE EXAM

## 2017-11-01 ENCOUNTER — Encounter: Payer: Self-pay | Admitting: Family Medicine

## 2017-11-18 DIAGNOSIS — G4733 Obstructive sleep apnea (adult) (pediatric): Secondary | ICD-10-CM | POA: Diagnosis not present

## 2017-11-22 ENCOUNTER — Ambulatory Visit: Payer: Medicare HMO | Admitting: Internal Medicine

## 2017-11-29 ENCOUNTER — Encounter: Payer: Self-pay | Admitting: Internal Medicine

## 2017-11-29 ENCOUNTER — Ambulatory Visit (INDEPENDENT_AMBULATORY_CARE_PROVIDER_SITE_OTHER): Payer: Medicare HMO | Admitting: Internal Medicine

## 2017-11-29 VITALS — BP 110/68 | HR 63 | Ht 69.0 in | Wt 262.0 lb

## 2017-11-29 DIAGNOSIS — E1142 Type 2 diabetes mellitus with diabetic polyneuropathy: Secondary | ICD-10-CM

## 2017-11-29 DIAGNOSIS — E782 Mixed hyperlipidemia: Secondary | ICD-10-CM | POA: Diagnosis not present

## 2017-11-29 LAB — POCT GLYCOSYLATED HEMOGLOBIN (HGB A1C): HEMOGLOBIN A1C: 6.3 % — AB (ref 4.0–5.6)

## 2017-11-29 MED ORDER — INSULIN REGULAR HUMAN 100 UNIT/ML IJ SOLN: 20.0000 [IU] | Freq: Three times a day (TID) | INTRAMUSCULAR | 3 refills | Status: DC

## 2017-11-29 MED ORDER — GLUCAGON (RDNA) 1 MG IJ KIT
1.0000 mg | PACK | Freq: Once | INTRAMUSCULAR | 12 refills | Status: AC | PRN
Start: 2017-11-29 — End: ?

## 2017-11-29 MED ORDER — INSULIN NPH (HUMAN) (ISOPHANE) 100 UNIT/ML ~~LOC~~ SUSP: SUBCUTANEOUS | 3 refills | Status: DC

## 2017-11-29 NOTE — Patient Instructions (Addendum)
Please decrease: Insulin Before breakfast Before lunch Before dinner Bedtime  Regular 20-25 25-30 30-35   NPH 20   44   Please do not use more than 20 units of R insulin before a meal prior to exercise (rehab, working in the yard, etc.).  Please return in 3 months.

## 2017-11-29 NOTE — Progress Notes (Signed)
Patient ID: Michael Cunningham, male   DOB: Jul 22, 1945, 72 y.o.   MRN: [id number]  HPI: Michael BOQUET is a 72 y.o.-year-old male, returning for f/u DM2, dx in 1999, insulin-dependent since ~2008, controlled, with complications (CAD, peripheral neuropathy, DR). Last visit 6 months ago. He is 60% disabled >> highest priority group within his insurance.  He had a low CBG 10/07/2017: 35 - was very active that day, but did not reduce the insulin dose, as discussed.  Wife gave him sugar and he recovered, but they both appear shaken by the incident.  Last hemoglobin A1c: 05/2016: HbA1c calculated from the fructosamine is better, at 5.9%. 10/01/2015: HbA1c calculated from fructosamine is still great, at 6.6%. 06/24/2015: HbA1c calculated from fructosamine is much lower, at 6.4%. Lab Results  Component Value Date   HGBA1C 7.0 05/24/2017   HGBA1C 7.6 (H) 02/09/2017   HGBA1C 7.3 (H) 04/23/2016   Pt is on a regimen of:  Insulin Before breakfast Before Cunningham Before dinner Bedtime  Regular  25-30  30-35  35   NPH  22    48   Please do not use more than 20 units of R insulin before a meal prior to exercise (rehab, working in the yard, etc.).  He cannot afford analog insulin. He tried Metformin R and XR >> diarrhea He tried Januvia.  Pt checks his sugars 1-2 times a day: - am:105-142, 219 >> 110-142, 162 >> 106-146 - 2h after b'fast: 177 >> n.c >> 163 >> 71-190, 246 - before Cunningham:  133-160 >> 80-123, 184, 193 >> 68-77 - 2h after Cunningham: n/c >> 76 >> 70 >> n/c  - before dinner:94-133 >> 71 >> 76-121 >> 68-136, 244 - 2h after dinner: n/c >> 174 - bedtime: 137-154 >> n/c >> 115-122 >> 69-196 - nighttime:90 >> n/c Lowest sugar was 71 >> 76 >> 35 x1; he has hypoglycemia awareness in the 90s. Highest sugar was 219 >> 193 >> 246.  Glucometer: AccuChek Aviva >> Precision Extra by [employer] (given by [facility]).  Pt's meals are: - Breakfast: cheerios + skim milk + banana + strawberries - Cunningham: sandwich or  leftover  - Dinner: meat + veggies + starch - Snacks: PB crackers; wheat thins, apple or grapes, pear   -No CKD, last BUN/creatinine:  Lab Results  Component Value Date   BUN 15 02/09/2017   CREATININE 0.84 02/09/2017  On losartan. -+ HL; last set of lipids: Lab Results  Component Value Date   CHOL 134 02/09/2017   HDL 31.50 (L) 02/09/2017   LDLCALC 75 02/09/2017   LDLDIRECT 108.0 04/16/2014   TRIG 137.0 02/09/2017   CHOLHDL 4 02/09/2017   On simvastatin. - last eye exam was in 10/2017.  He has a history of mild DR, but not at last check.  He does have angle-closure glaucoma and had surgery in both eyes. Dr. Ellie Cunningham. -No numbness and tingling in his L foot - [facility] (Dr Michael Cunningham).  He does have mild peripheral neuropathy symptoms in his right foot  He was dx'ed with Diabetic hand sd. (cheiroarthropathy) >> had steroid inj.  He also has a history of HTN. He has vitamin D def >> was on Ergocalciferol >> now on 5000 units vitamin D3 daily  ROS: Constitutional: no weight gain/no weight loss, no fatigue, no subjective hyperthermia, no subjective hypothermia Eyes: no blurry vision, no xerophthalmia ENT: no sore throat, no nodules palpated in throat, no dysphagia, no odynophagia, no hoarseness Cardiovascular: no CP/no SOB/no palpitations/no  leg swelling Respiratory: no cough/no SOB/no wheezing Gastrointestinal: no N/no V/no D/no C/no acid reflux Musculoskeletal: no muscle aches/no joint aches Skin: + Rash (stasis dermatitis bilateral legs), no hair loss Neurological: no tremors/no numbness/no tingling/no dizziness  I reviewed pt's medications, allergies, PMH, social hx, family hx, and changes were documented in the history of present illness. Otherwise, unchanged from my initial visit note.  Past Medical History:  Diagnosis Date  . CAD (coronary artery disease)    a. 05/2014 [place] s/p DES x4 to mid-distLAD and DESx1 to DI  . Complication of anesthesia    11'12 Surgery  with excrutiating "head about to explode" upon awakening  -lasted several hours postop  ( SURGERY DONE 08-13-2011 AT [facility] PT DID OK POST OP)  . Diverticulosis of colon    LEFT SIDE  . Exposure to [facility] 10/18/2016   [place]  . History of small bowel obstruction    X2  28 YRS AGO AND LAST ONE 04/2014--  RESOLVED WITHOUT SURGICAL INTERVENTION  . Hx of small bowel obstruction 06/14/2011   Follows with Dr Watt Climes   . Hyperlipidemia   . Hypertension   . Knee pain, bilateral 08/13/2011  . Left ureteral calculus   . Measles =  . Multiple lipomas    hx. mulitple and some remains -arms, legs  . Obesity, unspecified 09/18/2013  . Obstructive sleep apnea 01/01/2016  . Recurrent kidney stones 07/08/2013   Follows with Dr Gaynelle Arabian He believes they are Calcium oxalate stones  . Stiffness of joints of both hands   . Thrombocytopenia ([place]) 07/07/2014  . Type 2 diabetes mellitus ([place])   . Vitamin D deficiency 07/02/2014   Past Surgical History:  Procedure Laterality Date  . COLONOSCOPY W/ POLYPECTOMY  LAST ONE 2013  . CORONARY STENT PLACEMENT  05/17/2014   PROXIMAL X 4  MID & DISTAL LAD  DIAGONAL   . KNEE ARTHROSCOPY  08/13/2011   right ARTHROSCOPY KNEE;  Surgeon: Gearlean Alf, MD;  Location: [facility];  Service: Orthopedics;  Laterality: Right;  medial  meniscal debridement, excision of plica, synovectomy  . KNEE ARTHROSCOPY Left 11/16/2013   Procedure: LEFT KNEE ARTHROSCOPY MEDIAL MENISCAL DEBRIDEMENT, CHONDROPLASTY;  Surgeon: Gearlean Alf, MD;  Location: [facility];  Service: Orthopedics;  Laterality: Left;  . KNEE ARTHROSCOPY W/ MENISCECTOMY Right 01-29-2011   twice, first trimmed mediscus and cyst  . LEFT HEART CATHETERIZATION WITH CORONARY ANGIOGRAM N/A 05/17/2014   Procedure: LEFT HEART CATHETERIZATION WITH CORONARY ANGIOGRAM;  Surgeon: Burnell Blanks, MD;  Location: [facility] CATH LAB;  Service: Cardiovascular;  Laterality: N/A;  . Stamps   multiple, 10-12  . PERCUTANEOUS CORONARY  STENT INTERVENTION (PCI-S)  05/17/2014   Procedure: PERCUTANEOUS CORONARY STENT INTERVENTION (PCI-S);  Surgeon: Burnell Blanks, MD;  Location: [facility] CATH LAB;  Service: Cardiovascular;;  Diag1 and Prox to Distal LAD   History   Social History  . Marital Status: Married    Spouse Name: N/A  . Number of Children: 2   Social History Main Topics  . Smoking status: Former Smoker -- 1.00 packs/day for 4 years    Types: Cigarettes    Quit date: 06/14/1967  . Smokeless tobacco: Never Used  . Alcohol Use: No  . Drug Use: No  . 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     Current Outpatient Medications on File Prior to Visit  Medication Sig Dispense Refill  . ACCU-CHEK FASTCLIX LANCETS MISC Use to test blood sugar 2 times daily. Dx: E11.40 204 each 3  . acetaminophen (TYLENOL) 500 MG tablet Take 1,000 mg by mouth every 6 (six) hours as needed for mild pain. Reported on 03/31/2015    . aspirin EC 81 MG tablet Take 81 mg by mouth daily with breakfast.     . Cholecalciferol (VITAMIN D PO) Take 5,000 Units by mouth daily.    [name] glucose blood (ACCU-CHEK AVIVA PLUS) test strip Use to test blood sugar 2 times daily. Dx: E11.40 200 each 3  . insulin NPH Human (HUMULIN N) 100 UNIT/ML injection INJECT UNDER SKIN 22-26 UNITS IN THE AM AND 48-52 UNITS AT BEDTIME. 30 mL 3  . insulin regular (HUMULIN R) 100 units/mL injection Inject 0.26-0.35 mLs (26-35 Units total) into the skin 3 (three) times daily before meals. 30 mL 3  . Insulin Syringe-Needle U-100 (TRUEPLUS INSULIN SYRINGE) 30G X 5/16" 0.5 ML MISC USE TO INJECT INSULIN 5 TIMES DAILY. 200 each 3  . losartan (COZAAR) 50 MG tablet TAKE 1&1/2 (75 MG TOTAL) TABLETS BY MOUTH EVERY MORNING (Patient taking differently: Take 75 mg by mouth daily. TAKE 3 25MG  TABLETS BY MOUTH EVERY MORNING) 180 tablet 3  . metoprolol tartrate (LOPRESSOR) 25 MG tablet Take 1 tablet (25 mg  total) by mouth 2 (two) times daily. 180 tablet 3  . Multiple Vitamin (MULTIVITAMIN WITH MINERALS) TABS tablet Take 1 tablet by mouth daily.    . nitroGLYCERIN (NITROSTAT) 0.4 MG SL tablet Place 1 tablet (0.4 mg total) under the tongue every 5 (five) minutes as needed for chest pain. 90 tablet 3  . Polyethylene Glycol 3350 (MIRALAX PO) Take by mouth as needed. Reported on 03/31/2015    . prasugrel (EFFIENT) 10 MG TABS tablet Take 1 tablet (10 mg total) by mouth daily. 90 tablet 3  . Probiotic Product (PROBIOTIC-10 PO) Take 1 tablet by mouth daily.    . simvastatin (ZOCOR) 40 MG tablet TAKE 1 TABLET (40 MG) BY MOUTH AT BEDTIME. 90 tablet 2   No current facility-administered medications on file prior to visit.    Allergies  Allergen Reactions  . Ciprofloxacin Other (See Comments)    Abdominal pain  . Lipitor [Atorvastatin Calcium] Other (See Comments)    Severe muscle pain  . Lisinopril Cough  . Metformin And Related Diarrhea   Family History  Problem Relation Age of Onset  . Hypertension Mother   . Cancer Mother 47       ovarian  . Heart disease Father        CHF  . Heart attack Father 70  . Cancer Sister        breast- just finished her last radiation  . Heart disease Maternal Grandfather        ?  [name] Heart attack Paternal Grandfather 47  . Heart attack Paternal Uncle 56       Sudden death  . Diabetes Neg Hx    PE: BP 110/68   Pulse 63   Ht 5\' 9"  (1.753 m)   Wt 262 lb (118.8 kg)   SpO2 97%   BMI 38.69 kg/m  Body mass index is 38.69 kg/m.  Wt Readings from Last 3 Encounters:  11/29/17 262 lb (118.8 kg)  09/20/17 265 lb 3.2 oz (120.3 kg)  08/19/17 263 lb (119.3 kg)   Constitutional: overweight, in NAD Eyes: PERRLA, EOMI, no exophthalmos ENT: moist mucous membranes, no thyromegaly, no cervical  lymphadenopathy Cardiovascular: RRR, No MRG Respiratory: CTA B Gastrointestinal: abdomen soft, NT, ND, BS+ Musculoskeletal: no deformities, strength intact in all 4 Skin:  moist, warm, no rashes, + vitiligo on arms Neurological: no tremor with outstretched hands, DTR normal in all 4  ASSESSMENT: 1. DM2, insulin-dependent, now better controlled, with complications - CAD, status post stent 05/2014 - Dr Percival Spanish - Peripheral neuropathy - DR  2. HL  PLAN:  1. Patient with long-standing, fairly well-controlled diabetes, with a significant low blood sugar developed since last visit, after he was very active 1 day and did not reduce his regular insulin with dinner.  I advised him at last visit to not use more than 20 units of regular insulin before a meal if he is active around that time, however, he took 35 units at night.  He dropped his sugars to 35 and had impaired consciousness at that time.  Wife gave him sugar and he recovered.  Reviewing his carefully kept log, he has recurrent lows in the 60s and 70s at this visit.  They happen usually around the middle of the day, but also in the evening.  At this visit, I advised him to reduce the doses of both insulins, at all meals, and also both NPH doses to avoid any chance of significant hypoglycemia.  I again emphasized the fact that he needs to take no more than 20 units of R insulin if he is more active before after that meal. - Refilled his prescriptions and printed them for him to take him to the [place].  I also gave him a prescription for glucagon.  Wife is a Marine scientist and knows how to use it. - I advised him to: Patient Instructions   Please decrease: Insulin Before breakfast Before Cunningham Before dinner Bedtime  Regular 20-25 25-30 30-35   NPH 20   44   Please do not use more than 20 units of R insulin before a meal prior to exercise (rehab, working in the yard, etc.).  Please return in 3 months.  - today, HbA1c is 6.3% -we will not check a fructosamine level today since this has significantly improved and is at goal - continue checking sugars at different times of the day - check 1x a day, rotating checks -  advised for yearly eye exams >> he is UTD - He will get the flu shot through the [place] - Return to clinic in 3 mo with sugar log   2. HL - Reviewed latest lipid panel from 01/2017: LDL decreased, triglycerides normal, HDL slightly low Lab Results  Component Value Date   CHOL 134 02/09/2017   HDL 31.50 (L) 02/09/2017   LDLCALC 75 02/09/2017   LDLDIRECT 108.0 04/16/2014   TRIG 137.0 02/09/2017   CHOLHDL 4 02/09/2017  - Continues Zocor without side effects.  - time spent with the patient: 25 minutes, of which >50% was spent in obtaining information about his symptoms, reviewing his previous labs, evaluations, and treatments, counseling him about  how to manage hypoglycemia and also how to change his insulin doses to avoid hyper and hypoglycemia.  His wife had several questions that I answered.   Philemon Kingdom, MD PhD [facility] Endocr[facility]

## 2017-11-29 NOTE — Addendum Note (Signed)
Addended by: Cardell Peach I on: 11/29/2017 09:40 AM   Modules accepted: Orders

## 2017-12-05 ENCOUNTER — Encounter: Payer: Self-pay | Admitting: Family Medicine

## 2017-12-05 ENCOUNTER — Ambulatory Visit (INDEPENDENT_AMBULATORY_CARE_PROVIDER_SITE_OTHER): Payer: Medicare HMO | Admitting: Family Medicine

## 2017-12-05 DIAGNOSIS — E559 Vitamin D deficiency, unspecified: Secondary | ICD-10-CM

## 2017-12-05 DIAGNOSIS — E1142 Type 2 diabetes mellitus with diabetic polyneuropathy: Secondary | ICD-10-CM | POA: Diagnosis not present

## 2017-12-05 DIAGNOSIS — I10 Essential (primary) hypertension: Secondary | ICD-10-CM

## 2017-12-05 DIAGNOSIS — E782 Mixed hyperlipidemia: Secondary | ICD-10-CM

## 2017-12-05 DIAGNOSIS — G6289 Other specified polyneuropathies: Secondary | ICD-10-CM | POA: Diagnosis not present

## 2017-12-05 NOTE — Assessment & Plan Note (Addendum)
hgba1c acceptable, minimize simple carbs. Increase exercise as tolerated. No erythema on base of toe.  Toes are warm

## 2017-12-05 NOTE — Assessment & Plan Note (Addendum)
Numbness in balls of feet, b/l large toes mostly on top of toes most notably. He has erythema on top of 2nd, 3rd, 4th toes bilaterally he reports it has been worsening over a number of years and is working with the [place] service connection.

## 2017-12-05 NOTE — Patient Instructions (Signed)

## 2017-12-05 NOTE — Assessment & Plan Note (Signed)
Tolerating statin, encouraged heart healthy diet, avoid trans fats, minimize simple carbs and saturated fats.  

## 2017-12-05 NOTE — Assessment & Plan Note (Signed)
Well controlled, no changes to meds. Encouraged heart healthy diet such as the DASH diet and exercise as tolerated.  °

## 2017-12-05 NOTE — Progress Notes (Signed)
Subjective:    Patient ID: Michael Cunningham, male    DOB: 1945-12-26, 72 y.o.   MRN: [id number]  Chief Complaint  Patient presents with  . Numbness    complains of numbness and pain on both feet     HPI Patient is in today for follow up. He is struggling with worsening peripheral neuropathy in both feet. He notes his frustration regarding his ongoing discomfort. He is following with endocrinology and podiatry . No recent febrile illness or hospitalizations. Denies CP/palp/SOB/HA/congestion/fevers/GI or GU c/o. Taking meds as prescribed   Past Medical History:  Diagnosis Date  . CAD (coronary artery disease)    a. 05/2014 [place] s/p DES x4 to mid-distLAD and DESx1 to DI  . Complication of anesthesia    11'12 Surgery with excrutiating "head about to explode" upon awakening  -lasted several hours postop  ( SURGERY DONE 08-13-2011 AT [facility] PT DID OK POST OP)  . Diverticulosis of colon    LEFT SIDE  . Exposure to Agent [facility] 10/18/2016   [place]  . History of small bowel obstruction    X2  28 YRS AGO AND LAST ONE 04/2014--  RESOLVED WITHOUT SURGICAL INTERVENTION  . Hx of small bowel obstruction 06/14/2011   Follows with Dr Watt Climes   . Hyperlipidemia   . Hypertension   . Knee pain, bilateral 08/13/2011  . Left ureteral calculus   . Measles =  . Multiple lipomas    hx. mulitple and some remains -arms, legs  . Obesity, unspecified 09/18/2013  . Obstructive sleep apnea 01/01/2016  . Recurrent kidney stones 07/08/2013   Follows with Dr Gaynelle Arabian He believes they are Calcium oxalate stones  . Stiffness of joints of both hands   . Thrombocytopenia ([place]) 07/07/2014  . Type 2 diabetes mellitus ([place])   . Vitamin D deficiency 07/02/2014    Past Surgical History:  Procedure Laterality Date  . COLONOSCOPY W/ POLYPECTOMY  LAST ONE 2013  . CORONARY STENT PLACEMENT  05/17/2014   PROXIMAL X 4  MID & DISTAL LAD  DIAGONAL   . KNEE ARTHROSCOPY  08/13/2011   right ARTHROSCOPY KNEE;  Surgeon: Gearlean Alf,  MD;  Location: [facility];  Service: Orthopedics;  Laterality: Right;  medial  meniscal debridement, excision of plica, synovectomy  . KNEE ARTHROSCOPY Left 11/16/2013   Procedure: LEFT KNEE ARTHROSCOPY MEDIAL MENISCAL DEBRIDEMENT, CHONDROPLASTY;  Surgeon: Gearlean Alf, MD;  Location: [facility];  Service: Orthopedics;  Laterality: Left;  . KNEE ARTHROSCOPY W/ MENISCECTOMY Right 01-29-2011   twice, first trimmed mediscus and cyst  . LEFT HEART CATHETERIZATION WITH CORONARY ANGIOGRAM N/A 05/17/2014   Procedure: LEFT HEART CATHETERIZATION WITH CORONARY ANGIOGRAM;  Surgeon: Burnell Blanks, MD;  Location: [facility] CATH LAB;  Service: Cardiovascular;  Laterality: N/A;  . [place]   multiple, 10-12  . PERCUTANEOUS CORONARY STENT INTERVENTION (PCI-S)  05/17/2014   Procedure: PERCUTANEOUS CORONARY STENT INTERVENTION (PCI-S);  Surgeon: Burnell Blanks, MD;  Location: [facility] CATH LAB;  Service: Cardiovascular;;  Diag1 and Prox to Distal LAD    Family History  Problem Relation Age of Onset  . Hypertension Mother   . Cancer Mother 34       ovarian  . Heart disease Father        CHF  . Heart attack Father 39  . Cancer Sister        breast- just finished her last radiation  . Heart disease Maternal Grandfather        ?  [name]  Heart attack Paternal Grandfather 36  . Heart attack Paternal Uncle 25       Sudden death  . Diabetes Neg Hx     Social History   Socioeconomic History  . Marital status: Married    Spouse name: Not on file  . Number of children: 2  . Years of education: Not on file  . Highest education level: Not on file  Occupational History  . Not on file  Social Needs  . Financial resource strain: Not on file  . Food insecurity:    Worry: Not on file    Inability: Not on file  . Transportation needs:    Medical: Not on file    Non-medical: Not on file  Tobacco Use  . Smoking status: Former Smoker    Packs/day: 1.00    Years: 4.00    Pack years: 4.00    Types:  Cigarettes    Last attempt to quit: 06/14/1967    Years since quitting: 50.5  . Smokeless tobacco: Never Used  Substance and Sexual Activity  . Alcohol use: No    Alcohol/week: 0.0 standard drinks  . Drug use: No  . Sexual activity: Yes    Comment: lives with wife  Lifestyle  . Physical activity:    Days per week: Not on file    Minutes per session: Not on file  . Stress: Not on file  Relationships  . Social connections:    Talks on phone: Not on file    Gets together: Not on file    Attends religious service: Not on file    Active member of club or organization: Not on file    Attends meetings of clubs or organizations: Not on file    Relationship status: Not on file  . Intimate partner violence:    Fear of current or ex partner: Not on file    Emotionally abused: Not on file    Physically abused: Not on file    Forced sexual activity: Not on file  Other Topics Concern  . 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      Outpatient Medications Prior to Visit  Medication Sig Dispense Refill  . ACCU-CHEK FASTCLIX LANCETS MISC Use to test blood sugar 2 times daily. Dx: E11.40 204 each 3  . acetaminophen (TYLENOL) 500 MG tablet Take 1,000 mg by mouth every 6 (six) hours as needed for mild pain. Reported on 03/31/2015    . aspirin EC 81 MG tablet Take 81 mg by mouth daily with breakfast.     . Cholecalciferol (VITAMIN D PO) Take 5,000 Units by mouth daily.    [name] glucagon (GLUCAGON EMERGENCY) 1 MG injection Inject 1 mg into the muscle once as needed for up to 1 dose. 1 each 12  . glucose blood (ACCU-CHEK AVIVA PLUS) test strip Use to test blood sugar 2 times daily. Dx: E11.40 200 each 3  . insulin NPH Human (HUMULIN N) 100 UNIT/ML injection INJECT UNDER SKIN 20 UNITS IN THE AM AND 44 UNITS AT BEDTIME. 30 mL 3  . insulin regular (HUMULIN R) 100 units/mL injection Inject 0.2-0.35 mLs (20-35 Units total) into the skin 3 (three) times daily  before meals. 30 mL 3  . Insulin Syringe-Needle U-100 (TRUEPLUS INSULIN SYRINGE) 30G X 5/16" 0.5 ML MISC USE TO INJECT INSULIN 5 TIMES DAILY. 200 each 3  . losartan (COZAAR) 50 MG tablet TAKE 1&1/2 (75 MG TOTAL) TABLETS BY  MOUTH EVERY MORNING (Patient taking differently: Take 75 mg by mouth daily. TAKE 3 25MG  TABLETS BY MOUTH EVERY MORNING) 180 tablet 3  . metoprolol tartrate (LOPRESSOR) 25 MG tablet Take 1 tablet (25 mg total) by mouth 2 (two) times daily. 180 tablet 3  . Multiple Vitamin (MULTIVITAMIN WITH MINERALS) TABS tablet Take 1 tablet by mouth daily.    . nitroGLYCERIN (NITROSTAT) 0.4 MG SL tablet Place 1 tablet (0.4 mg total) under the tongue every 5 (five) minutes as needed for chest pain. 90 tablet 3  . Polyethylene Glycol 3350 (MIRALAX PO) Take by mouth as needed. Reported on 03/31/2015    . prasugrel (EFFIENT) 10 MG TABS tablet Take 1 tablet (10 mg total) by mouth daily. 90 tablet 3  . Probiotic Product (PROBIOTIC-10 PO) Take 1 tablet by mouth daily.    . simvastatin (ZOCOR) 40 MG tablet TAKE 1 TABLET (40 MG) BY MOUTH AT BEDTIME. 90 tablet 2   No facility-administered medications prior to visit.     Allergies  Allergen Reactions  . Ciprofloxacin Other (See Comments)    Abdominal pain  . Lipitor [Atorvastatin Calcium] Other (See Comments)    Severe muscle pain  . Lisinopril Cough  . Metformin And Related Diarrhea    Review of Systems  Constitutional: Negative for fever and malaise/fatigue.  HENT: Negative for congestion.   Eyes: Negative for blurred vision.  Respiratory: Negative for shortness of breath.   Cardiovascular: Negative for chest pain, palpitations and leg swelling.  Gastrointestinal: Negative for abdominal pain, blood in stool and nausea.  Genitourinary: Negative for dysuria and frequency.  Musculoskeletal: Negative for falls.  Skin: Negative for rash.  Neurological: Negative for dizziness, loss of consciousness and headaches.  Endo/Heme/Allergies: Negative  for environmental allergies.  Psychiatric/Behavioral: Negative for depression. The patient is not nervous/anxious.        Objective:    Physical Exam  Constitutional: He is oriented to person, place, and time. He appears well-developed and well-nourished. No distress.  HENT:  Head: Normocephalic and atraumatic.  Nose: Nose normal.  Eyes: Right eye exhibits no discharge. Left eye exhibits no discharge.  Neck: Normal range of motion. Neck supple.  Cardiovascular: Normal rate and regular rhythm.  No murmur heard. Pulmonary/Chest: Effort normal and breath sounds normal.  Abdominal: Soft. Bowel sounds are normal. There is no tenderness.  Musculoskeletal: He exhibits no edema.  Neurological: He is alert and oriented to person, place, and time.  Skin: Skin is warm and dry.  Psychiatric: He has a normal mood and affect.  Nursing note and vitals reviewed.   BP 124/68 (BP Location: Left Arm, Patient Position: Sitting, Cuff Size: Large)   Pulse 64   Temp 97.6 F (36.4 C) (Oral)   Resp 16   Ht 5\' 9"  (1.753 m)   Wt 261 lb (118.4 kg)   SpO2 95%   BMI 38.54 kg/m  Wt Readings from Last 3 Encounters:  12/05/17 261 lb (118.4 kg)  11/29/17 262 lb (118.8 kg)  09/20/17 265 lb 3.2 oz (120.3 kg)     Lab Results  Component Value Date   WBC 6.1 02/09/2017   HGB 14.4 02/09/2017   HCT 42.5 02/09/2017   PLT 164.0 02/09/2017   GLUCOSE 133 (H) 02/09/2017   CHOL 134 02/09/2017   TRIG 137.0 02/09/2017   HDL 31.50 (L) 02/09/2017   LDLDIRECT 108.0 04/16/2014   LDLCALC 75 02/09/2017   ALT 23 02/09/2017   AST 19 02/09/2017   NA 137 02/09/2017  K 4.2 02/09/2017   CL 102 02/09/2017   CREATININE 0.84 02/09/2017   BUN 15 02/09/2017   CO2 28 02/09/2017   TSH 1.86 08/19/2017   PSA 1.24 07/24/2014   INR 1.02 05/17/2014   HGBA1C 6.3 (A) 11/29/2017   MICROALBUR 1.7 06/24/2015    Lab Results  Component Value Date   TSH 1.86 08/19/2017   Lab Results  Component Value Date   WBC 6.1  02/09/2017   HGB 14.4 02/09/2017   HCT 42.5 02/09/2017   MCV 94.4 02/09/2017   PLT 164.0 02/09/2017   Lab Results  Component Value Date   NA 137 02/09/2017   K 4.2 02/09/2017   CO2 28 02/09/2017   GLUCOSE 133 (H) 02/09/2017   BUN 15 02/09/2017   CREATININE 0.84 02/09/2017   BILITOT 0.7 02/09/2017   ALKPHOS 77 02/09/2017   AST 19 02/09/2017   ALT 23 02/09/2017   PROT 6.9 02/09/2017   ALBUMIN 3.9 02/09/2017   CALCIUM 9.0 02/09/2017   ANIONGAP 8 05/18/2014   GFR 95.30 02/09/2017   Lab Results  Component Value Date   CHOL 134 02/09/2017   Lab Results  Component Value Date   HDL 31.50 (L) 02/09/2017   Lab Results  Component Value Date   LDLCALC 75 02/09/2017   Lab Results  Component Value Date   TRIG 137.0 02/09/2017   Lab Results  Component Value Date   CHOLHDL 4 02/09/2017   Lab Results  Component Value Date   HGBA1C 6.3 (A) 11/29/2017       Assessment & Plan:   Problem List Items Addressed This Visit    Hypertension (Chronic)    Well controlled, no changes to meds. Encouraged heart healthy diet such as the DASH diet and exercise as tolerated.       Relevant Orders   CBC   Comprehensive metabolic panel   Hyperlipidemia    Tolerating statin, encouraged heart healthy diet, avoid trans fats, minimize simple carbs and saturated fats.       Relevant Orders   Lipid panel   Vitamin D deficiency    Supplement and monitor      Relevant Orders   VITAMIN D 25 Hydroxy (Vit-D Deficiency, Fractures)   Well controlled type 2 diabetes mellitus with peripheral neuropathy ([place])    hgba1c acceptable, minimize simple carbs. Increase exercise as tolerated. No erythema on base of toe.  Toes are warm      Peripheral neuropathy    Numbness in balls of feet, b/l large toes mostly on top of toes most notably. He has erythema on top of 2nd, 3rd, 4th toes bilaterally he reports it has been worsening over a number of years and is working with the [place] service  connection.          I am having Lanier Ensign. [place] maintain his aspirin EC, multivitamin with minerals, acetaminophen, nitroGLYCERIN, Polyethylene Glycol 3350 (MIRALAX PO), prasugrel, losartan, metoprolol tartrate, ACCU-CHEK FASTCLIX LANCETS, glucose blood, simvastatin, Insulin Syringe-Needle U-100, Probiotic Product (PROBIOTIC-10 PO), Cholecalciferol (VITAMIN D PO), glucagon, insulin NPH Human, and insulin regular.  No orders of the defined types were placed in this encounter.    Penni Homans, MD

## 2017-12-05 NOTE — Assessment & Plan Note (Signed)
Supplement and monitor 

## 2017-12-13 ENCOUNTER — Encounter: Payer: Self-pay | Admitting: Family Medicine

## 2018-01-05 ENCOUNTER — Encounter: Payer: Self-pay | Admitting: Podiatry

## 2018-01-05 ENCOUNTER — Ambulatory Visit: Payer: Medicare HMO | Admitting: Podiatry

## 2018-01-05 DIAGNOSIS — M79676 Pain in unspecified toe(s): Secondary | ICD-10-CM

## 2018-01-05 DIAGNOSIS — E1149 Type 2 diabetes mellitus with other diabetic neurological complication: Secondary | ICD-10-CM | POA: Diagnosis not present

## 2018-01-05 DIAGNOSIS — B351 Tinea unguium: Secondary | ICD-10-CM

## 2018-01-05 NOTE — Progress Notes (Signed)
Subjective: 72 y.o. returns the office today for painful, elongated, thickened toenails which he cannot trim himself. Denies any redness or drainage around the nails. His neuropathy is about the same as last appointment.  Denies any acute changes since last appointment and no new complaints today. Denies any systemic complaints such as fevers, chills, nausea, vomiting.   PCP: Mosie Lukes, MD  Endocrinologist: Dr. Benjiman Core, MD   Last A1c 6.3  Objective: AAO 3, NAD DP/PT pulses palpable, CRT less than 3 seconds Sensation is mildly decreased with Raymond Gurney monofilament to the left hallux as well as the right submetatarsal area.  There is decreased vibratory sensation of the right foot worse than left.  Subjectively is getting more sharp pains at nighttime. Nails hypertrophic, dystrophic, elongated, brittle, discolored 10. There is tenderness overlying the nails 1-5 bilaterally. There is no surrounding erythema or drainage along the nail sites. No open lesions or pre-ulcerative lesions are identified. No pain with calf compression, swelling, warmth, erythema.  Assessment: Patient presents with symptomatic onychomycosis; worsening neuropathy symptoms.  Plan: -Treatment options including alternatives, risks, complications were discussed -Nails sharply debrided 10 without complication/bleeding. -We will continue to monitor neuropathy symptoms.  -Discussed daily foot inspection. If there are any changes, to call the office immediately.  -Follow-up in 3 months or sooner if any problems are to arise. In the meantime, encouraged to call the office with any questions, concerns, changes symptoms.  Celesta Gentile, DPM

## 2018-01-16 DIAGNOSIS — G4733 Obstructive sleep apnea (adult) (pediatric): Secondary | ICD-10-CM | POA: Diagnosis not present

## 2018-01-18 ENCOUNTER — Encounter: Payer: Self-pay | Admitting: Cardiology

## 2018-01-18 NOTE — Progress Notes (Signed)
Cardiology Office Note   Date:  01/20/2018   ID:  Michael Cunningham, DOB 05-Jan-1946, MRN [id number]  PCP:  Mosie Lukes, MD  Cardiologist:   No primary care provider on file.   Chief Complaint  Patient presents with  . Edema      History of Present Illness: Michael Cunningham is a 72 y.o. male who presents for follow-up of his known coronary disease.  Is been a couple of years since he seeing me.  He gets seen at the [place].  He has had none of the symptoms that he had back when he had his stents.  He is not as physically active as I would like.  However, with his current level of activity he denies any cardiovascular symptoms such as chest pressure, neck or arm discomfort.  He has had no palpitations, presyncope or syncope.  He has had no weight gain.  He has some chronic lower extremity edema that might be slightly more than before and is better in the morning and it is in the evening.  He has not had to take any sublingual nitroglycerin.  Past Medical History:  Diagnosis Date  . CAD (coronary artery disease)    a. 05/2014 [place] s/p DES x4 to mid-distLAD and DESx1 to DI  . Complication of anesthesia    11'12 Surgery with excrutiating "head about to explode" upon awakening  -lasted several hours postop  ( SURGERY DONE 08-13-2011 AT [facility] PT DID OK POST OP)  . Diverticulosis of colon    LEFT SIDE  . Exposure to Agent [facility] 10/18/2016   [place]  . History of small bowel obstruction    X2  28 YRS AGO AND LAST ONE 04/2014--  RESOLVED WITHOUT SURGICAL INTERVENTION  . Hyperlipidemia   . Hypertension   . Knee pain, bilateral 08/13/2011  . Left ureteral calculus   . Multiple lipomas    hx. mulitple and some remains -arms, legs  . Obesity, unspecified 09/18/2013  . Obstructive sleep apnea 01/01/2016  . Recurrent kidney stones 07/08/2013   Follows with Dr Gaynelle Arabian He believes they are Calcium oxalate stones  . Thrombocytopenia ([place]) 07/07/2014  . Type 2 diabetes mellitus ([place])   . Vitamin D  deficiency 07/02/2014    Past Surgical History:  Procedure Laterality Date  . COLONOSCOPY W/ POLYPECTOMY  LAST ONE 2013  . CORONARY STENT PLACEMENT  05/17/2014   PROXIMAL X 4  MID & DISTAL LAD  DIAGONAL   . KNEE ARTHROSCOPY  08/13/2011   right ARTHROSCOPY KNEE;  Surgeon: Gearlean Alf, MD;  Location: [facility];  Service: Orthopedics;  Laterality: Right;  medial  meniscal debridement, excision of plica, synovectomy  . KNEE ARTHROSCOPY Left 11/16/2013   Procedure: LEFT KNEE ARTHROSCOPY MEDIAL MENISCAL DEBRIDEMENT, CHONDROPLASTY;  Surgeon: Gearlean Alf, MD;  Location: [facility];  Service: Orthopedics;  Laterality: Left;  . KNEE ARTHROSCOPY W/ MENISCECTOMY Right 01-29-2011   twice, first trimmed mediscus and cyst  . LEFT HEART CATHETERIZATION WITH CORONARY ANGIOGRAM N/A 05/17/2014   Procedure: LEFT HEART CATHETERIZATION WITH CORONARY ANGIOGRAM;  Surgeon: Burnell Blanks, MD;  Location[facility] CATH LAB;  Service: Cardiovascular;  Laterality: N/A;  . [place]   multiple, 10-12  . PERCUTANEOUS CORONARY STENT INTERVENTION (PCI-S)  05/17/2014   Procedure: PERCUTANEOUS CORONARY STENT INTERVENTION (PCI-S);  Surgeon: Burnell Blanks, MD;  Location: [facility] CATH LAB;  Service: Cardiovascular;;  Diag1 and Prox to Distal LAD     Current Outpatient  Medications  Medication Sig Dispense Refill  . ACCU-CHEK FASTCLIX LANCETS MISC Use to test blood sugar 2 times daily. Dx: E11.40 204 each 3  . acetaminophen (TYLENOL) 500 MG tablet Take 1,000 mg by mouth every 6 (six) hours as needed for mild pain. Reported on 03/31/2015    . aspirin EC 81 MG tablet Take 81 mg by mouth daily with breakfast.     . Cholecalciferol (VITAMIN D PO) Take 5,000 Units by mouth daily.    [name] glucagon (GLUCAGON EMERGENCY) 1 MG injection Inject 1 mg into the muscle once as needed for up to 1 dose. 1 each 12  . glucose blood (ACCU-CHEK AVIVA PLUS) test strip Use to test blood sugar 2 times daily. Dx: E11.40 200 each 3  . insulin  NPH Human (HUMULIN N) 100 UNIT/ML injection INJECT UNDER SKIN 20 UNITS IN THE AM AND 44 UNITS AT BEDTIME. 30 mL 3  . insulin regular (HUMULIN R) 100 units/mL injection Inject 0.2-0.35 mLs (20-35 Units total) into the skin 3 (three) times daily before meals. 30 mL 3  . Insulin Syringe-Needle U-100 (TRUEPLUS INSULIN SYRINGE) 30G X 5/16" 0.5 ML MISC USE TO INJECT INSULIN 5 TIMES DAILY. 200 each 3  . losartan (COZAAR) 50 MG tablet TAKE 1&1/2 (75 MG TOTAL) TABLETS BY MOUTH EVERY MORNING (Patient taking differently: Take 75 mg by mouth daily. TAKE 3 25MG  TABLETS BY MOUTH EVERY MORNING) 180 tablet 3  . metoprolol tartrate (LOPRESSOR) 25 MG tablet Take 1 tablet (25 mg total) by mouth 2 (two) times daily. 180 tablet 3  . Multiple Vitamin (MULTIVITAMIN WITH MINERALS) TABS tablet Take 1 tablet by mouth daily.    . nitroGLYCERIN (NITROSTAT) 0.4 MG SL tablet Place 1 tablet (0.4 mg total) under the tongue every 5 (five) minutes as needed for chest pain. 25 tablet 3  . Polyethylene Glycol 3350 (MIRALAX PO) Take by mouth as needed. Reported on 03/31/2015    . prasugrel (EFFIENT) 10 MG TABS tablet Take 1 tablet (10 mg total) by mouth daily. 90 tablet 3  . Probiotic Product (PROBIOTIC-10 PO) Take 1 tablet by mouth daily.    . simvastatin (ZOCOR) 40 MG tablet TAKE 1 TABLET (40 MG) BY MOUTH AT BEDTIME. 90 tablet 2   No current facility-administered medications for this visit.     Allergies:   Ciprofloxacin; Lipitor [atorvastatin calcium]; Lisinopril; and Metformin and related    ROS:  Please see the history of present illness.   Otherwise, review of systems are positive for none.   All other systems are reviewed and negative.    PHYSICAL EXAM: VS:  BP 124/66 (BP Location: Left Arm, Patient Position: Sitting, Cuff Size: Large)   Pulse (!) 55   Ht 5\' 9"  (1.753 m)   Wt 261 lb (118.4 kg)   BMI 38.54 kg/m  , BMI Body mass index is 38.54 kg/m. GENERAL:  Well appearing NECK:  No jugular venous distention, waveform  within normal limits, carotid upstroke brisk and symmetric, no bruits, no thyromegaly LUNGS:  Clear to auscultation bilaterally BACK:  No CVA tenderness CHEST:  Unremarkable HEART:  PMI not displaced or sustained,S1 and S2 within normal limits, no S3, no S4, no clicks, no rubs, no murmurs ABD:  Flat, positive bowel sounds normal in frequency in pitch, no bruits, no rebound, no guarding, no midline pulsatile mass, no hepatomegaly, no splenomegaly EXT:  2 plus pulses throughout, mild bilateral leg edema, no cyanosis no clubbing    EKG:  EKG is ordered today.  The ekg ordered today demonstrates sinus rhythm, right bundle branch block, rate 55, axis within normal limits, intervals within normal limits, no acute ST-T wave changes.  No change from previous   Recent Labs: 02/09/2017: ALT 23; BUN 15; Creatinine, Ser 0.84; Hemoglobin 14.4; Platelets 164.0; Potassium 4.2; Sodium 137 08/19/2017: TSH 1.86    Lipid Panel    Component Value Date/Time   CHOL 134 02/09/2017 0737   TRIG 137.0 02/09/2017 0737   HDL 31.50 (L) 02/09/2017 0737   CHOLHDL 4 02/09/2017 0737   VLDL 27.4 02/09/2017 0737   LDLCALC 75 02/09/2017 0737   LDLDIRECT 108.0 04/16/2014 1022      Wt Readings from Last 3 Encounters:  01/20/18 261 lb (118.4 kg)  12/05/17 261 lb (118.4 kg)  11/29/17 262 lb (118.8 kg)      Other studies Reviewed: Additional studies/ records that were reviewed today include: Labs. Review of the above records demonstrates:  Please see elsewhere in the note.     ASSESSMENT AND PLAN:   CAD/PCI/DES:  .   The patient has no new sypmtoms.  No further cardiovascular testing is indicated.  We will continue with aggressive risk reduction and meds as listed.  Per Dr. Angelena Form he should be on DAPT for life given the extent of stenting and complexity of the procedure.    HTN:  The blood pressure is at target. No change in medications is indicated. We will continue with therapeutic lifestyle changes  (TLC).  OVERWEIGHT:   Talked about increased physical activity and the Mediterranean diet.  SLEEP APNEA:   He would like to switch his sleep apnea physician to Dr. Elenore Rota and we will make this referral.  DYSLIPIDEMIA:  His LDL at the [place] was 75.  continue current therapy.   EDEMA:  I have suggested conservative therapy.  I will suggest compression stockings.     Current medicines are reviewed at length with the patient today.  The patient does not have concerns regarding medicines.  The following changes have been made:  no change  Labs/ tests ordered today include: None  Orders Placed This Encounter  Procedures  . EKG 12-Lead     Disposition:   FU with me in 18 months or so.      Signed, Minus Breeding, MD  01/20/2018 10:25 AM    [facility]

## 2018-01-20 ENCOUNTER — Ambulatory Visit: Payer: Medicare HMO | Admitting: Cardiology

## 2018-01-20 ENCOUNTER — Encounter: Payer: Self-pay | Admitting: Cardiology

## 2018-01-20 VITALS — BP 124/66 | HR 55 | Ht 69.0 in | Wt 261.0 lb

## 2018-01-20 DIAGNOSIS — G4730 Sleep apnea, unspecified: Secondary | ICD-10-CM | POA: Diagnosis not present

## 2018-01-20 DIAGNOSIS — I2510 Atherosclerotic heart disease of native coronary artery without angina pectoris: Secondary | ICD-10-CM | POA: Diagnosis not present

## 2018-01-20 DIAGNOSIS — E669 Obesity, unspecified: Secondary | ICD-10-CM | POA: Diagnosis not present

## 2018-01-20 DIAGNOSIS — E785 Hyperlipidemia, unspecified: Secondary | ICD-10-CM

## 2018-01-20 DIAGNOSIS — I10 Essential (primary) hypertension: Secondary | ICD-10-CM | POA: Diagnosis not present

## 2018-01-20 DIAGNOSIS — M7989 Other specified soft tissue disorders: Secondary | ICD-10-CM | POA: Insufficient documentation

## 2018-01-20 MED ORDER — NITROGLYCERIN 0.4 MG SL SUBL: 0.4000 mg | SUBLINGUAL_TABLET | SUBLINGUAL | 1 refills | Status: DC | PRN

## 2018-01-20 MED ORDER — PRASUGREL HCL 10 MG PO TABS: 10.0000 mg | ORAL_TABLET | Freq: Every day | ORAL | 3 refills | Status: AC

## 2018-01-20 MED ORDER — NITROGLYCERIN 0.4 MG SL SUBL: 0.4000 mg | SUBLINGUAL_TABLET | SUBLINGUAL | 3 refills | Status: AC | PRN

## 2018-01-20 NOTE — Patient Instructions (Signed)
Medication Instructions:  Continue current medications  If you need a refill on your cardiac medications before your next appointment, please call your pharmacy.  Labwork: None Ordered   If you have labs (blood work) drawn today and your tests are completely normal, you will receive your results only by: [name] MyChart Message (if you have MyChart) OR . A paper copy in the mail If you have any lab test that is abnormal or we need to change your treatment, we will call you to review the results.  Testing/Procedures: None Ordered  Follow-Up: You will need a follow up appointment in 18 Months.  Please call our office 2 months in [phone]) to schedule the (18 Months) appointment.  You may see  DR Percival Spanish,  or one of the following Advanced Practice Providers on your designated Care Team:    . Jory Sims, DNP, ANP . Rhonda Barrett, PA-C  . Kerin Ransom, [place]  . Almyra Deforest, PA-C . Fabian Sharp, PA-C  At [facility], you and your health needs are our priority.  As part of our continuing mission to provide you with exceptional heart care, we have created designated Provider Care Teams.  These Care Teams include your primary Cardiologist (physician) and Advanced Practice Providers (APPs -  Physician Assistants and Nurse Practitioners) who all work together to provide you with the care you need, when you need it.   Thank you for choosing [facility] at [facility]!!

## 2018-02-27 ENCOUNTER — Other Ambulatory Visit (INDEPENDENT_AMBULATORY_CARE_PROVIDER_SITE_OTHER): Payer: Medicare HMO

## 2018-02-27 DIAGNOSIS — I10 Essential (primary) hypertension: Secondary | ICD-10-CM | POA: Diagnosis not present

## 2018-02-27 DIAGNOSIS — E559 Vitamin D deficiency, unspecified: Secondary | ICD-10-CM

## 2018-02-27 DIAGNOSIS — E782 Mixed hyperlipidemia: Secondary | ICD-10-CM | POA: Diagnosis not present

## 2018-02-27 LAB — CBC
HCT: 43.3 % (ref 39.0–52.0)
HEMOGLOBIN: 14.6 g/dL (ref 13.0–17.0)
MCHC: 33.8 g/dL (ref 30.0–36.0)
MCV: 92.6 fl (ref 78.0–100.0)
Platelets: 148 10*3/uL — ABNORMAL LOW (ref 150.0–400.0)
RBC: 4.68 Mil/uL (ref 4.22–5.81)
RDW: 13.9 % (ref 11.5–15.5)
WBC: 5 10*3/uL (ref 4.0–10.5)

## 2018-02-27 LAB — COMPREHENSIVE METABOLIC PANEL
ALT: 20 U/L (ref 0–53)
AST: 18 U/L (ref 0–37)
Albumin: 4.1 g/dL (ref 3.5–5.2)
Alkaline Phosphatase: 83 U/L (ref 39–117)
BUN: 16 mg/dL (ref 6–23)
CHLORIDE: 102 meq/L (ref 96–112)
CO2: 29 meq/L (ref 19–32)
Calcium: 9.3 mg/dL (ref 8.4–10.5)
Creatinine, Ser: 0.89 mg/dL (ref 0.40–1.50)
GFR: 88.89 mL/min (ref >60.00)
Glucose, Bld: 150 mg/dL — ABNORMAL HIGH (ref 70–99)
Potassium: 5.1 mEq/L (ref 3.5–5.1)
Sodium: 139 mEq/L (ref 135–145)
Total Bilirubin: 0.9 mg/dL (ref 0.2–1.2)
Total Protein: 6.7 g/dL (ref 6.0–8.3)

## 2018-02-27 LAB — LIPID PANEL
CHOL/HDL RATIO: 4
Cholesterol: 123 mg/dL (ref 0–200)
HDL: 34.2 mg/dL — ABNORMAL LOW (ref >39.00)
LDL Cholesterol: 67 mg/dL (ref 0–99)
NonHDL: 88.71
Triglycerides: 109 mg/dL (ref 0.0–149.0)
VLDL: 21.8 mg/dL (ref 0.0–40.0)

## 2018-02-27 LAB — VITAMIN D 25 HYDROXY (VIT D DEFICIENCY, FRACTURES): VITD: 42.36 ng/mL (ref 30.00–100.00)

## 2018-03-03 ENCOUNTER — Encounter: Payer: Self-pay | Admitting: Family Medicine

## 2018-03-03 ENCOUNTER — Ambulatory Visit (INDEPENDENT_AMBULATORY_CARE_PROVIDER_SITE_OTHER): Payer: Medicare HMO | Admitting: Family Medicine

## 2018-03-03 VITALS — BP 122/62 | HR 59 | Temp 98.6°F | Resp 18 | Wt 258.4 lb

## 2018-03-03 DIAGNOSIS — E785 Hyperlipidemia, unspecified: Secondary | ICD-10-CM

## 2018-03-03 DIAGNOSIS — E669 Obesity, unspecified: Secondary | ICD-10-CM | POA: Diagnosis not present

## 2018-03-03 DIAGNOSIS — I10 Essential (primary) hypertension: Secondary | ICD-10-CM

## 2018-03-03 DIAGNOSIS — E782 Mixed hyperlipidemia: Secondary | ICD-10-CM

## 2018-03-03 DIAGNOSIS — L309 Dermatitis, unspecified: Secondary | ICD-10-CM

## 2018-03-03 DIAGNOSIS — E559 Vitamin D deficiency, unspecified: Secondary | ICD-10-CM

## 2018-03-03 DIAGNOSIS — E1142 Type 2 diabetes mellitus with diabetic polyneuropathy: Secondary | ICD-10-CM | POA: Diagnosis not present

## 2018-03-03 DIAGNOSIS — Z0000 Encounter for general adult medical examination without abnormal findings: Secondary | ICD-10-CM

## 2018-03-03 DIAGNOSIS — R3911 Hesitancy of micturition: Secondary | ICD-10-CM

## 2018-03-03 MED ORDER — TRIAMCINOLONE 0.1 % CREAM:EUCERIN CREAM 1:1: 1.0000 "application " | TOPICAL_CREAM | Freq: Two times a day (BID) | CUTANEOUS | 1 refills | Status: DC | PRN

## 2018-03-03 MED ORDER — TRIAMCINOLONE ACETONIDE 0.1 % EX CREA: 1.0000 "application " | TOPICAL_CREAM | Freq: Two times a day (BID) | CUTANEOUS | 0 refills | Status: DC

## 2018-03-03 MED FILL — CMP-TAC.1%CR/EUCERINCR 1:1: 30 days supply | Qty: 240 | Fill #0

## 2018-03-03 NOTE — Assessment & Plan Note (Signed)
Supplement and monitor 

## 2018-03-03 NOTE — Assessment & Plan Note (Signed)
Patient encouraged to maintain heart healthy diet, regular exercise, adequate sleep. Consider daily probiotics. Take medications as prescribed 

## 2018-03-03 NOTE — Assessment & Plan Note (Signed)
Encouraged heart healthy diet, increase exercise, avoid trans fats, consider a krill oil cap daily 

## 2018-03-03 NOTE — Assessment & Plan Note (Addendum)
Red, on chest and distal arms. Try Triamcinolone with Eucerin and if no relief may need to see dermatology again.

## 2018-03-03 NOTE — Assessment & Plan Note (Signed)
Encouraged DASH diet, decrease po intake and increase exercise as tolerated. Needs 7-8 hours of sleep nightly. Avoid trans fats, eat small, frequent meals every 4-5 hours with lean proteins, complex carbs and healthy fats. Minimize simple carbs 

## 2018-03-03 NOTE — Assessment & Plan Note (Signed)
hgba1c acceptable, minimize simple carbs. Increase exercise as tolerated. Continue current meds 

## 2018-03-03 NOTE — Assessment & Plan Note (Signed)
Tolerating statin, encouraged heart healthy diet, avoid trans fats, minimize simple carbs and saturated fats. Increase exercise as tolerated 

## 2018-03-03 NOTE — Assessment & Plan Note (Signed)
Well controlled, no changes to meds. Encouraged heart healthy diet such as the DASH diet and exercise as tolerated.  °

## 2018-03-03 NOTE — Patient Instructions (Signed)
Preventive Care 2 Years and Older, Male Preventive care refers to lifestyle choices and visits with your health care provider that can promote health and wellness. What does preventive care include?   A yearly physical exam. This is also called an annual well check.  Dental exams once or twice a year.  Routine eye exams. Ask your health care provider how often you should have your eyes checked.  Personal lifestyle choices, including: ? Daily care of your teeth and gums. ? Regular physical activity. ? Eating a healthy diet. ? Avoiding tobacco and drug use. ? Limiting alcohol use. ? Practicing safe sex. ? Taking low doses of aspirin every day. ? Taking vitamin and mineral supplements as recommended by your health care provider. What happens during an annual well check? The services and screenings done by your health care provider during your annual well check will depend on your age, overall health, lifestyle risk factors, and family history of disease. Counseling Your health care provider may ask you questions about your:  Alcohol use.  Tobacco use.  Drug use.  Emotional well-being.  Home and relationship well-being.  Sexual activity.  Eating habits.  History of falls.  Memory and ability to understand (cognition).  Work and work Statistician. Screening You may have the following tests or measurements:  Height, weight, and BMI.  Blood pressure.  Lipid and cholesterol levels. These may be checked every 5 years, or more frequently if you are over 9 years old.  Skin check.  Lung cancer screening. You may have this screening every year starting at age 57 if you have a 30-pack-year history of smoking and currently smoke or have quit within the past 15 years.  Colorectal cancer screening. All adults should have this screening starting at age 90 and continuing until age 69. You will have tests every 1-10 years, depending on your results and the type of screening  test. People at increased risk should start screening at an earlier age. Screening tests may include: ? Guaiac-based fecal occult blood testing. ? Fecal immunochemical test (FIT). ? Stool DNA test. ? Virtual colonoscopy. ? Sigmoidoscopy. During this test, a flexible tube with a tiny camera (sigmoidoscope) is used to examine your rectum and lower colon. The sigmoidoscope is inserted through your anus into your rectum and lower colon. ? Colonoscopy. During this test, a long, thin, flexible tube with a tiny camera (colonoscope) is used to examine your entire colon and rectum.  Prostate cancer screening. Recommendations will vary depending on your family history and other risks.  Hepatitis C blood test.  Hepatitis B blood test.  Sexually transmitted disease (STD) testing.  Diabetes screening. This is done by checking your blood sugar (glucose) after you have not eaten for a while (fasting). You may have this done every 1-3 years.  Abdominal aortic aneurysm (AAA) screening. You may need this if you are a current or former smoker.  Osteoporosis. You may be screened starting at age 30 if you are at high risk. Talk with your health care provider about your test results, treatment options, and if necessary, the need for more tests. Vaccines Your health care provider may recommend certain vaccines, such as:  Influenza vaccine. This is recommended every year.  Tetanus, diphtheria, and acellular pertussis (Tdap, Td) vaccine. You may need a Td booster every 10 years.  Varicella vaccine. You may need this if you have not been vaccinated.  Zoster vaccine. You may need this after age 42.  Measles, mumps, and rubella (MMR) vaccine.  You may need at least one dose of MMR if you were born in 1957 or later. You may also need a second dose.  Pneumococcal 13-valent conjugate (PCV13) vaccine. One dose is recommended after age 65.  Pneumococcal polysaccharide (PPSV23) vaccine. One dose is recommended  after age 65.  Meningococcal vaccine. You may need this if you have certain conditions.  Hepatitis A vaccine. You may need this if you have certain conditions or if you travel or work in places where you may be exposed to hepatitis A.  Hepatitis B vaccine. You may need this if you have certain conditions or if you travel or work in places where you may be exposed to hepatitis B.  Haemophilus influenzae type b (Hib) vaccine. You may need this if you have certain risk factors. Talk to your health care provider about which screenings and vaccines you need and how often you need them. This information is not intended to replace advice given to you by your health care provider. Make sure you discuss any questions you have with your health care provider. Document Released: 03/28/2015 Document Revised: 04/21/2017 Document Reviewed: 12/31/2014 Elsevier Interactive Patient Education  2019 Elsevier Inc.  

## 2018-03-06 NOTE — Progress Notes (Signed)
Subjective:    Patient ID: Michael Cunningham, male    DOB: Mar 28, 1945, 72 y.o.   MRN: [id number]  No chief complaint on file.   HPI Patient is in today for annual preventative exam and follow-up on chronic medical concerns.  He is accompanied by his wife.  He feels well today.  No recent febrile illness or hospitalizations.  He has had a flare in a maculopapular rash again.  It is noted on his chest and his arms.  He denies any new products.  He continues to struggle with peripheral neuropathy but it is stable.  Continues to follow with the [facility] in [place] as well.  He is trying to stay active and maintain a diabetic diet.  Has no difficulties with his activities of daily living. Denies CP/palp/SOB/HA/congestion/fevers/GI or GU c/o. Taking meds as prescribed  Past Medical History:  Diagnosis Date  . CAD (coronary artery disease)    a. 05/2014 [place] s/p DES x4 to mid-distLAD and DESx1 to DI  . Complication of anesthesia    11'12 Surgery with excrutiating "head about to explode" upon awakening  -lasted several hours postop  ( SURGERY DONE 08-13-2011 AT [facility] PT DID OK POST OP)  . Diverticulosis of colon    LEFT SIDE  . Exposure to Agent [facility] 10/18/2016   [place]  . History of small bowel obstruction    X2  28 YRS AGO AND LAST ONE 04/2014--  RESOLVED WITHOUT SURGICAL INTERVENTION  . Hyperlipidemia   . Hypertension   . Knee pain, bilateral 08/13/2011  . Left ureteral calculus   . Multiple lipomas    hx. mulitple and some remains -arms, legs  . Obesity, unspecified 09/18/2013  . Obstructive sleep apnea 01/01/2016  . Recurrent kidney stones 07/08/2013   Follows with Dr Gaynelle Arabian He believes they are Calcium oxalate stones  . Thrombocytopenia ([place]) 07/07/2014  . Type 2 diabetes mellitus ([place])   . Vitamin D deficiency 07/02/2014    Past Surgical History:  Procedure Laterality Date  . COLONOSCOPY W/ POLYPECTOMY  LAST ONE 2013  . CORONARY STENT PLACEMENT  05/17/2014   PROXIMAL X 4  MID & DISTAL LAD  DIAGONAL   . KNEE ARTHROSCOPY  08/13/2011   right ARTHROSCOPY KNEE;  Surgeon: Gearlean Alf, MD;  Location: [facility];  Service: Orthopedics;  Laterality: Right;  medial  meniscal debridement, excision of plica, synovectomy  . KNEE ARTHROSCOPY Left 11/16/2013   Procedure: LEFT KNEE ARTHROSCOPY MEDIAL MENISCAL DEBRIDEMENT, CHONDROPLASTY;  Surgeon: Gearlean Alf, MD;  Location: [facility];  Service: Orthopedics;  Laterality: Left;  . KNEE ARTHROSCOPY W/ MENISCECTOMY Right 01-29-2011   twice, first trimmed mediscus and cyst  . LEFT HEART CATHETERIZATION WITH CORONARY ANGIOGRAM N/A 05/17/2014   Procedure: LEFT HEART CATHETERIZATION WITH CORONARY ANGIOGRAM;  Surgeon: Burnell Blanks, MD;  Location: [facility] CATH LAB;  Service: Cardiovascular;  Laterality: N/A;  . [place]   multiple, 10-12  . PERCUTANEOUS CORONARY STENT INTERVENTION (PCI-S)  05/17/2014   Procedure: PERCUTANEOUS CORONARY STENT INTERVENTION (PCI-S);  Surgeon: Burnell Blanks, MD;  Location: [facility] CATH LAB;  Service: Cardiovascular;;  Diag1 and Prox to Distal LAD    Family History  Problem Relation Age of Onset  . Hypertension Mother   . Cancer Mother 11       ovarian  . Heart disease Father        CHF  . Heart attack Father 50  . Cancer Sister  breast- just finished her last radiation  . Heart disease Maternal Grandfather        ?  [name] Heart attack Paternal Grandfather 67  . Heart attack Paternal Uncle 58       Sudden death  . Diabetes Neg Hx     Social History   Socioeconomic History  . Marital status: Married    Spouse name: Not on file  . Number of children: 2  . Years of education: Not on file  . Highest education level: Not on file  Occupational History  . Not on file  Social Needs  . Financial resource strain: Not on file  . Food insecurity:    Worry: Not on file    Inability: Not on file  . Transportation needs:    Medical: Not on file    Non-medical: Not  on file  Tobacco Use  . Smoking status: Former Smoker    Packs/day: 1.00    Years: 4.00    Pack years: 4.00    Types: Cigarettes    Last attempt to quit: 06/14/1967    Years since quitting: 50.7  . Smokeless tobacco: Never Used  Substance and Sexual Activity  . Alcohol use: No    Alcohol/week: 0.0 standard drinks  . Drug use: No  . Sexual activity: Yes    Comment: lives with wife  Lifestyle  . Physical activity:    Days per week: Not on file    Minutes per session: Not on file  . Stress: Not on file  Relationships  . Social connections:    Talks on phone: Not on file    Gets together: Not on file    Attends religious service: Not on file    Active member of club or organization: Not on file    Attends meetings of clubs or organizations: Not on file    Relationship status: Not on file  . Intimate partner violence:    Fear of current or ex partner: Not on file    Emotionally abused: Not on file    Physically abused: Not on file    Forced sexual activity: Not on file  Other Topics Concern  . 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      Outpatient Medications Prior to Visit  Medication Sig Dispense Refill  . ACCU-CHEK FASTCLIX LANCETS MISC Use to test blood sugar 2 times daily. Dx: E11.40 204 each 3  . acetaminophen (TYLENOL) 500 MG tablet Take 1,000 mg by mouth every 6 (six) hours as needed for mild pain. Reported on 03/31/2015    . aspirin EC 81 MG tablet Take 81 mg by mouth daily with breakfast.     . Cholecalciferol (VITAMIN D PO) Take 5,000 Units by mouth daily.    [name] glucagon (GLUCAGON EMERGENCY) 1 MG injection Inject 1 mg into the muscle once as needed for up to 1 dose. 1 each 12  . glucose blood (ACCU-CHEK AVIVA PLUS) test strip Use to test blood sugar 2 times daily. Dx: E11.40 200 each 3  . insulin NPH Human (HUMULIN N) 100 UNIT/ML injection INJECT UNDER SKIN 20 UNITS IN THE AM AND 44 UNITS AT BEDTIME. 30 mL 3  .  insulin regular (HUMULIN R) 100 units/mL injection Inject 0.2-0.35 mLs (20-35 Units total) into the skin 3 (three) times daily before meals. 30 mL 3  . Insulin Syringe-Needle U-100 (TRUEPLUS INSULIN SYRINGE) 30G X 5/16" 0.5 ML MISC USE TO  INJECT INSULIN 5 TIMES DAILY. 200 each 3  . losartan (COZAAR) 50 MG tablet TAKE 1&1/2 (75 MG TOTAL) TABLETS BY MOUTH EVERY MORNING (Patient taking differently: Take 25 mg by mouth daily. TAKE 3 25MG  TABLETS BY MOUTH EVERY MORNING) 180 tablet 3  . metoprolol tartrate (LOPRESSOR) 25 MG tablet Take 1 tablet (25 mg total) by mouth 2 (two) times daily. 180 tablet 3  . Multiple Vitamin (MULTIVITAMIN WITH MINERALS) TABS tablet Take 1 tablet by mouth daily.    . nitroGLYCERIN (NITROSTAT) 0.4 MG SL tablet Place 1 tablet (0.4 mg total) under the tongue every 5 (five) minutes as needed for chest pain. 25 tablet 3  . Polyethylene Glycol 3350 (MIRALAX PO) Take by mouth as needed. Reported on 03/31/2015    . prasugrel (EFFIENT) 10 MG TABS tablet Take 1 tablet (10 mg total) by mouth daily. 90 tablet 3  . Probiotic Product (PROBIOTIC-10 PO) Take 1 tablet by mouth daily.    . simvastatin (ZOCOR) 40 MG tablet TAKE 1 TABLET (40 MG) BY MOUTH AT BEDTIME. 90 tablet 2   No facility-administered medications prior to visit.     Allergies  Allergen Reactions  . Ciprofloxacin Other (See Comments)    Abdominal pain  . Lipitor [Atorvastatin Calcium] Other (See Comments)    Severe muscle pain  . Lisinopril Cough  . Metformin And Related Diarrhea    Review of Systems  Constitutional: Negative for fever and malaise/fatigue.  HENT: Negative for congestion, ear discharge, ear pain, hearing loss and tinnitus.   Eyes: Negative for blurred vision.  Respiratory: Negative for shortness of breath.   Cardiovascular: Negative for chest pain, palpitations and leg swelling.  Gastrointestinal: Negative for abdominal pain, blood in stool and nausea.  Genitourinary: Negative for dysuria and  frequency.  Musculoskeletal: Negative for falls.  Skin: Positive for itching and rash.  Neurological: Negative for dizziness, loss of consciousness and headaches.  Endo/Heme/Allergies: Negative for environmental allergies.  Psychiatric/Behavioral: Negative for depression. The patient is not nervous/anxious.        Objective:    Physical Exam Vitals signs and nursing note reviewed.  Constitutional:      General: He is not in acute distress.    Appearance: He is well-developed.  HENT:     Head: Normocephalic and atraumatic.     Nose: Nose normal.  Eyes:     General:        Right eye: No discharge.        Left eye: No discharge.  Neck:     Musculoskeletal: Normal range of motion and neck supple.  Cardiovascular:     Rate and Rhythm: Normal rate and regular rhythm.     Heart sounds: No murmur.  Pulmonary:     Effort: Pulmonary effort is normal.     Breath sounds: Normal breath sounds.  Abdominal:     General: Bowel sounds are normal.     Palpations: Abdomen is soft.     Tenderness: There is no abdominal tenderness.  Skin:    General: Skin is warm and dry.     Findings: Rash present. No erythema.     Comments: Maculopapular erythematous rash on chest and lower arms.  Neurological:     Mental Status: He is alert and oriented to person, place, and time.     BP 122/62 (BP Location: Left Arm, Patient Position: Sitting, Cuff Size: Normal)   Pulse (!) 59   Temp 98.6 F (37 C) (Oral)   Resp 18  Wt 258 lb 6.4 oz (117.2 kg)   SpO2 98%   BMI 38.16 kg/m  Wt Readings from Last 3 Encounters:  03/03/18 258 lb 6.4 oz (117.2 kg)  01/20/18 261 lb (118.4 kg)  12/05/17 261 lb (118.4 kg)     Lab Results  Component Value Date   WBC 5.0 02/27/2018   HGB 14.6 02/27/2018   HCT 43.3 02/27/2018   PLT 148.0 (L) 02/27/2018   GLUCOSE 150 (H) 02/27/2018   CHOL 123 02/27/2018   TRIG 109.0 02/27/2018   HDL 34.20 (L) 02/27/2018   LDLDIRECT 108.0 04/16/2014   LDLCALC 67 02/27/2018    ALT 20 02/27/2018   AST 18 02/27/2018   NA 139 02/27/2018   K 5.1 02/27/2018   CL 102 02/27/2018   CREATININE 0.89 02/27/2018   BUN 16 02/27/2018   CO2 29 02/27/2018   TSH 1.86 08/19/2017   PSA 1.24 07/24/2014   INR 1.02 05/17/2014   HGBA1C 6.3 (A) 11/29/2017   MICROALBUR 1.7 06/24/2015    Lab Results  Component Value Date   TSH 1.86 08/19/2017   Lab Results  Component Value Date   WBC 5.0 02/27/2018   HGB 14.6 02/27/2018   HCT 43.3 02/27/2018   MCV 92.6 02/27/2018   PLT 148.0 (L) 02/27/2018   Lab Results  Component Value Date   NA 139 02/27/2018   K 5.1 02/27/2018   CO2 29 02/27/2018   GLUCOSE 150 (H) 02/27/2018   BUN 16 02/27/2018   CREATININE 0.89 02/27/2018   BILITOT 0.9 02/27/2018   ALKPHOS 83 02/27/2018   AST 18 02/27/2018   ALT 20 02/27/2018   PROT 6.7 02/27/2018   ALBUMIN 4.1 02/27/2018   CALCIUM 9.3 02/27/2018   ANIONGAP 8 05/18/2014   GFR 88.89 02/27/2018   Lab Results  Component Value Date   CHOL 123 02/27/2018   Lab Results  Component Value Date   HDL 34.20 (L) 02/27/2018   Lab Results  Component Value Date   LDLCALC 67 02/27/2018   Lab Results  Component Value Date   TRIG 109.0 02/27/2018   Lab Results  Component Value Date   CHOLHDL 4 02/27/2018   Lab Results  Component Value Date   HGBA1C 6.3 (A) 11/29/2017       Assessment & Plan:   Problem List Items Addressed This Visit    Hypertension (Chronic)    Well controlled, no changes to meds. Encouraged heart healthy diet such as the DASH diet and exercise as tolerated.       Relevant Orders   CBC   Comprehensive metabolic panel   TSH   Hyperlipidemia    Tolerating statin, encouraged heart healthy diet, avoid trans fats, minimize simple carbs and saturated fats. Increase exercise as tolerated      Relevant Orders   Lipid panel   Vitamin D deficiency    Supplement and monitor      Preventative health care    Patient encouraged to maintain heart healthy diet, regular  exercise, adequate sleep. Consider daily probiotics. Take medications as prescribed      Well controlled type 2 diabetes mellitus with peripheral neuropathy (HCC)    hgba1c acceptable, minimize simple carbs. Increase exercise as tolerated. Continue current meds      Relevant Orders   Hemoglobin A1c   Dyslipidemia    Encouraged heart healthy diet, increase exercise, avoid trans fats, consider a krill oil cap daily      Obesity (BMI 30-39.9)    Encouraged DASH  diet, decrease po intake and increase exercise as tolerated. Needs 7-8 hours of sleep nightly. Avoid trans fats, eat small, frequent meals every 4-5 hours with lean proteins, complex carbs and healthy fats. Minimize simple carbs      Dermatitis    Red, on chest and distal arms. Try Triamcinolone with Eucerin and if no relief may need to see dermatology again.        Other Visit Diagnoses    Urinary hesitancy    -  Primary   Relevant Orders   PSA      I am having Hobie M. Frank start on triamcinolone cream and triamcinolone 0.1 % cream : eucerin. I am also having him maintain his aspirin EC, multivitamin with minerals, acetaminophen, Polyethylene Glycol 3350 (MIRALAX PO), losartan, metoprolol tartrate, ACCU-CHEK FASTCLIX LANCETS, glucose blood, simvastatin, Insulin Syringe-Needle U-100, Probiotic Product (PROBIOTIC-10 PO), Cholecalciferol (VITAMIN D PO), glucagon, insulin NPH Human, insulin regular, prasugrel, and nitroGLYCERIN.  Meds ordered this encounter  Medications  . triamcinolone cream (KENALOG) 0.1 %    Sig: Apply 1 application topically 2 (two) times daily.    Dispense:  80 g    Refill:  0  . Triamcinolone Acetonide (TRIAMCINOLONE 0.1 % CREAM : EUCERIN) CREA    Sig: Apply 1 application topically 2 (two) times daily as needed for rash.    Dispense:  1 each    Refill:  1     Penni Homans, MD

## 2018-03-10 DIAGNOSIS — G4733 Obstructive sleep apnea (adult) (pediatric): Secondary | ICD-10-CM | POA: Diagnosis not present

## 2018-03-31 ENCOUNTER — Ambulatory Visit (INDEPENDENT_AMBULATORY_CARE_PROVIDER_SITE_OTHER): Payer: Medicare HMO | Admitting: Internal Medicine

## 2018-03-31 ENCOUNTER — Encounter: Payer: Self-pay | Admitting: Internal Medicine

## 2018-03-31 VITALS — BP 132/70 | HR 57 | Ht 69.0 in | Wt 263.0 lb

## 2018-03-31 DIAGNOSIS — E1142 Type 2 diabetes mellitus with diabetic polyneuropathy: Secondary | ICD-10-CM

## 2018-03-31 DIAGNOSIS — E669 Obesity, unspecified: Secondary | ICD-10-CM | POA: Diagnosis not present

## 2018-03-31 DIAGNOSIS — E782 Mixed hyperlipidemia: Secondary | ICD-10-CM | POA: Diagnosis not present

## 2018-03-31 LAB — POCT GLYCOSYLATED HEMOGLOBIN (HGB A1C): Hemoglobin A1C: 6.1 % — AB (ref 4.0–5.6)

## 2018-03-31 NOTE — Addendum Note (Signed)
Addended by: Cardell Peach I on: 03/31/2018 11:24 AM   Modules accepted: Orders

## 2018-03-31 NOTE — Progress Notes (Signed)
Patient ID: Michael Cunningham, male   DOB: 22-Jan-1946, 73 y.o.   MRN: [id number]  HPI: Michael Cunningham is a 73 y.o.-year-old male, returning for f/u DM2, dx in 1999, insulin-dependent since ~2008, controlled, with complications (CAD, peripheral neuropathy, DR). Last visit 4 months ago.  He is here with his wife offers part of the history especially regarding his diet and his sugars. He is 74% disabled >> highest priority group within his insurance.  Last hemoglobin A1c: Lab Results  Component Value Date   HGBA1C 6.3 (A) 11/29/2017   HGBA1C 7.0 05/24/2017   HGBA1C 7.6 (H) 02/09/2017  05/2016: HbA1c calculated from the fructosamine is better, at 5.9%. 10/01/2015: HbA1c calculated from fructosamine is still great, at 6.6%. 06/24/2015: HbA1c calculated from fructosamine is much lower, at 6.4%.  Pt is on a regimen of: Insulin Before breakfast Before lunch Before dinner Bedtime  Regular 20-25 25-30 30-35   NPH 20   44   Please do not use more than 20 units of R insulin before a meal prior to exercise (rehab, working in the yard, etc.).  He cannot afford analog insulin. He tried Metformin R and XR >> diarrhea He tried Januvia.  Pt checks his sugars 2 times a day: - am: 110-142, 162 >> 106-146 >> 88-152, 168 - 2h after b'fast: 163 >> 71-190, 246 >> 69, 141-177, 212 - before lunch:  68-77 >> 62-127, 154, 213 - 2h after lunch: n/c >> 76 >> 70 >> n/c  >> 55, 75 - before dinner: 76-121 >> 68-136, 244 >> 59-136, 161 - 2h after dinner: n/c >> 174 >> 111-132, 254 - bedtime:  115-122 >> 69-196 >> 66-152, 189 - nighttime:90 >> n/c Lowest sugar was 71 >> 76 >> 35 x1 >> 55; he has hypoglycemia awareness in the 90s. Highest sugar was 219 >> 193 >> 246 >> 254.  Glucometer: AccuChek [employer]iva >> Precision Extra by [employer] (given by [facility]).  Pt's meals are: - Breakfast: cheerios + skim milk + banana  - Lunch: sandwich or leftover  - Dinner: meat + veggies + starch - Snacks: PB crackers; wheat thins,  apple or grapes, pear   -No CKD, last BUN/creatinine:  Lab Results  Component Value Date   BUN 16 02/27/2018   CREATININE 0.89 02/27/2018  On losartan. -+ HL; last set of lipids: Lab Results  Component Value Date   CHOL 123 02/27/2018   HDL 34.20 (L) 02/27/2018   LDLCALC 67 02/27/2018   LDLDIRECT 108.0 04/16/2014   TRIG 109.0 02/27/2018   CHOLHDL 4 02/27/2018   On simvastatin. - last eye exam was in 10/2017.  He has a history of mild TR, but not at last check.  He also has angle-closure glaucoma and had surgery in both eyes.  Dr. Ellie Lunch. -Denies numbness and tingling in his L foot - [facility] (Dr Amalia Hailey).  He does have mild peripheral neuropathy in his right foot  He was dx'ed with Diabetic hand sd. (cheiroarthropathy) >> had steroid inj.  He also has a history of HTN. He has vitamin D def >> was on Ergocalciferol >> now on 5000 units vitamin D3 daily  ROS: Constitutional: no weight gain/no weight loss, no fatigue, no subjective hyperthermia, no subjective hypothermia Eyes: no blurry vision, no xerophthalmia ENT: no sore throat, no nodules palpated in neck, no dysphagia, no odynophagia, no hoarseness Cardiovascular: no CP/no SOB/no palpitations/no leg swelling Respiratory: no cough/no SOB/no wheezing Gastrointestinal: no N/no V/no D/no C/no acid reflux Musculoskeletal: no muscle  aches/no joint aches Skin: no rashes, no hair loss Neurological: no tremors/no numbness/no tingling/no dizziness  I reviewed pt's medications, allergies, PMH, social hx, family hx, and changes were documented in the history of present illness. Otherwise, unchanged from my initial visit note.  Past Medical History:  Diagnosis Date  . CAD (coronary artery disease)    a. 05/2014 [place] s/p DES x4 to mid-distLAD and DESx1 to DI  . Complication of anesthesia    11'12 Surgery with excrutiating "head about to explode" upon awakening  -lasted several hours postop  ( SURGERY DONE 08-13-2011 AT [facility]  PT DID OK POST OP)  . Diverticulosis of colon    LEFT SIDE  . Exposure to [facility] 10/18/2016   [place]  . History of small bowel obstruction    X2  28 YRS AGO AND LAST ONE 04/2014--  RESOLVED WITHOUT SURGICAL INTERVENTION  . Hyperlipidemia   . Hypertension   . Knee pain, bilateral 08/13/2011  . Left ureteral calculus   . Multiple lipomas    hx. mulitple and some remains -arms, legs  . Obesity, unspecified 09/18/2013  . Obstructive sleep apnea 01/01/2016  . Recurrent kidney stones 07/08/2013   Follows with Dr Gaynelle Arabian He believes they are Calcium oxalate stones  . Thrombocytopenia ([place]) 07/07/2014  . Type 2 diabetes mellitus ([place])   . Vitamin D deficiency 07/02/2014   Past Surgical History:  Procedure Laterality Date  . COLONOSCOPY W/ POLYPECTOMY  LAST ONE 2013  . CORONARY STENT PLACEMENT  05/17/2014   PROXIMAL X 4  MID & DISTAL LAD  DIAGONAL   . KNEE ARTHROSCOPY  08/13/2011   right ARTHROSCOPY KNEE;  Surgeon: Gearlean Alf, MD;  Location: [facility];  Service: Orthopedics;  Laterality: Right;  medial  meniscal debridement, excision of plica, synovectomy  . KNEE ARTHROSCOPY Left 11/16/2013   Procedure: LEFT KNEE ARTHROSCOPY MEDIAL MENISCAL DEBRIDEMENT, CHONDROPLASTY;  Surgeon: Gearlean Alf, MD;  Location: [facility];  Service: Orthopedics;  Laterality: Left;  . KNEE ARTHROSCOPY W/ MENISCECTOMY Right 01-29-2011   twice, first trimmed mediscus and cyst  . LEFT HEART CATHETERIZATION WITH CORONARY ANGIOGRAM N/A 05/17/2014   Procedure: LEFT HEART CATHETERIZATION WITH CORONARY ANGIOGRAM;  Surgeon: Burnell Blanks, MD;  Location: [facility] CATH LAB;  Service: Cardiovascular;  Laterality: N/A;  . [place]   multiple, 10-12  . PERCUTANEOUS CORONARY STENT INTERVENTION (PCI-S)  05/17/2014   Procedure: PERCUTANEOUS CORONARY STENT INTERVENTION (PCI-S);  Surgeon: Burnell Blanks, MD;  Location: [facility] CATH LAB;  Service: Cardiovascular;;  Diag1 and Prox to Distal LAD   History   Social  History  . Marital Status: Married    Spouse Name: N/A  . Number of Children: 2   Social History Main Topics  . Smoking status: Former Smoker -- 1.00 packs/day for 4 years    Types: Cigarettes    Quit date: 06/14/1967  . Smokeless tobacco: Never Used  . Alcohol Use: No  . Drug Use: No  . 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     Current Outpatient Medications on File Prior to Visit  Medication Sig Dispense Refill  . ACCU-CHEK FASTCLIX LANCETS MISC Use to test blood sugar 2 times daily. Dx: E11.40 204 each 3  . acetaminophen (TYLENOL) 500 MG tablet Take 1,000 mg by mouth every 6 (six) hours as needed for mild pain. Reported on 03/31/2015    .  aspirin EC 81 MG tablet Take 81 mg by mouth daily with breakfast.     . Cholecalciferol (VITAMIN D PO) Take 5,000 Units by mouth daily.    [name] glucagon (GLUCAGON EMERGENCY) 1 MG injection Inject 1 mg into the muscle once as needed for up to 1 dose. 1 each 12  . glucose blood (ACCU-CHEK AVIVA PLUS) test strip Use to test blood sugar 2 times daily. Dx: E11.40 200 each 3  . insulin NPH Human (HUMULIN N) 100 UNIT/ML injection INJECT UNDER SKIN 20 UNITS IN THE AM AND 44 UNITS AT BEDTIME. 30 mL 3  . insulin regular (HUMULIN R) 100 units/mL injection Inject 0.2-0.35 mLs (20-35 Units total) into the skin 3 (three) times daily before meals. 30 mL 3  . Insulin Syringe-Needle U-100 (TRUEPLUS INSULIN SYRINGE) 30G X 5/16" 0.5 ML MISC USE TO INJECT INSULIN 5 TIMES DAILY. 200 each 3  . losartan (COZAAR) 50 MG tablet TAKE 1&1/2 (75 MG TOTAL) TABLETS BY MOUTH EVERY MORNING (Patient taking differently: Take 25 mg by mouth daily. TAKE 3 25MG  TABLETS BY MOUTH EVERY MORNING) 180 tablet 3  . metoprolol tartrate (LOPRESSOR) 25 MG tablet Take 1 tablet (25 mg total) by mouth 2 (two) times daily. 180 tablet 3  . Multiple Vitamin (MULTIVITAMIN WITH MINERALS) TABS tablet Take 1 tablet by  mouth daily.    . nitroGLYCERIN (NITROSTAT) 0.4 MG SL tablet Place 1 tablet (0.4 mg total) under the tongue every 5 (five) minutes as needed for chest pain. 25 tablet 3  . Polyethylene Glycol 3350 (MIRALAX PO) Take by mouth as needed. Reported on 03/31/2015    . prasugrel (EFFIENT) 10 MG TABS tablet Take 1 tablet (10 mg total) by mouth daily. 90 tablet 3  . Probiotic Product (PROBIOTIC-10 PO) Take 1 tablet by mouth daily.    . simvastatin (ZOCOR) 40 MG tablet TAKE 1 TABLET (40 MG) BY MOUTH AT BEDTIME. 90 tablet 2  . Triamcinolone Acetonide (TRIAMCINOLONE 0.1 % CREAM : EUCERIN) CREA Apply 1 application topically 2 (two) times daily as needed for rash. 1 each 1  . triamcinolone cream (KENALOG) 0.1 % Apply 1 application topically 2 (two) times daily. 80 g 0   No current facility-administered medications on file prior to visit.    Allergies  Allergen Reactions  . Ciprofloxacin Other (See Comments)    Abdominal pain  . Lipitor [Atorvastatin Calcium] Other (See Comments)    Severe muscle pain  . Lisinopril Cough  . Metformin And Related Diarrhea   Family History  Problem Relation Age of Onset  . Hypertension Mother   . Cancer Mother 69       ovarian  . Heart disease Father        CHF  . Heart attack Father 61  . Cancer Sister        breast- just finished her last radiation  . Heart disease Maternal Grandfather        ?  [name] Heart attack Paternal Grandfather 57  . Heart attack Paternal Uncle 31       Sudden death  . Diabetes Neg Hx    PE: BP 132/70   Pulse (!) 57   Ht 5\' 9"  (1.753 m) Comment: measured  Wt 263 lb (119.3 kg)   SpO2 97%   BMI 38.84 kg/m  Body mass index is 38.84 kg/m.  Wt Readings from Last 3 Encounters:  03/31/18 263 lb (119.3 kg)  03/03/18 258 lb 6.4 oz (117.2 kg)  01/20/18 261 lb (118.4  kg)   Constitutional: overweight, in NAD Eyes: PERRLA, EOMI, no exophthalmos ENT: moist mucous membranes, no thyromegaly, no cervical lymphadenopathy Cardiovascular: RRR,  No MRG Respiratory: CTA B Gastrointestinal: abdomen soft, NT, ND, BS+ Musculoskeletal: no deformities, strength intact in all 4 Skin: moist, warm, no rashes Neurological: no tremor with outstretched hands, DTR normal in all 4  ASSESSMENT: 1. DM2, insulin-dependent, now better controlled, with complications - CAD, status post stent 05/2014 - Dr Percival Spanish - Peripheral neuropathy - DR  2. HL  PLAN:  1. Patient with longstanding, fairly well-controlled diabetes, with low blood sugars at last visit, in the 60s and 70s and even had one severe low at 35 - was very active that day, but did not reduce the insulin dose, as discussed.  They were happening in the middle of the day, but also in the evening.  We reduced the doses of both NPH and regular insulin and we discussed about not taking more than 20 units of regular insulin if he plans to be more active after a meal.   -At this visit sugars are mostly at goal, with few exceptions due to dietary indiscretions. -However, he also has some low blood sugars in the 50s and 60s so at this visit we discussed about reducing his regular insulin doses and also his a.m. dose of NPH more. -We also discussed about diet and I suggested improvement by adding greens with every meal and not snacking between meals.  I also suggested to eliminate milk from breakfast but if he does eat cereals to add healthy fats in terms of nuts, Chia seeds, or flaxseeds - I advised him to: Patient Instructions   Please change Insulin Before breakfast Before lunch Before dinner Bedtime  Regular 15-20 20-25 25-30   NPH 15   44   Please do not use more than 15 units of R insulin before a meal prior to exercise (rehab, working in the yard, etc.).  Please return in 3-4 months.   - today, HbA1c is 6.1% (improved).  In the past, we were checking fructosamine for him as these levels are more accurate.  However, I do not feel we need to check a fructosamine level today. -  continue checking sugars at different times of the day - check 3x a day, rotating checks - advised for yearly eye exams >> he is UTD - Return to clinic in 3-4 mo with sugar log    2. HL - Reviewed latest lipid panel from 02/2018: LDL excellent, improved, HDL slightly low, triglycerides normal Lab Results  Component Value Date   CHOL 123 02/27/2018   HDL 34.20 (L) 02/27/2018   LDLCALC 67 02/27/2018   LDLDIRECT 108.0 04/16/2014   TRIG 109.0 02/27/2018   CHOLHDL 4 02/27/2018  - Continues Zocor without side effects.  3.  Obesity -We discussed about the need to lose weight.  I suggested changes in diet.  Reducing the insulin doses will also help.   Philemon Kingdom, MD PhD [facility]

## 2018-03-31 NOTE — Patient Instructions (Addendum)
Please change Insulin Before breakfast Before lunch Before dinner Bedtime  Regular 15-20 20-25 25-30   NPH 15   44   Please do not use more than 15 units of R insulin before a meal prior to exercise (rehab, working in the yard, etc.).  Please return in 3-4 months.

## 2018-04-06 ENCOUNTER — Encounter: Payer: Self-pay | Admitting: Podiatry

## 2018-04-06 ENCOUNTER — Ambulatory Visit: Payer: Medicare HMO | Admitting: Podiatry

## 2018-04-06 DIAGNOSIS — E1149 Type 2 diabetes mellitus with other diabetic neurological complication: Secondary | ICD-10-CM | POA: Diagnosis not present

## 2018-04-06 DIAGNOSIS — M205X9 Other deformities of toe(s) (acquired), unspecified foot: Secondary | ICD-10-CM

## 2018-04-06 DIAGNOSIS — M79676 Pain in unspecified toe(s): Secondary | ICD-10-CM | POA: Diagnosis not present

## 2018-04-06 DIAGNOSIS — L84 Corns and callosities: Secondary | ICD-10-CM

## 2018-04-06 DIAGNOSIS — B351 Tinea unguium: Secondary | ICD-10-CM

## 2018-04-06 NOTE — Progress Notes (Signed)
Subjective: 73 y.o. returns the office today for painful, elongated, thickened toenails which he cannot trim himself. Denies any redness or drainage around the nails. His neuropathy is about the same as last appointment and has not changed.  Denies any acute changes since last appointment and no new complaints today. Denies any systemic complaints such as fevers, chills, nausea, vomiting.   PCP: Mosie Lukes, MD  Endocrinologist: Dr. Benjiman Core, MD   Last A1c 6.1 (03/31/2018)  Objective: AAO 3, NAD DP/PT pulses palpable, CRT less than 3 seconds Sensation is mildly decreased with SWM but overall no change. Nails hypertrophic, dystrophic, elongated, brittle, discolored 10. There is tenderness overlying the nails 1-5 bilaterally. There is no surrounding erythema or drainage along the nail sites. Adductovarus of the 5th toes are present. There is a pre-ulcerative corn on the left 5th toe. I debrided this area and there was no definitve open sore.  No open lesions or pre-ulcerative lesions are identified. No pain with calf compression, swelling, warmth, erythema.  Assessment: Patient presents with symptomatic onychomycosis; pre-ulcerative area left 5th toe  Plan: -Treatment options including alternatives, risks, complications were discussed -Nails sharply debrided 10 without complication/bleeding. -I lightly debrided the left 5th toe pre-ulcerative callus without any complications or bleeding. Offloading at all times. Monitor for any skin breakdown.  -We will continue to monitor neuropathy symptoms.  -Discussed daily foot inspection. If there are any changes, to call the office immediately.  -Follow-up in 3 months or sooner if any problems are to arise. In the meantime, encouraged to call the office with any questions, concerns, changes symptoms.  Celesta Gentile, DPM

## 2018-06-26 ENCOUNTER — Encounter: Payer: Self-pay | Admitting: Family Medicine

## 2018-07-04 ENCOUNTER — Other Ambulatory Visit: Payer: Medicare HMO

## 2018-07-06 ENCOUNTER — Ambulatory Visit: Payer: Medicare HMO | Admitting: Podiatry

## 2018-07-06 ENCOUNTER — Encounter: Payer: Self-pay | Admitting: Family Medicine

## 2018-07-07 ENCOUNTER — Ambulatory Visit (INDEPENDENT_AMBULATORY_CARE_PROVIDER_SITE_OTHER): Payer: Medicare HMO | Admitting: Family Medicine

## 2018-07-07 ENCOUNTER — Other Ambulatory Visit: Payer: Self-pay

## 2018-07-07 DIAGNOSIS — E559 Vitamin D deficiency, unspecified: Secondary | ICD-10-CM | POA: Diagnosis not present

## 2018-07-07 DIAGNOSIS — E785 Hyperlipidemia, unspecified: Secondary | ICD-10-CM | POA: Diagnosis not present

## 2018-07-07 DIAGNOSIS — I10 Essential (primary) hypertension: Secondary | ICD-10-CM | POA: Diagnosis not present

## 2018-07-07 DIAGNOSIS — E1142 Type 2 diabetes mellitus with diabetic polyneuropathy: Secondary | ICD-10-CM | POA: Diagnosis not present

## 2018-07-07 DIAGNOSIS — E782 Mixed hyperlipidemia: Secondary | ICD-10-CM

## 2018-07-07 NOTE — Assessment & Plan Note (Signed)
Encouraged heart healthy diet, increase exercise, avoid trans fats, consider a krill oil cap daily 

## 2018-07-07 NOTE — Assessment & Plan Note (Signed)
Well controlled, no changes to meds. Encouraged heart healthy diet such as the DASH diet and exercise as tolerated.  °

## 2018-07-09 NOTE — Assessment & Plan Note (Signed)
Tolerating statin, encouraged heart healthy diet, avoid trans fats, minimize simple carbs and saturated fats. Increase exercise as tolerated 

## 2018-07-09 NOTE — Assessment & Plan Note (Signed)
hgba1c acceptable, minimize simple carbs. Increase exercise as tolerated. Continue current meds. Had labs at [facility] in January and he reports his sugars have been good and his fingerstick sugars running 80 to 110

## 2018-07-09 NOTE — Progress Notes (Signed)
Virtual Visit via Video Note  I connected with Michael Cunningham on 07/09/18 at  8:40 AM EDT by a video enabled telemedicine application and verified that I am speaking with the correct person using two identifiers.   I discussed the limitations of evaluation and management by telemedicine and the availability of in person appointments. The patient expressed understanding and agreed to proceed. Magdalene Molly, CMA was able to get patient set up on video platfrom for visit and he is accompanied by his wife from his home    Subjective:    Patient ID: Michael Cunningham, male    DOB: 02-Apr-1945, 73 y.o.   MRN: [id number]  No chief complaint on file.   HPI Patient is in today for follow up on chronic medical concerns including diabetes, hypertension, hyperlipidemia and obesity. He feels well today although he does note some stress related to current quarantine status. No recent febrile illness or hospitalizations. He had labs done at [place] in January and reports his sugar was good, his FSG ranging 80 to 110. No polyuria or polydipsia. Denies CP/palp/SOB/HA/congestion/fevers/GI or GU c/o. Taking meds as prescribed  Past Medical History:  Diagnosis Date  . CAD (coronary artery disease)    a. 05/2014 [place] s/p DES x4 to mid-distLAD and DESx1 to DI  . Complication of anesthesia    11'12 Surgery with excrutiating "head about to explode" upon awakening  -lasted several hours postop  ( SURGERY DONE 08-13-2011 AT [facility] PT DID OK POST OP)  . Diverticulosis of colon    LEFT SIDE  . Exposure to Agent [facility] 10/18/2016   [place]  . History of small bowel obstruction    X2  28 YRS AGO AND LAST ONE 04/2014--  RESOLVED WITHOUT SURGICAL INTERVENTION  . Hyperlipidemia   . Hypertension   . Knee pain, bilateral 08/13/2011  . Left ureteral calculus   . Multiple lipomas    hx. mulitple and some remains -arms, legs  . Obesity, unspecified 09/18/2013  . Obstructive sleep apnea 01/01/2016  . Recurrent kidney stones 07/08/2013   Follows with Dr Gaynelle Arabian He believes they are Calcium oxalate stones  . Thrombocytopenia ([place]) 07/07/2014  . Type 2 diabetes mellitus ([place])   . Vitamin D deficiency 07/02/2014    Past Surgical History:  Procedure Laterality Date  . COLONOSCOPY W/ POLYPECTOMY  LAST ONE 2013  . CORONARY STENT PLACEMENT  05/17/2014   PROXIMAL X 4  MID & DISTAL LAD  DIAGONAL   . KNEE ARTHROSCOPY  08/13/2011   right ARTHROSCOPY KNEE;  Surgeon: Gearlean Alf, MD;  Location: [facility];  Service: Orthopedics;  Laterality: Right;  medial  meniscal debridement, excision of plica, synovectomy  . KNEE ARTHROSCOPY Left 11/16/2013   Procedure: LEFT KNEE ARTHROSCOPY MEDIAL MENISCAL DEBRIDEMENT, CHONDROPLASTY;  Surgeon: Gearlean Alf, MD;  Location: [facility];  Service: Orthopedics;  Laterality: Left;  . KNEE ARTHROSCOPY W/ MENISCECTOMY Right 01-29-2011   twice, first trimmed mediscus and cyst  . LEFT HEART CATHETERIZATION WITH CORONARY ANGIOGRAM N/A 05/17/2014   Procedure: LEFT HEART CATHETERIZATION WITH CORONARY ANGIOGRAM;  Surgeon: Burnell Blanks, MD;  Location[facility] C[facility] LAB;  Service: Cardiovascular;  Laterality: N/A;  . [place]   multiple, 10-12  . PERCUTANEOUS CORONARY STENT INTERVENTION (PCI-S)  05/17/2014   Procedure: PERCUTANEOUS CORONARY STENT INTERVENTION (PCI-S);  Surgeon: Burnell Blanks, MD;  Location: [facility] CATH LAB;  Service: Cardiovascular;;  Diag1 and Prox to Distal LAD    Family History  Problem Relation Age  of Onset  . Hypertension Mother   . Cancer Mother 33       ovarian  . Heart disease Father        CHF  . Heart attack Father 53  . Cancer Sister        breast- just finished her last radiation  . Heart disease Maternal Grandfather        ?  [name] Heart attack Paternal Grandfather 48  . Heart attack Paternal Uncle 45       Sudden death  . Diabetes Neg Hx     Social History   Socioeconomic History  . Marital status: Married    Spouse name: Not on file  . Number of  children: 2  . Years of education: Not on file  . Highest education level: Not on file  Occupational History  . Not on file  Social Needs  . Financial resource strain: Not on file  . Food insecurity:    Worry: Not on file    Inability: Not on file  . Transportation needs:    Medical: Not on file    Non-medical: Not on file  Tobacco Use  . Smoking status: Former Smoker    Packs/day: 1.00    Years: 4.00    Pack years: 4.00    Types: Cigarettes    Last attempt to quit: 06/14/1967    Years since quitting: 51.1  . Smokeless tobacco: Never Used  Substance and Sexual Activity  . Alcohol use: No    Alcohol/week: 0.0 standard drinks  . Drug use: No  . Sexual activity: Yes    Comment: lives with wife  Lifestyle  . Physical activity:    Days per week: Not on file    Minutes per session: Not on file  . Stress: Not on file  Relationships  . Social connections:    Talks on phone: Not on file    Gets together: Not on file    Attends religious service: Not on file    Active member of club or organization: Not on file    Attends meetings of clubs or organizations: Not on file    Relationship status: Not on file  . Intimate partner violence:    Fear of current or ex partner: Not on file    Emotionally abused: Not on file    Physically abused: Not on file    Forced sexual activity: Not on file  Other Topics Concern  . 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      Outpatient Medications Prior to Visit  Medication Sig Dispense Refill  . ACCU-CHEK FASTCLIX LANCETS MISC Use to test blood sugar 2 times daily. Dx: E11.40 204 each 3  . acetaminophen (TYLENOL) 500 MG tablet Take 1,000 mg by mouth every 6 (six) hours as needed for mild pain. Reported on 03/31/2015    . aspirin EC 81 MG tablet Take 81 mg by mouth daily with breakfast.     . Cholecalciferol (VITAMIN D PO) Take 5,000 Units by mouth daily.    [name] glucagon (GLUCAGON EMERGENCY) 1  MG injection Inject 1 mg into the muscle once as needed for up to 1 dose. 1 each 12  . glucose blood (ACCU-CHEK AVIVA PLUS) test strip Use to test blood sugar 2 times daily. Dx: E11.40 200 each 3  . insulin NPH Human (HUMULIN N) 100 UNIT/ML injection INJECT UNDER SKIN 20 UNITS IN THE AM AND 44  UNITS AT BEDTIME. 30 mL 3  . insulin regular (HUMULIN R) 100 units/mL injection Inject 0.2-0.35 mLs (20-35 Units total) into the skin 3 (three) times daily before meals. 30 mL 3  . Insulin Syringe-Needle U-100 (TRUEPLUS INSULIN SYRINGE) 30G X 5/16" 0.5 ML MISC USE TO INJECT INSULIN 5 TIMES DAILY. 200 each 3  . losartan (COZAAR) 50 MG tablet TAKE 1&1/2 (75 MG TOTAL) TABLETS BY MOUTH EVERY MORNING (Patient taking differently: Take 25 mg by mouth daily. TAKE 3 25MG  TABLETS BY MOUTH EVERY MORNING) 180 tablet 3  . metoprolol tartrate (LOPRESSOR) 25 MG tablet Take 1 tablet (25 mg total) by mouth 2 (two) times daily. 180 tablet 3  . Multiple Vitamin (MULTIVITAMIN WITH MINERALS) TABS tablet Take 1 tablet by mouth daily.    . nitroGLYCERIN (NITROSTAT) 0.4 MG SL tablet Place 1 tablet (0.4 mg total) under the tongue every 5 (five) minutes as needed for chest pain. 25 tablet 3  . Polyethylene Glycol 3350 (MIRALAX PO) Take by mouth as needed. Reported on 03/31/2015    . prasugrel (EFFIENT) 10 MG TABS tablet Take 1 tablet (10 mg total) by mouth daily. 90 tablet 3  . Probiotic Product (PROBIOTIC-10 PO) Take 1 tablet by mouth daily.    . simvastatin (ZOCOR) 40 MG tablet TAKE 1 TABLET (40 MG) BY MOUTH AT BEDTIME. 90 tablet 2  . Triamcinolone Acetonide (TRIAMCINOLONE 0.1 % CREAM : EUCERIN) CREA Apply 1 application topically 2 (two) times daily as needed for rash. 1 each 1  . triamcinolone cream (KENALOG) 0.1 % Apply 1 application topically 2 (two) times daily. 80 g 0   No facility-administered medications prior to visit.     Allergies  Allergen Reactions  . Ciprofloxacin Other (See Comments)    Abdominal pain  . Lipitor  [Atorvastatin Calcium] Other (See Comments)    Severe muscle pain  . Lisinopril Cough  . Metformin And Related Diarrhea    Review of Systems  Constitutional: Negative for fever and malaise/fatigue.  HENT: Negative for congestion.   Eyes: Negative for blurred vision.  Respiratory: Negative for shortness of breath.   Cardiovascular: Negative for chest pain, palpitations and leg swelling.  Gastrointestinal: Negative for abdominal pain, blood in stool and nausea.  Genitourinary: Negative for dysuria and frequency.  Musculoskeletal: Negative for falls.  Skin: Negative for rash.  Neurological: Negative for dizziness, loss of consciousness and headaches.  Endo/Heme/Allergies: Negative for environmental allergies.  Psychiatric/Behavioral: Negative for depression. The patient is not nervous/anxious.        Objective:    Physical Exam Constitutional:      Appearance: Normal appearance. He is obese. He is not ill-appearing.  HENT:     Head: Normocephalic and atraumatic.     Nose: Nose normal.  Pulmonary:     Effort: Pulmonary effort is normal.  Neurological:     General: No focal deficit present.     Mental Status: He is alert and oriented to person, place, and time.  Psychiatric:        Mood and Affect: Mood normal.     There were no vitals taken for this visit. Wt Readings from Last 3 Encounters:  03/31/18 263 lb (119.3 kg)  03/03/18 258 lb 6.4 oz (117.2 kg)  01/20/18 261 lb (118.4 kg)    Diabetic Foot Exam - Simple   No data filed     Lab Results  Component Value Date   WBC 5.0 02/27/2018   HGB 14.6 02/27/2018   HCT 43.3 02/27/2018  PLT 148.0 (L) 02/27/2018   GLUCOSE 150 (H) 02/27/2018   CHOL 123 02/27/2018   TRIG 109.0 02/27/2018   HDL 34.20 (L) 02/27/2018   LDLDIRECT 108.0 04/16/2014   LDLCALC 67 02/27/2018   ALT 20 02/27/2018   AST 18 02/27/2018   NA 139 02/27/2018   K 5.1 02/27/2018   CL 102 02/27/2018   CREATININE 0.89 02/27/2018   BUN 16 02/27/2018    CO2 29 02/27/2018   TSH 1.86 08/19/2017   PSA 1.24 07/24/2014   INR 1.02 05/17/2014   HGBA1C 6.1 (A) 03/31/2018   MICROALBUR 1.7 06/24/2015    Lab Results  Component Value Date   TSH 1.86 08/19/2017   Lab Results  Component Value Date   WBC 5.0 02/27/2018   HGB 14.6 02/27/2018   HCT 43.3 02/27/2018   MCV 92.6 02/27/2018   PLT 148.0 (L) 02/27/2018   Lab Results  Component Value Date   NA 139 02/27/2018   K 5.1 02/27/2018   CO2 29 02/27/2018   GLUCOSE 150 (H) 02/27/2018   BUN 16 02/27/2018   CREATININE 0.89 02/27/2018   BILITOT 0.9 02/27/2018   ALKPHOS 83 02/27/2018   AST 18 02/27/2018   ALT 20 02/27/2018   PROT 6.7 02/27/2018   ALBUMIN 4.1 02/27/2018   CALCIUM 9.3 02/27/2018   ANIONGAP 8 05/18/2014   GFR 88.89 02/27/2018   Lab Results  Component Value Date   CHOL 123 02/27/2018   Lab Results  Component Value Date   HDL 34.20 (L) 02/27/2018   Lab Results  Component Value Date   LDLCALC 67 02/27/2018   Lab Results  Component Value Date   TRIG 109.0 02/27/2018   Lab Results  Component Value Date   CHOLHDL 4 02/27/2018   Lab Results  Component Value Date   HGBA1C 6.1 (A) 03/31/2018       Assessment & Plan:   Problem List Items Addressed This Visit    Hypertension (Chronic)    Well controlled, no changes to meds. Encouraged heart healthy diet such as the DASH diet and exercise as tolerated.       Hyperlipidemia    Tolerating statin, encouraged heart healthy diet, avoid trans fats, minimize simple carbs and saturated fats. Increase exercise as tolerated      Vitamin D deficiency    Supplement and monitor      Well controlled type 2 diabetes mellitus with peripheral neuropathy (HCC)    hgba1c acceptable, minimize simple carbs. Increase exercise as tolerated. Continue current meds. [facility] in January and he reports his sugars have been good and his fingerstick sugars running 80 to 110       Dyslipidemia    Encouraged heart healthy  diet, increase exercise, avoid trans fats, consider a krill oil cap daily.         I am having Michael Cunningham maintain his aspirin EC, multivitamin with minerals, acetaminophen, Polyethylene Glycol 3350 (MIRALAX PO), losartan, metoprolol tartrate, Accu-Chek FastClix Lancets, glucose blood, simvastatin, Insulin Syringe-Needle U-100, Probiotic Product (PROBIOTIC-10 PO), Cholecalciferol (VITAMIN D PO), glucagon, insulin NPH Human, insulin regular, prasugrel, nitroGLYCERIN, triamcinolone cream, and triamcinolone 0.1 % cream : eucerin.  No orders of the defined types were placed in this encounter.   I discussed the assessment and treatment plan with the patient. The patient was provided an opportunity to ask questions and all were answered. The patient agreed with the plan and demonstrated an understanding of the instructions.   The patient was advised  to call back or seek an in-person evaluation if the symptoms worsen or if the condition fails to improve as anticipated.  I provided 25 minutes of non-face-to-face time during this encounter.   Penni Homans, MD

## 2018-07-09 NOTE — Assessment & Plan Note (Signed)
Supplement and monitor 

## 2018-08-03 ENCOUNTER — Other Ambulatory Visit: Payer: Self-pay

## 2018-08-03 ENCOUNTER — Ambulatory Visit (INDEPENDENT_AMBULATORY_CARE_PROVIDER_SITE_OTHER): Payer: Medicare HMO | Admitting: Internal Medicine

## 2018-08-03 ENCOUNTER — Encounter: Payer: Self-pay | Admitting: Internal Medicine

## 2018-08-03 DIAGNOSIS — E1142 Type 2 diabetes mellitus with diabetic polyneuropathy: Secondary | ICD-10-CM

## 2018-08-03 DIAGNOSIS — E782 Mixed hyperlipidemia: Secondary | ICD-10-CM

## 2018-08-03 DIAGNOSIS — E669 Obesity, unspecified: Secondary | ICD-10-CM

## 2018-08-03 NOTE — Progress Notes (Signed)
Patient ID: Michael Cunningham, male   DOB: 03-Nov-1945, 73 y.o.   MRN: [id number]  Patient location: Home My location: Office  Referring Provider: Mosie Lukes, MD  I connected with the patient on 08/03/18 at 10:24 AM EDT by a video enabled telemedicine application and verified that I am speaking with the correct person.   I discussed the limitations of evaluation and management by telemedicine and the availability of in person appointments. The patient expressed understanding and agreed to proceed.   Details of the encounter are shown below.  HPI: Michael Cunningham is a 73 y.o.-year-old male, presenting for f/u DM2, dx in 1999, insulin-dependent since ~2008, controlled, with complications (CAD, peripheral neuropathy, DR). Last visit 4 months ago. He is 60% disabled >> highest priority group within his insurance.  Last hemoglobin A1c: Lab Results  Component Value Date   HGBA1C 6.1 (A) 03/31/2018   HGBA1C 6.3 (A) 11/29/2017   HGBA1C 7.0 05/24/2017  05/2016: HbA1c calculated from the fructosamine is better, at 5.9%. 10/01/2015: HbA1c calculated from fructosamine is still great, at 6.6%. 06/24/2015: HbA1c calculated from fructosamine is much lower, at 6.4%.  Pt is on a regimen of: Insulin Before breakfast Before lunch Before dinner Bedtime  Regular  15-20  20-25  25-30   NPH  15 >> 20    44   Please do not use more than 15 units of R insulin before a meal prior to exercise (rehab, working in the yard, etc.).  He cannot afford analog insulin. He tried Metformin R and XR >> diarrhea He tried Januvia.  Pt checks his sugars twice a day: - am: 110-142, 162 >> 106-146 >> 88-152, 168  >> 114-135 - 2h after b'fast:  71-190, 246 >> 69, 141-177, 212 >> n/c - before lunch:  68-77 >> 62-127, 154, 213 >> 75-120, 200 - 2h after lunch: n/c >> 76 >> 70 >> n/c  >> 55, 75 >> n/c - before dinner:  68-136, 244 >> 59-136, 161 >> 90-130 - 2h after dinner: n/c >> 174 >> 111-132, 254 >> 117-132 -  bedtime:  115-122 >> 69-196 >> 66-152, 189 >> 114-180 - nighttime:90 >> n/c Lowest sugar was 35 x1 >> 55 >> 71; he has hypoglycemia awareness in the 90s. Highest sugar was 254 >> 214.  Glucometer: AccuChek [employer] >> Precision Extra by [employer] (given by [facility]).  Pt's meals are: - Breakfast: cheerios + skim milk + banana  - Lunch: sandwich or leftover  - Dinner: meat + veggies + starch - Snacks: PB crackers; wheat thins, apple or grapes, pear   -No CKD, last BUN/creatinine:  Lab Results  Component Value Date   BUN 16 02/27/2018   CREATININE 0.89 02/27/2018  On losartan. -+ HL; last set of lipids: Lab Results  Component Value Date   CHOL 123 02/27/2018   HDL 34.20 (L) 02/27/2018   LDLCALC 67 02/27/2018   LDLDIRECT 108.0 04/16/2014   TRIG 109.0 02/27/2018   CHOLHDL 4 02/27/2018   On simvastatin. - last eye exam was in 10/2017.  He has a history of mild TR, but not at last check.  He also has angle-closure glaucoma and had surgery in both eyes.  Dr. Ellie Lunch. - no numbness and tingling in his  feet - [facility] (Dr Amalia Hailey).  He does have mild peripheral neuropathy in his right foot  He was dx'ed with Diabetic hand sd. (cheiroarthropathy) >> he had steroid injections  He also has a history of HTN.  He  has vitamin D def >> was on Ergocalciferol >> on supplementation  ROS: Constitutional: no weight gain/no weight loss, no fatigue, no subjective hyperthermia, no subjective hypothermia Eyes: no blurry vision, no xerophthalmia ENT: no sore throat, no nodules palpated in neck, no dysphagia, no odynophagia, no hoarseness Cardiovascular: no CP/no SOB/no palpitations/no leg swelling Respiratory: no cough/no SOB/no wheezing Gastrointestinal: no N/no V/no D/no C/no acid reflux Musculoskeletal: no muscle aches/no joint aches Skin: no rashes, no hair loss Neurological: no tremors/no numbness/no tingling/no dizziness  I reviewed pt's medications, allergies, PMH, social hx, family  hx, and changes were documented in the history of present illness. Otherwise, unchanged from my initial visit note.  Past Medical History:  Diagnosis Date  . CAD (coronary artery disease)    a. 05/2014 [place] s/p DES x4 to mid-distLAD and DESx1 to DI  . Complication of anesthesia    11'12 Surgery with excrutiating "head about to explode" upon awakening  -lasted several hours postop  ( SURGERY DONE 08-13-2011 AT [facility] PT DID OK POST OP)  . Diverticulosis of colon    LEFT SIDE  . Exposure to Agent [facility] 10/18/2016   [place]  . History of small bowel obstruction    X2  28 YRS AGO AND LAST ONE 04/2014--  RESOLVED WITHOUT SURGICAL INTERVENTION  . Hyperlipidemia   . Hypertension   . Knee pain, bilateral 08/13/2011  . Left ureteral calculus   . Multiple lipomas    hx. mulitple and some remains -arms, legs  . Obesity, unspecified 09/18/2013  . Obstructive sleep apnea 01/01/2016  . Recurrent kidney stones 07/08/2013   Follows with Dr Gaynelle Arabian He believes they are Calcium oxalate stones  . Thrombocytopenia ([place]) 07/07/2014  . Type 2 diabetes mellitus ([place])   . Vitamin D deficiency 07/02/2014   Past Surgical History:  Procedure Laterality Date  . COLONOSCOPY W/ POLYPECTOMY  LAST ONE 2013  . CORONARY STENT PLACEMENT  05/17/2014   PROXIMAL X 4  MID & DISTAL LAD  DIAGONAL   . KNEE ARTHROSCOPY  08/13/2011   right ARTHROSCOPY KNEE;  Surgeon: Gearlean Alf, MD;  Location: [facility];  Service: Orthopedics;  Laterality: Right;  medial  meniscal debridement, excision of plica, synovectomy  . KNEE ARTHROSCOPY Left 11/16/2013   Procedure: LEFT KNEE ARTHROSCOPY MEDIAL MENISCAL DEBRIDEMENT, CHONDROPLASTY;  Surgeon: Gearlean Alf, MD;  Location: [facility];  Service: Orthopedics;  Laterality: Left;  . KNEE ARTHROSCOPY W/ MENISCECTOMY Right 01-29-2011   twice, first trimmed mediscus and cyst  . LEFT HEART CATHETERIZATION WITH CORONARY ANGIOGRAM N/A 05/17/2014   Procedure: LEFT HEART CATHETERIZATION WITH CORONARY ANGIOGRAM;   Surgeon: Burnell Blanks, MD;  Location: [facility] CATH LAB;  Service: Cardiovascular;  Laterality: N/A;  . [place]   multiple, 10-12  . PERCUTANEOUS CORONARY STENT INTERVENTION (PCI-S)  05/17/2014   Procedure: PERCUTANEOUS CORONARY STENT INTERVENTION (PCI-S);  Surgeon: Burnell Blanks, MD;  Location: [facility] CATH LAB;  Service: Cardiovascular;;  Diag1 and Prox to Distal LAD   History   Social History  . Marital Status: Married    Spouse Name: N/A  . Number of Children: 2   Social History Main Topics  . Smoking status: Former Smoker -- 1.00 packs/day for 4 years    Types: Cigarettes    Quit date: 06/14/1967  . Smokeless tobacco: Never Used  . Alcohol Use: No  . Drug Use: No  . Sexual Activity: Yes     Comment: lives with wife   Social History Narrative  Retired Systems analyst improvement.  Lives with wife.     Current Outpatient Medications on File Prior to Visit  Medication Sig Dispense Refill  . ACCU-CHEK FASTCLIX LANCETS MISC Use to test blood sugar 2 times daily. Dx: E11.40 204 each 3  . acetaminophen (TYLENOL) 500 MG tablet Take 1,000 mg by mouth every 6 (six) hours as needed for mild pain. Reported on 03/31/2015    . aspirin EC 81 MG tablet Take 81 mg by mouth daily with breakfast.     . Cholecalciferol (VITAMIN D PO) Take 5,000 Units by mouth daily.    [name] glucagon (GLUCAGON EMERGENCY) 1 MG injection Inject 1 mg into the muscle once as needed for up to 1 dose. 1 each 12  . glucose blood (ACCU-CHEK AVIVA PLUS) test strip Use to test blood sugar 2 times daily. Dx: E11.40 200 each 3  . insulin NPH Human (HUMULIN N) 100 UNIT/ML injection INJECT UNDER SKIN 20 UNITS IN THE AM AND 44 UNITS AT BEDTIME. 30 mL 3  . insulin regular (HUMULIN R) 100 units/mL injection Inject 0.2-0.35 mLs (20-35 Units total) into the skin 3 (three) times daily before meals. 30 mL 3  . Insulin Syringe-Needle U-100 (TRUEPLUS INSULIN SYRINGE) 30G X 5/16" 0.5 ML MISC USE TO INJECT INSULIN 5 TIMES  DAILY. 200 each 3  . losartan (COZAAR) 50 MG tablet TAKE 1&1/2 (75 MG TOTAL) TABLETS BY MOUTH EVERY MORNING (Patient taking differently: Take 25 mg by mouth daily. TAKE 3 25MG  TABLETS BY MOUTH EVERY MORNING) 180 tablet 3  . metoprolol tartrate (LOPRESSOR) 25 MG tablet Take 1 tablet (25 mg total) by mouth 2 (two) times daily. 180 tablet 3  . Multiple Vitamin (MULTIVITAMIN WITH MINERALS) TABS tablet Take 1 tablet by mouth daily.    . nitroGLYCERIN (NITROSTAT) 0.4 MG SL tablet Place 1 tablet (0.4 mg total) under the tongue every 5 (five) minutes as needed for chest pain. 25 tablet 3  . Polyethylene Glycol 3350 (MIRALAX PO) Take by mouth as needed. Reported on 03/31/2015    . prasugrel (EFFIENT) 10 MG TABS tablet Take 1 tablet (10 mg total) by mouth daily. 90 tablet 3  . Probiotic Product (PROBIOTIC-10 PO) Take 1 tablet by mouth daily.    . simvastatin (ZOCOR) 40 MG tablet TAKE 1 TABLET (40 MG) BY MOUTH AT BEDTIME. 90 tablet 2  . Triamcinolone Acetonide (TRIAMCINOLONE 0.1 % CREAM : EUCERIN) CREA Apply 1 application topically 2 (two) times daily as needed for rash. 1 each 1  . triamcinolone cream (KENALOG) 0.1 % Apply 1 application topically 2 (two) times daily. 80 g 0   No current facility-administered medications on file prior to visit.    Allergies  Allergen Reactions  . Ciprofloxacin Other (See Comments)    Abdominal pain  . Lipitor [Atorvastatin Calcium] Other (See Comments)    Severe muscle pain  . Lisinopril Cough  . Metformin And Related Diarrhea   Family History  Problem Relation Age of Onset  . Hypertension Mother   . Cancer Mother 13       ovarian  . Heart disease Father        CHF  . Heart attack Father 44  . Cancer Sister        breast- just finished her last radiation  . Heart disease Maternal Grandfather        ?  [name] Heart attack Paternal Grandfather 32  . Heart attack Paternal Uncle 24       Sudden death  .  Diabetes Neg Hx    PE: Today: temp 97.32F, BP: 140/74, P: 52,  Pulse ox 97%, weight 253 lbs There were no vitals taken for this visit. There is no height or weight on file to calculate BMI.  Wt Readings from Last 3 Encounters:  03/31/18 263 lb (119.3 kg)  03/03/18 258 lb 6.4 oz (117.2 kg)  01/20/18 261 lb (118.4 kg)   Constitutional:  in NAD  The physical exam was not performed (virtual visit).  ASSESSMENT: 1. DM2, insulin-dependent, now better controlled, with complications - CAD, status post stent 05/2014 - Dr Percival Spanish - Peripheral neuropathy - DR  2. HL  3.  Obesity  PLAN:  1. Patient with longstanding, fairly well-controlled diabetes, with low blood sugars at last visits after which we started to reduce his insulin doses.  Of note, he had a severe low, at 35, after he was very active and we discussed about not using more than 15 units of regular insulin with a meal when he plans to work in the garden or stay active.  At last visit I also advised him about changing his meals by adding agrees to every meal, not snacking between meals, eliminating milk and adding healthy fats to his breakfast. -At last visit, his HbA1c was 6.1%. -He continues to have some hyperglycemic spikes if he has dietary indiscretions during the day.overall, sugars have improved from before, and they are mostly at goal during the day and at the upper limit of our target at bedtime.  He eats snacks/sweets after dinner and we discussed again about reducing these.  He did increase his NPH in the morning by himself and we will continue this higher dose.   - I advised him to: Patient Instructions   Please continue: Insulin Before breakfast Before lunch Before dinner Bedtime  Regular  15-20  20-25  25-30   NPH  20    44   Please do not use more than  15 units of R insulin before a meal prior to exercise (rehab, working in the yard, etc.).  Please return in 3-4 months.  - will check her HbA1c when she returns to the clinic - continue checking sugars at different times  of the day - check 3x a day, rotating checks - advised for yearly eye exams >> he is UTD - Return to clinic in 3-4 mo with sugar log    2. HL - Reviewed latest lipid panel from 02/2018: HDL low, LDL at goal Lab Results  Component Value Date   CHOL 123 02/27/2018   HDL 34.20 (L) 02/27/2018   LDLCALC 67 02/27/2018   LDLDIRECT 108.0 04/16/2014   TRIG 109.0 02/27/2018   CHOLHDL 4 02/27/2018  - Continues Zocor without side effects.  3.  Obesity -We discussed at last visit about the need to lose weight and I suggested changes in his diet.  We also reduced his insulin doses which should also help with weight loss - he lost ~10 lbs since last OV!   Philemon Kingdom, MD PhD [facility] Endocrinology

## 2018-08-03 NOTE — Patient Instructions (Signed)
Please continue: Insulin Before breakfast Before lunch Before dinner Bedtime  Regular  15-20  20-25  25-30   NPH  15    44   Please do not use more than  15 units of R insulin before a meal prior to exercise (rehab, working in the yard, etc.).  Please return in 3-4 months.

## 2018-08-09 DIAGNOSIS — G4733 Obstructive sleep apnea (adult) (pediatric): Secondary | ICD-10-CM | POA: Diagnosis not present

## 2018-08-11 ENCOUNTER — Encounter: Payer: Self-pay | Admitting: Podiatry

## 2018-08-11 ENCOUNTER — Other Ambulatory Visit: Payer: Self-pay

## 2018-08-11 ENCOUNTER — Ambulatory Visit: Payer: Medicare HMO | Admitting: Podiatry

## 2018-08-11 DIAGNOSIS — B351 Tinea unguium: Secondary | ICD-10-CM

## 2018-08-11 DIAGNOSIS — M79676 Pain in unspecified toe(s): Secondary | ICD-10-CM

## 2018-08-11 DIAGNOSIS — R21 Rash and other nonspecific skin eruption: Secondary | ICD-10-CM

## 2018-08-11 DIAGNOSIS — B353 Tinea pedis: Secondary | ICD-10-CM

## 2018-08-11 DIAGNOSIS — E1149 Type 2 diabetes mellitus with other diabetic neurological complication: Secondary | ICD-10-CM | POA: Diagnosis not present

## 2018-08-11 MED ORDER — CLOTRIMAZOLE-BETAMETHASONE 1-0.05 % EX CREA: 1.0000 "application " | TOPICAL_CREAM | Freq: Two times a day (BID) | CUTANEOUS | 0 refills | Status: DC

## 2018-08-11 MED FILL — CLOTRIMAZOLE-BETAMETHASONE: 1-0.05 | 14 days supply | Qty: 30 | Fill #0

## 2018-08-12 NOTE — Progress Notes (Signed)
Subjective: 73 y.o. returns the office today for painful, elongated, thickened toenails which he cannot trim himself. Denies any redness or drainage around the nails.  He has noticed a small rash on his toes which is been red but denies any drainage or pus or any open sores and no itching.  He says the corn left fifth toe is resolved as he is putting his shoes which are wider.  Denies any acute changes since last appointment and no new complaints today. Denies any systemic complaints such as fevers, chills, nausea, vomiting.   PCP: Mosie Lukes, MD  Endocrinologist: Dr. Benjiman Core, MD   Last A1c 6.1 (03/31/2018)  Objective: AAO 3, NAD DP/PT pulses palpable, CRT less than 3 seconds Sensation is mildly decreased with SWM but overall no change. Nails hypertrophic, dystrophic, elongated, brittle, discolored 10. There is tenderness overlying the nails 1-5 bilaterally. There is no surrounding erythema or drainage along the nail sites. On the dorsal aspect of the toes there is erythematous changes, skin rash.  Mild cracking of the skin.  There is no drainage or pus.  No swelling.  No ascending cellulitis or clinical signs of bacterial infection.  This appears to be more fungal in nature. No pain with calf compression, swelling, warmth, erythema.  Assessment: Patient presents with symptomatic onychomycosis; skin rash/tinea pedis  Plan: -Treatment options including alternatives, risks, complications were discussed -Nails sharply debrided 10 without complication/bleeding. -I lightly debrided the left 5th toe pre-ulcerative callus without any complications or bleeding. Offloading at all times. Monitor for any skin breakdown.  -We will continue to monitor neuropathy symptoms.  -Lotrisone cream ordered. -Discussed daily foot inspection. If there are any changes, to call the office immediately.  -Follow-up in 3 months or sooner if any problems are to arise. In the meantime, encouraged to call  the office with any questions, concerns, changes symptoms.  Celesta Gentile, DPM

## 2018-08-14 ENCOUNTER — Ambulatory Visit: Payer: Medicare HMO | Admitting: Podiatry

## 2018-09-04 ENCOUNTER — Ambulatory Visit (INDEPENDENT_AMBULATORY_CARE_PROVIDER_SITE_OTHER): Payer: Medicare HMO | Admitting: Family Medicine

## 2018-09-04 ENCOUNTER — Encounter: Payer: Self-pay | Admitting: Family Medicine

## 2018-09-04 ENCOUNTER — Other Ambulatory Visit: Payer: Self-pay

## 2018-09-04 DIAGNOSIS — I10 Essential (primary) hypertension: Secondary | ICD-10-CM

## 2018-09-04 DIAGNOSIS — E782 Mixed hyperlipidemia: Secondary | ICD-10-CM

## 2018-09-04 DIAGNOSIS — R51 Headache: Secondary | ICD-10-CM

## 2018-09-04 DIAGNOSIS — R519 Headache, unspecified: Secondary | ICD-10-CM | POA: Insufficient documentation

## 2018-09-04 DIAGNOSIS — E559 Vitamin D deficiency, unspecified: Secondary | ICD-10-CM

## 2018-09-04 MED ORDER — CEFDINIR 300 MG PO CAPS: 300.0000 mg | ORAL_CAPSULE | Freq: Two times a day (BID) | ORAL | 0 refills | Status: AC

## 2018-09-04 NOTE — Assessment & Plan Note (Signed)
Up slightly this week with headache [id number] systolic over 65Y. Likely secondary to pain. no changes to meds. Encouraged heart healthy diet such as the DASH diet and exercise as tolerated. He will monitor and notify us if worsens.

## 2018-09-04 NOTE — Assessment & Plan Note (Signed)
Supplement and monitor 

## 2018-09-04 NOTE — Assessment & Plan Note (Signed)
Encouraged heart healthy diet, increase exercise, avoid trans fats, consider a krill oil cap daily 

## 2018-09-04 NOTE — Assessment & Plan Note (Signed)
He notes the Headache started 9 to 10 days ago and has been persistent if not treated with Tylenol since then.  He describes it is essentially the worst headache he has had 9 out of 10 pain although when he takes the Tylenol he reports it can diminish to 1-2 out of 10 pain.  He does endorse some congestion with a cough productive of some green phlegm and rhinorrhea but denies any fevers or other neurologic symptoms.  Consider frontal sinusitis but given the severity of his headache will also proceed with CAT scan of the head to rule out any more concerning symptoms.  Will treat with cefdinir 300 mg twice daily in the interim.  Patient will take Mucinex 600 mg twice daily and nasal saline flushes several times a day as well.  Seek care if symptoms worsen.

## 2018-09-04 NOTE — Progress Notes (Signed)
Virtual Visit via Video Note  I connected with Michael Cunningham on 09/04/18 at 10:40 AM EDT by a video enabled telemedicine application and verified that I am speaking with the correct person using two identifiers.  Location: Patient: home Provider: office   I discussed the limitations of evaluation and management by telemedicine and the availability of in person appointments. The patient expressed understanding and agreed to proceed. Michael Cunningham, CMA was able to get patient set up on video visit.     Subjective:    Patient ID: Michael Cunningham, male    DOB: 10-Jul-1945, 73 y.o.   MRN: [id number]  Chief Complaint  Patient presents with  . Headache    x 10 days behind left eye, some green mucus. Head ache pain does not go away just eases with Tylenol.     HPI Patient is in today for follow up on chronic medical concerns including hypertension, hyperlipidemia and diabetes. He notes the Headache started 9 to 10 days ago and has been persistent if not treated with Tylenol since then.  He describes it is essentially the worst headache he has had 9 out of 10 pain although when he takes the Tylenol he reports it can diminish to 1-2 out of 10 pain.  He does endorse some congestion with a cough productive of some green phlegm and rhinorrhea but denies any fevers or other neurologic symptoms. Denies CP/palp/SOB/fevers/GI or GU c/o. Taking meds as prescribed  Past Medical History:  Diagnosis Date  . CAD (coronary artery disease)    a. 05/2014 [place] s/p DES x4 to mid-distLAD and DESx1 to DI  . Complication of anesthesia    11'12 Surgery with excrutiating "head about to explode" upon awakening  -lasted several hours postop  ( SURGERY DONE 08-13-2011 AT [facility] PT DID OK POST OP)  . Diverticulosis of colon    LEFT SIDE  . Exposure to Agent [facility] 10/18/2016   [place]  . History of small bowel obstruction    X2  28 YRS AGO AND LAST ONE 04/2014--  RESOLVED WITHOUT SURGICAL INTERVENTION  . Hyperlipidemia   .  Hypertension   . Knee pain, bilateral 08/13/2011  . Left ureteral calculus   . Multiple lipomas    hx. mulitple and some remains -arms, legs  . Obesity, unspecified 09/18/2013  . Obstructive sleep apnea 01/01/2016  . Recurrent kidney stones 07/08/2013   Follows with Dr Gaynelle Arabian He believes they are Calcium oxalate stones  . Thrombocytopenia ([place]) 07/07/2014  . Type 2 diabetes mellitus ([place])   . Vitamin D deficiency 07/02/2014    Past Surgical History:  Procedure Laterality Date  . COLONOSCOPY W/ POLYPECTOMY  LAST ONE 2013  . CORONARY STENT PLACEMENT  05/17/2014   PROXIMAL X 4  MID & DISTAL LAD  DIAGONAL   . KNEE ARTHROSCOPY  08/13/2011   right ARTHROSCOPY KNEE;  Surgeon: Gearlean Alf, MD;  Location: [facility];  Service: Orthopedics;  Laterality: Right;  medial  meniscal debridement, excision of plica, synovectomy  . KNEE ARTHROSCOPY Left 11/16/2013   Procedure: LEFT KNEE ARTHROSCOPY MEDIAL MENISCAL DEBRIDEMENT, CHONDROPLASTY;  Surgeon: Gearlean Alf, MD;  Location: [facility];  Service: Orthopedics;  Laterality: Left;  . KNEE ARTHROSCOPY W/ MENISCECTOMY Right 01-29-2011   twice, first trimmed mediscus and cyst  . LEFT HEART CATHETERIZATION WITH CORONARY ANGIOGRAM N/A 05/17/2014   Procedure: LEFT HEART CATHETERIZATION WITH CORONARY ANGIOGRAM;  Surgeon: Burnell Blanks, MD;  Location: [facility] CATH LAB;  Service: Cardiovascular;  Laterality: N/A;  .  [place]   multiple, 10-12  . PERCUTANEOUS CORONARY STENT INTERVENTION (PCI-S)  05/17/2014   Procedure: PERCUTANEOUS CORONARY STENT INTERVENTION (PCI-S);  Surgeon: Burnell Blanks, MD;  Location: [facility] CATH LAB;  Service: Cardiovascular;;  Diag1 and Prox to Distal LAD    Family History  Problem Relation Age of Onset  . Hypertension Mother   . Cancer Mother 12       ovarian  . Heart disease Father        CHF  . Heart attack Father 68  . Cancer Sister        breast- just finished her last radiation  . Heart disease Maternal  Grandfather        ?  [name] Heart attack Paternal Grandfather 37  . Heart attack Paternal Uncle 69       Sudden death  . Diabetes Neg Hx     Social History   Socioeconomic History  . Marital status: Married    Spouse name: Not on file  . Number of children: 2  . Years of education: Not on file  . Highest education level: Not on file  Occupational History  . Not on file  Social Needs  . Financial resource strain: Not on file  . Food insecurity    Worry: Not on file    Inability: Not on file  . Transportation needs    Medical: Not on file    Non-medical: Not on file  Tobacco Use  . Smoking status: Former Smoker    Packs/day: 1.00    Years: 4.00    Pack years: 4.00    Types: Cigarettes    Quit date: 06/14/1967    Years since quitting: 51.2  . Smokeless tobacco: Never Used  Substance and Sexual Activity  . Alcohol use: No    Alcohol/week: 0.0 standard drinks  . Drug use: No  . Sexual activity: Yes    Comment: lives with wife  Lifestyle  . Physical activity    Days per week: Not on file    Minutes per session: Not on file  . Stress: Not on file  Relationships  . Social Herbalist on phone: Not on file    Gets together: Not on file    Attends religious service: Not on file    Active member of club or organization: Not on file    Attends meetings of clubs or organizations: Not on file    Relationship status: Not on file  . Intimate partner violence    Fear of current or ex partner: Not on file    Emotionally abused: Not on file    Physically abused: Not on file    Forced sexual activity: Not on file  Other Topics Concern  . 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      Outpatient Medications Prior to Visit  Medication Sig Dispense Refill  . ACCU-CHEK FASTCLIX LANCETS MISC Use to test blood sugar 2 times daily. Dx: E11.40 204 each 3  . acetaminophen (TYLENOL) 500 MG tablet Take 1,000 mg by mouth every 6  (six) hours as needed for mild pain. Reported on 03/31/2015    . aspirin EC 81 MG tablet Take 81 mg by mouth daily with breakfast.     . Cholecalciferol (VITAMIN D PO) Take 5,000 Units by mouth daily.    . clotrimazole-betamethasone (LOTRISONE) cream Apply 1 application topically 2 (two) times daily. [place]  g 0  . glucagon (GLUCAGON EMERGENCY) 1 MG injection Inject 1 mg into the muscle once as needed for up to 1 dose. 1 each 12  . glucose blood (ACCU-CHEK AVIVA PLUS) test strip Use to test blood sugar 2 times daily. Dx: E11.40 200 each 3  . insulin NPH Human (HUMULIN N) 100 UNIT/ML injection INJECT UNDER SKIN 20 UNITS IN THE AM AND 44 UNITS AT BEDTIME. 30 mL 3  . insulin regular (HUMULIN R) 100 units/mL injection Inject 0.2-0.35 mLs (20-35 Units total) into the skin 3 (three) times daily before meals. 30 mL 3  . Insulin Syringe-Needle U-100 (TRUEPLUS INSULIN SYRINGE) 30G X 5/16" 0.5 ML MISC USE TO INJECT INSULIN 5 TIMES DAILY. 200 each 3  . losartan (COZAAR) 50 MG tablet TAKE 1&1/2 (75 MG TOTAL) TABLETS BY MOUTH EVERY MORNING (Patient taking differently: Take 25 mg by mouth daily. TAKE 3 25MG  TABLETS BY MOUTH EVERY MORNING) 180 tablet 3  . metoprolol tartrate (LOPRESSOR) 25 MG tablet Take 1 tablet (25 mg total) by mouth 2 (two) times daily. 180 tablet 3  . Multiple Vitamin (MULTIVITAMIN WITH MINERALS) TABS tablet Take 1 tablet by mouth daily.    . nitroGLYCERIN (NITROSTAT) 0.4 MG SL tablet Place 1 tablet (0.4 mg total) under the tongue every 5 (five) minutes as needed for chest pain. 25 tablet 3  . Polyethylene Glycol 3350 (MIRALAX PO) Take by mouth as needed. Reported on 03/31/2015    . prasugrel (EFFIENT) 10 MG TABS tablet Take 1 tablet (10 mg total) by mouth daily. 90 tablet 3  . Probiotic Product (PROBIOTIC-10 PO) Take 1 tablet by mouth daily.    . simvastatin (ZOCOR) 40 MG tablet TAKE 1 TABLET (40 MG) BY MOUTH AT BEDTIME. 90 tablet 2  . Triamcinolone Acetonide (TRIAMCINOLONE 0.1 % CREAM : EUCERIN)  CREA Apply 1 application topically 2 (two) times daily as needed for rash. 1 each 1  . triamcinolone cream (KENALOG) 0.1 % Apply 1 application topically 2 (two) times daily. 80 g 0   No facility-administered medications prior to visit.     Allergies  Allergen Reactions  . Ciprofloxacin Other (See Comments)    Abdominal pain  . Lipitor [Atorvastatin Calcium] Other (See Comments)    Severe muscle pain  . Lisinopril Cough  . Metformin And Related Diarrhea    Review of Systems  Constitutional: Negative for fever and malaise/fatigue.  HENT: Positive for congestion and sinus pain. Negative for ear discharge, ear pain, hearing loss and nosebleeds.   Eyes: Negative for blurred vision.  Respiratory: Positive for cough and sputum production. Negative for shortness of breath.   Cardiovascular: Negative for chest pain, palpitations and leg swelling.  Gastrointestinal: Negative for abdominal pain, blood in stool and nausea.  Genitourinary: Negative for dysuria and frequency.  Musculoskeletal: Negative for falls.  Skin: Negative for rash.  Neurological: Negative for dizziness, loss of consciousness and headaches.  Endo/Heme/Allergies: Negative for environmental allergies.  Psychiatric/Behavioral: Negative for depression. The patient is not nervous/anxious.        Objective:    Physical Exam Constitutional:      Appearance: He is well-developed. He is obese. He is not ill-appearing.  HENT:     Head: Normocephalic and atraumatic.  Pulmonary:     Effort: Pulmonary effort is normal.  Neurological:     Mental Status: He is alert and oriented to person, place, and time.  Psychiatric:        Mood and Affect: Mood normal.  Behavior: Behavior normal.     BP (!) 148/73 (BP Location: Left Arm, Patient Position: Sitting, Cuff Size: Large)   Pulse (!) 58   Temp 97.7 F (36.5 C)   Wt 253 lb (114.8 kg)   SpO2 95%   BMI 37.36 kg/m  Wt Readings from Last 3 Encounters:  09/04/18 253  lb (114.8 kg)  03/31/18 263 lb (119.3 kg)  03/03/18 258 lb 6.4 oz (117.2 kg)    Diabetic Foot Exam - Simple   No data filed     Lab Results  Component Value Date   WBC 5.0 02/27/2018   HGB 14.6 02/27/2018   HCT 43.3 02/27/2018   PLT 148.0 (L) 02/27/2018   GLUCOSE 150 (H) 02/27/2018   CHOL 123 02/27/2018   TRIG 109.0 02/27/2018   HDL 34.20 (L) 02/27/2018   LDLDIRECT 108.0 04/16/2014   LDLCALC 67 02/27/2018   ALT 20 02/27/2018   AST 18 02/27/2018   NA 139 02/27/2018   K 5.1 02/27/2018   CL 102 02/27/2018   CREATININE 0.89 02/27/2018   BUN 16 02/27/2018   CO2 29 02/27/2018   TSH 1.86 08/19/2017   PSA 1.24 07/24/2014   INR 1.02 05/17/2014   HGBA1C 6.1 (A) 03/31/2018   MICROALBUR 1.7 06/24/2015    Lab Results  Component Value Date   TSH 1.86 08/19/2017   Lab Results  Component Value Date   WBC 5.0 02/27/2018   HGB 14.6 02/27/2018   HCT 43.3 02/27/2018   MCV 92.6 02/27/2018   PLT 148.0 (L) 02/27/2018   Lab Results  Component Value Date   NA 139 02/27/2018   K 5.1 02/27/2018   CO2 29 02/27/2018   GLUCOSE 150 (H) 02/27/2018   BUN 16 02/27/2018   CREATININE 0.89 02/27/2018   BILITOT 0.9 02/27/2018   ALKPHOS 83 02/27/2018   AST 18 02/27/2018   ALT 20 02/27/2018   PROT 6.7 02/27/2018   ALBUMIN 4.1 02/27/2018   CALCIUM 9.3 02/27/2018   ANIONGAP 8 05/18/2014   GFR 88.89 02/27/2018   Lab Results  Component Value Date   CHOL 123 02/27/2018   Lab Results  Component Value Date   HDL 34.20 (L) 02/27/2018   Lab Results  Component Value Date   LDLCALC 67 02/27/2018   Lab Results  Component Value Date   TRIG 109.0 02/27/2018   Lab Results  Component Value Date   CHOLHDL 4 02/27/2018   Lab Results  Component Value Date   HGBA1C 6.1 (A) 03/31/2018       Assessment & Plan:   Problem List Items Addressed This Visit    Hypertension (Chronic)    Up slightly this week with headache [id number] systolic over 08M. Likely secondary to pain. no changes to  meds. Encouraged heart healthy diet such as the DASH diet and exercise as tolerated. He will monitor and notify us if worsens.        Hyperlipidemia    Encouraged heart healthy diet, increase exercise, avoid trans fats, consider a krill oil cap daily      Vitamin D deficiency    Supplement and monitor      Headache    He notes the Headache started 9 to 10 days ago and has been persistent if not treated with Tylenol since then.  He describes it is essentially the worst headache he has had 9 out of 10 pain although when he takes the Tylenol he reports it can diminish to 1-2 out of 10 pain.  He does endorse some congestion with a cough productive of some green phlegm and rhinorrhea but denies any fevers or other neurologic symptoms.  Consider frontal sinusitis but given the severity of his headache will also proceed with CAT scan of the head to rule out any more concerning symptoms.  Will treat with cefdinir 300 mg twice daily in the interim.  Patient will take Mucinex 600 mg twice daily and nasal saline flushes several times a day as well.  Seek care if symptoms worsen.      Relevant Orders   CT Head Wo Contrast      I am having Deloris M. Ngo start on cefdinir. I am also having him maintain his aspirin EC, multivitamin with minerals, acetaminophen, Polyethylene Glycol 3350 (MIRALAX PO), losartan, metoprolol tartrate, Accu-Chek FastClix Lancets, glucose blood, simvastatin, Insulin Syringe-Needle U-100, Probiotic Product (PROBIOTIC-10 PO), Cholecalciferol (VITAMIN D PO), glucagon, insulin NPH Human, insulin regular, prasugrel, nitroGLYCERIN, triamcinolone cream, triamcinolone 0.1 % cream : eucerin, and clotrimazole-betamethasone.  Meds ordered this encounter  Medications  . cefdinir (OMNICEF) 300 MG capsule    Sig: Take 1 capsule (300 mg total) by mouth 2 (two) times daily for 10 days.    Dispense:  20 capsule    Refill:  0     I discussed the assessment and treatment plan with the  patient. The patient was provided an opportunity to ask questions and all were answered. The patient agreed with the plan and demonstrated an understanding of the instructions.   The patient was advised to call back or seek an in-person evaluation if the symptoms worsen or if the condition fails to improve as anticipated.  I provided 25 minutes of non-face-to-face time during this encounter.   Penni Homans, MD

## 2018-09-05 ENCOUNTER — Encounter: Payer: Self-pay | Admitting: Family Medicine

## 2018-09-20 ENCOUNTER — Encounter: Payer: Self-pay | Admitting: Family Medicine

## 2018-09-25 ENCOUNTER — Other Ambulatory Visit (INDEPENDENT_AMBULATORY_CARE_PROVIDER_SITE_OTHER): Payer: Medicare HMO

## 2018-09-25 ENCOUNTER — Other Ambulatory Visit: Payer: Self-pay

## 2018-09-25 DIAGNOSIS — I10 Essential (primary) hypertension: Secondary | ICD-10-CM

## 2018-09-25 DIAGNOSIS — R3911 Hesitancy of micturition: Secondary | ICD-10-CM

## 2018-09-25 DIAGNOSIS — E1142 Type 2 diabetes mellitus with diabetic polyneuropathy: Secondary | ICD-10-CM

## 2018-09-25 DIAGNOSIS — R519 Headache, unspecified: Secondary | ICD-10-CM

## 2018-09-25 DIAGNOSIS — E782 Mixed hyperlipidemia: Secondary | ICD-10-CM

## 2018-09-25 LAB — LIPID PANEL
Cholesterol: 108 mg/dL (ref 0–200)
HDL: 31 mg/dL — ABNORMAL LOW (ref >39.00)
LDL Cholesterol: 57 mg/dL (ref 0–99)
NonHDL: 76.92
Total CHOL/HDL Ratio: 3
Triglycerides: 101 mg/dL (ref 0.0–149.0)
VLDL: 20.2 mg/dL (ref 0.0–40.0)

## 2018-09-25 LAB — COMPREHENSIVE METABOLIC PANEL
ALT: 23 U/L (ref 0–53)
AST: 21 U/L (ref 0–37)
Albumin: 4 g/dL (ref 3.5–5.2)
Alkaline Phosphatase: 87 U/L (ref 39–117)
BUN: 16 mg/dL (ref 6–23)
CO2: 25 mEq/L (ref 19–32)
Calcium: 8.6 mg/dL (ref 8.4–10.5)
Chloride: 105 mEq/L (ref 96–112)
Creatinine, Ser: 0.8 mg/dL (ref 0.40–1.50)
GFR: 94.43 mL/min (ref >60.00)
Glucose, Bld: 126 mg/dL — ABNORMAL HIGH (ref 70–99)
Potassium: 4.3 mEq/L (ref 3.5–5.1)
Sodium: 140 mEq/L (ref 135–145)
Total Bilirubin: 0.7 mg/dL (ref 0.2–1.2)
Total Protein: 6.6 g/dL (ref 6.0–8.3)

## 2018-09-25 LAB — CBC
HCT: 41.9 % (ref 39.0–52.0)
Hemoglobin: 14.1 g/dL (ref 13.0–17.0)
MCHC: 33.7 g/dL (ref 30.0–36.0)
MCV: 94.1 fl (ref 78.0–100.0)
Platelets: 138 10*3/uL — ABNORMAL LOW (ref 150.0–400.0)
RBC: 4.45 Mil/uL (ref 4.22–5.81)
RDW: 14.1 % (ref 11.5–15.5)
WBC: 4.4 10*3/uL (ref 4.0–10.5)

## 2018-09-25 LAB — TSH: TSH: 1.94 u[IU]/mL (ref 0.35–4.50)

## 2018-09-25 LAB — PSA: PSA: 1.92 ng/mL (ref 0.10–4.00)

## 2018-09-25 LAB — HEMOGLOBIN A1C: Hgb A1c MFr Bld: 6.8 % — ABNORMAL HIGH (ref 4.6–6.5)

## 2018-09-25 NOTE — Progress Notes (Signed)
Virtual Visit via Video Note  I connected with patient on 09/26/18 at 10:00 AM EDT by a video enabled telemedicine application and verified that I am speaking with the correct person using two identifiers.   THIS ENCOUNTER IS A VIRTUAL VISIT DUE TO COVID-19 - PATIENT WAS NOT SEEN IN THE OFFICE. PATIENT HAS CONSENTED TO VIRTUAL VISIT / TELEMEDICINE VISIT   Location of patient: home  Location of provider: office  I discussed the limitations of evaluation and management by telemedicine and the availability of in person appointments. The patient expressed understanding and agreed to proceed.   Subjective:   Michael Cunningham is a 73 y.o. male who presents for Medicare Annual/Subsequent preventive examination.  Review of Systems: No ROS.  Medicare Wellness Virtual Visit.  Visual/audio telehealth visit, UTA vital signs.   See social history for additional risk factors. Cardiac Risk Factors include: advanced age (>77men, >56 women);diabetes mellitus;dyslipidemia;hypertension;male gender;obesity (BMI >30kg/m2) Sleep patterns: Wears CPAP. Home Safety/Smoke Alarms: Feels safe in home. Smoke alarms in place.  Lives in 2 story home with wife. Master on 1st floor.  Male:   CCS- last reported 07/14/11     PSA-  Lab Results  Component Value Date   PSA 1.92 09/25/2018   PSA 1.24 07/24/2014       Objective:    Vitals: BP 139/69 Comment: all vitals reported by pt  Pulse 65   Temp 97.7 F (36.5 C) (Oral)   SpO2 97%   There is no height or weight on file to calculate BMI.  Advanced Directives 09/26/2018 09/20/2017 09/16/2016 08/24/2015 08/18/2015 08/05/2015 07/02/2014  Does Patient Have a Medical Advance Directive? Yes Yes Yes Yes Yes Yes Yes  Type of Paramedic of [place];Living will [place];Living will [place];Living will [place];Living will [place];Living will [place];Living  will [place];Living will  Does patient want to make changes to medical advance directive? No - Patient declined No - Patient declined - No - Patient declined No - Patient declined No - Patient declined No - Patient declined  Copy of [place] in Chart? Yes - validated most recent copy scanned in chart (See row information) Yes No - copy requested No - copy requested No - copy requested No - copy requested No - copy requested  Would patient like information on creating a medical advance directive? - - - - - - -  Pre-existing out of facility DNR order (yellow form or pink MOST form) - - - - - - -    Tobacco Social History   Tobacco Use  Smoking Status Former Smoker  . Packs/day: 1.00  . Years: 4.00  . Pack years: 4.00  . Types: Cigarettes  . Quit date: 06/14/1967  . Years since quitting: 51.3  Smokeless Tobacco Never Used     Counseling given: Not Answered   Clinical Intake: Pain : No/denies pain    Past Medical History:  Diagnosis Date  . CAD (coronary artery disease)    a. 05/2014 [place] s/p DES x4 to mid-distLAD and DESx1 to DI  . Complication of anesthesia    11'12 Surgery with excrutiating "head about to explode" upon awakening  -lasted several hours postop  ( SURGERY DONE 08-13-2011 AT [facility] PT DID OK POST OP)  . Diverticulosis of colon    LEFT SIDE  . Exposure to Agent [facility] 10/18/2016   [place]  . History of small  bowel obstruction    X2  28 YRS AGO AND LAST ONE 04/2014--  RESOLVED WITHOUT SURGICAL INTERVENTION  . Hyperlipidemia   . Hypertension   . Knee pain, bilateral 08/13/2011  . Left ureteral calculus   . Multiple lipomas    hx. mulitple and some remains -arms, legs  . Obesity, unspecified 09/18/2013  . Obstructive sleep apnea 01/01/2016  . Recurrent kidney stones 07/08/2013   Follows with Dr Gaynelle Arabian He believes they are Calcium oxalate stones  . Thrombocytopenia ([place]) 07/07/2014  . Type 2 diabetes mellitus ([place])   . Vitamin D  deficiency 07/02/2014   Past Surgical History:  Procedure Laterality Date  . COLONOSCOPY W/ POLYPECTOMY  LAST ONE 2013  . CORONARY STENT PLACEMENT  05/17/2014   PROXIMAL X 4  MID & DISTAL LAD  DIAGONAL   . KNEE ARTHROSCOPY  08/13/2011   right ARTHROSCOPY KNEE;  Surgeon: Gearlean Alf, MD;  Location: [facility]S;  Service: Orthopedics;  Laterality: Right;  medial  meniscal debridement, excision of plica, synovectomy  . KNEE ARTHROSCOPY Left 11/16/2013   Procedure: LEFT KNEE ARTHROSCOPY MEDIAL MENISCAL DEBRIDEMENT, CHONDROPLASTY;  Surgeon: Gearlean Alf, MD;  Location: [facility];  Service: Orthopedics;  Laterality: Left;  . KNEE ARTHROSCOPY W/ MENISCECTOMY Right 01-29-2011   twice, first trimmed mediscus and cyst  . LEFT HEART CATHETERIZATION WITH CORONARY ANGIOGRAM N/A 05/17/2014   Procedure: LEFT HEART CATHETERIZATION WITH CORONARY ANGIOGRAM;  Surgeon: Burnell Blanks, MD;  Location[facility] C[facility] LAB;  Service: Cardiovascular;  Laterality: N/A;  . [place]   multiple, 10-12  . PERCUTANEOUS CORONARY STENT INTERVENTION (PCI-S)  05/17/2014   Procedure: PERCUTANEOUS CORONARY STENT INTERVENTION (PCI-S);  Surgeon: Burnell Blanks, MD;  Location: [facility] CATH LAB;  Service: Cardiovascular;;  Diag1 and Prox to Distal LAD   Family History  Problem Relation Age of Onset  . Hypertension Mother   . Cancer Mother 56       ovarian  . Heart disease Father        CHF  . Heart attack Father 62  . Cancer Sister        breast- just finished her last radiation  . Heart disease Maternal Grandfather        ?  [name] Heart attack Paternal Grandfather 22  . Heart attack Paternal Uncle 40       Sudden death  . Diabetes Neg Hx    Social History   Socioeconomic History  . Marital status: Married    Spouse name: Not on file  . Number of children: 2  . Years of education: Not on file  . Highest education level: Not on file  Occupational History  . Not on file  Social Needs  . Financial resource  strain: Not on file  . Food insecurity    Worry: Not on file    Inability: Not on file  . Transportation needs    Medical: Not on file    Non-medical: Not on file  Tobacco Use  . Smoking status: Former Smoker    Packs/day: 1.00    Years: 4.00    Pack years: 4.00    Types: Cigarettes    Quit date: 06/14/1967    Years since quitting: 51.3  . Smokeless tobacco: Never Used  Substance and Sexual Activity  . Alcohol use: No    Alcohol/week: 0.0 standard drinks  . Drug use: No  . Sexual activity: Yes    Comment: lives with wife  Lifestyle  .  Physical activity    Days per week: Not on file    Minutes per session: Not on file  . Stress: Not on file  Relationships  . Social Herbalist on phone: Not on file    Gets together: Not on file    Attends religious service: Not on file    Active member of club or organization: Not on file    Attends meetings of clubs or organizations: Not on file    Relationship status: Not on file  Other Topics Concern  . 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      Outpatient Encounter Medications as of 09/26/2018  Medication Sig  . ACCU-CHEK FASTCLIX LANCETS MISC Use to test blood sugar 2 times daily. Dx: E11.40  . acetaminophen (TYLENOL) 500 MG tablet Take 1,000 mg by mouth every 6 (six) hours as needed for mild pain. Reported on 03/31/2015  . aspirin EC 81 MG tablet Take 81 mg by mouth daily with breakfast.   . Cholecalciferol (VITAMIN D PO) Take 5,000 Units by mouth daily.  . clotrimazole-betamethasone (LOTRISONE) cream Apply 1 application topically 2 (two) times daily.  [name] glucagon (GLUCAGON EMERGENCY) 1 MG injection Inject 1 mg into the muscle once as needed for up to 1 dose.  [name] glucose blood (ACCU-CHEK AVIVA PLUS) test strip Use to test blood sugar 2 times daily. Dx: E11.40  . insulin NPH Human (HUMULIN N) 100 UNIT/ML injection INJECT UNDER SKIN 20 UNITS IN THE AM AND 44 UNITS AT BEDTIME.   . insulin regular (HUMULIN R) 100 units/mL injection Inject 0.2-0.35 mLs (20-35 Units total) into the skin 3 (three) times daily before meals.  . Insulin Syringe-Needle U-100 (TRUEPLUS INSULIN SYRINGE) 30G X 5/16" 0.5 ML MISC USE TO INJECT INSULIN 5 TIMES DAILY.  [name] losartan (COZAAR) 50 MG tablet TAKE 1&1/2 (75 MG TOTAL) TABLETS BY MOUTH EVERY MORNING (Patient taking differently: Take 25 mg by mouth daily. TAKE 3 25MG  TABLETS BY MOUTH EVERY MORNING)  . metoprolol tartrate (LOPRESSOR) 25 MG tablet Take 1 tablet (25 mg total) by mouth 2 (two) times daily.  . Multiple Vitamin (MULTIVITAMIN WITH MINERALS) TABS tablet Take 1 tablet by mouth daily.  . nitroGLYCERIN (NITROSTAT) 0.4 MG SL tablet Place 1 tablet (0.4 mg total) under the tongue every 5 (five) minutes as needed for chest pain.  . prasugrel (EFFIENT) 10 MG TABS tablet Take 1 tablet (10 mg total) by mouth daily.  . Probiotic Product (PROBIOTIC-10 PO) Take 1 tablet by mouth daily.  . simvastatin (ZOCOR) 40 MG tablet TAKE 1 TABLET (40 MG) BY MOUTH AT BEDTIME.  . Triamcinolone Acetonide (TRIAMCINOLONE 0.1 % CREAM : EUCERIN) CREA Apply 1 application topically 2 (two) times daily as needed for rash.  . triamcinolone cream (KENALOG) 0.1 % Apply 1 application topically 2 (two) times daily.  . Polyethylene Glycol 3350 (MIRALAX PO) Take by mouth as needed. Reported on 03/31/2015   No facility-administered encounter medications on file as of 09/26/2018.     Activities of Daily Living In your present state of health, do you have any difficulty performing the following activities: 09/26/2018  Hearing? N  Vision? N  Difficulty concentrating or making decisions? N  Walking or climbing stairs? N  Dressing or bathing? N  Doing errands, shopping? N  Preparing Food and eating ? N  Using the Toilet? N  In the past six months, have you accidently leaked urine? N  Do  you have problems with loss of bowel control? N  Managing your Medications? N  Managing  your Finances? N  Housekeeping or managing your Housekeeping? N  Some recent data might be hidden    Patient Care Team: Mosie Lukes, MD as PCP - General (Family Medicine) Minus Breeding, MD as Consulting Physician (Cardiology) Philemon Kingdom, MD as Consulting Physician (Endocrinology) Gaynelle Arabian, MD as Consulting Physician (Orthopedic Surgery) Troy Sine, MD as Consulting Physician (Cardiology) Luberta Mutter, MD as Consulting Physician (Ophthalmology) Clarene Essex, MD as Consulting Physician (Gastroenterology) [place], Jennefer Bravo, MD as Consulting Physician (Dermatology) Trula Slade, DPM as Consulting Physician (Podiatry) Roseanne Kaufman, MD as Consulting Physician (Orthopedic Surgery)   Assessment:   This is a routine wellness examination for Michael Cunningham. Physical assessment deferred to PCP.  Exercise Activities and Dietary recommendations Current Exercise Habits: Home exercise routine, Time (Minutes): 20, Frequency (Times/Week): 5, Weekly Exercise (Minutes/Week): 100, Intensity: Mild, Exercise limited by: None identified Diet (meal preparation, eat out, water intake, caffeinated beverages, dairy products, fruits and vegetables): in general, a "healthy" diet  , well balanced, on average, 3 meals per day Breakfast: cereal and banana Lunch: chef salad Dinner:   Chicken and broccoli casserole Needs to drink more water. Only drinks 2 glasses per day and otherwise diet coke   Goals    . DIET - INCREASE WATER INTAKE     Drink at least 3 glasses of water per day.    . Increase physical activity     Start using your exercise machine for at least 25 minutes 2-3 days weekly.       Fall Risk Fall Risk  09/20/2017 09/16/2016 07/01/2015 04/16/2014  Falls in the past year? No No No No   Depression Screen PHQ 2/9 Scores 09/20/2017 09/16/2016 01/01/2016 07/01/2015  PHQ - 2 Score 0 0 0 0    Cognitive Function Ad8 score reviewed for issues:  Issues making decisions:no   Less interest in hobbies / activities:no  Repeats questions, stories (family complaining):no  Trouble using ordinary gadgets (microwave, computer, phone):no  Forgets the month or year: no  Mismanaging finances: no  Remembering appts:no  Daily problems with thinking and/or memory:no Ad8 score is=0     MMSE - Mini Mental State Exam 09/16/2016  Orientation to time 5  Orientation to Place 5  Registration 3  Attention/ Calculation 5  Recall 3  Language- name 2 objects 2  Language- repeat 1  Language- follow 3 step command 3  Language- read & follow direction 1  Write a sentence 1  Copy design 1  Total score 30        Immunization History  Administered Date(s) Administered  . Hepatitis A 10/18/2003  . Hepatitis B 10/18/2003, 11/29/2003  . Influenza, High Dose Seasonal PF 12/19/2015, 12/28/2016  . Influenza,inj,Quad PF,6+ Mos 01/10/2014, 12/26/2014  . Influenza,inj,quad, With Preservative 12/28/2016  . Influenza-Unspecified 11/19/2010, 12/03/2011, 12/12/2012  . Mumps 01/29/2004  . Pneumococcal Conjugate-13 01/10/2014  . Pneumococcal Polysaccharide-23 10/18/2003, 03/31/2015  . Td 05/13/1997, 05/21/2009  . Tdap 12/18/2015  . Zoster 06/06/2009  . Zoster Recombinat (Shingrix) 05/17/2017, 09/07/2017    Screening Tests Health Maintenance  Topic Date Due  . INFLUENZA VACCINE  10/14/2018  . OPHTHALMOLOGY EXAM  10/26/2018  . HEMOGLOBIN A1C  03/28/2019  . FOOT EXAM  04/07/2019  . COLONOSCOPY  07/13/2021  . TETANUS/TDAP  12/17/2025  . Hepatitis C Screening  Completed  . PNA vac Low Risk Adult  Completed       Plan:  See you next year!  Continue to eat heart healthy diet (full of fruits, vegetables, whole grains, lean protein, water--limit salt, fat, and sugar intake) and increase physical activity as tolerated.  Continue doing brain stimulating activities (puzzles, reading, adult coloring books, staying active) to keep memory sharp.   Keep up the great work!  I  have personally reviewed and noted the following in the patient's chart:   . Medical and social history . Use of alcohol, tobacco or illicit drugs  . Current medications and supplements . Functional ability and status . Nutritional status . Physical activity . Advanced directives . List of other physicians . Hospitalizations, surgeries, and ER visits in previous 12 months . Vitals . Screenings to include cognitive, depression, and falls . Referrals and appointments  In addition, I have reviewed and discussed with patient certain preventive protocols, quality metrics, and best practice recommendations. A written personalized care plan for preventive services as well as general preventive health recommendations were provided to patient.     Shela Nevin, [place]  09/26/2018

## 2018-09-26 ENCOUNTER — Encounter: Payer: Self-pay | Admitting: *Deleted

## 2018-09-26 ENCOUNTER — Ambulatory Visit (INDEPENDENT_AMBULATORY_CARE_PROVIDER_SITE_OTHER): Payer: Medicare HMO | Admitting: *Deleted

## 2018-09-26 VITALS — BP 139/69 | HR 65 | Temp 97.7°F

## 2018-09-26 DIAGNOSIS — Z0000 Encounter for general adult medical examination without abnormal findings: Secondary | ICD-10-CM | POA: Diagnosis not present

## 2018-09-26 NOTE — Patient Instructions (Signed)
See you next year!  Continue to eat heart healthy diet (full of fruits, vegetables, whole grains, lean protein, water--limit salt, fat, and sugar intake) and increase physical activity as tolerated.  Continue doing brain stimulating activities (puzzles, reading, adult coloring books, staying active) to keep memory sharp.   Keep up the great work!   Michael Cunningham , Thank you for taking time to come for your Medicare Wellness Visit. I appreciate your ongoing commitment to your health goals. Please review the following plan we discussed and let me know if I can assist you in the future.   These are the goals we discussed: Goals    . DIET - INCREASE WATER INTAKE     Drink at least 3 glasses of water per day.    . Increase physical activity     Start using your exercise machine for at least 25 minutes 2-3 days weekly.       This is a list of the screening recommended for you and due dates:  Health Maintenance  Topic Date Due  . Flu Shot  10/14/2018  . Eye exam for diabetics  10/26/2018  . Hemoglobin A1C  03/28/2019  . Complete foot exam   04/07/2019  . Colon Cancer Screening  07/13/2021  . Tetanus Vaccine  12/17/2025  .  Hepatitis C: One time screening is recommended by Center for Disease Control  (CDC) for  adults born from [birth year] through 1966.   Completed  . Pneumonia vaccines  Completed    Health Maintenance After Age 53 After age 55, you are at a higher risk for certain long-term diseases and infections as well as injuries from falls. Falls are a major cause of broken bones and head injuries in people who are older than age 52. Getting regular preventive care can help to keep you healthy and well. Preventive care includes getting regular testing and making lifestyle changes as recommended by your health care provider. Talk with your health care provider about:  Which screenings and tests you should have. A screening is a test that checks for a disease when you have no symptoms.  A  diet and exercise plan that is right for you. What should I know about screenings and tests to prevent falls? Screening and testing are the best ways to find a health problem early. Early diagnosis and treatment give you the best chance of managing medical conditions that are common after age 31. Certain conditions and lifestyle choices may make you more likely to have a fall. Your health care provider may recommend:  Regular vision checks. Poor vision and conditions such as cataracts can make you more likely to have a fall. If you wear glasses, make sure to get your prescription updated if your vision changes.  Medicine review. Work with your health care provider to regularly review all of the medicines you are taking, including over-the-counter medicines. Ask your health care provider about any side effects that may make you more likely to have a fall. Tell your health care provider if any medicines that you take make you feel dizzy or sleepy.  Osteoporosis screening. Osteoporosis is a condition that causes the bones to get weaker. This can make the bones weak and cause them to break more easily.  Blood pressure screening. Blood pressure changes and medicines to control blood pressure can make you feel dizzy.  Strength and balance checks. Your health care provider may recommend certain tests to check your strength and balance while standing, walking, or changing  positions.  Foot health exam. Foot pain and numbness, as well as not wearing proper footwear, can make you more likely to have a fall.  Depression screening. You may be more likely to have a fall if you have a fear of falling, feel emotionally low, or feel unable to do activities that you used to do.  Alcohol use screening. Using too much alcohol can affect your balance and may make you more likely to have a fall. What actions can I take to lower my risk of falls? General instructions  Talk with your health care provider about your  risks for falling. Tell your health care provider if: ? You fall. Be sure to tell your health care provider about all falls, even ones that seem minor. ? You feel dizzy, sleepy, or off-balance.  Take over-the-counter and prescription medicines only as told by your health care provider. These include any supplements.  Eat a healthy diet and maintain a healthy weight. A healthy diet includes low-fat dairy products, low-fat (lean) meats, and fiber from whole grains, beans, and lots of fruits and vegetables. Home safety  Remove any tripping hazards, such as rugs, cords, and clutter.  Install safety equipment such as grab bars in bathrooms and safety rails on stairs.  Keep rooms and walkways well-lit. Activity   Follow a regular exercise program to stay fit. This will help you maintain your balance. Ask your health care provider what types of exercise are appropriate for you.  If you need a cane or walker, use it as recommended by your health care provider.  Wear supportive shoes that have nonskid soles. Lifestyle  Do not drink alcohol if your health care provider tells you not to drink.  If you drink alcohol, limit how much you have: ? 0-1 drink a day for women. ? 0-2 drinks a day for men.  Be aware of how much alcohol is in your drink. In the [place] one drink equals one typical bottle of beer (12 oz), one-half glass of wine (5 oz), or one shot of hard liquor (1 oz).  Do not use any products that contain nicotine or tobacco, such as cigarettes and e-cigarettes. If you need help quitting, ask your health care provider. Summary  Having a healthy lifestyle and getting preventive care can help to protect your health and wellness after age 40.  Screening and testing are the best way to find a health problem early and help you avoid having a fall. Early diagnosis and treatment give you the best chance for managing medical conditions that are more common for people who are older than age  29.  Falls are a major cause of broken bones and head injuries in people who are older than age 40. Take precautions to prevent a fall at home.  Work with your health care provider to learn what changes you can make to improve your health and wellness and to prevent falls. This information is not intended to replace advice given to you by your health care provider. Make sure you discuss any questions you have with your health care provider. Document Released: 01/12/2017 Document Revised: 06/22/2018 Document Reviewed: 01/12/2017 Elsevier Patient Education  2020 [employer].

## 2018-10-17 ENCOUNTER — Telehealth: Payer: Self-pay | Admitting: Cardiovascular Disease

## 2018-10-17 NOTE — Telephone Encounter (Signed)
Recall - 10-17-18 lmtcb

## 2018-10-18 ENCOUNTER — Encounter: Payer: Self-pay | Admitting: Family Medicine

## 2018-10-18 ENCOUNTER — Encounter: Payer: Self-pay | Admitting: Podiatry

## 2018-10-19 ENCOUNTER — Encounter: Payer: Self-pay | Admitting: Family Medicine

## 2018-10-19 MED ORDER — CLOTRIMAZOLE-BETAMETHASONE 1-0.05 % EX CREA: 1.0000 "application " | TOPICAL_CREAM | Freq: Two times a day (BID) | CUTANEOUS | 0 refills | Status: DC

## 2018-10-19 MED FILL — CLOTRIMAZOLE-BETAMETHASONE: 1-0.05 | 15 days supply | Qty: 30 | Fill #0

## 2018-10-19 NOTE — Telephone Encounter (Signed)
Emailed notice of refill of the the Lotrimin and to keep his 11/10/2018 at 10:45am appt.

## 2018-10-31 DIAGNOSIS — E119 Type 2 diabetes mellitus without complications: Secondary | ICD-10-CM | POA: Diagnosis not present

## 2018-10-31 DIAGNOSIS — H52203 Unspecified astigmatism, bilateral: Secondary | ICD-10-CM | POA: Diagnosis not present

## 2018-10-31 DIAGNOSIS — H2513 Age-related nuclear cataract, bilateral: Secondary | ICD-10-CM | POA: Diagnosis not present

## 2018-10-31 DIAGNOSIS — D3131 Benign neoplasm of right choroid: Secondary | ICD-10-CM | POA: Diagnosis not present

## 2018-11-10 ENCOUNTER — Encounter: Payer: Self-pay | Admitting: Podiatry

## 2018-11-10 ENCOUNTER — Other Ambulatory Visit: Payer: Self-pay

## 2018-11-10 ENCOUNTER — Ambulatory Visit: Payer: Medicare HMO | Admitting: Podiatry

## 2018-11-10 DIAGNOSIS — B351 Tinea unguium: Secondary | ICD-10-CM | POA: Diagnosis not present

## 2018-11-10 DIAGNOSIS — E1149 Type 2 diabetes mellitus with other diabetic neurological complication: Secondary | ICD-10-CM

## 2018-11-10 DIAGNOSIS — M79676 Pain in unspecified toe(s): Secondary | ICD-10-CM

## 2018-11-10 NOTE — Progress Notes (Signed)
Subjective: 73 y.o. returns the office today for painful, elongated, thickened toenails which he cannot trim himself. Denies any redness or drainage around the nails.  The rash that he had previously has resolved with the cream that we have prescribed.  He has no new concerns.  He reports his last A1c was 6.8. Denies any systemic complaints such as fevers, chills, nausea, vomiting.   PCP: Mosie Lukes, MD  Endocrinologist: Dr. Benjiman Core, MD   Objective: AAO 3, NAD DP/PT pulses palpable, CRT less than 3 seconds Sensation is mildly decreased with SWM but overall no change.  Neuropathy symptoms are unchanged. Nails hypertrophic, dystrophic, elongated, brittle, discolored 10. There is tenderness overlying the nails 1-5 bilaterally. There is no surrounding erythema or drainage along the nail sites. No pain with calf compression, swelling, warmth, erythema.  Assessment: Patient presents with symptomatic onychomycosis  Plan: -Treatment options including alternatives, risks, complications were discussed -Nails sharply debrided 10 without complication/bleeding. -Continue to monitor neuropathy -Discussed daily foot inspection. If there are any changes, to call the office immediately.  -Follow-up in 3 months or sooner if any problems are to arise. In the meantime, encouraged to call the office with any questions, concerns, changes symptoms.  Celesta Gentile, DPM

## 2018-12-05 ENCOUNTER — Ambulatory Visit (INDEPENDENT_AMBULATORY_CARE_PROVIDER_SITE_OTHER): Payer: Medicare HMO | Admitting: Family Medicine

## 2018-12-05 ENCOUNTER — Other Ambulatory Visit: Payer: Self-pay

## 2018-12-05 ENCOUNTER — Encounter: Payer: Self-pay | Admitting: Family Medicine

## 2018-12-05 DIAGNOSIS — D696 Thrombocytopenia, unspecified: Secondary | ICD-10-CM

## 2018-12-05 DIAGNOSIS — I10 Essential (primary) hypertension: Secondary | ICD-10-CM

## 2018-12-05 DIAGNOSIS — E782 Mixed hyperlipidemia: Secondary | ICD-10-CM

## 2018-12-05 DIAGNOSIS — Z77098 Contact with and (suspected) exposure to other hazardous, chiefly nonmedicinal, chemicals: Secondary | ICD-10-CM

## 2018-12-05 DIAGNOSIS — E559 Vitamin D deficiency, unspecified: Secondary | ICD-10-CM | POA: Diagnosis not present

## 2018-12-08 NOTE — Assessment & Plan Note (Signed)
Supplement and monitor 

## 2018-12-08 NOTE — Assessment & Plan Note (Signed)
Monitor vitals weekly and report concerns.  no changes to meds. Encouraged heart healthy diet such as the DASH diet and exercise as tolerated.  

## 2018-12-08 NOTE — Progress Notes (Signed)
Virtual Visit via Video Note  I connected with Daruis Raudabaugh Cunningham on 12/05/18 at  4:00 PM EDT by a video enabled telemedicine application and verified that I am speaking with the correct person using two identifiers.  Location: Patient: home Provider: office   I discussed the limitations of evaluation and management by telemedicine and the availability of in person appointments. The patient expressed understanding and agreed to proceed. Michael Cunningham, CMA was able to get patient set up on video visit.     Subjective:    Patient ID: Michael Cunningham, male    DOB: 07-25-1945, 73 y.o.   MRN: [id number]  No chief complaint on file.   HPI Patient is in today for follow up on chronic medical concerns including hyperlipidemia, hypertension, vitamin D deficiency and more. No recent febrile illness or hospitalizations. Is maintaining quarantine well with his wife. They are trying to maintain a heart healthy diet. He continues to seek some of his care but no visit scheduled with them until February 2021. Denies CP/palp/SOB/HA/congestion/fevers/GI or GU c/o. Taking meds as prescribed  Past Medical History:  Diagnosis Date  . CAD (coronary artery disease)    a. 05/2014 [place] s/p DES x4 to mid-distLAD and DESx1 to DI  . Complication of anesthesia    11'12 Surgery with excrutiating "head about to explode" upon awakening  -lasted several hours postop  ( SURGERY DONE 08-13-2011 AT [facility] PT DID OK POST OP)  . Diverticulosis of colon    LEFT SIDE  . Exposure to Agent [facility] 10/18/2016   [place]  . History of small bowel obstruction    X2  28 YRS AGO AND LAST ONE 04/2014--  RESOLVED WITHOUT SURGICAL INTERVENTION  . Hyperlipidemia   . Hypertension   . Knee pain, bilateral 08/13/2011  . Left ureteral calculus   . Multiple lipomas    hx. mulitple and some remains -arms, legs  . Obesity, unspecified 09/18/2013  . Obstructive sleep apnea 01/01/2016  . Recurrent kidney stones 07/08/2013   Follows with Dr Gaynelle Arabian  He believes they are Calcium oxalate stones  . Thrombocytopenia ([place]) 07/07/2014  . Type 2 diabetes mellitus ([place])   . Vitamin D deficiency 07/02/2014    Past Surgical History:  Procedure Laterality Date  . COLONOSCOPY W/ POLYPECTOMY  LAST ONE 2013  . CORONARY STENT PLACEMENT  05/17/2014   PROXIMAL X 4  MID & DISTAL LAD  DIAGONAL   . KNEE ARTHROSCOPY  08/13/2011   right ARTHROSCOPY KNEE;  Surgeon: Gearlean Alf, MD;  Location: [facility];  Service: Orthopedics;  Laterality: Right;  medial  meniscal debridement, excision of plica, synovectomy  . KNEE ARTHROSCOPY Left 11/16/2013   Procedure: LEFT KNEE ARTHROSCOPY MEDIAL MENISCAL DEBRIDEMENT, CHONDROPLASTY;  Surgeon: Gearlean Alf, MD;  Location: [facility];  Service: Orthopedics;  Laterality: Left;  . KNEE ARTHROSCOPY W/ MENISCECTOMY Right 01-29-2011   twice, first trimmed mediscus and cyst  . LEFT HEART CATHETERIZATION WITH CORONARY ANGIOGRAM N/A 05/17/2014   Procedure: LEFT HEART CATHETERIZATION WITH CORONARY ANGIOGRAM;  Surgeon: Burnell Blanks, MD;  Location[facility] CATH LAB;  Service: Cardiovascular;  Laterality: N/A;  . [place]   multiple, 10-12  . PERCUTANEOUS CORONARY STENT INTERVENTION (PCI-S)  05/17/2014   Procedure: PERCUTANEOUS CORONARY STENT INTERVENTION (PCI-S);  Surgeon: Burnell Blanks, MD;  Location: [facility] CATH LAB;  Service: Cardiovascular;;  Diag1 and Prox to Distal LAD    Family History  Problem Relation Age of Onset  . Hypertension Mother   .  Cancer Mother 37       ovarian  . Heart disease Father        CHF  . Heart attack Father 69  . Cancer Sister        breast- just finished her last radiation  . Heart disease Maternal Grandfather        ?  [name] Heart attack Paternal Grandfather 76  . Heart attack Paternal Uncle 64       Sudden death  . Diabetes Neg Hx     Social History   Socioeconomic History  . Marital status: Married    Spouse name: Not on file  . Number of children: 2  . Years of  education: Not on file  . Highest education level: Not on file  Occupational History  . Not on file  Social Needs  . Financial resource strain: Not on file  . Food insecurity    Worry: Not on file    Inability: Not on file  . Transportation needs    Medical: Not on file    Non-medical: Not on file  Tobacco Use  . Smoking status: Former Smoker    Packs/day: 1.00    Years: 4.00    Pack years: 4.00    Types: Cigarettes    Quit date: 06/14/1967    Years since quitting: 51.5  . Smokeless tobacco: Never Used  Substance and Sexual Activity  . Alcohol use: No    Alcohol/week: 0.0 standard drinks  . Drug use: No  . Sexual activity: Yes    Comment: lives with wife  Lifestyle  . Physical activity    Days per week: Not on file    Minutes per session: Not on file  . Stress: Not on file  Relationships  . Social Herbalist on phone: Not on file    Gets together: Not on file    Attends religious service: Not on file    Active member of club or organization: Not on file    Attends meetings of clubs or organizations: Not on file    Relationship status: Not on file  . Intimate partner violence    Fear of current or ex partner: Not on file    Emotionally abused: Not on file    Physically abused: Not on file    Forced sexual activity: Not on file  Other Topics Concern  . 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      Outpatient Medications Prior to Visit  Medication Sig Dispense Refill  . ACCU-CHEK FASTCLIX LANCETS MISC Use to test blood sugar 2 times daily. Dx: E11.40 204 each 3  . acetaminophen (TYLENOL) 500 MG tablet Take 1,000 mg by mouth every 6 (six) hours as needed for mild pain. Reported on 03/31/2015    . aspirin EC 81 MG tablet Take 81 mg by mouth daily with breakfast.     . Cholecalciferol (VITAMIN D PO) Take 5,000 Units by mouth daily.    . clotrimazole-betamethasone (LOTRISONE) cream Apply 1 application topically  2 (two) times daily. 30 g 0  . glucagon (GLUCAGON EMERGENCY) 1 MG injection Inject 1 mg into the muscle once as needed for up to 1 dose. 1 each 12  . glucose blood (ACCU-CHEK AVIVA PLUS) test strip Use to test blood sugar 2 times daily. Dx: E11.40 200 each 3  . insulin NPH Human (HUMULIN N) 100 UNIT/ML injection INJECT UNDER SKIN 20 UNITS  IN THE AM AND 44 UNITS AT BEDTIME. 30 mL 3  . insulin regular (HUMULIN R) 100 units/mL injection Inject 0.2-0.35 mLs (20-35 Units total) into the skin 3 (three) times daily before meals. 30 mL 3  . Insulin Syringe-Needle U-100 (TRUEPLUS INSULIN SYRINGE) 30G X 5/16" 0.5 ML MISC USE TO INJECT INSULIN 5 TIMES DAILY. 200 each 3  . losartan (COZAAR) 50 MG tablet TAKE 1&1/2 (75 MG TOTAL) TABLETS BY MOUTH EVERY MORNING (Patient taking differently: Take 25 mg by mouth daily. TAKE 3 25MG  TABLETS BY MOUTH EVERY MORNING) 180 tablet 3  . metoprolol tartrate (LOPRESSOR) 25 MG tablet Take 1 tablet (25 mg total) by mouth 2 (two) times daily. 180 tablet 3  . Multiple Vitamin (MULTIVITAMIN WITH MINERALS) TABS tablet Take 1 tablet by mouth daily.    . nitroGLYCERIN (NITROSTAT) 0.4 MG SL tablet Place 1 tablet (0.4 mg total) under the tongue every 5 (five) minutes as needed for chest pain. 25 tablet 3  . Polyethylene Glycol 3350 (MIRALAX PO) Take by mouth as needed. Reported on 03/31/2015    . prasugrel (EFFIENT) 10 MG TABS tablet Take 1 tablet (10 mg total) by mouth daily. 90 tablet 3  . Probiotic Product (PROBIOTIC-10 PO) Take 1 tablet by mouth daily.    . simvastatin (ZOCOR) 40 MG tablet TAKE 1 TABLET (40 MG) BY MOUTH AT BEDTIME. 90 tablet 2  . Triamcinolone Acetonide (TRIAMCINOLONE 0.1 % CREAM : EUCERIN) CREA Apply 1 application topically 2 (two) times daily as needed for rash. 1 each 1  . triamcinolone cream (KENALOG) 0.1 % Apply 1 application topically 2 (two) times daily. 80 g 0   No facility-administered medications prior to visit.     Allergies  Allergen Reactions  .  Ciprofloxacin Other (See Comments)    Abdominal pain  . Lipitor [Atorvastatin Calcium] Other (See Comments)    Severe muscle pain  . Lisinopril Cough  . Metformin And Related Diarrhea    Review of Systems  Constitutional: Negative for fever and malaise/fatigue.  HENT: Negative for congestion.   Eyes: Negative for blurred vision.  Respiratory: Negative for shortness of breath.   Cardiovascular: Negative for chest pain, palpitations and leg swelling.  Gastrointestinal: Negative for abdominal pain, blood in stool and nausea.  Genitourinary: Negative for dysuria and frequency.  Musculoskeletal: Negative for falls.  Skin: Negative for rash.  Neurological: Negative for dizziness, loss of consciousness and headaches.  Endo/Heme/Allergies: Negative for environmental allergies.  Psychiatric/Behavioral: Negative for depression. The patient is not nervous/anxious.        Objective:    Physical Exam Constitutional:      Appearance: Normal appearance. He is obese. He is not ill-appearing.  HENT:     Head: Normocephalic and atraumatic.     Nose: Nose normal.  Eyes:     General:        Right eye: No discharge.        Left eye: No discharge.  Pulmonary:     Effort: Pulmonary effort is normal.  Neurological:     Mental Status: He is alert and oriented to person, place, and time.  Psychiatric:        Behavior: Behavior normal.     BP 132/70 (BP Location: Left Arm, Patient Position: Sitting, Cuff Size: Large)   Pulse (!) 56   Temp (!) 97.5 F (36.4 C) (Oral)   SpO2 96%  Wt Readings from Last 3 Encounters:  09/04/18 253 lb (114.8 kg)  03/31/18 263 lb (119.3 kg)  03/03/18 258 lb 6.4 oz (117.2 kg)    Diabetic Foot Exam - Simple   No data filed     Lab Results  Component Value Date   WBC 4.4 09/25/2018   HGB 14.1 09/25/2018   HCT 41.9 09/25/2018   PLT 138.0 (L) 09/25/2018   GLUCOSE 126 (H) 09/25/2018   CHOL 108 09/25/2018   TRIG 101.0 09/25/2018   HDL 31.00 (L)  09/25/2018   LDLDIRECT 108.0 04/16/2014   LDLCALC 57 09/25/2018   ALT 23 09/25/2018   AST 21 09/25/2018   NA 140 09/25/2018   K 4.3 09/25/2018   CL 105 09/25/2018   CREATININE 0.80 09/25/2018   BUN 16 09/25/2018   CO2 25 09/25/2018   TSH 1.94 09/25/2018   PSA 1.92 09/25/2018   INR 1.02 05/17/2014   HGBA1C 6.8 (H) 09/25/2018   MICROALBUR 1.7 06/24/2015    Lab Results  Component Value Date   TSH 1.94 09/25/2018   Lab Results  Component Value Date   WBC 4.4 09/25/2018   HGB 14.1 09/25/2018   HCT 41.9 09/25/2018   MCV 94.1 09/25/2018   PLT 138.0 (L) 09/25/2018   Lab Results  Component Value Date   NA 140 09/25/2018   K 4.3 09/25/2018   CO2 25 09/25/2018   GLUCOSE 126 (H) 09/25/2018   BUN 16 09/25/2018   CREATININE 0.80 09/25/2018   BILITOT 0.7 09/25/2018   ALKPHOS 87 09/25/2018   AST 21 09/25/2018   ALT 23 09/25/2018   PROT 6.6 09/25/2018   ALBUMIN 4.0 09/25/2018   CALCIUM 8.6 09/25/2018   ANIONGAP 8 05/18/2014   GFR 94.43 09/25/2018   Lab Results  Component Value Date   CHOL 108 09/25/2018   Lab Results  Component Value Date   HDL 31.00 (L) 09/25/2018   Lab Results  Component Value Date   LDLCALC 57 09/25/2018   Lab Results  Component Value Date   TRIG 101.0 09/25/2018   Lab Results  Component Value Date   CHOLHDL 3 09/25/2018   Lab Results  Component Value Date   HGBA1C 6.8 (H) 09/25/2018       Assessment & Plan:   Problem List Items Addressed This Visit    Hypertension (Chronic)    Monitor vitals weekly and report concerns no changes to meds. Encouraged heart healthy diet such as the DASH diet and exercise as tolerated.       Hyperlipidemia    Tolerating statin, encouraged heart healthy diet, avoid trans fats, minimize simple carbs and saturated fats. Increase exercise as tolerated      Vitamin D deficiency    Supplement and monitor      Thrombocytopenia (HCC)    Asymptomatic and mild, monitor      Exposure to Agent [employer] to follow with the veteran's Administration         I am having Aniel M. Battle maintain his aspirin EC, multivitamin with minerals, acetaminophen, Polyethylene Glycol 3350 (MIRALAX PO), losartan, metoprolol tartrate, Accu-Chek FastClix Lancets, glucose blood, simvastatin, Insulin Syringe-Needle U-100, Probiotic Product (PROBIOTIC-10 PO), Cholecalciferol (VITAMIN D PO), glucagon, insulin NPH Human, insulin regular, prasugrel, nitroGLYCERIN, triamcinolone cream, triamcinolone 0.1 % cream : eucerin, and clotrimazole-betamethasone.  No orders of the defined types were placed in this encounter.    I discussed the assessment and treatment plan with the patient. The patient was provided an opportunity to ask questions and all were answered. The patient agreed with the plan and demonstrated an understanding  of the instructions.   The patient was advised to call back or seek an in-person evaluation if the symptoms worsen or if the condition fails to improve as anticipated.  I provided 25 minutes of non-face-to-face time during this encounter.   Penni Homans, MD

## 2018-12-08 NOTE — Assessment & Plan Note (Signed)
Tolerating statin, encouraged heart healthy diet, avoid trans fats, minimize simple carbs and saturated fats. Increase exercise as tolerated 

## 2018-12-08 NOTE — Assessment & Plan Note (Signed)
Asymptomatic and mild, monitor

## 2018-12-08 NOTE — Assessment & Plan Note (Signed)
Continues to follow with the veteran's Administration

## 2018-12-11 ENCOUNTER — Telehealth: Payer: Self-pay | Admitting: *Deleted

## 2018-12-11 DIAGNOSIS — E669 Obesity, unspecified: Secondary | ICD-10-CM

## 2018-12-11 DIAGNOSIS — E782 Mixed hyperlipidemia: Secondary | ICD-10-CM

## 2018-12-11 DIAGNOSIS — E559 Vitamin D deficiency, unspecified: Secondary | ICD-10-CM

## 2018-12-11 DIAGNOSIS — I10 Essential (primary) hypertension: Secondary | ICD-10-CM

## 2018-12-11 NOTE — Telephone Encounter (Signed)
Copied from [place] [id number]. Topic: General - Inquiry >> Dec 11, 2018  2:59 PM Scherrie Gerlach wrote: Reason for CRM: pt states his AVS from 12/05/18 told him to come in for a lab on/after 12/27/18.  But there are no orders in.  Pt has been scheduled, can you put orders in for the pt?

## 2018-12-12 ENCOUNTER — Other Ambulatory Visit: Payer: Self-pay

## 2018-12-12 ENCOUNTER — Ambulatory Visit (INDEPENDENT_AMBULATORY_CARE_PROVIDER_SITE_OTHER): Payer: Medicare HMO | Admitting: Internal Medicine

## 2018-12-12 ENCOUNTER — Encounter: Payer: Self-pay | Admitting: Internal Medicine

## 2018-12-12 DIAGNOSIS — E782 Mixed hyperlipidemia: Secondary | ICD-10-CM

## 2018-12-12 DIAGNOSIS — E1142 Type 2 diabetes mellitus with diabetic polyneuropathy: Secondary | ICD-10-CM

## 2018-12-12 DIAGNOSIS — E669 Obesity, unspecified: Secondary | ICD-10-CM | POA: Diagnosis not present

## 2018-12-12 NOTE — Patient Instructions (Addendum)
Please continue: Insulin Before breakfast Before lunch Before dinner Bedtime  Regular  15  20-25  25-30   NPH  20    44   Please do not use more than 15 units of R insulin before a meal prior to exercise (rehab, working in the yard, etc.).  Please return in 3-4 months.

## 2018-12-12 NOTE — Telephone Encounter (Signed)
Lab orders have been placed

## 2018-12-12 NOTE — Progress Notes (Signed)
Patient ID: Michael Cunningham, male   DOB: [birth year], 73 y.o.   MRN: [id number]  Patient location: Home My location: Office  Referring Provider: Mosie Lukes, MD  I connected with the patient on 12/12/18 at 10:03 AM EDT by a video enabled telemedicine application and verified that I am speaking with the correct person.   I discussed the limitations of evaluation and management by telemedicine and the availability of in person appointments. The patient expressed understanding and agreed to proceed.   Details of the encounter are shown below.  HPI: Michael Cunningham is a 73 y.o.-year-old male, presenting for f/u DM2, dx in 1999, insulin-dependent since ~2008, controlled, with complications (CAD, peripheral neuropathy, DR). Last visit 4 months ago (virtual) He is 74% disabled >> highest priority group within his insurance.  He started consistent exercise in 09/2018.   Reviewed HbA1c levels: Lab Results  Component Value Date   HGBA1C 6.8 (H) 09/25/2018   HGBA1C 6.1 (A) 03/31/2018   HGBA1C 6.3 (A) 11/29/2017  05/2016: HbA1c calculated from the fructosamine is better, at 5.9%. 10/01/2015: HbA1c calculated from fructosamine is still great, at 6.6%. 06/24/2015: HbA1c calculated from fructosamine is much lower, at 6.4%.  Pt is on a regimen of: Insulin Before breakfast Before lunch Before dinner Bedtime  Regular  15  20-25  25-30   NPH  20    44   Please do not use more than 15 units of R insulin before a meal prior to exercise (rehab, working in the yard, etc.). He cannot afford analog insulin. He tried Metformin R and XR >> diarrhea He tried Januvia.  Pt checks his sugars 2x a day: - am:  106-146 >> 88-152, 168  >> 114-135 >> 111, 120-130, 172 - 2h after b'fast:  71-190, 246 >> 69, 141-177, 212 >> n/c - before lunch:  62-127, 154, 213 >> 75-120, 200 >> 57, 105-120 - 2h after lunch:  70 >> n/c  >> 55, 75 >> n/c - before dinner:  59-136, 161 >> 90-130 >> 64, 82, 100-112 - 2h after  dinner: 111-132, 254 >> 117-132 >> n/c - bedtime: 69-196 >> 66-152, 189 >> 114-180 >> n/c - nighttime:90 >> n/c Lowest sugar was 35 x1 >> 55 >> 71 >> 57 (delayed lunch); he has hypoglycemia awareness in the 90s. Highest sugar was 254 >> 214 >> 172.  Glucometer: AccuChek Aviva >> Precision Extra by [employer] (given by [facility]).  Pt's meals are: - Breakfast: cheerios + skim milk + banana  - Lunch: sandwich or leftover  - Dinner: meat + veggies + starch - Snacks: PB crackers; wheat thins, apple or grapes, pear   -No CKD, last BUN/creatinine:  Lab Results  Component Value Date   BUN 16 09/25/2018   CREATININE 0.80 09/25/2018  On losartan. -+ HL; last set of lipids: Lab Results  Component Value Date   CHOL 108 09/25/2018   HDL 31.00 (L) 09/25/2018   LDLCALC 57 09/25/2018   LDLDIRECT 108.0 04/16/2014   TRIG 101.0 09/25/2018   CHOLHDL 3 09/25/2018   On simvastatin. - last eye exam was in 10/2018.  He has a history of mild DR, but not at last check.  He also has angle-closure glaucoma and had surgery in both eyes.  + cataracts. Dr. Ellie Lunch. -He denies numbness and tingling in his  feet - [facility] (Dr Amalia Hailey).  He has mild peripheral neuropathy in his right foot.  He was dx'ed with Diabetic hand sd. (cheiroarthropathy) >> he had  steroid injections.  He also has a history of HTN.  He has a history of vitamin D deficiency.  ROS: Constitutional: no weight gain/no weight loss, no fatigue, no subjective hyperthermia, no subjective hypothermia Eyes: no blurry vision, no xerophthalmia ENT: no sore throat, no nodules palpated in neck, no dysphagia, no odynophagia, no hoarseness Cardiovascular: no CP/no SOB/no palpitations/no leg swelling Respiratory: no cough/no SOB/no wheezing Gastrointestinal: no N/no V/no D/no C/no acid reflux Musculoskeletal: no muscle aches/no joint aches Skin: no rashes, no hair loss Neurological: no tremors/no numbness/no tingling/no dizziness  I reviewed  pt's medications, allergies, PMH, social hx, family hx, and changes were documented in the history of present illness. Otherwise, unchanged from my initial visit note.  Past Medical History:  Diagnosis Date  . CAD (coronary artery disease)    a. 05/2014 [place] s/p DES x4 to mid-distLAD and DESx1 to DI  . Complication of anesthesia    11'12 Surgery with excrutiating "head about to explode" upon awakening  -lasted several hours postop  ( SURGERY DONE 08-13-2011 AT [facility] PT DID OK POST OP)  . Diverticulosis of colon    LEFT SIDE  . Exposure to Agent [facility] 10/18/2016   [place]  . History of small bowel obstruction    X2  28 YRS AGO AND LAST ONE 04/2014--  RESOLVED WITHOUT SURGICAL INTERVENTION  . Hyperlipidemia   . Hypertension   . Knee pain, bilateral 08/13/2011  . Left ureteral calculus   . Multiple lipomas    hx. mulitple and some remains -arms, legs  . Obesity, unspecified 09/18/2013  . Obstructive sleep apnea 01/01/2016  . Recurrent kidney stones 07/08/2013   Follows with Dr Gaynelle Arabian He believes they are Calcium oxalate stones  . Thrombocytopenia ([place]) 07/07/2014  . Type 2 diabetes mellitus ([place])   . Vitamin D deficiency 07/02/2014   Past Surgical History:  Procedure Laterality Date  . COLONOSCOPY W/ POLYPECTOMY  LAST ONE 2013  . CORONARY STENT PLACEMENT  05/17/2014   PROXIMAL X 4  MID & DISTAL LAD  DIAGONAL   . KNEE ARTHROSCOPY  08/13/2011   right ARTHROSCOPY KNEE;  Surgeon: Gearlean Alf, MD;  Location: [facility];  Service: Orthopedics;  Laterality: Right;  medial  meniscal debridement, excision of plica, synovectomy  . KNEE ARTHROSCOPY Left 11/16/2013   Procedure: LEFT KNEE ARTHROSCOPY MEDIAL MENISCAL DEBRIDEMENT, CHONDROPLASTY;  Surgeon: Gearlean Alf, MD;  Location: [facility];  Service: Orthopedics;  Laterality: Left;  . KNEE ARTHROSCOPY W/ MENISCECTOMY Right 01-29-2011   twice, first trimmed mediscus and cyst  . LEFT HEART CATHETERIZATION WITH CORONARY ANGIOGRAM N/A 05/17/2014   Procedure:  LEFT HEART CATHETERIZATION WITH CORONARY ANGIOGRAM;  Surgeon: Burnell Blanks, MD;  Location: [facility] CATH LAB;  Service: Cardiovascular;  Laterality: N/A;  . [place]   multiple, 10-12  . PERCUTANEOUS CORONARY STENT INTERVENTION (PCI-S)  05/17/2014   Procedure: PERCUTANEOUS CORONARY STENT INTERVENTION (PCI-S);  Surgeon: Burnell Blanks, MD;  Location: [facility] CATH LAB;  Service: Cardiovascular;;  Diag1 and Prox to Distal LAD   History   Social History  . Marital Status: Married    Spouse Name: N/A  . Number of Children: 2   Social History Main Topics  . Smoking status: Former Smoker -- 1.00 packs/day for 4 years    Types: Cigarettes    Quit date: 06/14/1967  . Smokeless tobacco: Never Used  . Alcohol Use: No  . Drug Use: No  . 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     Current Outpatient Medications on File Prior to Visit  Medication Sig Dispense Refill  . ACCU-CHEK FASTCLIX LANCETS MISC Use to test blood sugar 2 times daily. Dx: E11.40 204 each 3  . acetaminophen (TYLENOL) 500 MG tablet Take 1,000 mg by mouth every 6 (six) hours as needed for mild pain. Reported on 03/31/2015    . aspirin EC 81 MG tablet Take 81 mg by mouth daily with breakfast.     . Cholecalciferol (VITAMIN D PO) Take 5,000 Units by mouth daily.    . clotrimazole-betamethasone (LOTRISONE) cream Apply 1 application topically 2 (two) times daily. 30 g 0  . glucagon (GLUCAGON EMERGENCY) 1 MG injection Inject 1 mg into the muscle once as needed for up to 1 dose. 1 each 12  . glucose blood (ACCU-CHEK AVIVA PLUS) test strip Use to test blood sugar 2 times daily. Dx: E11.40 200 each 3  . insulin NPH Human (HUMULIN N) 100 UNIT/ML injection INJECT UNDER SKIN 20 UNITS IN THE AM AND 44 UNITS AT BEDTIME. 30 mL 3  . insulin regular (HUMULIN R) 100 units/mL injection Inject 0.2-0.35 mLs (20-35 Units total) into the skin  3 (three) times daily before meals. 30 mL 3  . Insulin Syringe-Needle U-100 (TRUEPLUS INSULIN SYRINGE) 30G X 5/16" 0.5 ML MISC USE TO INJECT INSULIN 5 TIMES DAILY. 200 each 3  . losartan (COZAAR) 50 MG tablet TAKE 1&1/2 (75 MG TOTAL) TABLETS BY MOUTH EVERY MORNING (Patient taking differently: Take 25 mg by mouth daily. TAKE 3 25MG  TABLETS BY MOUTH EVERY MORNING) 180 tablet 3  . metoprolol tartrate (LOPRESSOR) 25 MG tablet Take 1 tablet (25 mg total) by mouth 2 (two) times daily. 180 tablet 3  . Multiple Vitamin (MULTIVITAMIN WITH MINERALS) TABS tablet Take 1 tablet by mouth daily.    . nitroGLYCERIN (NITROSTAT) 0.4 MG SL tablet Place 1 tablet (0.4 mg total) under the tongue every 5 (five) minutes as needed for chest pain. 25 tablet 3  . Polyethylene Glycol 3350 (MIRALAX PO) Take by mouth as needed. Reported on 03/31/2015    . prasugrel (EFFIENT) 10 MG TABS tablet Take 1 tablet (10 mg total) by mouth daily. 90 tablet 3  . Probiotic Product (PROBIOTIC-10 PO) Take 1 tablet by mouth daily.    . simvastatin (ZOCOR) 40 MG tablet TAKE 1 TABLET (40 MG) BY MOUTH AT BEDTIME. 90 tablet 2  . Triamcinolone Acetonide (TRIAMCINOLONE 0.1 % CREAM : EUCERIN) CREA Apply 1 application topically 2 (two) times daily as needed for rash. 1 each 1  . triamcinolone cream (KENALOG) 0.1 % Apply 1 application topically 2 (two) times daily. 80 g 0   No current facility-administered medications on file prior to visit.    Allergies  Allergen Reactions  . Ciprofloxacin Other (See Comments)    Abdominal pain  . Lipitor [Atorvastatin Calcium] Other (See Comments)    Severe muscle pain  . Lisinopril Cough  . Metformin And Related Diarrhea   Family History  Problem Relation Age of Onset  . Hypertension Mother   . Cancer Mother 106       ovarian  . Heart disease Father        CHF  . Heart attack Father 33  . Cancer Sister        breast- just finished her last radiation  . Heart disease Maternal Grandfather        ?  [name]  Heart attack Paternal Grandfather 42  . Heart attack Paternal Uncle 79       Sudden death  . Diabetes Neg Hx    PE: Wt 253.2 lbs, BP 134/67, HR 52, Pulse oximetry: 95% There were no vitals taken for this visit. There is no height or weight on file to calculate BMI.  Wt Readings from Last 3 Encounters:  09/04/18 253 lb (114.8 kg)  03/31/18 263 lb (119.3 kg)  03/03/18 258 lb 6.4 oz (117.2 kg)   Constitutional:  in NAD  The physical exam was not performed (virtual visit).  ASSESSMENT: 1. DM2, insulin-dependent, now better controlled, with complications - CAD, status post stent 05/2014 - Dr Percival Spanish - Peripheral neuropathy - DR  2. HL  3.  Obesity  PLAN:  1. Patient with longstanding, fairly well-controlled diabetes, with low blood sugars earlier this year, after which we started to reduce his insulin doses.  He had a very low blood sugar then, at 35, he was very active and we discussed about not using more than 15 units of regular insulin with a meal if he plans to be active after the meal.  We also discussed at last visit about reducing snacking between meals, eliminating milk and adding healthy fats to his breakfast.  At last visit, HbA1c was at goal, at 6.1%, but latest HbA1c from 2 months ago was higher, at 6.8%.  At last visit (virtual), he was still having some hyperglycemic spikes after dietary indiscretions, but sugars were improved compared to before. -At this visit, sugars are lower than expected from the HbA1c and I suspect that they improved after 09/2018.  He tells me that he started exercise afterwards.  He even developed low blood sugars at the beginning of the month, but he was able to eliminate them after he decrease his morning regular insulin dose.  We will continue this dose.  Since sugars are at goal later in the day, will continue with the rest of the regimen. - I advised him to: Patient Instructions   Please continue: Insulin Before breakfast Before lunch Before  dinner Bedtime  Regular  15  20-25  25-30   NPH  20    44   Please do not use more than 15 units of R insulin before a meal prior to exercise (rehab, working in the yard, etc.).  Please return in 4 months.  - He has another appointment for labs with PCP in less than 1 month and he will have another HbA1c at that time. - advised to check sugars at different times of the day - 3x a day, rotating check times - advised for yearly eye exams >> he is UTD - will get the flu shot from the [place] - drive in clinic - return to clinic in 4 months    2. HL -Reviewed the latest lipid panel from 09/2018: LDL at goal, HDL low Lab Results  Component Value Date   CHOL 108 09/25/2018   HDL 31.00 (L) 09/25/2018   LDLCALC 57 09/25/2018   LDLDIRECT 108.0 04/16/2014   TRIG 101.0 09/25/2018   CHOLHDL 3 09/25/2018  -Continue Zocor without side effects.  3.  Obesity -he lost 10 pounds before last office visit -wt stable since last OV -Continue exercise   Philemon Kingdom, MD PhD [facility] Endocrinology

## 2018-12-28 ENCOUNTER — Other Ambulatory Visit: Payer: Self-pay

## 2018-12-28 ENCOUNTER — Other Ambulatory Visit (INDEPENDENT_AMBULATORY_CARE_PROVIDER_SITE_OTHER): Payer: Medicare HMO

## 2018-12-28 DIAGNOSIS — E559 Vitamin D deficiency, unspecified: Secondary | ICD-10-CM | POA: Diagnosis not present

## 2018-12-28 DIAGNOSIS — I10 Essential (primary) hypertension: Secondary | ICD-10-CM

## 2018-12-28 DIAGNOSIS — E782 Mixed hyperlipidemia: Secondary | ICD-10-CM

## 2018-12-28 DIAGNOSIS — E669 Obesity, unspecified: Secondary | ICD-10-CM | POA: Diagnosis not present

## 2018-12-28 LAB — COMPREHENSIVE METABOLIC PANEL
ALT: 23 U/L (ref 0–53)
AST: 22 U/L (ref 0–37)
Albumin: 4.3 g/dL (ref 3.5–5.2)
Alkaline Phosphatase: 84 U/L (ref 39–117)
BUN: 16 mg/dL (ref 6–23)
CO2: 28 mEq/L (ref 19–32)
Calcium: 9.2 mg/dL (ref 8.4–10.5)
Chloride: 102 mEq/L (ref 96–112)
Creatinine, Ser: 0.91 mg/dL (ref 0.40–1.50)
GFR: 81.33 mL/min (ref >60.00)
Glucose, Bld: 137 mg/dL — ABNORMAL HIGH (ref 70–99)
Potassium: 4.3 mEq/L (ref 3.5–5.1)
Sodium: 137 mEq/L (ref 135–145)
Total Bilirubin: 0.9 mg/dL (ref 0.2–1.2)
Total Protein: 7.1 g/dL (ref 6.0–8.3)

## 2018-12-28 LAB — CBC
HCT: 44.2 % (ref 39.0–52.0)
Hemoglobin: 14.9 g/dL (ref 13.0–17.0)
MCHC: 33.8 g/dL (ref 30.0–36.0)
MCV: 93.7 fl (ref 78.0–100.0)
Platelets: 152 10*3/uL (ref 150.0–400.0)
RBC: 4.71 Mil/uL (ref 4.22–5.81)
RDW: 13.5 % (ref 11.5–15.5)
WBC: 5.5 10*3/uL (ref 4.0–10.5)

## 2018-12-28 LAB — LIPID PANEL
Cholesterol: 133 mg/dL (ref 0–200)
HDL: 33.9 mg/dL — ABNORMAL LOW (ref >39.00)
LDL Cholesterol: 67 mg/dL (ref 0–99)
NonHDL: 99.59
Total CHOL/HDL Ratio: 4
Triglycerides: 164 mg/dL — ABNORMAL HIGH (ref 0.0–149.0)
VLDL: 32.8 mg/dL (ref 0.0–40.0)

## 2018-12-28 LAB — VITAMIN D 25 HYDROXY (VIT D DEFICIENCY, FRACTURES): VITD: 40.49 ng/mL (ref 30.00–100.00)

## 2018-12-28 LAB — HEMOGLOBIN A1C: Hgb A1c MFr Bld: 6.8 % — ABNORMAL HIGH (ref 4.6–6.5)

## 2018-12-28 LAB — TSH: TSH: 2.55 u[IU]/mL (ref 0.35–4.50)

## 2019-01-12 ENCOUNTER — Ambulatory Visit: Payer: Medicare HMO | Admitting: Podiatry

## 2019-01-12 ENCOUNTER — Other Ambulatory Visit: Payer: Self-pay

## 2019-01-12 DIAGNOSIS — B351 Tinea unguium: Secondary | ICD-10-CM

## 2019-01-12 DIAGNOSIS — M79676 Pain in unspecified toe(s): Secondary | ICD-10-CM | POA: Diagnosis not present

## 2019-01-12 DIAGNOSIS — E1149 Type 2 diabetes mellitus with other diabetic neurological complication: Secondary | ICD-10-CM | POA: Diagnosis not present

## 2019-01-12 DIAGNOSIS — R21 Rash and other nonspecific skin eruption: Secondary | ICD-10-CM

## 2019-01-12 MED ORDER — CLOTRIMAZOLE-BETAMETHASONE 1-0.05 % EX CREA: 1.0000 "application " | TOPICAL_CREAM | Freq: Two times a day (BID) | CUTANEOUS | 0 refills | Status: DC

## 2019-01-12 MED FILL — CLOTRIMAZOLE-BETAMETHASONE: 1-0.05 | 14 days supply | Qty: 30 | Fill #0

## 2019-01-12 NOTE — Progress Notes (Signed)
Subjective: 73 y.o. returns the office today for painful, elongated, thickened toenails which he cannot trim himself. Denies any redness or drainage around the nails.  He states the rashes come back.  Does not itch or cause problems because of neuropathy.  The rash did clear with previous treatment.  He has no new concerns. Denies any systemic complaints such as fevers, chills, nausea, vomiting.   PCP: Mosie Lukes, MD  Endocrinologist: Dr. Benjiman Core, MD   Objective: AAO 3, NAD DP/PT pulses palpable, CRT less than 3 seconds Sensation is mildly decreased with SWM but overall no change.  Neuropathy symptoms are unchanged. Nails hypertrophic, dystrophic, elongated, brittle, discolored 10. There is tenderness overlying the nails 1-5 bilaterally. There is no surrounding erythema or drainage along the nail sites. Scaly, erythematous rash present the toes.  No open sores no drainage or skin fissures. No pain with calf compression, swelling, warmth, erythema.  Assessment: Patient presents with symptomatic onychomycosis; skin rash  Plan: -Treatment options including alternatives, risks, complications were discussed -Nails sharply debrided 10 without complication/bleeding. -Prescribed Lotrisone cream as this was helpful before. -Continue to monitor neuropathy -Discussed daily foot inspection. If there are any changes, to call the office immediately.  -Follow-up in 3 months or sooner if any problems are to arise. In the meantime, encouraged to call the office with any questions, concerns, changes symptoms.  Celesta Gentile, DPM

## 2019-01-30 DIAGNOSIS — G4733 Obstructive sleep apnea (adult) (pediatric): Secondary | ICD-10-CM | POA: Diagnosis not present

## 2019-02-15 ENCOUNTER — Other Ambulatory Visit: Payer: Self-pay

## 2019-02-15 ENCOUNTER — Encounter: Payer: Self-pay | Admitting: Pulmonary Disease

## 2019-02-15 ENCOUNTER — Ambulatory Visit (INDEPENDENT_AMBULATORY_CARE_PROVIDER_SITE_OTHER): Payer: Medicare HMO | Admitting: Pulmonary Disease

## 2019-02-15 VITALS — BP 136/64 | HR 58 | Ht 69.0 in | Wt 257.0 lb

## 2019-02-15 DIAGNOSIS — Z9989 Dependence on other enabling machines and devices: Secondary | ICD-10-CM

## 2019-02-15 DIAGNOSIS — G4733 Obstructive sleep apnea (adult) (pediatric): Secondary | ICD-10-CM

## 2019-02-15 NOTE — Progress Notes (Signed)
Subjective:    Patient ID: Michael Cunningham, male    DOB: 1945/10/27, 73 y.o.   MRN: [id number]  Patient with obstructive sleep apnea diagnosed in 2017  Has been using CPAP since 2017 Good improvement in symptoms since he started CPAP use  Usually goes to bed about 10 PM 20 to 30 minutes sleep onset About 2 awakenings Final awakening time about 6-8 a.m.  Comorbidities include hypertension, coronary artery disease, diabetes, hypercholesterolemia  Sleeps well Functions well  Very active, 30 minutes of exercise regularly  Past Medical History:  Diagnosis Date  . CAD (coronary artery disease)    a. 05/2014 [place] s/p DES x4 to mid-distLAD and DESx1 to DI  . Complication of anesthesia    11'12 Surgery with excrutiating "head about to explode" upon awakening  -lasted several hours postop  ( SURGERY DONE 08-13-2011 AT [facility] PT DID OK POST OP)  . Diverticulosis of colon    LEFT SIDE  . Exposure to Agent [facility] 10/18/2016   [place]  . History of small bowel obstruction    X2  28 YRS AGO AND LAST ONE 04/2014--  RESOLVED WITHOUT SURGICAL INTERVENTION  . Hyperlipidemia   . Hypertension   . Knee pain, bilateral 08/13/2011  . Left ureteral calculus   . Multiple lipomas    hx. mulitple and some remains -arms, legs  . Obesity, unspecified 09/18/2013  . Obstructive sleep apnea 01/01/2016  . Recurrent kidney stones 07/08/2013   Follows with Dr Gaynelle Arabian He believes they are Calcium oxalate stones  . Thrombocytopenia ([place]) 07/07/2014  . Type 2 diabetes mellitus ([place])   . Vitamin D deficiency 07/02/2014   Social History   Socioeconomic History  . Marital status: Married    Spouse name: Not on file  . Number of children: 2  . Years of education: Not on file  . Highest education level: Not on file  Occupational History  . Not on file  Social Needs  . Financial resource strain: Not on file  . Food insecurity    Worry: Not on file    Inability: Not on file  . Transportation needs    Medical:  Not on file    Non-medical: Not on file  Tobacco Use  . Smoking status: Former Smoker    Packs/day: 1.00    Years: 4.00    Pack years: 4.00    Types: Cigarettes    Quit date: 06/14/1967    Years since quitting: 51.7  . Smokeless tobacco: Never Used  Substance and Sexual Activity  . Alcohol use: No    Alcohol/week: 0.0 standard drinks  . Drug use: No  . Sexual activity: Yes    Comment: lives with wife  Lifestyle  . Physical activity    Days per week: Not on file    Minutes per session: Not on file  . Stress: Not on file  Relationships  . Social Herbalist on phone: Not on file    Gets together: Not on file    Attends religious service: Not on file    Active member of club or organization: Not on file    Attends meetings of clubs or organizations: Not on file    Relationship status: Not on file  . Intimate partner violence    Fear of current or ex partner: Not on file    Emotionally abused: Not on file    Physically abused: Not on file    Forced sexual activity: Not on file  Other Topics Concern  . 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     Family History  Problem Relation Age of Onset  . Hypertension Mother   . Cancer Mother 10       ovarian  . Heart disease Father        CHF  . Heart attack Father 70  . Cancer Sister        breast- just finished her last radiation  . Heart disease Maternal Grandfather        ?  [name] Heart attack Paternal Grandfather 54  . Heart attack Paternal Uncle 36       Sudden death  . Diabetes Neg Hx    Review of Systems  Constitutional: Negative for fever and unexpected weight change.  HENT: Negative for congestion, dental problem, ear pain, nosebleeds, postnasal drip, rhinorrhea, sinus pressure, sneezing, sore throat and trouble swallowing.   Eyes: Negative for redness and itching.  Respiratory: Negative for cough, chest tightness, shortness of breath and wheezing.    Cardiovascular: Negative for palpitations and leg swelling.  Gastrointestinal: Negative for nausea and vomiting.  Genitourinary: Negative for dysuria.  Musculoskeletal: Negative for joint swelling.  Skin: Negative for rash.  Allergic/Immunologic: Negative.  Negative for environmental allergies, food allergies and immunocompromised state.  Neurological: Negative for headaches.  Hematological: Does not bruise/bleed easily.  Psychiatric/Behavioral: Negative for dysphoric mood. The patient is not nervous/anxious.       Objective:   Physical Exam Constitutional:      Appearance: He is obese.  HENT:     Head: Normocephalic.     Nose: No congestion.     Mouth/Throat:     Mouth: Mucous membranes are moist.     Comments: Mallampati 3, crowded oropharynx Eyes:     Extraocular Movements: Extraocular movements intact.     Pupils: Pupils are equal, round, and reactive to light.  Neck:     Musculoskeletal: Normal range of motion. No neck rigidity or muscular tenderness.  Cardiovascular:     Rate and Rhythm: Normal rate and regular rhythm.     Pulses: Normal pulses.     Heart sounds: No murmur.  Pulmonary:     Effort: Pulmonary effort is normal. No respiratory distress.     Breath sounds: Normal breath sounds. No stridor. No wheezing or rhonchi.  Musculoskeletal:        General: No swelling.  Skin:    General: Skin is warm.  Neurological:     General: No focal deficit present.     Mental Status: He is alert.  Psychiatric:        Mood and Affect: Mood normal.    Vitals:   02/15/19 0935  BP: 136/64  Pulse: (!) 58  SpO2: 96%   Results of the Epworth flowsheet 02/15/2019  Sitting and reading 1  Watching TV 1  Sitting, inactive in a public place (e.g. a theatre or a meeting) 0  As a passenger in a car for an hour without a break 0  Lying down to rest in the afternoon when circumstances permit 0  Sitting and talking to someone 0  Sitting quietly after a lunch without alcohol 0   In a car, while stopped for a few minutes in traffic 0  Total score 2   Download not available  Sleep study report reviewed in epic    [place]:  .  Obstructive sleep apnea -Adequately treated with CPAP  therapy -Improvement in symptoms  .  Obesity -Encouraged about weight loss efforts -He already stays very active -Discussed exercise and diet  Coronary artery disease Status post stenting in the past -Stable  Plan: We will continue CPAP therapy  Encouraged to continue weight loss efforts  I will see him back in the office in about 3 months  Encouraged to call with any significant concerns

## 2019-02-15 NOTE — Patient Instructions (Signed)
Severe obstructive sleep apnea/good control of symptoms  We will obtain a download from the machine  Continue using CPAP as is  Call with significant concerns  Continue weight loss efforts/regular exercises

## 2019-02-15 NOTE — Addendum Note (Signed)
Addended by: Elton Sin on: 02/15/2019 04:53 PM   Modules accepted: Orders

## 2019-02-15 NOTE — Telephone Encounter (Addendum)
Fax received with 30 day download. Compliance 100%. Download given to Dr. Ander Slade. Message sent to Patient stating fax received.

## 2019-02-15 NOTE — Telephone Encounter (Signed)
Lattie Haw,  Patient emailed the office stating that he called [employer] and was told that they will send the download.

## 2019-03-01 DIAGNOSIS — G4733 Obstructive sleep apnea (adult) (pediatric): Secondary | ICD-10-CM | POA: Diagnosis not present

## 2019-03-05 ENCOUNTER — Encounter: Payer: Self-pay | Admitting: Family Medicine

## 2019-03-06 ENCOUNTER — Other Ambulatory Visit: Payer: Self-pay

## 2019-03-06 ENCOUNTER — Ambulatory Visit (INDEPENDENT_AMBULATORY_CARE_PROVIDER_SITE_OTHER): Payer: Medicare HMO | Admitting: Family Medicine

## 2019-03-06 ENCOUNTER — Telehealth: Payer: Self-pay | Admitting: *Deleted

## 2019-03-06 DIAGNOSIS — E1142 Type 2 diabetes mellitus with diabetic polyneuropathy: Secondary | ICD-10-CM

## 2019-03-06 DIAGNOSIS — E559 Vitamin D deficiency, unspecified: Secondary | ICD-10-CM

## 2019-03-06 DIAGNOSIS — E782 Mixed hyperlipidemia: Secondary | ICD-10-CM

## 2019-03-06 DIAGNOSIS — I10 Essential (primary) hypertension: Secondary | ICD-10-CM | POA: Diagnosis not present

## 2019-03-06 NOTE — Telephone Encounter (Signed)
Per Dr. Charlett Blake follow up in 3 months from ov today.

## 2019-03-07 NOTE — Assessment & Plan Note (Signed)
Tolerating statin, encouraged heart healthy diet, avoid trans fats, minimize simple carbs and saturated fats. Increase exercise as tolerated 

## 2019-03-07 NOTE — Progress Notes (Signed)
Virtual Visit via Video Note  I connected with Michael Cunningham. on 03/06/19 at  9:00 AM EST by a video enabled telemedicine application and verified that I am speaking with the correct person using two identifiers.  Location: Patient: home Provider: home   I discussed the limitations of evaluation and management by telemedicine and the availability of in person appointments. The patient expressed understanding and agreed to proceed. Magdalene Molly, CMA was able to get the patient set up on a visit, video   Subjective:    Patient ID: Michael Cunningham., male    DOB: [birth year], 73 y.o.   MRN: [id number]  Chief Complaint  Patient presents with  . 3 month follow up    mychart message sent to you with bp numbers    HPI Patient is in today for follow up on chronic medical concerns including hypertension, hyperlipidemia and diabetes. No recent febrile illness or hospitalizations. No polyuria or poly dipsia. His sugars have been well controlled. He is accompanied by his wife and deny any acute concerns. Denies CP/palp/SOB/HA/congestion/fevers/GI or GU c/o. Taking meds as prescribed  Past Medical History:  Diagnosis Date  . CAD (coronary artery disease)    a. 05/2014 [place] s/p DES x4 to mid-distLAD and DESx1 to DI  . Complication of anesthesia    11'12 Surgery with excrutiating "head about to explode" upon awakening  -lasted several hours postop  ( SURGERY DONE 08-13-2011 AT [facility] PT DID OK POST OP)  . Diverticulosis of colon    LEFT SIDE  . Exposure to Agent [facility] 10/18/2016   [place]  . History of small bowel obstruction    X2  28 YRS AGO AND LAST ONE 04/2014--  RESOLVED WITHOUT SURGICAL INTERVENTION  . Hyperlipidemia   . Hypertension   . Knee pain, bilateral 08/13/2011  . Left ureteral calculus   . Multiple lipomas    hx. mulitple and some remains -arms, legs  . Obesity, unspecified 09/18/2013  . Obstructive sleep apnea 01/01/2016  . Recurrent kidney stones 07/08/2013   Follows with Dr  Gaynelle Arabian He believes they are Calcium oxalate stones  . Thrombocytopenia ([place]) 07/07/2014  . Type 2 diabetes mellitus ([place])   . Vitamin D deficiency 07/02/2014    Past Surgical History:  Procedure Laterality Date  . COLONOSCOPY W/ POLYPECTOMY  LAST ONE 2013  . CORONARY STENT PLACEMENT  05/17/2014   PROXIMAL X 4  MID & DISTAL LAD  DIAGONAL   . KNEE ARTHROSCOPY  08/13/2011   right ARTHROSCOPY KNEE;  Surgeon: Gearlean Alf, MD;  Location: [facility];  Service: Orthopedics;  Laterality: Right;  medial  meniscal debridement, excision of plica, synovectomy  . KNEE ARTHROSCOPY Left 11/16/2013   Procedure: LEFT KNEE ARTHROSCOPY MEDIAL MENISCAL DEBRIDEMENT, CHONDROPLASTY;  Surgeon: Gearlean Alf, MD;  Location: [facility];  Service: Orthopedics;  Laterality: Left;  . KNEE ARTHROSCOPY W/ MENISCECTOMY Right 01-29-2011   twice, first trimmed mediscus and cyst  . LEFT HEART CATHETERIZATION WITH CORONARY ANGIOGRAM N/A 05/17/2014   Procedure: LEFT HEART CATHETERIZATION WITH CORONARY ANGIOGRAM;  Surgeon: Burnell Blanks, MD;  Location: [facility] CATH LAB;  Service: Cardiovascular;  Laterality: N/A;  . [place]   multiple, 10-12  . PERCUTANEOUS CORONARY STENT INTERVENTION (PCI-S)  05/17/2014   Procedure: PERCUTANEOUS CORONARY STENT INTERVENTION (PCI-S);  Surgeon: Burnell Blanks, MD;  Location: [facility] CATH LAB;  Service: Cardiovascular;;  Diag1 and Prox to Distal LAD    Family History  Problem Relation Age of  Onset  . Hypertension Mother   . Cancer Mother 68       ovarian  . Heart disease Father        CHF  . Heart attack Father 42  . Cancer Sister        breast- just finished her last radiation  . Heart disease Maternal Grandfather        ?  [name] Heart attack Paternal Grandfather 58  . Heart attack Paternal Uncle 74       Sudden death  . Diabetes Neg Hx     Social History   Socioeconomic History  . Marital status: Married    Spouse name: Not on file  . Number of children: 2  .  Years of education: Not on file  . Highest education level: Not on file  Occupational History  . Not on file  Tobacco Use  . Smoking status: Former Smoker    Packs/day: 1.00    Years: 4.00    Pack years: 4.00    Types: Cigarettes    Quit date: 06/14/1967    Years since quitting: 51.7  . Smokeless tobacco: Never Used  Substance and Sexual Activity  . Alcohol use: No    Alcohol/week: 0.0 standard drinks  . Drug use: No  . Sexual activity: Yes    Comment: lives with wife  Other Topics Concern  . 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     Social Determinants of Health   Financial Resource Strain:   . Difficulty of Paying Living Expenses: Not on file  Food Insecurity:   . Worried About Charity fundraiser in the Last Year: Not on file  . Ran Out of Food in the Last Year: Not on file  Transportation Needs:   . Lack of Transportation (Medical): Not on file  . Lack of Transportation (Non-Medical): Not on file  Physical Activity:   . Days of Exercise per Week: Not on file  . Minutes of Exercise per Session: Not on file  Stress:   . Feeling of Stress : Not on file  Social Connections:   . Frequency of Communication with Friends and Family: Not on file  . Frequency of Social Gatherings with Friends and Family: Not on file  . Attends Religious Services: Not on file  . Active Member of Clubs or Organizations: Not on file  . Attends Archivist Meetings: Not on file  . Marital Status: Not on file  Intimate Partner Violence:   . Fear of Current or Ex-Partner: Not on file  . Emotionally Abused: Not on file  . Physically Abused: Not on file  . Sexually Abused: Not on file    Outpatient Medications Prior to Visit  Medication Sig Dispense Refill  . ACCU-CHEK FASTCLIX LANCETS MISC Use to test blood sugar 2 times daily. Dx: E11.40 204 each 3  . acetaminophen (TYLENOL) 500 MG tablet Take 1,000 mg by mouth every 6 (six) hours  as needed for mild pain. Reported on 03/31/2015    . aspirin EC 81 MG tablet Take 81 mg by mouth daily with breakfast.     . Cholecalciferol (VITAMIN D PO) Take 5,000 Units by mouth daily.    . clotrimazole-betamethasone (LOTRISONE) cream Apply 1 application topically 2 (two) times daily. 30 g 0  . glucagon (GLUCAGON EMERGENCY) 1 MG injection Inject 1 mg into the muscle once as needed for up to 1 dose. 1 each  12  . glucose blood (ACCU-CHEK AVIVA PLUS) test strip Use to test blood sugar 2 times daily. Dx: E11.40 200 each 3  . insulin NPH Human (HUMULIN N) 100 UNIT/ML injection INJECT UNDER SKIN 20 UNITS IN THE AM AND 44 UNITS AT BEDTIME. 30 mL 3  . insulin regular (HUMULIN R) 100 units/mL injection Inject 0.2-0.35 mLs (20-35 Units total) into the skin 3 (three) times daily before meals. 30 mL 3  . Insulin Syringe-Needle U-100 (TRUEPLUS INSULIN SYRINGE) 30G X 5/16" 0.5 ML MISC USE TO INJECT INSULIN 5 TIMES DAILY. 200 each 3  . losartan (COZAAR) 50 MG tablet TAKE 1&1/2 (75 MG TOTAL) TABLETS BY MOUTH EVERY MORNING (Patient taking differently: Take 25 mg by mouth daily. TAKE 3- 25 MG TABLETS BY MOUTH EVERY MORNING) 180 tablet 3  . metoprolol tartrate (LOPRESSOR) 25 MG tablet Take 1 tablet (25 mg total) by mouth 2 (two) times daily. 180 tablet 3  . Multiple Vitamin (MULTIVITAMIN WITH MINERALS) TABS tablet Take 1 tablet by mouth daily.    . nitroGLYCERIN (NITROSTAT) 0.4 MG SL tablet Place 1 tablet (0.4 mg total) under the tongue every 5 (five) minutes as needed for chest pain. 25 tablet 3  . Polyethylene Glycol 3350 (MIRALAX PO) Take by mouth as needed. Reported on 03/31/2015    . prasugrel (EFFIENT) 10 MG TABS tablet Take 1 tablet (10 mg total) by mouth daily. 90 tablet 3  . Probiotic Product (PROBIOTIC-10 PO) Take 1 tablet by mouth daily.    . simvastatin (ZOCOR) 40 MG tablet TAKE 1 TABLET (40 MG) BY MOUTH AT BEDTIME. 90 tablet 2   No facility-administered medications prior to visit.    Allergies    Allergen Reactions  . Ciprofloxacin Other (See Comments)    Abdominal pain  . Lipitor [Atorvastatin Calcium] Other (See Comments)    Severe muscle pain  . Lisinopril Cough  . Metformin And Related Diarrhea    Review of Systems  Constitutional: Negative for fever and malaise/fatigue.  HENT: Negative for congestion.   Eyes: Negative for blurred vision.  Respiratory: Negative for shortness of breath.   Cardiovascular: Negative for chest pain, palpitations and leg swelling.  Gastrointestinal: Negative for abdominal pain, blood in stool and nausea.  Genitourinary: Negative for dysuria and frequency.  Musculoskeletal: Negative for falls.  Skin: Negative for rash.  Neurological: Negative for dizziness, loss of consciousness and headaches.  Endo/Heme/Allergies: Negative for environmental allergies.  Psychiatric/Behavioral: Negative for depression. The patient is not nervous/anxious.        Objective:    Physical Exam Constitutional:      Appearance: Normal appearance. He is not ill-appearing.  HENT:     Head: Normocephalic and atraumatic.     Nose: Nose normal.  Eyes:     General:        Right eye: No discharge.        Left eye: No discharge.  Pulmonary:     Effort: Pulmonary effort is normal.  Neurological:     Mental Status: He is alert and oriented to person, place, and time.  Psychiatric:        Mood and Affect: Mood normal.        Behavior: Behavior normal.     BP 128/68   Pulse (!) 51   SpO2 95%  Wt Readings from Last 3 Encounters:  02/15/19 257 lb (116.6 kg)  09/04/18 253 lb (114.8 kg)  03/31/18 263 lb (119.3 kg)    Diabetic Foot Exam - Simple  No data filed     Lab Results  Component Value Date   WBC 5.5 12/28/2018   HGB 14.9 12/28/2018   HCT 44.2 12/28/2018   PLT 152.0 12/28/2018   GLUCOSE 137 (H) 12/28/2018   CHOL 133 12/28/2018   TRIG 164.0 (H) 12/28/2018   HDL 33.90 (L) 12/28/2018   LDLDIRECT 108.0 04/16/2014   LDLCALC 67 12/28/2018   ALT  23 12/28/2018   AST 22 12/28/2018   NA 137 12/28/2018   K 4.3 12/28/2018   CL 102 12/28/2018   CREATININE 0.91 12/28/2018   BUN 16 12/28/2018   CO2 28 12/28/2018   TSH 2.55 12/28/2018   PSA 1.92 09/25/2018   INR 1.02 05/17/2014   HGBA1C 6.8 (H) 12/28/2018   MICROALBUR 1.7 06/24/2015    Lab Results  Component Value Date   TSH 2.55 12/28/2018   Lab Results  Component Value Date   WBC 5.5 12/28/2018   HGB 14.9 12/28/2018   HCT 44.2 12/28/2018   MCV 93.7 12/28/2018   PLT 152.0 12/28/2018   Lab Results  Component Value Date   NA 137 12/28/2018   K 4.3 12/28/2018   CO2 28 12/28/2018   GLUCOSE 137 (H) 12/28/2018   BUN 16 12/28/2018   CREATININE 0.91 12/28/2018   BILITOT 0.9 12/28/2018   ALKPHOS 84 12/28/2018   AST 22 12/28/2018   ALT 23 12/28/2018   PROT 7.1 12/28/2018   ALBUMIN 4.3 12/28/2018   CALCIUM 9.2 12/28/2018   ANIONGAP 8 05/18/2014   GFR 81.33 12/28/2018   Lab Results  Component Value Date   CHOL 133 12/28/2018   Lab Results  Component Value Date   HDL 33.90 (L) 12/28/2018   Lab Results  Component Value Date   LDLCALC 67 12/28/2018   Lab Results  Component Value Date   TRIG 164.0 (H) 12/28/2018   Lab Results  Component Value Date   CHOLHDL 4 12/28/2018   Lab Results  Component Value Date   HGBA1C 6.8 (H) 12/28/2018       Assessment & Plan:   Problem List Items Addressed This Visit    Hypertension (Chronic)    Monitor bp at home and report any concerns. no changes to meds. Encouraged heart healthy diet such as the DASH diet and exercise as tolerated.       Hyperlipidemia    Tolerating statin, encouraged heart healthy diet, avoid trans fats, minimize simple carbs and saturated fats. Increase exercise as tolerated      Vitamin D deficiency    Supplement and monitor      Well controlled type 2 diabetes mellitus with peripheral neuropathy (HCC)    hgba1c acceptable, minimize simple carbs. Increase exercise as tolerated. Continue  current meds         I am having Ramiro M. Pelot Brooke Bonito. maintain his aspirin EC, multivitamin with minerals, acetaminophen, Polyethylene Glycol 3350 (MIRALAX PO), losartan, metoprolol tartrate, Accu-Chek FastClix Lancets, glucose blood, simvastatin, Insulin Syringe-Needle U-100, Probiotic Product (PROBIOTIC-10 PO), Cholecalciferol (VITAMIN D PO), glucagon, insulin NPH Human, insulin regular, prasugrel, nitroGLYCERIN, and clotrimazole-betamethasone.  No orders of the defined types were placed in this encounter.    I discussed the assessment and treatment plan with the patient. The patient was provided an opportunity to ask questions and all were answered. The patient agreed with the plan and demonstrated an understanding of the instructions.   The patient was advised to call back or seek an in-person evaluation if the symptoms worsen or if the condition  fails to improve as anticipated.  I provided 25 minutes of non-face-to-face time during this encounter.   Penni Homans, MD

## 2019-03-07 NOTE — Assessment & Plan Note (Signed)
hgba1c acceptable, minimize simple carbs. Increase exercise as tolerated. Continue current meds 

## 2019-03-07 NOTE — Assessment & Plan Note (Signed)
Monitor bp at home and report any concerns. no changes to meds. Encouraged heart healthy diet such as the DASH diet and exercise as tolerated.  

## 2019-03-07 NOTE — Assessment & Plan Note (Signed)
Supplement and monitor 

## 2019-04-01 DIAGNOSIS — G4733 Obstructive sleep apnea (adult) (pediatric): Secondary | ICD-10-CM | POA: Diagnosis not present

## 2019-04-15 ENCOUNTER — Ambulatory Visit: Payer: Medicare HMO

## 2019-04-16 ENCOUNTER — Ambulatory Visit: Payer: Medicare HMO | Admitting: Podiatry

## 2019-04-19 ENCOUNTER — Ambulatory Visit: Payer: Medicare HMO | Attending: Family Medicine

## 2019-04-19 DIAGNOSIS — Z23 Encounter for immunization: Secondary | ICD-10-CM | POA: Insufficient documentation

## 2019-04-19 NOTE — Progress Notes (Signed)
   [id number]  Name:  [name].    MRN: [id number] DOB: 20-Aug-1945  04/19/2019  [name] was observed post Covid-19 immunization for 30 minutes based on pre-vaccination screening without incidence. He was provided with Vaccine Information Sheet and instruction to access the V-Safe system.   [name] was instructed to call 911 with any severe reactions post vaccine: [name] Difficulty breathing  . Swelling of your face and throat  . A fast heartbeat  . A bad rash all over your body  . Dizziness and weakness    Immunizations Administered    Name Date Dose VIS Date Route   Pfizer COVID-19 Vaccine 04/19/2019 11:11 AM 0.3 mL 02/23/2019 Intramuscular   Manufacturer: [place]   Lot: [id number]   [place]: [id number]

## 2019-04-20 ENCOUNTER — Ambulatory Visit: Payer: Medicare HMO

## 2019-04-23 ENCOUNTER — Encounter: Payer: Self-pay | Admitting: Podiatry

## 2019-04-23 ENCOUNTER — Ambulatory Visit: Payer: Medicare HMO | Admitting: Podiatry

## 2019-04-23 ENCOUNTER — Other Ambulatory Visit: Payer: Self-pay

## 2019-04-23 DIAGNOSIS — B351 Tinea unguium: Secondary | ICD-10-CM | POA: Diagnosis not present

## 2019-04-23 DIAGNOSIS — E1149 Type 2 diabetes mellitus with other diabetic neurological complication: Secondary | ICD-10-CM

## 2019-04-23 DIAGNOSIS — M79676 Pain in unspecified toe(s): Secondary | ICD-10-CM | POA: Diagnosis not present

## 2019-04-24 NOTE — Progress Notes (Signed)
Subjective: 74 y.o. returns the office today for painful, elongated, thickened toenails which he cannot trim himself. Denies any redness or drainage around the nails.  He was using the Lotrisone cream some but stopped once the rashes improved.  He has no new concerns. Denies any systemic complaints such as fevers, chills, nausea, vomiting.   PCP: Mosie Lukes, MD  Endocrinologist: Dr. Benjiman Core, MD   Objective: AAO 3, NAD DP/PT pulses palpable, CRT less than 3 seconds Sensation is mildly decreased with SWM but overall no change.  Neuropathy symptoms are unchanged. Nails hypertrophic, dystrophic, elongated, brittle, discolored 10. There is tenderness overlying the nails 1-5 bilaterally. There is no surrounding erythema or drainage along the nail sites. Scaly, erythematous rash present the toes.  No open sores no drainage or skin fissures. No pain with calf compression, swelling, warmth, erythema.  Assessment: Patient presents with symptomatic onychomycosis; skin rash  Plan: -Treatment options including alternatives, risks, complications were discussed -Nails sharply debrided 10 without complication/bleeding. -Continue to monitor neuropathy -Discussed daily foot inspection. If there are any changes, to call the office immediately.  -Follow-up in 3 months or sooner if any problems are to arise. In the meantime, encouraged to call the office with any questions, concerns, changes symptoms.  Celesta Gentile, DPM

## 2019-05-01 DIAGNOSIS — D3131 Benign neoplasm of right choroid: Secondary | ICD-10-CM | POA: Diagnosis not present

## 2019-05-10 ENCOUNTER — Encounter: Payer: Self-pay | Admitting: Cardiology

## 2019-05-13 ENCOUNTER — Encounter: Payer: Self-pay | Admitting: Family Medicine

## 2019-05-14 ENCOUNTER — Ambulatory Visit: Payer: Medicare HMO | Attending: Internal Medicine

## 2019-05-14 ENCOUNTER — Encounter: Payer: Self-pay | Admitting: Family Medicine

## 2019-05-14 DIAGNOSIS — Z23 Encounter for immunization: Secondary | ICD-10-CM | POA: Insufficient documentation

## 2019-05-14 NOTE — Progress Notes (Signed)
   [id number]  Name:  Michael Cunningham.    MRN: [id number] DOB: 09-12-1945  05/14/2019  Michael Cunningham was observed post Covid-19 immunization for 15 minutes without incidence. He was provided with Vaccine Information Sheet and instruction to access the V-Safe system.   Michael Cunningham was instructed to call 911 with any severe reactions post vaccine: [name] Difficulty breathing  . Swelling of your face and throat  . A fast heartbeat  . A bad rash all over your body  . Dizziness and weakness    Immunizations Administered    Name Date Dose VIS Date Route   Pfizer COVID-19 Vaccine 05/14/2019  2:53 PM 0.3 mL 02/23/2019 Intramuscular   Manufacturer: [place]   Lot: [id number]   [place]: [id number]

## 2019-06-08 ENCOUNTER — Ambulatory Visit: Payer: Medicare HMO | Admitting: Family Medicine

## 2019-06-11 ENCOUNTER — Ambulatory Visit (INDEPENDENT_AMBULATORY_CARE_PROVIDER_SITE_OTHER): Payer: Medicare HMO | Admitting: Family Medicine

## 2019-06-11 ENCOUNTER — Other Ambulatory Visit: Payer: Self-pay

## 2019-06-11 DIAGNOSIS — E782 Mixed hyperlipidemia: Secondary | ICD-10-CM

## 2019-06-11 DIAGNOSIS — E1142 Type 2 diabetes mellitus with diabetic polyneuropathy: Secondary | ICD-10-CM

## 2019-06-11 DIAGNOSIS — I10 Essential (primary) hypertension: Secondary | ICD-10-CM | POA: Diagnosis not present

## 2019-06-11 DIAGNOSIS — E559 Vitamin D deficiency, unspecified: Secondary | ICD-10-CM

## 2019-06-11 NOTE — Patient Instructions (Signed)
[employer] Blood Pressure cuff, upper arm, want BP 100-140/60-90 Pulse oximeter, want oxygen in 90s  Weekly vitals  Take Multivitamin with minerals, selenium Vitamin D 1000-2000 IU daily Probiotic with lactobacillus and bifidophilus Asprin EC 81 mg daily Fish or krill oil caps Melatonin 2-5 mg at bedtime  [email or web address] 

## 2019-06-13 NOTE — Assessment & Plan Note (Signed)
Encouraged heart healthy diet, increase exercise, avoid trans fats, consider a krill oil cap daily. Tolerating simvastatin 

## 2019-06-13 NOTE — Assessment & Plan Note (Addendum)
hgba1c acceptable, minimize simple carbs. Increase exercise as tolerated. Continue current meds. Labs from the [place] reviewed. Future labs ordered

## 2019-06-13 NOTE — Assessment & Plan Note (Signed)
Monitor and report any concerns, no changes to meds. Encouraged heart healthy diet such as the DASH diet and exercise as tolerated.  ?

## 2019-06-13 NOTE — Progress Notes (Signed)
Virtual Visit via Video Note  I connected with Michael Gavel. on 06/11/19 at 11:20 AM EDT by a video enabled telemedicine application and verified that I am speaking with the correct person using two identifiers.  Location: Patient: home Provider: office   I discussed the limitations of evaluation and management by telemedicine and the availability of in person appointments. The patient expressed understanding and agreed to proceed. Kem Boroughs, CMA was able to get the patient set up on a video visit   Subjective:    Patient ID: Michael Gavel., male    DOB: 03/27/1945, 74 y.o.   MRN: [id number]  Chief Complaint  Patient presents with  . Follow-up    3 month follow up, no complaints     HPI Patient is in today for follow up on chronic medical concerns. He is at home with his wife and they report he is doing well. He had his second COVID shot on 05/14/19 and has had some mild trouble with feeling dizzy since then but it has improved. He has increased his insulin to 20-25 in am and 30 for the the other 2 doses and his sugar have been ranging from 80-130. Denies CP/palp/SOB/HA/congestion/fevers/GI or GU c/o. Taking meds as prescribed  Past Medical History:  Diagnosis Date  . CAD (coronary artery disease)    a. 05/2014 [place] s/p DES x4 to mid-distLAD and DESx1 to DI  . Complication of anesthesia    11'12 Surgery with excrutiating "head about to explode" upon awakening  -lasted several hours postop  ( SURGERY DONE 08-13-2011 AT [facility] PT DID OK POST OP)  . Diverticulosis of colon    LEFT SIDE  . Exposure to Agent [facility] 10/18/2016   [place]  . History of small bowel obstruction    X2  28 YRS AGO AND LAST ONE 04/2014--  RESOLVED WITHOUT SURGICAL INTERVENTION  . Hyperlipidemia   . Hypertension   . Knee pain, bilateral 08/13/2011  . Left ureteral calculus   . Multiple lipomas    hx. mulitple and some remains -arms, legs  . Obesity, unspecified 09/18/2013  . Obstructive sleep apnea  01/01/2016  . Recurrent kidney stones 07/08/2013   Follows with Dr Gaynelle Arabian He believes they are Calcium oxalate stones  . Thrombocytopenia ([place]) 07/07/2014  . Type 2 diabetes mellitus ([place])   . Vitamin D deficiency 07/02/2014    Past Surgical History:  Procedure Laterality Date  . COLONOSCOPY W/ POLYPECTOMY  LAST ONE 2013  . CORONARY STENT PLACEMENT  05/17/2014   PROXIMAL X 4  MID & DISTAL LAD  DIAGONAL   . KNEE ARTHROSCOPY  08/13/2011   right ARTHROSCOPY KNEE;  Surgeon: Gearlean Alf, MD;  Location: [facility];  Service: Orthopedics;  Laterality: Right;  medial  meniscal debridement, excision of plica, synovectomy  . KNEE ARTHROSCOPY Left 11/16/2013   Procedure: LEFT KNEE ARTHROSCOPY MEDIAL MENISCAL DEBRIDEMENT, CHONDROPLASTY;  Surgeon: Gearlean Alf, MD;  Location: [facility];  Service: Orthopedics;  Laterality: Left;  . KNEE ARTHROSCOPY W/ MENISCECTOMY Right 01-29-2011   twice, first trimmed mediscus and cyst  . LEFT HEART CATHETERIZATION WITH CORONARY ANGIOGRAM N/A 05/17/2014   Procedure: LEFT HEART CATHETERIZATION WITH CORONARY ANGIOGRAM;  Surgeon: Burnell Blanks, MD;  [facility]: [facility] CATH LAB;  Service: Cardiovascular;  Laterality: N/A;  . [place]   multiple, 10-12  . PERCUTANEOUS CORONARY STENT INTERVENTION (PCI-S)  05/17/2014   Procedure: PERCUTANEOUS CORONARY STENT INTERVENTION (PCI-S);  Surgeon: Burnell Blanks, MD;  Location: [facility] CATH LAB;  Service: Cardiovascular;;  Diag1 and Prox to Distal LAD    Family History  Problem Relation Age of Onset  . Hypertension Mother   . Cancer Mother 64       ovarian  . Heart disease Father        CHF  . Heart attack Father 51  . Cancer Sister        breast- just finished her last radiation  . Heart disease Maternal Grandfather        ?  [name] Heart attack Paternal Grandfather 13  . Heart attack Paternal Uncle 86       Sudden death  . Diabetes Neg Hx     Social History   Socioeconomic History  . Marital status:  Married    Spouse name: Not on file  . Number of children: 2  . Years of education: Not on file  . Highest education level: Not on file  Occupational History  . Not on file  Tobacco Use  . Smoking status: Former Smoker    Packs/day: 1.00    Years: 4.00    Pack years: 4.00    Types: Cigarettes    Quit date: 06/14/1967    Years since quitting: 52.0  . Smokeless tobacco: Never Used  Substance and Sexual Activity  . Alcohol use: No    Alcohol/week: 0.0 standard drinks  . Drug use: No  . Sexual activity: Yes    Comment: lives with wife  Other Topics Concern  . 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     Social Determinants of Health   Financial Resource Strain:   . Difficulty of Paying Living Expenses:   Food Insecurity:   . Worried About Charity fundraiser in the Last Year:   . Arboriculturist in the Last Year:   Transportation Needs:   . Film/video editor (Medical):   [name] Lack of Transportation (Non-Medical):   Physical Activity:   . Days of Exercise per Week:   . Minutes of Exercise per Session:   Stress:   . Feeling of Stress :   Social Connections:   . Frequency of Communication with Friends and Family:   . Frequency of Social Gatherings with Friends and Family:   . Attends Religious Services:   . Active Member of Clubs or Organizations:   . Attends Archivist Meetings:   [name] Marital Status:   Intimate Partner Violence:   . Fear of Current or Ex-Partner:   . Emotionally Abused:   [name] Physically Abused:   . Sexually Abused:     Outpatient Medications Prior to Visit  Medication Sig Dispense Refill  . ACCU-CHEK FASTCLIX LANCETS MISC Use to test blood sugar 2 times daily. Dx: E11.40 204 each 3  . acetaminophen (TYLENOL) 500 MG tablet Take 1,000 mg by mouth every 6 (six) hours as needed for mild pain. Reported on 03/31/2015    . aspirin EC 81 MG tablet Take 81 mg by mouth daily with breakfast.     .  Cholecalciferol (VITAMIN D PO) Take 5,000 Units by mouth daily.    . clotrimazole-betamethasone (LOTRISONE) cream Apply 1 application topically 2 (two) times daily. (Patient not taking: Reported on 06/11/2019) 30 g 0  . glucagon (GLUCAGON EMERGENCY) 1 MG injection Inject 1 mg into the muscle once as needed for up to 1 dose. 1 each 12  . glucose blood (ACCU-CHEK  AVIVA PLUS) test strip Use to test blood sugar 2 times daily. Dx: E11.40 200 each 3  . insulin NPH Human (HUMULIN N) 100 UNIT/ML injection INJECT UNDER SKIN 20 UNITS IN THE AM AND 44 UNITS AT BEDTIME. 30 mL 3  . insulin regular (HUMULIN R) 100 units/mL injection Inject 0.2-0.35 mLs (20-35 Units total) into the skin 3 (three) times daily before meals. 30 mL 3  . Insulin Syringe-Needle U-100 (TRUEPLUS INSULIN SYRINGE) 30G X 5/16" 0.5 ML MISC USE TO INJECT INSULIN 5 TIMES DAILY. 200 each 3  . losartan (COZAAR) 50 MG tablet TAKE 1&1/2 (75 MG TOTAL) TABLETS BY MOUTH EVERY MORNING (Patient taking differently: Take 25 mg by mouth daily. TAKE 3- 25 MG TABLETS BY MOUTH EVERY MORNING) 180 tablet 3  . metoprolol tartrate (LOPRESSOR) 25 MG tablet Take 1 tablet (25 mg total) by mouth 2 (two) times daily. 180 tablet 3  . Multiple Vitamin (MULTIVITAMIN WITH MINERALS) TABS tablet Take 1 tablet by mouth daily.    . nitroGLYCERIN (NITROSTAT) 0.4 MG SL tablet Place 1 tablet (0.4 mg total) under the tongue every 5 (five) minutes as needed for chest pain. 25 tablet 3  . Polyethylene Glycol 3350 (MIRALAX PO) Take by mouth as needed. Reported on 03/31/2015    . prasugrel (EFFIENT) 10 MG TABS tablet Take 1 tablet (10 mg total) by mouth daily. 90 tablet 3  . Probiotic Product (PROBIOTIC-10 PO) Take 1 tablet by mouth daily.    . simvastatin (ZOCOR) 40 MG tablet TAKE 1 TABLET (40 MG) BY MOUTH AT BEDTIME. 90 tablet 2   No facility-administered medications prior to visit.    Allergies  Allergen Reactions  . Ciprofloxacin Other (See Comments)    Abdominal pain  .  Lipitor [Atorvastatin Calcium] Other (See Comments)    Severe muscle pain  . Lisinopril Cough  . Metformin And Related Diarrhea    Review of Systems  Constitutional: Negative for fever and malaise/fatigue.  HENT: Negative for congestion.   Eyes: Negative for blurred vision.  Respiratory: Negative for shortness of breath.   Cardiovascular: Negative for chest pain, palpitations and leg swelling.  Gastrointestinal: Negative for abdominal pain, blood in stool and nausea.  Genitourinary: Negative for dysuria and frequency.  Musculoskeletal: Negative for falls.  Skin: Negative for rash.  Neurological: Negative for dizziness, loss of consciousness and headaches.  Endo/Heme/Allergies: Negative for environmental allergies.  Psychiatric/Behavioral: Negative for depression. The patient is not nervous/anxious.        Objective:    Physical Exam Constitutional:      Appearance: Normal appearance. He is not ill-appearing.  HENT:     Head: Normocephalic and atraumatic.     Right Ear: External ear normal.     Left Ear: External ear normal.     Nose: Nose normal.  Eyes:     General:        Right eye: No discharge.        Left eye: No discharge.  Pulmonary:     Effort: Pulmonary effort is normal.  Neurological:     Mental Status: He is alert and oriented to person, place, and time.  Psychiatric:        Behavior: Behavior normal.     There were no vitals taken for this visit. Wt Readings from Last 3 Encounters:  02/15/19 257 lb (116.6 kg)  09/04/18 253 lb (114.8 kg)  03/31/18 263 lb (119.3 kg)    Diabetic Foot Exam - Simple   No data filed  Lab Results  Component Value Date   WBC 5.5 12/28/2018   HGB 14.9 12/28/2018   HCT 44.2 12/28/2018   PLT 152.0 12/28/2018   GLUCOSE 137 (H) 12/28/2018   CHOL 133 12/28/2018   TRIG 164.0 (H) 12/28/2018   HDL 33.90 (L) 12/28/2018   LDLDIRECT 108.0 04/16/2014   LDLCALC 67 12/28/2018   ALT 23 12/28/2018   AST 22 12/28/2018   NA  137 12/28/2018   K 4.3 12/28/2018   CL 102 12/28/2018   CREATININE 0.91 12/28/2018   BUN 16 12/28/2018   CO2 28 12/28/2018   TSH 2.55 12/28/2018   PSA 1.92 09/25/2018   INR 1.02 05/17/2014   HGBA1C 6.8 (H) 12/28/2018   MICROALBUR 1.7 06/24/2015    Lab Results  Component Value Date   TSH 2.55 12/28/2018   Lab Results  Component Value Date   WBC 5.5 12/28/2018   HGB 14.9 12/28/2018   HCT 44.2 12/28/2018   MCV 93.7 12/28/2018   PLT 152.0 12/28/2018   Lab Results  Component Value Date   NA 137 12/28/2018   K 4.3 12/28/2018   CO2 28 12/28/2018   GLUCOSE 137 (H) 12/28/2018   BUN 16 12/28/2018   CREATININE 0.91 12/28/2018   BILITOT 0.9 12/28/2018   ALKPHOS 84 12/28/2018   AST 22 12/28/2018   ALT 23 12/28/2018   PROT 7.1 12/28/2018   ALBUMIN 4.3 12/28/2018   CALCIUM 9.2 12/28/2018   ANIONGAP 8 05/18/2014   GFR 81.33 12/28/2018   Lab Results  Component Value Date   CHOL 133 12/28/2018   Lab Results  Component Value Date   HDL 33.90 (L) 12/28/2018   Lab Results  Component Value Date   LDLCALC 67 12/28/2018   Lab Results  Component Value Date   TRIG 164.0 (H) 12/28/2018   Lab Results  Component Value Date   CHOLHDL 4 12/28/2018   Lab Results  Component Value Date   HGBA1C 6.8 (H) 12/28/2018       Assessment & Plan:   Problem List Items Addressed This Visit    Hypertension - Primary (Chronic)    Monitor and report any concerns, no changes to meds. Encouraged heart healthy diet such as the DASH diet and exercise as tolerated.       Relevant Orders   CBC   Comprehensive metabolic panel   TSH   Hyperlipidemia    Encouraged heart healthy diet, increase exercise, avoid trans fats, consider a krill oil cap daily. Tolerating simvastatin      Relevant Orders   Lipid panel   Vitamin D deficiency    Supplement and monitor      Relevant Orders   VITAMIN D 25 Hydroxy (Vit-D Deficiency, Fractures)   Well controlled type 2 diabetes mellitus with  peripheral neuropathy (HCC)    hgba1c acceptable, minimize simple carbs. Increase exercise as tolerated. Continue current meds. Labs from the [place] reviewed. Future labs ordered      Relevant Orders   Hemoglobin A1c      I am having Michael Cunningham. maintain his aspirin EC, multivitamin with minerals, acetaminophen, Polyethylene Glycol 3350 (MIRALAX PO), losartan, metoprolol tartrate, Accu-Chek FastClix Lancets, glucose blood, simvastatin, Insulin Syringe-Needle U-100, Probiotic Product (PROBIOTIC-10 PO), Cholecalciferol (VITAMIN D PO), glucagon, insulin NPH Human, insulin regular, prasugrel, nitroGLYCERIN, and clotrimazole-betamethasone.  No orders of the defined types were placed in this encounter.    I discussed the assessment and treatment plan with the patient. The patient was provided an opportunity  to ask questions and all were answered. The patient agreed with the plan and demonstrated an understanding of the instructions.   The patient was advised to call back or seek an in-person evaluation if the symptoms worsen or if the condition fails to improve as anticipated.  I provided 25 minutes of non-face-to-face time during this encounter.   Penni Homans, MD

## 2019-06-13 NOTE — Assessment & Plan Note (Signed)
Supplement and monitor 

## 2019-07-09 ENCOUNTER — Ambulatory Visit: Payer: Medicare HMO | Admitting: Podiatry

## 2019-07-10 ENCOUNTER — Ambulatory Visit: Payer: Medicare HMO | Admitting: Podiatry

## 2019-07-10 ENCOUNTER — Encounter: Payer: Self-pay | Admitting: Podiatry

## 2019-07-10 ENCOUNTER — Other Ambulatory Visit: Payer: Self-pay

## 2019-07-10 VITALS — Temp 97.1°F

## 2019-07-10 DIAGNOSIS — D689 Coagulation defect, unspecified: Secondary | ICD-10-CM

## 2019-07-10 DIAGNOSIS — E1149 Type 2 diabetes mellitus with other diabetic neurological complication: Secondary | ICD-10-CM

## 2019-07-10 DIAGNOSIS — B351 Tinea unguium: Secondary | ICD-10-CM

## 2019-07-10 DIAGNOSIS — M79676 Pain in unspecified toe(s): Secondary | ICD-10-CM | POA: Diagnosis not present

## 2019-07-10 NOTE — Progress Notes (Signed)
This patient returns to my office for at risk foot care.  This patient requires this care by a professional since this patient will be at risk due to having coagulation defect, diabetes and neuropathy.  Patient is taking effient.  This patient is unable to cut nails himself since the patient cannot reach his nails.These nails are painful walking and wearing shoes.  This patient presents for at risk foot care today.  General Appearance  Alert, conversant and in no acute stress.  Vascular  Dorsalis pedis and posterior tibial  pulses are palpable  bilaterally.  Capillary return is within normal limits  bilaterally. Temperature is within normal limits  bilaterally.  Neurologic  Senn-Weinstein monofilament wire test diminished  bilaterally. Muscle power within normal limits bilaterally.  Nails Thick disfigured discolored nails with subungual debris  from hallux to fifth toes bilaterally. No evidence of bacterial infection or drainage bilaterally.  Orthopedic  No limitations of motion  feet .  No crepitus or effusions noted.  No bony pathology or digital deformities noted.  Skin  normotropic skin with no porokeratosis noted bilaterally.  No signs of infections or ulcers noted.     Onychomycosis  Pain in right toes  Pain in left toes  Consent was obtained for treatment procedures.   Mechanical debridement of nails 1-5  bilaterally performed with a nail nipper.  Filed with dremel without incident.    Return office visit   10 weeks for Dr.  Jacqualyn Posey.                  Told patient to return for periodic foot care and evaluation due to potential at risk complications.   Gardiner Barefoot DPM

## 2019-07-22 DIAGNOSIS — E663 Overweight: Secondary | ICD-10-CM | POA: Insufficient documentation

## 2019-07-22 DIAGNOSIS — Z7189 Other specified counseling: Secondary | ICD-10-CM | POA: Insufficient documentation

## 2019-07-22 NOTE — Progress Notes (Signed)
Cardiology Office Note   Date:  07/23/2019   ID:  Michael Ensign Siordia Brooke Bonito., DOB 03-Aug-1945, MRN [id number]  PCP:  Mosie Lukes, MD  Cardiologist:   No primary care provider on file.   Chief Complaint  Patient presents with  . Coronary Artery Disease      History of Present Illness: Michael Cunningham. is a 74 y.o. male who presents for follow-up of his known coronary disease.  I last saw him in 2019.  He gets seen at the [place]. Since I last saw him he has done well.  He works out 3 to 5 days a week on a machine at home getting his heart rate up.  He denies any chest pressure, neck or arm discomfort.  He said none of the symptoms it was his previous angina.  He has had no palpitations, presyncope or syncope.  He has had a little week.  He has had no new swelling.   He has had none of the symptoms that he had back when he had his stents.  He is not as physically active as I would like.  However, with his current level of activity he denies any cardiovascular symptoms such as chest pressure, neck or arm discomfort.  He has had no palpitations, presyncope or syncope.  He has had no weight gain.  He has some chronic lower extremity edema that might be slightly more than before and is better in the morning and it is in the evening.  He has not had to take any sublingual nitroglycerin.  Past Medical History:  Diagnosis Date  . CAD (coronary artery disease)    a. 05/2014 [place] s/p DES x4 to mid-distLAD and DESx1 to DI  . Complication of anesthesia    11'12 Surgery with excrutiating "head about to explode" upon awakening  -lasted several hours postop  ( SURGERY DONE 08-13-2011 AT [facility] PT DID OK POST OP)  . Diverticulosis of colon    LEFT SIDE  . Exposure to Agent [facility] 10/18/2016   [place]  . History of small bowel obstruction    X2  28 YRS AGO AND LAST ONE 04/2014--  RESOLVED WITHOUT SURGICAL INTERVENTION  . Hyperlipidemia   . Hypertension   . Knee pain, bilateral 08/13/2011  . Left ureteral  calculus   . Multiple lipomas    hx. mulitple and some remains -arms, legs  . Obesity, unspecified 09/18/2013  . Obstructive sleep apnea 01/01/2016  . Recurrent kidney stones 07/08/2013   Follows with Dr Gaynelle Arabian He believes they are Calcium oxalate stones  . Thrombocytopenia ([place]) 07/07/2014  . Type 2 diabetes mellitus ([place])   . Vitamin D deficiency 07/02/2014    Past Surgical History:  Procedure Laterality Date  . COLONOSCOPY W/ POLYPECTOMY  LAST ONE 2013  . CORONARY STENT PLACEMENT  05/17/2014   PROXIMAL X 4  MID & DISTAL LAD  DIAGONAL   . KNEE ARTHROSCOPY  08/13/2011   right ARTHROSCOPY KNEE;  Surgeon: Gearlean Alf, MD;  Location[facility];  Service: Orthopedics;  Laterality: Right;  medial  meniscal debridement, excision of plica, synovectomy  . KNEE ARTHROSCOPY Left 11/16/2013   Procedure: LEFT KNEE ARTHROSCOPY MEDIAL MENISCAL DEBRIDEMENT, CHONDROPLASTY;  Surgeon: Gearlean Alf, MD;  Location: [facility];  Service: Orthopedics;  Laterality: Left;  . KNEE ARTHROSCOPY W/ MENISCECTOMY Right 01-29-2011   twice, first trimmed mediscus and cyst  . LEFT HEART CATHETERIZATION WITH CORONARY ANGIOGRAM N/A 05/17/2014   Procedure: LEFT HEART  CATHETERIZATION WITH CORONARY ANGIOGRAM;  Surgeon: Burnell Blanks, MD;  Location[facility] CATH LAB;  Service: Cardiovascular;  Laterality: N/A;  . [place]   multiple, 10-12  . PERCUTANEOUS CORONARY STENT INTERVENTION (PCI-S)  05/17/2014   Procedure: PERCUTANEOUS CORONARY STENT INTERVENTION (PCI-S);  Surgeon: Burnell Blanks, MD;  Location: [facility] CATH LAB;  Service: Cardiovascular;;  Diag1 and Prox to Distal LAD     Current Outpatient Medications  Medication Sig Dispense Refill  . ACCU-CHEK FASTCLIX LANCETS MISC Use to test blood sugar 2 times daily. Dx: E11.40 204 each 3  . acetaminophen (TYLENOL) 500 MG tablet Take 1,000 mg by mouth every 6 (six) hours as needed for mild pain. Reported on 03/31/2015    . aspirin EC 81 MG tablet Take 81 mg  by mouth daily with breakfast.     . Cholecalciferol (VITAMIN D PO) Take 5,000 Units by mouth daily.    . clotrimazole-betamethasone (LOTRISONE) cream Apply 1 application topically 2 (two) times daily. 30 g 0  . glucagon (GLUCAGON EMERGENCY) 1 MG injection Inject 1 mg into the muscle once as needed for up to 1 dose. 1 each 12  . glucose blood (ACCU-CHEK AVIVA PLUS) test strip Use to test blood sugar 2 times daily. Dx: E11.40 200 each 3  . insulin NPH Human (HUMULIN N) 100 UNIT/ML injection INJECT UNDER SKIN 20 UNITS IN THE AM AND 44 UNITS AT BEDTIME. 30 mL 3  . insulin regular (HUMULIN R) 100 units/mL injection Inject 0.2-0.35 mLs (20-35 Units total) into the skin 3 (three) times daily before meals. 30 mL 3  . Insulin Syringe-Needle U-100 (TRUEPLUS INSULIN SYRINGE) 30G X 5/16" 0.5 ML MISC USE TO INJECT INSULIN 5 TIMES DAILY. 200 each 3  . losartan (COZAAR) 50 MG tablet TAKE 1&1/2 (75 MG TOTAL) TABLETS BY MOUTH EVERY MORNING (Patient taking differently: Take 25 mg by mouth daily. TAKE 3- 25 MG TABLETS BY MOUTH EVERY MORNING) 180 tablet 3  . metoprolol tartrate (LOPRESSOR) 25 MG tablet Take 1 tablet (25 mg total) by mouth 2 (two) times daily. 180 tablet 3  . Multiple Vitamin (MULTIVITAMIN WITH MINERALS) TABS tablet Take 1 tablet by mouth daily.    . nitroGLYCERIN (NITROSTAT) 0.4 MG SL tablet Place 1 tablet (0.4 mg total) under the tongue every 5 (five) minutes as needed for chest pain. 25 tablet 3  . Polyethylene Glycol 3350 (MIRALAX PO) Take by mouth as needed. Reported on 03/31/2015    . prasugrel (EFFIENT) 10 MG TABS tablet Take 1 tablet (10 mg total) by mouth daily. 90 tablet 3  . Probiotic Product (PROBIOTIC-10 PO) Take 1 tablet by mouth daily.    . simvastatin (ZOCOR) 40 MG tablet TAKE 1 TABLET (40 MG) BY MOUTH AT BEDTIME. 90 tablet 2   No current facility-administered medications for this visit.    Allergies:   Ciprofloxacin, Lipitor [atorvastatin calcium], Lisinopril, and Metformin and  related    ROS:  Please see the history of present illness.   Otherwise, review of systems are positive for none.   All other systems are reviewed and negative.    PHYSICAL EXAM: VS:  BP 140/69   Pulse (!) 53   Temp (!) 97.3 F (36.3 C)   Resp 14   Ht 5\' 9"  (1.753 m)   Wt 267 lb (121.1 kg)   SpO2 97%   BMI 39.43 kg/m  , BMI Body mass index is 39.43 kg/m. GENERAL:  Well appearing NECK:  No jugular venous distention,  waveform within normal limits, carotid upstroke brisk and symmetric, no bruits, no thyromegaly LUNGS:  Clear to auscultation bilaterally CHEST:  Unremarkable HEART:  PMI not displaced or sustained,S1 and S2 within normal limits, no S3, no S4, no clicks, no rubs, no murmurs ABD:  Flat, positive bowel sounds normal in frequency in pitch, no bruits, no rebound, no guarding, no midline pulsatile mass, no hepatomegaly, no splenomegaly EXT:  2 plus pulses throughout, no edema, no cyanosis no clubbing   EKG:  EKG is  ordered today. The ekg ordered today demonstrates sinus rhythm, right bundle branch block, rate 49, axis within normal limits, intervals within normal limits, no acute ST-T wave changes.  No change from previous   Recent Labs: 12/28/2018: ALT 23; BUN 16; Creatinine, Ser 0.91; Hemoglobin 14.9; Platelets 152.0; Potassium 4.3; Sodium 137; TSH 2.55    Lipid Panel    Component Value Date/Time   CHOL 133 12/28/2018 0820   TRIG 164.0 (H) 12/28/2018 0820   HDL 33.90 (L) 12/28/2018 0820   CHOLHDL 4 12/28/2018 0820   VLDL 32.8 12/28/2018 0820   LDLCALC 67 12/28/2018 0820   LDLDIRECT 108.0 04/16/2014 1022      Wt Readings from Last 3 Encounters:  07/23/19 267 lb (121.1 kg)  02/15/19 257 lb (116.6 kg)  09/04/18 253 lb (114.8 kg)      Other studies Reviewed: Additional studies/ records that were reviewed today include: None. Review of the above records demonstrates:  NA  ASSESSMENT AND PLAN:   CAD/PCI/DES:   The patient's had no new symptoms.  No  further cardiovascular testing is suggested.  Per the suggestion from his catheterization in my review of the cath today he will remain on DAPT unless he has contraindications in the future.  HTN:  The blood pressure is upper limits.  Not going to make any changes but asked him to keep a blood pressure diary and lose weight.   OVERWEIGHT:   We talked again about a lower carbohydrate diet.   SLEEP APNEA:   Needs having this actively treated.   DYSLIPIDEMIA:  His LDL at the [place] was 59 with an LDL of 40.  No change in therapy.   DM: A1c slightly elevated at 7.4.  He is actively working with Dr. Cruzita Lederer to bring this down.  COVID EDUCATION: He has had his vaccinations.  Current medicines are reviewed at length with the patient today.  The patient does not have concerns regarding medicines.  The following changes have been made:  no change  Labs/ tests ordered today include: None  Orders Placed This Encounter  Procedures  . EKG 12-Lead     Disposition:   FU with me in 18 months or so.      Signed, Minus Breeding, MD  07/23/2019 12:01 PM    [place]

## 2019-07-23 ENCOUNTER — Encounter: Payer: Self-pay | Admitting: Cardiology

## 2019-07-23 ENCOUNTER — Ambulatory Visit: Payer: Medicare HMO | Admitting: Cardiology

## 2019-07-23 ENCOUNTER — Other Ambulatory Visit: Payer: Self-pay

## 2019-07-23 VITALS — BP 140/69 | HR 53 | Temp 97.3°F | Resp 14 | Ht 69.0 in | Wt 267.0 lb

## 2019-07-23 DIAGNOSIS — E785 Hyperlipidemia, unspecified: Secondary | ICD-10-CM | POA: Diagnosis not present

## 2019-07-23 DIAGNOSIS — M7989 Other specified soft tissue disorders: Secondary | ICD-10-CM

## 2019-07-23 DIAGNOSIS — Z7189 Other specified counseling: Secondary | ICD-10-CM

## 2019-07-23 DIAGNOSIS — I2510 Atherosclerotic heart disease of native coronary artery without angina pectoris: Secondary | ICD-10-CM | POA: Diagnosis not present

## 2019-07-23 DIAGNOSIS — G4730 Sleep apnea, unspecified: Secondary | ICD-10-CM | POA: Diagnosis not present

## 2019-07-23 DIAGNOSIS — E663 Overweight: Secondary | ICD-10-CM

## 2019-07-23 DIAGNOSIS — I10 Essential (primary) hypertension: Secondary | ICD-10-CM

## 2019-07-23 NOTE — Patient Instructions (Signed)
Medication Instructions:  No changes *If you need a refill on your cardiac medications before your next appointment, please call your pharmacy*  Lab Work: None ordered this visit  Testing/Procedures: None ordered this visit  Follow-Up: At [facility], you and your health needs are our priority.  As part of our continuing mission to provide you with exceptional heart care, we have created designated Provider Care Teams.  These Care Teams include your primary Cardiologist (physician) and Advanced Practice Providers (APPs -  Physician Assistants and Nurse Practitioners) who all work together to provide you with the care you need, when you need it.   Your next appointment:   18 month(s)  You will receive a reminder letter in the mail two months in advance. If you don't receive a letter, please call our office to schedule the follow-up appointment.  The format for your next appointment:   In Person  Provider:   Minus Breeding, MD

## 2019-08-09 ENCOUNTER — Other Ambulatory Visit (INDEPENDENT_AMBULATORY_CARE_PROVIDER_SITE_OTHER): Payer: Medicare HMO

## 2019-08-09 ENCOUNTER — Other Ambulatory Visit: Payer: Self-pay

## 2019-08-09 DIAGNOSIS — E782 Mixed hyperlipidemia: Secondary | ICD-10-CM

## 2019-08-09 DIAGNOSIS — I10 Essential (primary) hypertension: Secondary | ICD-10-CM

## 2019-08-09 DIAGNOSIS — E1142 Type 2 diabetes mellitus with diabetic polyneuropathy: Secondary | ICD-10-CM | POA: Diagnosis not present

## 2019-08-09 DIAGNOSIS — E559 Vitamin D deficiency, unspecified: Secondary | ICD-10-CM

## 2019-08-09 LAB — CBC
HCT: 42.5 % (ref 39.0–52.0)
Hemoglobin: 14.7 g/dL (ref 13.0–17.0)
MCHC: 34.6 g/dL (ref 30.0–36.0)
MCV: 93.4 fl (ref 78.0–100.0)
Platelets: 147 10*3/uL — ABNORMAL LOW (ref 150.0–400.0)
RBC: 4.55 Mil/uL (ref 4.22–5.81)
RDW: 14 % (ref 11.5–15.5)
WBC: 5.8 10*3/uL (ref 4.0–10.5)

## 2019-08-09 LAB — LIPID PANEL
Cholesterol: 135 mg/dL (ref 0–200)
HDL: 33.4 mg/dL — ABNORMAL LOW (ref >39.00)
LDL Cholesterol: 75 mg/dL (ref 0–99)
NonHDL: 101.99
Total CHOL/HDL Ratio: 4
Triglycerides: 137 mg/dL (ref 0.0–149.0)
VLDL: 27.4 mg/dL (ref 0.0–40.0)

## 2019-08-09 LAB — COMPREHENSIVE METABOLIC PANEL
ALT: 20 U/L (ref 0–53)
AST: 19 U/L (ref 0–37)
Albumin: 4.1 g/dL (ref 3.5–5.2)
Alkaline Phosphatase: 81 U/L (ref 39–117)
BUN: 16 mg/dL (ref 6–23)
CO2: 26 mEq/L (ref 19–32)
Calcium: 8.8 mg/dL (ref 8.4–10.5)
Chloride: 104 mEq/L (ref 96–112)
Creatinine, Ser: 0.79 mg/dL (ref 0.40–1.50)
GFR: 95.59 mL/min (ref >60.00)
Glucose, Bld: 189 mg/dL — ABNORMAL HIGH (ref 70–99)
Potassium: 4.4 mEq/L (ref 3.5–5.1)
Sodium: 136 mEq/L (ref 135–145)
Total Bilirubin: 0.7 mg/dL (ref 0.2–1.2)
Total Protein: 6.7 g/dL (ref 6.0–8.3)

## 2019-08-09 LAB — VITAMIN D 25 HYDROXY (VIT D DEFICIENCY, FRACTURES): VITD: 38.06 ng/mL (ref 30.00–100.00)

## 2019-08-09 LAB — HEMOGLOBIN A1C: Hgb A1c MFr Bld: 7.6 % — ABNORMAL HIGH (ref 4.6–6.5)

## 2019-08-09 LAB — TSH: TSH: 2.67 u[IU]/mL (ref 0.35–4.50)

## 2019-08-16 ENCOUNTER — Other Ambulatory Visit: Payer: Self-pay

## 2019-08-16 ENCOUNTER — Telehealth (INDEPENDENT_AMBULATORY_CARE_PROVIDER_SITE_OTHER): Payer: Medicare HMO | Admitting: Family Medicine

## 2019-08-16 VITALS — BP 148/71 | HR 62 | Ht 69.0 in | Wt 257.4 lb

## 2019-08-16 DIAGNOSIS — E782 Mixed hyperlipidemia: Secondary | ICD-10-CM

## 2019-08-16 DIAGNOSIS — E1142 Type 2 diabetes mellitus with diabetic polyneuropathy: Secondary | ICD-10-CM | POA: Diagnosis not present

## 2019-08-16 DIAGNOSIS — I10 Essential (primary) hypertension: Secondary | ICD-10-CM | POA: Diagnosis not present

## 2019-08-16 DIAGNOSIS — E559 Vitamin D deficiency, unspecified: Secondary | ICD-10-CM

## 2019-08-16 DIAGNOSIS — I2510 Atherosclerotic heart disease of native coronary artery without angina pectoris: Secondary | ICD-10-CM

## 2019-08-19 NOTE — Progress Notes (Signed)
Virtual Visit via Video Note  I connected with Michael Gavel. on 08/16/19 at 11:00 AM EDT by a video enabled telemedicine application and verified that I am speaking with the correct person using two identifiers.  Location: Patient: home Provider: office, patient and provider are in visit   I discussed the limitations of evaluation and management by telemedicine and the availability of in person appointments. The patient expressed understanding and agreed to proceed. Kem Boroughs, CMA was able to get patient on a video visit.     Subjective:    Patient ID: Michael Gavel., male    DOB: 06-14-1945, 74 y.o.   MRN: [id number]  Chief Complaint  Patient presents with  . Hypertension    follow up    HPI Patient is in today for follow up on chronic medical concerns. He feels well today. No recent febrile illness or hospitalizations. He has been staying home mostly. His sugars have been high mostly in the morning. His sugars have ranged from 69 to 200 recently. No c/o polyuria or polydipsia. Denies CP/palp/SOB/HA/congestion/fevers/GI or GU c/o. Taking meds as prescribed  Past Medical History:  Diagnosis Date  . CAD (coronary artery disease)    a. 05/2014 [place] s/p DES x4 to mid-distLAD and DESx1 to DI  . Complication of anesthesia    11'12 Surgery with excrutiating "head about to explode" upon awakening  -lasted several hours postop  ( SURGERY DONE 08-13-2011 AT [facility] PT DID OK POST OP)  . Diverticulosis of colon    LEFT SIDE  . Exposure to Agent [facility] 10/18/2016   [place]  . History of small bowel obstruction    X2  28 YRS AGO AND LAST ONE 04/2014--  RESOLVED WITHOUT SURGICAL INTERVENTION  . Hyperlipidemia   . Hypertension   . Knee pain, bilateral 08/13/2011  . Left ureteral calculus   . Multiple lipomas    hx. mulitple and some remains -arms, legs  . Obesity, unspecified 09/18/2013  . Obstructive sleep apnea 01/01/2016  . Recurrent kidney stones 07/08/2013   Follows with Dr  Gaynelle Arabian He believes they are Calcium oxalate stones  . Thrombocytopenia ([place]) 07/07/2014  . Type 2 diabetes mellitus ([place])   . Vitamin D deficiency 07/02/2014    Past Surgical History:  Procedure Laterality Date  . COLONOSCOPY W/ POLYPECTOMY  LAST ONE 2013  . CORONARY STENT PLACEMENT  05/17/2014   PROXIMAL X 4  MID & DISTAL LAD  DIAGONAL   . KNEE ARTHROSCOPY  08/13/2011   right ARTHROSCOPY KNEE;  Surgeon: Gearlean Alf, MD;  Location: [facility]S;  Service: Orthopedics;  Laterality: Right;  medial  meniscal debridement, excision of plica, synovectomy  . KNEE ARTHROSCOPY Left 11/16/2013   Procedure: LEFT KNEE ARTHROSCOPY MEDIAL MENISCAL DEBRIDEMENT, CHONDROPLASTY;  Surgeon: Gearlean Alf, MD;  Location: [facility];  Service: Orthopedics;  Laterality: Left;  . KNEE ARTHROSCOPY W/ MENISCECTOMY Right 01-29-2011   twice, first trimmed mediscus and cyst  . LEFT HEART CATHETERIZATION WITH CORONARY ANGIOGRAM N/A 05/17/2014   Procedure: LEFT HEART CATHETERIZATION WITH CORONARY ANGIOGRAM;  Surgeon: Burnell Blanks, MD;  Location: [facility] CATH LAB;  Service: Cardiovascular;  Laterality: N/A;  . Candelaria Arenas   multiple, 10-12  . PERCUTANEOUS CORONARY STENT INTERVENTION (PCI-S)  05/17/2014   Procedure: PERCUTANEOUS CORONARY STENT INTERVENTION (PCI-S);  Surgeon: Burnell Blanks, MD;  Location: [facility] CATH LAB;  Service: Cardiovascular;;  Diag1 and Prox to Distal LAD    Family History  Problem Relation Age  of Onset  . Hypertension Mother   . Cancer Mother 42       ovarian  . Heart disease Father        CHF  . Heart attack Father 13  . Cancer Sister        breast- just finished her last radiation  . Heart disease Maternal Grandfather        ?  [name] Heart attack Paternal Grandfather 50  . Heart attack Paternal Uncle 26       Sudden death  . Diabetes Neg Hx     Social History   Socioeconomic History  . Marital status: Married    Spouse name: Not on file  . Number of children: 2  .  Years of education: Not on file  . Highest education level: Not on file  Occupational History  . Not on file  Tobacco Use  . Smoking status: Former Smoker    Packs/day: 1.00    Years: 4.00    Pack years: 4.00    Types: Cigarettes    Quit date: 06/14/1967    Years since quitting: 52.2  . Smokeless tobacco: Never Used  Substance and Sexual Activity  . Alcohol use: No    Alcohol/week: 0.0 standard drinks  . Drug use: No  . Sexual activity: Yes    Comment: lives with wife  Other Topics Concern  . 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     Social Determinants of Health   Financial Resource Strain:   . Difficulty of Paying Living Expenses:   Food Insecurity:   . Worried About Charity fundraiser in the Last Year:   . Arboriculturist in the Last Year:   Transportation Needs:   . Film/video editor (Medical):   [name] Lack of Transportation (Non-Medical):   Physical Activity:   . Days of Exercise per Week:   . Minutes of Exercise per Session:   Stress:   . Feeling of Stress :   Social Connections:   . Frequency of Communication with Friends and Family:   . Frequency of Social Gatherings with Friends and Family:   . Attends Religious Services:   . Active Member of Clubs or Organizations:   . Attends Archivist Meetings:   [name] Marital Status:   Intimate Partner Violence:   . Fear of Current or Ex-Partner:   . Emotionally Abused:   [name] Physically Abused:   . Sexually Abused:     Outpatient Medications Prior to Visit  Medication Sig Dispense Refill  . ACCU-CHEK FASTCLIX LANCETS MISC Use to test blood sugar 2 times daily. Dx: E11.40 204 each 3  . acetaminophen (TYLENOL) 500 MG tablet Take 1,000 mg by mouth every 6 (six) hours as needed for mild pain. Reported on 03/31/2015    . aspirin EC 81 MG tablet Take 81 mg by mouth daily with breakfast.     . Cholecalciferol (VITAMIN D PO) Take 5,000 Units by mouth daily.    .  clotrimazole-betamethasone (LOTRISONE) cream Apply 1 application topically 2 (two) times daily. 30 g 0  . glucagon (GLUCAGON EMERGENCY) 1 MG injection Inject 1 mg into the muscle once as needed for up to 1 dose. 1 each 12  . glucose blood (ACCU-CHEK AVIVA PLUS) test strip Use to test blood sugar 2 times daily. Dx: E11.40 200 each 3  . insulin NPH Human (HUMULIN N) 100 UNIT/ML injection INJECT UNDER  SKIN 20 UNITS IN THE AM AND 44 UNITS AT BEDTIME. 30 mL 3  . insulin regular (HUMULIN R) 100 units/mL injection Inject 0.2-0.35 mLs (20-35 Units total) into the skin 3 (three) times daily before meals. 30 mL 3  . Insulin Syringe-Needle U-100 (TRUEPLUS INSULIN SYRINGE) 30G X 5/16" 0.5 ML MISC USE TO INJECT INSULIN 5 TIMES DAILY. 200 each 3  . losartan (COZAAR) 50 MG tablet TAKE 1&1/2 (75 MG TOTAL) TABLETS BY MOUTH EVERY MORNING (Patient taking differently: Take 25 mg by mouth daily. TAKE 3- 25 MG TABLETS BY MOUTH EVERY MORNING) 180 tablet 3  . metoprolol tartrate (LOPRESSOR) 25 MG tablet Take 1 tablet (25 mg total) by mouth 2 (two) times daily. 180 tablet 3  . Multiple Vitamin (MULTIVITAMIN WITH MINERALS) TABS tablet Take 1 tablet by mouth daily.    . nitroGLYCERIN (NITROSTAT) 0.4 MG SL tablet Place 1 tablet (0.4 mg total) under the tongue every 5 (five) minutes as needed for chest pain. 25 tablet 3  . Polyethylene Glycol 3350 (MIRALAX PO) Take by mouth as needed. Reported on 03/31/2015    . prasugrel (EFFIENT) 10 MG TABS tablet Take 1 tablet (10 mg total) by mouth daily. 90 tablet 3  . Probiotic Product (PROBIOTIC-10 PO) Take 1 tablet by mouth daily.    . simvastatin (ZOCOR) 40 MG tablet TAKE 1 TABLET (40 MG) BY MOUTH AT BEDTIME. 90 tablet 2   No facility-administered medications prior to visit.    Allergies  Allergen Reactions  . Ciprofloxacin Other (See Comments)    Abdominal pain  . Lipitor [Atorvastatin Calcium] Other (See Comments)    Severe muscle pain  . Lisinopril Cough  . Metformin And  Related Diarrhea    Review of Systems  Constitutional: Negative for fever and malaise/fatigue.  HENT: Negative for congestion.   Eyes: Negative for blurred vision.  Respiratory: Negative for shortness of breath.   Cardiovascular: Negative for chest pain, palpitations and leg swelling.  Gastrointestinal: Negative for abdominal pain, blood in stool and nausea.  Genitourinary: Negative for dysuria and frequency.  Musculoskeletal: Negative for falls.  Skin: Negative for rash.  Neurological: Negative for dizziness, loss of consciousness and headaches.  Endo/Heme/Allergies: Negative for environmental allergies.  Psychiatric/Behavioral: Negative for depression. The patient is not nervous/anxious.        Objective:    Physical Exam Constitutional:      Appearance: Normal appearance. He is not ill-appearing.  HENT:     Head: Normocephalic and atraumatic.     Right Ear: External ear normal.     Left Ear: External ear normal.     Nose: Nose normal.  Eyes:     General:        Right eye: No discharge.        Left eye: No discharge.  Pulmonary:     Effort: Pulmonary effort is normal.  Neurological:     Mental Status: He is alert and oriented to person, place, and time.  Psychiatric:        Behavior: Behavior normal.     BP (!) 148/71   Pulse 62   Ht 5\' 9"  (1.753 m)   Wt 257 lb 6.4 oz (116.8 kg)   SpO2 95%   BMI 38.01 kg/m  Wt Readings from Last 3 Encounters:  08/16/19 257 lb 6.4 oz (116.8 kg)  07/23/19 267 lb (121.1 kg)  02/15/19 257 lb (116.6 kg)    Diabetic Foot Exam - Simple   No data filed  Lab Results  Component Value Date   WBC 5.8 08/09/2019   HGB 14.7 08/09/2019   HCT 42.5 08/09/2019   PLT 147.0 (L) 08/09/2019   GLUCOSE 189 (H) 08/09/2019   CHOL 135 08/09/2019   TRIG 137.0 08/09/2019   HDL 33.40 (L) 08/09/2019   LDLDIRECT 108.0 04/16/2014   LDLCALC 75 08/09/2019   ALT 20 08/09/2019   AST 19 08/09/2019   NA 136 08/09/2019   K 4.4 08/09/2019   CL  104 08/09/2019   CREATININE 0.79 08/09/2019   BUN 16 08/09/2019   CO2 26 08/09/2019   TSH 2.67 08/09/2019   PSA 1.92 09/25/2018   INR 1.02 05/17/2014   HGBA1C 7.6 (H) 08/09/2019   MICROALBUR 1.7 06/24/2015    Lab Results  Component Value Date   TSH 2.67 08/09/2019   Lab Results  Component Value Date   WBC 5.8 08/09/2019   HGB 14.7 08/09/2019   HCT 42.5 08/09/2019   MCV 93.4 08/09/2019   PLT 147.0 (L) 08/09/2019   Lab Results  Component Value Date   NA 136 08/09/2019   K 4.4 08/09/2019   CO2 26 08/09/2019   GLUCOSE 189 (H) 08/09/2019   BUN 16 08/09/2019   CREATININE 0.79 08/09/2019   BILITOT 0.7 08/09/2019   ALKPHOS 81 08/09/2019   AST 19 08/09/2019   ALT 20 08/09/2019   PROT 6.7 08/09/2019   ALBUMIN 4.1 08/09/2019   CALCIUM 8.8 08/09/2019   ANIONGAP 8 05/18/2014   GFR 95.59 08/09/2019   Lab Results  Component Value Date   CHOL 135 08/09/2019   Lab Results  Component Value Date   HDL 33.40 (L) 08/09/2019   Lab Results  Component Value Date   LDLCALC 75 08/09/2019   Lab Results  Component Value Date   TRIG 137.0 08/09/2019   Lab Results  Component Value Date   CHOLHDL 4 08/09/2019   Lab Results  Component Value Date   HGBA1C 7.6 (H) 08/09/2019       Assessment & Plan:   Problem List Items Addressed This Visit    Hypertension (Chronic)    Monitor and report any concerns. no changes to meds. Encouraged heart healthy diet such as the DASH diet and exercise as tolerated.       Relevant Orders   CBC   Comprehensive metabolic panel   TSH   Hyperlipidemia    Tolerating statin, encouraged heart healthy diet, avoid trans fats, minimize simple carbs and saturated fats. Increase exercise as tolerated      Relevant Orders   Lipid panel   CAD (coronary artery disease)    Not symptomatic at this time      Vitamin D deficiency - Primary    Supplement and monitor      Relevant Orders   VITAMIN D 25 Hydroxy (Vit-D Deficiency, Fractures)    Well controlled type 2 diabetes mellitus with peripheral neuropathy ([place])    He notes his sugars have ranged from 69 to 200 recently. minimize simple carbs. Increase exercise as tolerated. Continue current meds but increase his evening dose of Novolin N from 45 to 47 as his morning sugars tend to be the highest. Continue N at 20 and R at 30 units tid until seen by endocrinology      Relevant Orders   Hemoglobin A1c      I am having Michael M. Lovick Brooke Bonito. maintain his aspirin EC, multivitamin with minerals, acetaminophen, Polyethylene Glycol 3350 (MIRALAX PO), losartan, metoprolol tartrate, Accu-Chek FastClix Lancets,  glucose blood, simvastatin, Insulin Syringe-Needle U-100, Probiotic Product (PROBIOTIC-10 PO), Cholecalciferol (VITAMIN D PO), glucagon, insulin NPH Human, insulin regular, prasugrel, nitroGLYCERIN, and clotrimazole-betamethasone.  No orders of the defined types were placed in this encounter.    I discussed the assessment and treatment plan with the patient. The patient was provided an opportunity to ask questions and all were answered. The patient agreed with the plan and demonstrated an understanding of the instructions.   The patient was advised to call back or seek an in-person evaluation if the symptoms worsen or if the condition fails to improve as anticipated.  I provided 20 minutes of non-face-to-face time during this encounter.   Penni Homans, MD

## 2019-08-19 NOTE — Assessment & Plan Note (Signed)
Not symptomatic at this time

## 2019-08-19 NOTE — Assessment & Plan Note (Signed)
Tolerating statin, encouraged heart healthy diet, avoid trans fats, minimize simple carbs and saturated fats. Increase exercise as tolerated 

## 2019-08-19 NOTE — Assessment & Plan Note (Addendum)
He notes his sugars have ranged from 69 to 200 recently. minimize simple carbs. Increase exercise as tolerated. Continue current meds but increase his evening dose of Novolin N from 45 to 47 as his morning sugars tend to be the highest. Continue N at 20 and R at 30 units tid until seen by endocrinology

## 2019-08-19 NOTE — Assessment & Plan Note (Signed)
Monitor and report any concerns. no changes to meds. Encouraged heart healthy diet such as the DASH diet and exercise as tolerated.  

## 2019-08-19 NOTE — Assessment & Plan Note (Signed)
Supplement and monitor 

## 2019-08-28 ENCOUNTER — Ambulatory Visit: Payer: Medicare HMO | Admitting: Pulmonary Disease

## 2019-08-28 ENCOUNTER — Other Ambulatory Visit: Payer: Self-pay

## 2019-08-28 ENCOUNTER — Encounter: Payer: Self-pay | Admitting: Pulmonary Disease

## 2019-08-28 VITALS — BP 128/82 | HR 51 | Temp 98.3°F | Ht 69.0 in | Wt 264.0 lb

## 2019-08-28 DIAGNOSIS — G4733 Obstructive sleep apnea (adult) (pediatric): Secondary | ICD-10-CM | POA: Diagnosis not present

## 2019-08-28 DIAGNOSIS — Z9989 Dependence on other enabling machines and devices: Secondary | ICD-10-CM | POA: Diagnosis not present

## 2019-08-28 NOTE — Patient Instructions (Signed)
Adequately treated severe obstructive sleep apnea  Continue using CPAP on a regular basis  Call office with significant concerns  I will see you in 6 months

## 2019-08-28 NOTE — Progress Notes (Signed)
Subjective:    Patient ID: Michael Cunningham., male    DOB: 1945-12-06, 74 y.o.   MRN: [id number]  Patient with obstructive sleep apnea diagnosed in 2017  Has been using CPAP since 2017 Good improvement in symptoms since he started CPAP use  Currently uses a CPAP of 12 was initially on CPAP of 14 Has no issues with his CPAP currently  Usually goes to bed about 10 PM 20 to 30 minutes sleep onset About 2 awakenings Final awakening time about 6-8 a.m.  Comorbidities include hypertension, coronary artery disease, diabetes, hypercholesterolemia  Sleeps well Functions well  Very active, 30 minutes of exercise regularly  Past Medical History:  Diagnosis Date  . CAD (coronary artery disease)    a. 05/2014 [place] s/p DES x4 to mid-distLAD and DESx1 to DI  . Complication of anesthesia    11'12 Surgery with excrutiating "head about to explode" upon awakening  -lasted several hours postop  ( SURGERY DONE 08-13-2011 AT [facility] PT DID OK POST OP)  . Diverticulosis of colon    LEFT SIDE  . Exposure to Agent [facility] 10/18/2016   [place]  . History of small bowel obstruction    X2  28 YRS AGO AND LAST ONE 04/2014--  RESOLVED WITHOUT SURGICAL INTERVENTION  . Hyperlipidemia   . Hypertension   . Knee pain, bilateral 08/13/2011  . Left ureteral calculus   . Multiple lipomas    hx. mulitple and some remains -arms, legs  . Obesity, unspecified 09/18/2013  . Obstructive sleep apnea 01/01/2016  . Recurrent kidney stones 07/08/2013   Follows with Dr Gaynelle Arabian He believes they are Calcium oxalate stones  . Thrombocytopenia ([place]) 07/07/2014  . Type 2 diabetes mellitus ([place])   . Vitamin D deficiency 07/02/2014   Social History   Socioeconomic History  . Marital status: Married    Spouse name: Not on file  . Number of children: 2  . Years of education: Not on file  . Highest education level: Not on file  Occupational History  . Not on file  Tobacco Use  . Smoking status: Former Smoker    Packs/day:  1.00    Years: 4.00    Pack years: 4.00    Types: Cigarettes    Quit date: 06/14/1967    Years since quitting: 52.2  . Smokeless tobacco: Never Used  Substance and Sexual Activity  . Alcohol use: No    Alcohol/week: 0.0 standard drinks  . Drug use: No  . Sexual activity: Yes    Comment: lives with wife  Other Topics Concern  . 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     Social Determinants of Health   Financial Resource Strain:   . Difficulty of Paying Living Expenses:   Food Insecurity:   . Worried About Charity fundraiser in the Last Year:   . Arboriculturist in the Last Year:   Transportation Needs:   . Film/video editor (Medical):   [name] Lack of Transportation (Non-Medical):   Physical Activity:   . Days of Exercise per Week:   . Minutes of Exercise per Session:   Stress:   . Feeling of Stress :   Social Connections:   . Frequency of Communication with Friends and Family:   . Frequency of Social Gatherings with Friends and Family:   . Attends Religious Services:   . Active Member of Clubs or Organizations:   .  Attends Archivist Meetings:   [name] Marital Status:   Intimate Partner Violence:   . Fear of Current or Ex-Partner:   . Emotionally Abused:   [name] Physically Abused:   . Sexually Abused:    Family History  Problem Relation Age of Onset  . Hypertension Mother   . Cancer Mother 75       ovarian  . Heart disease Father        CHF  . Heart attack Father 84  . Cancer Sister        breast- just finished her last radiation  . Heart disease Maternal Grandfather        ?  [name] Heart attack Paternal Grandfather 71  . Heart attack Paternal Uncle 32       Sudden death  . Diabetes Neg Hx    Review of Systems  Constitutional: Negative for fever and unexpected weight change.  HENT: Negative for congestion, dental problem, ear pain, nosebleeds, postnasal drip, rhinorrhea, sinus pressure, sneezing, sore  throat and trouble swallowing.   Eyes: Negative for redness and itching.  Respiratory: Negative for cough, chest tightness, shortness of breath and wheezing.   Cardiovascular: Negative for palpitations and leg swelling.  Gastrointestinal: Negative for nausea and vomiting.  Genitourinary: Negative for dysuria.  Musculoskeletal: Negative for joint swelling.  Skin: Negative for rash.  Allergic/Immunologic: Negative.  Negative for environmental allergies, food allergies and immunocompromised state.  Neurological: Negative for headaches.  Hematological: Does not bruise/bleed easily.  Psychiatric/Behavioral: Negative for dysphoric mood. The patient is not nervous/anxious.       Objective:   Physical Exam Constitutional:      Appearance: He is obese.  HENT:     Head: Normocephalic.     Nose: No congestion.     Mouth/Throat:     Mouth: Mucous membranes are moist.     Comments: Mallampati 3, crowded oropharynx Eyes:     Extraocular Movements: Extraocular movements intact.     Pupils: Pupils are equal, round, and reactive to light.  Cardiovascular:     Rate and Rhythm: Normal rate and regular rhythm.     Pulses: Normal pulses.     Heart sounds: No murmur heard.   Pulmonary:     Effort: Pulmonary effort is normal. No respiratory distress.     Breath sounds: Normal breath sounds. No stridor. No wheezing or rhonchi.  Musculoskeletal:     Cervical back: No rigidity. No muscular tenderness.  Neurological:     Mental Status: He is alert.    Vitals:   08/28/19 1136  BP: 128/82  Pulse: (!) 51  Temp: 98.3 F (36.8 C)  SpO2: 96%   Results of the Epworth flowsheet 02/15/2019  Sitting and reading 1  Watching TV 1  Sitting, inactive in a public place (e.g. a theatre or a meeting) 0  As a passenger in a car for an hour without a break 0  Lying down to rest in the afternoon when circumstances permit 0  Sitting and talking to someone 0  Sitting quietly after a lunch without alcohol 0    In a car, while stopped for a few minutes in traffic 0  Total score 2   Download not available  Sleep study report reviewed in epic  Compliance shows 100% compliance CPAP of 12 Residual AHI of 2.5     Assessment & Plan:  .  Obstructive sleep apnea -Severe sleep apnea adequately treated -Adequately treated with CPAP therapy -Improvement in symptoms  .  Obesity -Encouraged about weight loss efforts -He already stays very active -Discussed exercise and diet  Coronary artery disease Status post stenting in the past -Stable  Plan: Continue CPAP therapy  Encouraged to continue weight loss efforts  I will see him back in the office in about 6 months  Encouraged to call with any significant concerns

## 2019-09-24 ENCOUNTER — Ambulatory Visit: Payer: Medicare HMO | Admitting: Podiatry

## 2019-09-27 ENCOUNTER — Ambulatory Visit: Payer: Medicare HMO | Admitting: *Deleted

## 2019-10-01 ENCOUNTER — Ambulatory Visit: Payer: Medicare HMO | Admitting: Podiatry

## 2019-10-01 ENCOUNTER — Encounter: Payer: Self-pay | Admitting: Podiatry

## 2019-10-01 ENCOUNTER — Other Ambulatory Visit: Payer: Self-pay

## 2019-10-01 DIAGNOSIS — D689 Coagulation defect, unspecified: Secondary | ICD-10-CM | POA: Diagnosis not present

## 2019-10-01 DIAGNOSIS — B351 Tinea unguium: Secondary | ICD-10-CM

## 2019-10-01 DIAGNOSIS — E1149 Type 2 diabetes mellitus with other diabetic neurological complication: Secondary | ICD-10-CM | POA: Diagnosis not present

## 2019-10-01 DIAGNOSIS — M79676 Pain in unspecified toe(s): Secondary | ICD-10-CM | POA: Diagnosis not present

## 2019-10-02 NOTE — Progress Notes (Signed)
Subjective: 74 y.o. returns the office today for painful, elongated, thickened toenails which he cannot trim himself. Denies any redness or drainage around the nails. He has no new concerns. Denies any systemic complaints such as fevers, chills, nausea, vomiting.   PCP: Mosie Lukes, MD  Endocrinologist: Dr. Benjiman Core, MD   Objective: AAO 3, NAD DP/PT pulses palpable, CRT less than 3 seconds Sensation is mildly decreased with SWM but overall no change.  Neuropathy symptoms are unchanged. Nails hypertrophic, dystrophic, elongated, brittle, discolored 10. There is tenderness overlying the nails 1-5 bilaterally. There is no surrounding erythema or drainage along the nail sites. Scaly, erythematous rash present the toes.  No open sores no drainage or skin fissures. No pain with calf compression, swelling, warmth, erythema.  Assessment: Patient presents with symptomatic onychomycosis; skin rash  Plan: -Treatment options including alternatives, risks, complications were discussed -Nails sharply debrided 10 without complication/bleeding. -Continue to monitor neuropathy -Discussed daily foot inspection. If there are any changes, to call the office immediately.  -Follow-up in 3 months or sooner if any problems are to arise. In the meantime, encouraged to call the office with any questions, concerns, changes symptoms.  Celesta Gentile, DPM

## 2019-10-03 NOTE — Progress Notes (Signed)
I connected with [place] today by telephone and verified that I am speaking with the correct person using two identifiers. Location patient: home Location provider: work Persons participating in the virtual visit: patient, Marine scientist.    I discussed the limitations, risks, security and privacy concerns of performing an evaluation and management service by telephone and the availability of in person appointments. I also discussed with the patient that there may be a patient responsible charge related to this service. The patient expressed understanding and verbally consented to this telephonic visit.    Interactive audio and video telecommunications were attempted between this provider and patient, however failed, due to patient having technical difficulties OR patient did not have access to video capability.  We continued and completed visit with audio only.  Some vital signs may be absent or patient reported.    Subjective:   Michael Cunningham. is a 74 y.o. male who presents for Medicare Annual/Subsequent preventive examination.  Review of Systems    Cardiac Risk Factors include: advanced age (>41men, >24 women);diabetes mellitus;hypertension;male gender;obesity (BMI >30kg/m2)     Objective:     Advanced Directives 10/04/2019 09/26/2018 09/20/2017 09/16/2016 08/24/2015 08/18/2015 08/05/2015  Does Patient Have a Medical Advance Directive? Yes Yes Yes Yes Yes Yes Yes  Type of Paramedic of [place];Living will [place];Living will [place];Living will [place];Living will [place];Living will [place];Living will [place];Living will  Does patient want to make changes to medical advance directive? No - Patient declined No - Patient declined No - Patient declined - No - Patient declined No - Patient declined No - Patient declined  Copy of [place] in Chart? Yes - validated most recent copy scanned in chart (See row information) Yes - validated most recent copy scanned in chart (See row information) Yes No - copy requested No - copy requested No - copy requested No - copy requested  Would patient like information on creating a medical advance directive? - - - - - - -  Pre-existing out of facility DNR order (yellow form or pink MOST form) - - - - - - -    Current Medications (verified) Outpatient Encounter Medications as of 10/04/2019  Medication Sig  . ACCU-CHEK FASTCLIX LANCETS MISC Use to test blood sugar 2 times daily. Dx: E11.40  . acetaminophen (TYLENOL) 500 MG tablet Take 1,000 mg by mouth every 6 (six) hours as needed for mild pain. Reported on 03/31/2015  . aspirin EC 81 MG tablet Take 81 mg by mouth daily with breakfast.   . Cholecalciferol (VITAMIN D PO) Take 5,000 Units by mouth daily.  [name] glucagon (GLUCAGON EMERGENCY) 1 MG injection Inject 1 mg into the muscle once as needed for up to 1 dose.  [name] glucose blood (ACCU-CHEK AVIVA PLUS) test strip Use to test blood sugar 2 times daily. Dx: E11.40  . insulin NPH Human (HUMULIN N) 100 UNIT/ML injection INJECT UNDER SKIN 20 UNITS IN THE AM AND 44 UNITS AT BEDTIME. (Patient taking differently: INJECT UNDER SKIN 20 UNITS IN THE AM AND 47 UNITS AT BEDTIME.)  . insulin regular (HUMULIN R) 100 units/mL injection Inject 0.2-0.35 mLs (20-35 Units total) into the skin 3 (three) times daily before meals.  . Insulin Syringe-Needle U-100 (TRUEPLUS INSULIN SYRINGE) 30G X 5/16" 0.5 ML MISC USE TO INJECT INSULIN 5 TIMES DAILY.  [name] losartan (COZAAR) 50 MG tablet TAKE 1&1/2 (75  MG TOTAL) TABLETS BY MOUTH EVERY MORNING (Patient taking differently: Take 25 mg by mouth daily. TAKE 3- 25 MG TABLETS BY MOUTH EVERY MORNING)  . metoprolol tartrate (LOPRESSOR) 25 MG tablet Take 1 tablet (25 mg total) by mouth 2 (two) times daily.  . Multiple Vitamin (MULTIVITAMIN WITH MINERALS) TABS tablet Take 1 tablet by  mouth daily.  . Polyethylene Glycol 3350 (MIRALAX PO) Take by mouth as needed. Reported on 03/31/2015  . prasugrel (EFFIENT) 10 MG TABS tablet Take 1 tablet (10 mg total) by mouth daily.  . simvastatin (ZOCOR) 40 MG tablet TAKE 1 TABLET (40 MG) BY MOUTH AT BEDTIME.  . nitroGLYCERIN (NITROSTAT) 0.4 MG SL tablet Place 1 tablet (0.4 mg total) under the tongue every 5 (five) minutes as needed for chest pain. (Patient not taking: Reported on 10/04/2019)  . [DISCONTINUED] Probiotic Product (PROBIOTIC-10 PO) Take 1 tablet by mouth daily.   No facility-administered encounter medications on file as of 10/04/2019.    Allergies (verified) Ciprofloxacin, Lipitor [atorvastatin calcium], Lisinopril, and Metformin and related   History: Past Medical History:  Diagnosis Date  . CAD (coronary artery disease)    a. 05/2014 [place] s/p DES x4 to mid-distLAD and DESx1 to DI  . Complication of anesthesia    11'12 Surgery with excrutiating "head about to explode" upon awakening  -lasted several hours postop  ( SURGERY DONE 08-13-2011 AT [facility] PT DID OK POST OP)  . Diverticulosis of colon    LEFT SIDE  . Exposure to Agent [facility] 10/18/2016   [place]  . History of small bowel obstruction    X2  28 YRS AGO AND LAST ONE 04/2014--  RESOLVED WITHOUT SURGICAL INTERVENTION  . Hyperlipidemia   . Hypertension   . Knee pain, bilateral 08/13/2011  . Left ureteral calculus   . Multiple lipomas    hx. mulitple and some remains -arms, legs  . Obesity, unspecified 09/18/2013  . Obstructive sleep apnea 01/01/2016  . Recurrent kidney stones 07/08/2013   Follows with Dr Gaynelle Arabian He believes they are Calcium oxalate stones  . Thrombocytopenia ([place]) 07/07/2014  . Type 2 diabetes mellitus ([place])   . Vitamin D deficiency 07/02/2014   Past Surgical History:  Procedure Laterality Date  . COLONOSCOPY W/ POLYPECTOMY  LAST ONE 2013  . CORONARY STENT PLACEMENT  05/17/2014   PROXIMAL X 4  MID & DISTAL LAD  DIAGONAL   . KNEE ARTHROSCOPY   08/13/2011   right ARTHROSCOPY KNEE;  Surgeon: Gearlean Alf, MD;  Location: [facility]S;  Service: Orthopedics;  Laterality: Right;  medial  meniscal debridement, excision of plica, synovectomy  . KNEE ARTHROSCOPY Left 11/16/2013   Procedure: LEFT KNEE ARTHROSCOPY MEDIAL MENISCAL DEBRIDEMENT, CHONDROPLASTY;  Surgeon: Gearlean Alf, MD;  Location: [facility];  Service: Orthopedics;  Laterality: Left;  . KNEE ARTHROSCOPY W/ MENISCECTOMY Right 01-29-2011   twice, first trimmed mediscus and cyst  . LEFT HEART CATHETERIZATION WITH CORONARY ANGIOGRAM N/A 05/17/2014   Procedure: LEFT HEART CATHETERIZATION WITH CORONARY ANGIOGRAM;  Surgeon: Burnell Blanks, MD;  Location[facility] C[facility] LAB;  Service: Cardiovascular;  Laterality: N/A;  . [place]   multiple, 10-12  . PERCUTANEOUS CORONARY STENT INTERVENTION (PCI-S)  05/17/2014   Procedure: PERCUTANEOUS CORONARY STENT INTERVENTION (PCI-S);  Surgeon: Burnell Blanks, MD;  Location: [facility] CATH LAB;  Service: Cardiovascular;;  Diag1 and Prox to Distal LAD   Family History  Problem Relation Age of Onset  . Hypertension Mother   . Cancer Mother 10  ovarian  . Heart disease Father        CHF  . Heart attack Father 44  . Cancer Sister        breast- just finished her last radiation  . Heart disease Maternal Grandfather        ?  [name] Heart attack Paternal Grandfather 43  . Heart attack Paternal Uncle 54       Sudden death  . Diabetes Neg Hx    Social History   Socioeconomic History  . Marital status: Married    Spouse name: Not on file  . Number of children: 2  . Years of education: Not on file  . Highest education level: Not on file  Occupational History  . Not on file  Tobacco Use  . Smoking status: Former Smoker    Packs/day: 1.00    Years: 4.00    Pack years: 4.00    Types: Cigarettes    Quit date: 06/14/1967    Years since quitting: 52.3  . Smokeless tobacco: Never Used  Substance and Sexual Activity  . Alcohol use: No     Alcohol/week: 0.0 standard drinks  . Drug use: No  . Sexual activity: Yes    Comment: lives with wife  Other Topics Concern  . 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     Social Determinants of Health   Financial Resource Strain: Low Risk   . Difficulty of Paying Living Expenses: Not hard at all  Food Insecurity: No Food Insecurity  . Worried About Charity fundraiser in the Last Year: Never true  . Ran Out of Food in the Last Year: Never true  Transportation Needs: No Transportation Needs  . Lack of Transportation (Medical): No  . Lack of Transportation (Non-Medical): No  Physical Activity:   . Days of Exercise per Week:   . Minutes of Exercise per Session:   Stress:   . Feeling of Stress :   Social Connections:   . Frequency of Communication with Friends and Family:   . Frequency of Social Gatherings with Friends and Family:   . Attends Religious Services:   . Active Member of Clubs or Organizations:   . Attends Archivist Meetings:   [name] Marital Status:     Tobacco Counseling Counseling given: Not Answered   Clinical Intake: Pain : No/denies pain    Activities of Daily Living In your present state of health, do you have any difficulty performing the following activities: 10/04/2019  Hearing? N  Vision? N  Difficulty concentrating or making decisions? N  Walking or climbing stairs? N  Dressing or bathing? N  Doing errands, shopping? N  Preparing Food and eating ? N  Using the Toilet? N  In the past six months, have you accidently leaked urine? N  Do you have problems with loss of bowel control? N  Managing your Medications? N  Managing your Finances? N  Housekeeping or managing your Housekeeping? N  Some recent data might be hidden    Patient Care Team: Mosie Lukes, MD as PCP - General (Family Medicine) Minus Breeding, MD as Consulting Physician (Cardiology) Philemon Kingdom, MD as  Consulting Physician (Endocrinology) Gaynelle Arabian, MD as Consulting Physician (Orthopedic Surgery) Troy Sine, MD as Consulting Physician (Cardiology) Luberta Mutter, MD as Consulting Physician (Ophthalmology) Clarene Essex, MD as Consulting Physician (Gastroenterology) [place], Jennefer Bravo, MD as Consulting Physician (Dermatology) Trula Slade, DPM as  Consulting Physician (Podiatry) Roseanne Kaufman, MD as Consulting Physician (Orthopedic Surgery)  Indicate any recent Medical Services you may have received from other than [facility] providers in the past year (date may be approximate).     Assessment:   This is a routine wellness examination for [place].  Dietary issues and exercise activities discussed: Current Exercise Habits: The patient does not participate in regular exercise at present, Exercise limited by: None identified Diet (meal preparation, eat out, water intake, caffeinated beverages, dairy products, fruits and vegetables): in general, a "healthy" diet  , well balanced  Goals    . DIET - INCREASE WATER INTAKE     Drink at least 3 glasses of water per day.    . Increase physical activity     Start using your exercise machine for at least 25 minutes 2-3 days weekly.      Depression Screen PHQ 2/9 Scores 10/04/2019 09/20/2017 09/16/2016 01/01/2016 07/01/2015 09/11/2014 06/11/2014  PHQ - 2 Score 0 0 0 0 0 0 0    Fall Risk Fall Risk  10/04/2019 09/20/2017 09/16/2016 07/01/2015 04/16/2014  Falls in the past year? 0 No No No No  Number falls in past yr: 0 - - - -  Injury with Fall? 0 - - - -  Follow up Education provided;Falls prevention discussed - - - -   Lives with wife in 2 story home. Master on 1st floor.  Any stairs in or around the home? Yes  If so, are there any without handrails? No  Home free of loose throw rugs in walkways, pet beds, electrical cords, etc? Yes  Adequate lighting in your home to reduce risk of falls? Yes   ASSISTIVE DEVICES UTILIZED TO PREVENT  FALLS:  Life alert? No  Use of a cane, walker or w/c? No  Grab bars in the bathroom? Yes  Shower chair or bench in shower? Yes  Elevated toilet seat or a handicapped toilet? Yes    Cognitive Function: Ad8 score reviewed for issues:  Issues making decisions:no  Less interest in hobbies / activities:no  Repeats questions, stories (family complaining):no  Trouble using ordinary gadgets (microwave, computer, phone):no  Forgets the month or year: no  Mismanaging finances: no  Remembering appts:no  Daily problems with thinking and/or memory:no Ad8 score is=0     MMSE - Mini Mental State Exam 09/16/2016  Orientation to time 5  Orientation to Place 5  Registration 3  Attention/ Calculation 5  Recall 3  Language- name 2 objects 2  Language- repeat 1  Language- follow 3 step command 3  Language- read & follow direction 1  Write a sentence 1  Copy design 1  Total score 30        Immunizations Immunization History  Administered Date(s) Administered  . Hepatitis A 10/18/2003  . Hepatitis B 10/18/2003, 11/29/2003  . Influenza, High Dose Seasonal PF 12/19/2015, 12/28/2016, 12/12/2018  . Influenza,inj,Quad PF,6+ Mos 01/10/2014, 12/26/2014  . Influenza,inj,quad, With Preservative 12/28/2016  . Influenza-Unspecified 11/19/2010, 12/03/2011, 12/12/2012  . Mumps 01/29/2004  . PFIZER SARS-COV-2 Vaccination 04/19/2019, 05/14/2019  . Pneumococcal Conjugate-13 01/10/2014  . Pneumococcal Polysaccharide-23 10/18/2003, 03/31/2015  . Td 05/13/1997, 05/21/2009  . Tdap 12/18/2015  . Zoster 06/06/2009  . Zoster Recombinat (Shingrix) 05/17/2017, 09/07/2017    TDAP status: Up to date Flu Vaccine status: Up to date Pneumococcal vaccine status: Up to date Covid-19 vaccine status: Completed vaccines  Qualifies for Shingles Vaccine? Yes   Zostavax completed Yes   Shingrix Completed?: Yes 05/17/17 and 09/07/17  Screening Tests Health Maintenance  Topic Date Due  . OPHTHALMOLOGY  EXAM  10/26/2018  . INFLUENZA VACCINE  10/14/2019  . HEMOGLOBIN A1C  02/09/2020  . FOOT EXAM  07/09/2020  . COLONOSCOPY  07/13/2021  . TETANUS/TDAP  12/17/2025  . COVID-19 Vaccine  Completed  . Hepatitis C Screening  Completed  . PNA vac Low Risk Adult  Completed    Health Maintenance  Health Maintenance Due  Topic Date Due  . OPHTHALMOLOGY EXAM  10/26/2018    Colorectal cancer screening: Completed 07/14/11. Repeat every (not on file) years  Lung Cancer Screening: (Low Dose CT Chest recommended if Age 58-80 years, 30 pack-year currently smoking OR have quit w/in 15years.) does not qualify.    Additional Screening: Vision Screening: Recommended annual ophthalmology exams for early detection of glaucoma and other disorders of the eye. Is the patient up to date with their annual eye exam?  Yes  Who is the provider or what is the name of the office in which the patient attends annual eye exams? Dr.McCuen   Dental Screening: Recommended annual dental exams for proper oral hygiene  Community Resource Referral / Chronic Care Management: CRR required this visit?  No   CCM required this visit?  No      Plan:    Please schedule your next medicare wellness visit with me in 1 yr.  Continue to eat heart healthy diet (full of fruits, vegetables, whole grains, lean protein, water--limit salt, fat, and sugar intake) and increase physical activity as tolerated.  Continue doing brain stimulating activities (puzzles, reading, adult coloring books, staying active) to keep memory sharp.     I have personally reviewed and noted the following in the patient's chart:   . Medical and social history . Use of alcohol, tobacco or illicit drugs  . Current medications and supplements . Functional ability and status . Nutritional status . Physical activity . Advanced directives . List of other physicians . Hospitalizations, surgeries, and ER visits in previous 12  months . Vitals . Screenings to include cognitive, depression, and falls . Referrals and appointments  In addition, I have reviewed and discussed with patient certain preventive protocols, quality metrics, and best practice recommendations. A written personalized care plan for preventive services as well as general preventive health recommendations were provided to patient.   Due to this being a telephonic visit, the after visit summary with patients personalized plan was offered to patient via mail or my-chart.Patient would like to access on my-chart   Shela Nevin, [place]   10/04/2019

## 2019-10-04 ENCOUNTER — Encounter: Payer: Self-pay | Admitting: *Deleted

## 2019-10-04 ENCOUNTER — Other Ambulatory Visit: Payer: Self-pay

## 2019-10-04 ENCOUNTER — Ambulatory Visit (INDEPENDENT_AMBULATORY_CARE_PROVIDER_SITE_OTHER): Payer: Medicare HMO | Admitting: *Deleted

## 2019-10-04 DIAGNOSIS — Z0000 Encounter for general adult medical examination without abnormal findings: Secondary | ICD-10-CM

## 2019-10-04 NOTE — Patient Instructions (Addendum)
Please schedule your next medicare wellness visit with me in 1 yr.  Continue to eat heart healthy diet (full of fruits, vegetables, whole grains, lean protein, water--limit salt, fat, and sugar intake) and increase physical activity as tolerated.  Continue doing brain stimulating activities (puzzles, reading, adult coloring books, staying active) to keep memory sharp.    [name] , Thank you for taking time to come for your Medicare Wellness Visit. I appreciate your ongoing commitment to your health goals. Please review the following plan we discussed and let me know if I can assist you in the future.   These are the goals we discussed: Goals    . DIET - INCREASE WATER INTAKE     Drink at least 3 glasses of water per day.    . Increase physical activity     Start using your exercise machine for at least 25 minutes 2-3 days weekly.       This is a list of the screening recommended for you and due dates:  Health Maintenance  Topic Date Due  . Eye exam for diabetics  10/26/2018  . Flu Shot  10/14/2019  . Hemoglobin A1C  02/09/2020  . Complete foot exam   07/09/2020  . Colon Cancer Screening  07/13/2021  . Tetanus Vaccine  12/17/2025  . COVID-19 Vaccine  Completed  .  Hepatitis C: One time screening is recommended by Center for Disease Control  (CDC) for  adults born from [birth year] through 1966.   Completed  . Pneumonia vaccines  Completed    Preventive Care 36 Years and Older, Male Preventive care refers to lifestyle choices and visits with your health care provider that can promote health and wellness. This includes:  A yearly physical exam. This is also called an annual well check.  Regular dental and eye exams.  Immunizations.  Screening for certain conditions.  Healthy lifestyle choices, such as diet and exercise. What can I expect for my preventive care visit? Physical exam Your health care provider will check:  Height and weight. These may be used to calculate  body mass index (BMI), which is a measurement that tells if you are at a healthy weight.  Heart rate and blood pressure.  Your skin for abnormal spots. Counseling Your health care provider may ask you questions about:  Alcohol, tobacco, and drug use.  Emotional well-being.  Home and relationship well-being.  Sexual activity.  Eating habits.  History of falls.  Memory and ability to understand (cognition).  Work and work Statistician. What immunizations do I need?  Influenza (flu) vaccine  This is recommended every year. Tetanus, diphtheria, and pertussis (Tdap) vaccine  You may need a Td booster every 10 years. Varicella (chickenpox) vaccine  You may need this vaccine if you have not already been vaccinated. Zoster (shingles) vaccine  You may need this after age 71. Pneumococcal conjugate (PCV13) vaccine  One dose is recommended after age 82. Pneumococcal polysaccharide (PPSV23) vaccine  One dose is recommended after age 2. Measles, mumps, and rubella (MMR) vaccine  You may need at least one dose of MMR if you were born in 1957 or later. You may also need a second dose. Meningococcal conjugate (MenACWY) vaccine  You may need this if you have certain conditions. Hepatitis A vaccine  You may need this if you have certain conditions or if you travel or work in places where you may be exposed to hepatitis A. Hepatitis B vaccine  You may need this if you have  certain conditions or if you travel or work in places where you may be exposed to hepatitis B. Haemophilus influenzae type b (Hib) vaccine  You may need this if you have certain conditions. You may receive vaccines as individual doses or as more than one vaccine together in one shot (combination vaccines). Talk with your health care provider about the risks and benefits of combination vaccines. What tests do I need? Blood tests  Lipid and cholesterol levels. These may be checked every 5 years, or more  frequently depending on your overall health.  Hepatitis C test.  Hepatitis B test. Screening  Lung cancer screening. You may have this screening every year starting at age 71 if you have a 30-pack-year history of smoking and currently smoke or have quit within the past 15 years.  Colorectal cancer screening. All adults should have this screening starting at age 52 and continuing until age 35. Your health care provider may recommend screening at age 28 if you are at increased risk. You will have tests every 1-10 years, depending on your results and the type of screening test.  Prostate cancer screening. Recommendations will vary depending on your family history and other risks.  Diabetes screening. This is done by checking your blood sugar (glucose) after you have not eaten for a while (fasting). You may have this done every 1-3 years.  Abdominal aortic aneurysm (AAA) screening. You may need this if you are a current or former smoker.  Sexually transmitted disease (STD) testing. Follow these instructions at home: Eating and drinking  Eat a diet that includes fresh fruits and vegetables, whole grains, lean protein, and low-fat dairy products. Limit your intake of foods with high amounts of sugar, saturated fats, and salt.  Take vitamin and mineral supplements as recommended by your health care provider.  Do not drink alcohol if your health care provider tells you not to drink.  If you drink alcohol: ? Limit how much you have to 0-2 drinks a day. ? Be aware of how much alcohol is in your drink. In the [place] one drink equals one 12 oz bottle of beer (355 mL), one 5 oz glass of wine (148 mL), or one 1 oz glass of hard liquor (44 mL). Lifestyle  Take daily care of your teeth and gums.  Stay active. Exercise for at least 30 minutes on 5 or more days each week.  Do not use any products that contain nicotine or tobacco, such as cigarettes, e-cigarettes, and chewing tobacco. If you need  help quitting, ask your health care provider.  If you are sexually active, practice safe sex. Use a condom or other form of protection to prevent STIs (sexually transmitted infections).  Talk with your health care provider about taking a low-dose aspirin or statin. What's next?  Visit your health care provider once a year for a well check visit.  Ask your health care provider how often you should have your eyes and teeth checked.  Stay up to date on all vaccines. This information is not intended to replace advice given to you by your health care provider. Make sure you discuss any questions you have with your health care provider. Document Revised: 02/23/2018 Document Reviewed: 02/23/2018 Elsevier Patient Education  2020 [employer].

## 2019-11-06 DIAGNOSIS — H2513 Age-related nuclear cataract, bilateral: Secondary | ICD-10-CM | POA: Diagnosis not present

## 2019-11-06 DIAGNOSIS — H52203 Unspecified astigmatism, bilateral: Secondary | ICD-10-CM | POA: Diagnosis not present

## 2019-11-06 DIAGNOSIS — H35372 Puckering of macula, left eye: Secondary | ICD-10-CM | POA: Diagnosis not present

## 2019-11-06 DIAGNOSIS — D3131 Benign neoplasm of right choroid: Secondary | ICD-10-CM | POA: Diagnosis not present

## 2019-11-06 LAB — HM DIABETES EYE EXAM

## 2019-11-16 NOTE — Addendum Note (Signed)
Addended by: Kelle Darting A on: 11/16/2019 11:00 AM   Modules accepted: Orders

## 2019-11-20 ENCOUNTER — Other Ambulatory Visit: Payer: Medicare HMO

## 2019-11-21 ENCOUNTER — Other Ambulatory Visit: Payer: Self-pay

## 2019-11-21 ENCOUNTER — Other Ambulatory Visit (INDEPENDENT_AMBULATORY_CARE_PROVIDER_SITE_OTHER): Payer: Medicare HMO

## 2019-11-21 DIAGNOSIS — E782 Mixed hyperlipidemia: Secondary | ICD-10-CM | POA: Diagnosis not present

## 2019-11-21 DIAGNOSIS — E559 Vitamin D deficiency, unspecified: Secondary | ICD-10-CM

## 2019-11-21 DIAGNOSIS — E1142 Type 2 diabetes mellitus with diabetic polyneuropathy: Secondary | ICD-10-CM

## 2019-11-21 DIAGNOSIS — I10 Essential (primary) hypertension: Secondary | ICD-10-CM | POA: Diagnosis not present

## 2019-11-22 LAB — CBC
HCT: 43.7 % (ref 38.5–50.0)
Hemoglobin: 15.1 g/dL (ref 13.2–17.1)
MCH: 32.3 pg (ref 27.0–33.0)
MCHC: 34.6 g/dL (ref 32.0–36.0)
MCV: 93.4 fL (ref 80.0–100.0)
MPV: 10.6 fL (ref 7.5–12.5)
Platelets: 152 10*3/uL (ref 140–400)
RBC: 4.68 10*6/uL (ref 4.20–5.80)
RDW: 13.7 % (ref 11.0–15.0)
WBC: 5.9 10*3/uL (ref 3.8–10.8)

## 2019-11-22 LAB — LIPID PANEL
Cholesterol: 159 mg/dL (ref <200)
HDL: 34 mg/dL — ABNORMAL LOW (ref >=40)
LDL Cholesterol (Calc): 98 mg/dL (calc)
Non-HDL Cholesterol (Calc): 125 mg/dL (calc) (ref <130)
Total CHOL/HDL Ratio: 4.7 (calc) (ref <5.0)
Triglycerides: 175 mg/dL — ABNORMAL HIGH (ref <150)

## 2019-11-22 LAB — COMPREHENSIVE METABOLIC PANEL
AG Ratio: 1.5 (calc) (ref 1.0–2.5)
ALT: 25 U/L (ref 9–46)
AST: 23 U/L (ref 10–35)
Albumin: 4.2 g/dL (ref 3.6–5.1)
Alkaline phosphatase (APISO): 85 U/L (ref 35–144)
BUN: 16 mg/dL (ref 7–25)
CO2: 26 mmol/L (ref 20–32)
Calcium: 9.3 mg/dL (ref 8.6–10.3)
Chloride: 102 mmol/L (ref 98–110)
Creat: 1.03 mg/dL (ref 0.70–1.18)
Globulin: 2.8 g/dL (calc) (ref 1.9–3.7)
Glucose, Bld: 162 mg/dL — ABNORMAL HIGH (ref 65–99)
Potassium: 4.4 mmol/L (ref 3.5–5.3)
Sodium: 137 mmol/L (ref 135–146)
Total Bilirubin: 0.8 mg/dL (ref 0.2–1.2)
Total Protein: 7 g/dL (ref 6.1–8.1)

## 2019-11-22 LAB — HEMOGLOBIN A1C
Hgb A1c MFr Bld: 7.8 % of total Hgb — ABNORMAL HIGH (ref <5.7)
Mean Plasma Glucose: 177 (calc)
eAG (mmol/L): 9.8 (calc)

## 2019-11-22 LAB — VITAMIN D 25 HYDROXY (VIT D DEFICIENCY, FRACTURES): Vit D, 25-Hydroxy: 38 ng/mL (ref 30–100)

## 2019-11-22 LAB — TSH: TSH: 2.78 mIU/L (ref 0.40–4.50)

## 2019-11-27 ENCOUNTER — Other Ambulatory Visit: Payer: Self-pay

## 2019-11-27 ENCOUNTER — Other Ambulatory Visit: Payer: Self-pay | Admitting: Family Medicine

## 2019-11-27 ENCOUNTER — Ambulatory Visit (INDEPENDENT_AMBULATORY_CARE_PROVIDER_SITE_OTHER): Payer: Medicare HMO | Admitting: Family Medicine

## 2019-11-27 VITALS — BP 122/68 | HR 62 | Temp 97.7°F | Resp 12 | Ht 71.0 in | Wt 263.0 lb

## 2019-11-27 DIAGNOSIS — I10 Essential (primary) hypertension: Secondary | ICD-10-CM

## 2019-11-27 DIAGNOSIS — E782 Mixed hyperlipidemia: Secondary | ICD-10-CM | POA: Diagnosis not present

## 2019-11-27 DIAGNOSIS — E559 Vitamin D deficiency, unspecified: Secondary | ICD-10-CM | POA: Diagnosis not present

## 2019-11-27 DIAGNOSIS — J329 Chronic sinusitis, unspecified: Secondary | ICD-10-CM

## 2019-11-27 DIAGNOSIS — E1142 Type 2 diabetes mellitus with diabetic polyneuropathy: Secondary | ICD-10-CM | POA: Diagnosis not present

## 2019-11-27 MED ORDER — CETIRIZINE HCL 10 MG PO TABS: 10.0000 mg | ORAL_TABLET | Freq: Every morning | ORAL | 3 refills | Status: DC

## 2019-11-27 MED ORDER — FAMOTIDINE 20 MG PO TABS: 20.0000 mg | ORAL_TABLET | Freq: Every day | ORAL | 3 refills | Status: DC

## 2019-11-27 MED ORDER — AMOXICILLIN 500 MG PO CAPS: 500.0000 mg | ORAL_CAPSULE | Freq: Three times a day (TID) | ORAL | 0 refills | Status: DC

## 2019-11-27 MED FILL — CETIRIZINE HCL 10 MG TABS: 10 | 100 days supply | Qty: 100 | Fill #0

## 2019-11-27 MED FILL — AMOXICILLIN 500 MG CAPSULE: 500 | 10 days supply | Qty: 30 | Fill #0

## 2019-11-27 MED FILL — FAMOTIDINE 20 MG TABS: 20 | 30 days supply | Qty: 30 | Fill #0

## 2019-11-27 NOTE — Patient Instructions (Signed)
Carbohydrate Counting for Diabetes Mellitus, Adult  Carbohydrate counting is a method of keeping track of how many carbohydrates you eat. Eating carbohydrates naturally increases the amount of sugar (glucose) in the blood. Counting how many carbohydrates you eat helps keep your blood glucose within normal limits, which helps you manage your diabetes (diabetes mellitus). It is important to know how many carbohydrates you can safely have in each meal. This is different for every person. A diet and nutrition specialist (registered dietitian) can help you make a meal plan and calculate how many carbohydrates you should have at each meal and snack. Carbohydrates are found in the following foods:  Grains, such as breads and cereals.  Dried beans and soy products.  Starchy vegetables, such as potatoes, peas, and corn.  Fruit and fruit juices.  Milk and yogurt.  Sweets and snack foods, such as cake, cookies, candy, chips, and soft drinks. How do I count carbohydrates? There are two ways to count carbohydrates in food. You can use either of the methods or a combination of both. Reading "Nutrition Facts" on packaged food The "Nutrition Facts" list is included on the labels of almost all packaged foods and beverages in the [place] It includes:  The serving size.  Information about nutrients in each serving, including the grams (g) of carbohydrate per serving. To use the "Nutrition Facts":  Decide how many servings you will have.  Multiply the number of servings by the number of carbohydrates per serving.  The resulting number is the total amount of carbohydrates that you will be having. Learning standard serving sizes of other foods When you eat carbohydrate foods that are not packaged or do not include "Nutrition Facts" on the label, you need to measure the servings in order to count the amount of carbohydrates:  Measure the foods that you will eat with a food scale or measuring cup, if  needed.  Decide how many standard-size servings you will eat.  Multiply the number of servings by 15. Most carbohydrate-rich foods have about 15 g of carbohydrates per serving. ? For example, if you eat 8 oz (170 g) of strawberries, you will have eaten 2 servings and 30 g of carbohydrates (2 servings x 15 g = 30 g).  For foods that have more than one food mixed, such as soups and casseroles, you must count the carbohydrates in each food that is included. The following list contains standard serving sizes of common carbohydrate-rich foods. Each of these servings has about 15 g of carbohydrates:   hamburger bun or  English muffin.   oz (15 mL) syrup.   oz (14 g) jelly.  1 slice of bread.  1 six-inch tortilla.  3 oz (85 g) cooked rice or pasta.  4 oz (113 g) cooked dried beans.  4 oz (113 g) starchy vegetable, such as peas, corn, or potatoes.  4 oz (113 g) hot cereal.  4 oz (113 g) mashed potatoes or  of a large baked potato.  4 oz (113 g) canned or frozen fruit.  4 oz (120 mL) fruit juice.  4-6 crackers.  6 chicken nuggets.  6 oz (170 g) unsweetened dry cereal.  6 oz (170 g) plain fat-free yogurt or yogurt sweetened with artificial sweeteners.  8 oz (240 mL) milk.  8 oz (170 g) fresh fruit or one small piece of fruit.  24 oz (680 g) popped popcorn. Example of carbohydrate counting Sample meal  3 oz (85 g) chicken breast.  6 oz (170 g)   brown rice.  4 oz (113 g) corn.  8 oz (240 mL) milk.  8 oz (170 g) strawberries with sugar-free whipped topping. Carbohydrate calculation 1. Identify the foods that contain carbohydrates: ? Rice. ? Corn. ? Milk. ? Strawberries. 2. Calculate how many servings you have of each food: ? 2 servings rice. ? 1 serving corn. ? 1 serving milk. ? 1 serving strawberries. 3. Multiply each number of servings by 15 g: ? 2 servings rice x 15 g = 30 g. ? 1 serving corn x 15 g = 15 g. ? 1 serving milk x 15 g = 15 g. ? 1  serving strawberries x 15 g = 15 g. 4. Add together all of the amounts to find the total grams of carbohydrates eaten: ? 30 g + 15 g + 15 g + 15 g = 75 g of carbohydrates total. Summary  Carbohydrate counting is a method of keeping track of how many carbohydrates you eat.  Eating carbohydrates naturally increases the amount of sugar (glucose) in the blood.  Counting how many carbohydrates you eat helps keep your blood glucose within normal limits, which helps you manage your diabetes.  A diet and nutrition specialist (registered dietitian) can help you make a meal plan and calculate how many carbohydrates you should have at each meal and snack. This information is not intended to replace advice given to you by your health care provider. Make sure you discuss any questions you have with your health care provider. Document Revised: 09/23/2016 Document Reviewed: 08/13/2015 Elsevier Patient Education  2020 Elsevier Inc.  

## 2019-11-28 DIAGNOSIS — J329 Chronic sinusitis, unspecified: Secondary | ICD-10-CM | POA: Insufficient documentation

## 2019-11-28 NOTE — Assessment & Plan Note (Signed)
Has been struggling with increased congestion and PND for past month and it has not improved. Will start Amoxicillin and continue mucinex. Consider Zyrtec and Pepcid if globus and PND persist.

## 2019-11-28 NOTE — Assessment & Plan Note (Signed)
Encouraged heart healthy diet, increase exercise, avoid trans fats, consider a krill oil cap daily 

## 2019-11-28 NOTE — Assessment & Plan Note (Signed)
hgba1c acceptable, minimize simple carbs. Increase exercise as tolerated. Continue current meds 

## 2019-11-28 NOTE — Assessment & Plan Note (Signed)
Well controlled, no changes to meds. Encouraged heart healthy diet such as the DASH diet and exercise as tolerated. He has noted some BP numbers 130 to 150s at home and one number at 170. He reports these numbers are not at rest and he will now monitor at rest numbers.

## 2019-11-28 NOTE — Assessment & Plan Note (Signed)
Supplement and monitor 

## 2019-11-28 NOTE — Progress Notes (Signed)
Subjective:    Patient ID: Michael Gavel., male    DOB: June 11, 1945, 74 y.o.   MRN: [id number]  Chief Complaint  Patient presents with  . Follow-up    HPI Patient is in today for follow up on chronic medical concerns. He is accompanied by his wife. He has been struggling with increased head congestion, PND and globus for about 1 month. No fevers or chills. No other acute concerns. His sugars are doing much better. No polyuria or polydipsia. Denies CP/palp/SOB/HA/fevers/GI or GU c/o. Taking meds as prescribed  Past Medical History:  Diagnosis Date  . CAD (coronary artery disease)    a. 05/2014 [place] s/p DES x4 to mid-distLAD and DESx1 to DI  . Complication of anesthesia    11'12 Surgery with excrutiating "head about to explode" upon awakening  -lasted several hours postop  ( SURGERY DONE 08-13-2011 AT [facility] PT DID OK POST OP)  . Diverticulosis of colon    LEFT SIDE  . Exposure to Agent [facility] 10/18/2016   [place]  . History of small bowel obstruction    X2  28 YRS AGO AND LAST ONE 04/2014--  RESOLVED WITHOUT SURGICAL INTERVENTION  . Hyperlipidemia   . Hypertension   . Knee pain, bilateral 08/13/2011  . Left ureteral calculus   . Multiple lipomas    hx. mulitple and some remains -arms, legs  . Obesity, unspecified 09/18/2013  . Obstructive sleep apnea 01/01/2016  . Recurrent kidney stones 07/08/2013   Follows with Dr Gaynelle Arabian He believes they are Calcium oxalate stones  . Thrombocytopenia ([place]) 07/07/2014  . Type 2 diabetes mellitus ([place])   . Vitamin D deficiency 07/02/2014    Past Surgical History:  Procedure Laterality Date  . COLONOSCOPY W/ POLYPECTOMY  LAST ONE 2013  . CORONARY STENT PLACEMENT  05/17/2014   PROXIMAL X 4  MID & DISTAL LAD  DIAGONAL   . KNEE ARTHROSCOPY  08/13/2011   right ARTHROSCOPY KNEE;  Surgeon: Gearlean Alf, MD;  Location: [facility];  Service: Orthopedics;  Laterality: Right;  medial  meniscal debridement, excision of plica, synovectomy  . KNEE ARTHROSCOPY  Left 11/16/2013   Procedure: LEFT KNEE ARTHROSCOPY MEDIAL MENISCAL DEBRIDEMENT, CHONDROPLASTY;  Surgeon: Gearlean Alf, MD;  Location: [facility];  Service: Orthopedics;  Laterality: Left;  . KNEE ARTHROSCOPY W/ MENISCECTOMY Right 01-29-2011   twice, first trimmed mediscus and cyst  . LEFT HEART CATHETERIZATION WITH CORONARY ANGIOGRAM N/A 05/17/2014   Procedure: LEFT HEART CATHETERIZATION WITH CORONARY ANGIOGRAM;  Surgeon: Burnell Blanks, MD;  Location: [facility] CATH LAB;  Service: Cardiovascular;  Laterality: N/A;  . [place]   multiple, 10-12  . PERCUTANEOUS CORONARY STENT INTERVENTION (PCI-S)  05/17/2014   Procedure: PERCUTANEOUS CORONARY STENT INTERVENTION (PCI-S);  Surgeon: Burnell Blanks, MD;  Location: [facility] CATH LAB;  Service: Cardiovascular;;  Diag1 and Prox to Distal LAD    Family History  Problem Relation Age of Onset  . Hypertension Mother   . Cancer Mother 57       ovarian  . Heart disease Father        CHF  . Heart attack Father 81  . Cancer Sister        breast- just finished her last radiation  . Heart disease Maternal Grandfather        ?  [name] Heart attack Paternal Grandfather 29  . Heart attack Paternal Uncle 3       Sudden death  . Diabetes Neg Hx  Social History   Socioeconomic History  . Marital status: Married    Spouse name: Not on file  . Number of children: 2  . Years of education: Not on file  . Highest education level: Not on file  Occupational History  . Not on file  Tobacco Use  . Smoking status: Former Smoker    Packs/day: 1.00    Years: 4.00    Pack years: 4.00    Types: Cigarettes    Quit date: 06/14/1967    Years since quitting: 52.4  . Smokeless tobacco: Never Used  Substance and Sexual Activity  . Alcohol use: No    Alcohol/week: 0.0 standard drinks  . Drug use: No  . Sexual activity: Yes    Comment: lives with wife  Other Topics Concern  . Not on file  Social History Narrative   Retired Systems analyst improvement.   Lives with wife.     Social Determinants of Health   Financial Resource Strain: Low Risk   . Difficulty of Paying Living Expenses: Not hard at all  Food Insecurity: No Food Insecurity  . Worried About Charity fundraiser in the Last Year: Never true  . Ran Out of Food in the Last Year: Never true  Transportation Needs: No Transportation Needs  . Lack of Transportation (Medical): No  . Lack of Transportation (Non-Medical): No  Physical Activity:   . Days of Exercise per Week: Not on file  . Minutes of Exercise per Session: Not on file  Stress:   . Feeling of Stress : Not on file  Social Connections:   . Frequency of Communication with Friends and Family: Not on file  . Frequency of Social Gatherings with Friends and Family: Not on file  . Attends Religious Services: Not on file  . Active Member of Clubs or Organizations: Not on file  . Attends Archivist Meetings: Not on file  . Marital Status: Not on file  Intimate Partner Violence:   . Fear of Current or Ex-Partner: Not on file  . Emotionally Abused: Not on file  . Physically Abused: Not on file  . Sexually Abused: Not on file    Outpatient Medications Prior to Visit  Medication Sig Dispense Refill  . ACCU-CHEK FASTCLIX LANCETS MISC Use to test blood sugar 2 times daily. Dx: E11.40 204 each 3  . acetaminophen (TYLENOL) 500 MG tablet Take 1,000 mg by mouth every 6 (six) hours as needed for mild pain. Reported on 03/31/2015    . aspirin EC 81 MG tablet Take 81 mg by mouth daily with breakfast.     . Cholecalciferol (VITAMIN D PO) Take 5,000 Units by mouth daily.    [name] glucagon (GLUCAGON EMERGENCY) 1 MG injection Inject 1 mg into the muscle once as needed for up to 1 dose. 1 each 12  . glucose blood (ACCU-CHEK AVIVA PLUS) test strip Use to test blood sugar 2 times daily. Dx: E11.40 200 each 3  . insulin NPH Human (HUMULIN N) 100 UNIT/ML injection INJECT UNDER SKIN 20 UNITS IN THE AM AND 44 UNITS AT BEDTIME. (Patient  taking differently: INJECT UNDER SKIN 20 UNITS IN THE AM AND 47 UNITS AT BEDTIME.) 30 mL 3  . insulin regular (HUMULIN R) 100 units/mL injection Inject 0.2-0.35 mLs (20-35 Units total) into the skin 3 (three) times daily before meals. 30 mL 3  . Insulin Syringe-Needle U-100 (TRUEPLUS INSULIN SYRINGE) 30G X 5/16" 0.5 ML MISC USE TO INJECT INSULIN 5 TIMES DAILY. [place]  each 3  . losartan (COZAAR) 50 MG tablet TAKE 1&1/2 (75 MG TOTAL) TABLETS BY MOUTH EVERY MORNING (Patient taking differently: Take 25 mg by mouth daily. TAKE 3- 25 MG TABLETS BY MOUTH EVERY MORNING) 180 tablet 3  . metoprolol tartrate (LOPRESSOR) 25 MG tablet Take 1 tablet (25 mg total) by mouth 2 (two) times daily. 180 tablet 3  . Multiple Vitamin (MULTIVITAMIN WITH MINERALS) TABS tablet Take 1 tablet by mouth daily.    . nitroGLYCERIN (NITROSTAT) 0.4 MG SL tablet Place 1 tablet (0.4 mg total) under the tongue every 5 (five) minutes as needed for chest pain. 25 tablet 3  . Polyethylene Glycol 3350 (MIRALAX PO) Take by mouth as needed. Reported on 03/31/2015    . prasugrel (EFFIENT) 10 MG TABS tablet Take 1 tablet (10 mg total) by mouth daily. 90 tablet 3  . simvastatin (ZOCOR) 40 MG tablet TAKE 1 TABLET (40 MG) BY MOUTH AT BEDTIME. 90 tablet 2   No facility-administered medications prior to visit.    Allergies  Allergen Reactions  . Ciprofloxacin Other (See Comments)    Abdominal pain  . Lipitor [Atorvastatin Calcium] Other (See Comments)    Severe muscle pain  . Lisinopril Cough  . Metformin And Related Diarrhea    Review of Systems  Constitutional: Negative for fever and malaise/fatigue.  HENT: Positive for congestion.   Eyes: Negative for blurred vision.  Respiratory: Positive for cough and sputum production. Negative for shortness of breath.   Cardiovascular: Negative for chest pain, palpitations and leg swelling.  Gastrointestinal: Negative for abdominal pain, blood in stool and nausea.  Genitourinary: Negative for  dysuria and frequency.  Musculoskeletal: Negative for falls.  Skin: Negative for rash.  Neurological: Negative for dizziness, loss of consciousness and headaches.  Endo/Heme/Allergies: Negative for environmental allergies.  Psychiatric/Behavioral: Negative for depression. The patient is not nervous/anxious.        Objective:    Physical Exam Vitals and nursing note reviewed.  Constitutional:      General: He is not in acute distress.    Appearance: He is well-developed.  HENT:     Head: Normocephalic and atraumatic.     Nose: Nose normal.  Eyes:     General:        Right eye: No discharge.        Left eye: No discharge.  Cardiovascular:     Rate and Rhythm: Normal rate and regular rhythm.     Heart sounds: No murmur heard.   Pulmonary:     Effort: Pulmonary effort is normal.     Breath sounds: Normal breath sounds.  Abdominal:     General: Bowel sounds are normal.     Palpations: Abdomen is soft.     Tenderness: There is no abdominal tenderness.  Musculoskeletal:     Cervical back: Normal range of motion and neck supple.  Skin:    General: Skin is warm and dry.  Neurological:     Mental Status: He is alert and oriented to person, place, and time.     BP 122/68 (BP Location: Left Arm, Patient Position: Sitting, Cuff Size: Large)   Pulse 62   Temp 97.7 F (36.5 C) (Oral)   Resp 12   Ht 5\' 11"  (1.803 m)   Wt 263 lb (119.3 kg)   SpO2 95%   BMI 36.68 kg/m  Wt Readings from Last 3 Encounters:  11/27/19 263 lb (119.3 kg)  08/28/19 264 lb (119.7 kg)  08/16/19 257 lb 6.4  oz (116.8 kg)    Diabetic Foot Exam - Simple   No data filed     Lab Results  Component Value Date   WBC 5.9 11/21/2019   HGB 15.1 11/21/2019   HCT 43.7 11/21/2019   PLT 152 11/21/2019   GLUCOSE 162 (H) 11/21/2019   CHOL 159 11/21/2019   TRIG 175 (H) 11/21/2019   HDL 34 (L) 11/21/2019   LDLDIRECT 108.0 04/16/2014   LDLCALC 98 11/21/2019   ALT 25 11/21/2019   AST 23 11/21/2019   NA  137 11/21/2019   K 4.4 11/21/2019   CL 102 11/21/2019   CREATININE 1.03 11/21/2019   BUN 16 11/21/2019   CO2 26 11/21/2019   TSH 2.78 11/21/2019   PSA 1.92 09/25/2018   INR 1.02 05/17/2014   HGBA1C 7.8 (H) 11/21/2019   MICROALBUR 1.7 06/24/2015    Lab Results  Component Value Date   TSH 2.78 11/21/2019   Lab Results  Component Value Date   WBC 5.9 11/21/2019   HGB 15.1 11/21/2019   HCT 43.7 11/21/2019   MCV 93.4 11/21/2019   PLT 152 11/21/2019   Lab Results  Component Value Date   NA 137 11/21/2019   K 4.4 11/21/2019   CO2 26 11/21/2019   GLUCOSE 162 (H) 11/21/2019   BUN 16 11/21/2019   CREATININE 1.03 11/21/2019   BILITOT 0.8 11/21/2019   ALKPHOS 81 08/09/2019   AST 23 11/21/2019   ALT 25 11/21/2019   PROT 7.0 11/21/2019   ALBUMIN 4.1 08/09/2019   CALCIUM 9.3 11/21/2019   ANIONGAP 8 05/18/2014   GFR 95.59 08/09/2019   Lab Results  Component Value Date   CHOL 159 11/21/2019   Lab Results  Component Value Date   HDL 34 (L) 11/21/2019   Lab Results  Component Value Date   LDLCALC 98 11/21/2019   Lab Results  Component Value Date   TRIG 175 (H) 11/21/2019   Lab Results  Component Value Date   CHOLHDL 4.7 11/21/2019   Lab Results  Component Value Date   HGBA1C 7.8 (H) 11/21/2019       Assessment & Plan:   Problem List Items Addressed This Visit    Hypertension - Primary (Chronic)    Well controlled, no changes to meds. Encouraged heart healthy diet such as the DASH diet and exercise as tolerated. He has noted some BP numbers 130 to 150s at home and one number at 170. He reports these numbers are not at rest and he will now monitor at rest numbers.       Relevant Orders   CBC   Comprehensive metabolic panel   TSH   Hyperlipidemia    Encouraged heart healthy diet, increase exercise, avoid trans fats, consider a krill oil cap daily      Relevant Orders   Lipid panel   Vitamin D deficiency    Supplement and monitor      Well controlled  type 2 diabetes mellitus with peripheral neuropathy ([place])    hgba1c acceptable, minimize simple carbs. Increase exercise as tolerated. Continue current meds      Relevant Orders   Hemoglobin A1c   Sinusitis    Has been struggling with increased congestion and PND for past month and it has not improved. Will start Amoxicillin and continue mucinex. Consider Zyrtec and Pepcid if globus and PND persist.      Relevant Medications   amoxicillin (AMOXIL) 500 MG capsule   cetirizine (ZYRTEC) 10 MG tablet  I am having Lanier Ensign. Miner Jr. start on amoxicillin, cetirizine, and famotidine. I am also having him maintain his aspirin EC, multivitamin with minerals, acetaminophen, Polyethylene Glycol 3350 (MIRALAX PO), losartan, metoprolol tartrate, Accu-Chek FastClix Lancets, glucose blood, simvastatin, Insulin Syringe-Needle U-100, Cholecalciferol (VITAMIN D PO), glucagon, insulin NPH Human, insulin regular, prasugrel, and nitroGLYCERIN.  Meds ordered this encounter  Medications  . amoxicillin (AMOXIL) 500 MG capsule    Sig: Take 1 capsule (500 mg total) by mouth 3 (three) times daily.    Dispense:  30 capsule    Refill:  0  . cetirizine (ZYRTEC) 10 MG tablet    Sig: Take 1 tablet (10 mg total) by mouth every morning.    Dispense:  30 tablet    Refill:  3  . famotidine (PEPCID) 20 MG tablet    Sig: Take 1 tablet (20 mg total) by mouth at bedtime.    Dispense:  30 tablet    Refill:  3     Penni Homans, MD

## 2019-12-10 ENCOUNTER — Encounter: Payer: Self-pay | Admitting: Family Medicine

## 2019-12-17 ENCOUNTER — Encounter: Payer: Self-pay | Admitting: Family Medicine

## 2019-12-26 ENCOUNTER — Encounter: Payer: Self-pay | Admitting: Family Medicine

## 2019-12-26 MED FILL — FAMOTIDINE 20 MG TABS: 20 | 30 days supply | Qty: 30 | Fill #1

## 2020-01-01 ENCOUNTER — Other Ambulatory Visit: Payer: Self-pay

## 2020-01-01 ENCOUNTER — Ambulatory Visit: Payer: Medicare HMO | Admitting: Podiatry

## 2020-01-01 DIAGNOSIS — E1149 Type 2 diabetes mellitus with other diabetic neurological complication: Secondary | ICD-10-CM | POA: Diagnosis not present

## 2020-01-01 DIAGNOSIS — M79676 Pain in unspecified toe(s): Secondary | ICD-10-CM

## 2020-01-01 DIAGNOSIS — B351 Tinea unguium: Secondary | ICD-10-CM

## 2020-01-02 NOTE — Progress Notes (Signed)
Subjective: 75 y.o. returns the office today for painful, elongated, thickened toenails which he cannot trim himself. Denies any redness or drainage around the nails.  No open lesions he reports.  He has no new concerns. Denies any systemic complaints such as fevers, chills, nausea, vomiting.   PCP: Mosie Lukes, MD  Endocrinologist: Dr. Benjiman Core, MD   Last A1c was 7.8 on 11/21/2019  Objective: AAO 3, NAD DP/PT pulses palpable, CRT less than 3 seconds Sensation is mildly decreased with SWM but overall no change.  Neuropathy symptoms are unchanged. Nails hypertrophic, dystrophic, elongated, brittle, discolored 10. There is tenderness overlying the nails 1-5 bilaterally. There is no surrounding erythema or drainage along the nail sites. No pain with calf compression, swelling, warmth, erythema.  Assessment: Patient presents with symptomatic onychomycosis  Plan: -Treatment options including alternatives, risks, complications were discussed -Nails sharply debrided 10 without complication/bleeding. -Continue to monitor neuropathy-since the symptoms are stable. -Discussed daily foot inspection. If there are any changes, to call the office immediately.  -Follow-up in 3 months or sooner if any problems are to arise. In the meantime, encouraged to call the office with any questions, concerns, changes symptoms.  Celesta Gentile, DPM

## 2020-01-12 ENCOUNTER — Other Ambulatory Visit: Payer: Self-pay

## 2020-01-12 ENCOUNTER — Ambulatory Visit: Payer: Medicare HMO | Attending: Internal Medicine

## 2020-01-12 DIAGNOSIS — Z23 Encounter for immunization: Secondary | ICD-10-CM

## 2020-01-12 NOTE — Progress Notes (Signed)
° °  [id number]  Name:  Michael Cunningham.    MRN: [id number] DOB: 1945/09/08  01/12/2020  Michael Cunningham was observed post Covid-19 immunization for 15 minutes without incident. He was provided with Vaccine Information Sheet and instruction to access the V-Safe system.   Michael Cunningham was instructed to call 911 with any severe reactions post vaccine:  Difficulty breathing   Swelling of face and throat   A fast heartbeat   A bad rash all over body   Dizziness and weakness

## 2020-01-22 MED FILL — FAMOTIDINE 20 MG TABS: 20 | 30 days supply | Qty: 30 | Fill #2

## 2020-01-23 DIAGNOSIS — G4733 Obstructive sleep apnea (adult) (pediatric): Secondary | ICD-10-CM | POA: Diagnosis not present

## 2020-02-22 DIAGNOSIS — G4733 Obstructive sleep apnea (adult) (pediatric): Secondary | ICD-10-CM | POA: Diagnosis not present

## 2020-03-03 ENCOUNTER — Other Ambulatory Visit: Payer: Medicare HMO

## 2020-03-05 NOTE — Addendum Note (Signed)
Addended by: Janea Schwenn A on: 03/05/2020 03:50 PM   Modules accepted: Orders  

## 2020-03-06 ENCOUNTER — Telehealth: Payer: Medicare HMO | Admitting: Family Medicine

## 2020-03-10 MED FILL — FAMOTIDINE 20 MG TABS: 20 | 30 days supply | Qty: 30 | Fill #3

## 2020-03-11 ENCOUNTER — Other Ambulatory Visit: Payer: Self-pay

## 2020-03-11 ENCOUNTER — Other Ambulatory Visit (INDEPENDENT_AMBULATORY_CARE_PROVIDER_SITE_OTHER): Payer: Medicare HMO

## 2020-03-11 DIAGNOSIS — E782 Mixed hyperlipidemia: Secondary | ICD-10-CM | POA: Diagnosis not present

## 2020-03-11 DIAGNOSIS — E1142 Type 2 diabetes mellitus with diabetic polyneuropathy: Secondary | ICD-10-CM

## 2020-03-11 DIAGNOSIS — I10 Essential (primary) hypertension: Secondary | ICD-10-CM

## 2020-03-11 LAB — LIPID PANEL
Cholesterol: 137 mg/dL (ref 0–200)
HDL: 32.9 mg/dL — ABNORMAL LOW
LDL Cholesterol: 69 mg/dL (ref 0–99)
NonHDL: 103.81
Total CHOL/HDL Ratio: 4
Triglycerides: 174 mg/dL — ABNORMAL HIGH (ref 0.0–149.0)
VLDL: 34.8 mg/dL (ref 0.0–40.0)

## 2020-03-11 LAB — COMPREHENSIVE METABOLIC PANEL
ALT: 23 U/L (ref 0–53)
AST: 22 U/L (ref 0–37)
Albumin: 4 g/dL (ref 3.5–5.2)
Alkaline Phosphatase: 83 U/L (ref 39–117)
BUN: 16 mg/dL (ref 6–23)
CO2: 26 mEq/L (ref 19–32)
Calcium: 8.7 mg/dL (ref 8.4–10.5)
Chloride: 104 mEq/L (ref 96–112)
Creatinine, Ser: 0.84 mg/dL (ref 0.40–1.50)
GFR: 85.19 mL/min (ref >60.00)
Glucose, Bld: 154 mg/dL — ABNORMAL HIGH (ref 70–99)
Potassium: 4.1 mEq/L (ref 3.5–5.1)
Sodium: 137 mEq/L (ref 135–145)
Total Bilirubin: 0.6 mg/dL (ref 0.2–1.2)
Total Protein: 6.8 g/dL (ref 6.0–8.3)

## 2020-03-11 LAB — CBC
HCT: 42.6 % (ref 39.0–52.0)
Hemoglobin: 14.4 g/dL (ref 13.0–17.0)
MCHC: 33.8 g/dL (ref 30.0–36.0)
MCV: 93.8 fl (ref 78.0–100.0)
Platelets: 166 K/uL (ref 150.0–400.0)
RBC: 4.54 Mil/uL (ref 4.22–5.81)
RDW: 13.5 % (ref 11.5–15.5)
WBC: 5.7 K/uL (ref 4.0–10.5)

## 2020-03-11 LAB — HEMOGLOBIN A1C: Hgb A1c MFr Bld: 7.6 % — ABNORMAL HIGH (ref 4.6–6.5)

## 2020-03-11 LAB — TSH: TSH: 2.53 u[IU]/mL (ref 0.35–4.50)

## 2020-03-17 ENCOUNTER — Encounter: Payer: Self-pay | Admitting: Family Medicine

## 2020-03-17 ENCOUNTER — Telehealth (INDEPENDENT_AMBULATORY_CARE_PROVIDER_SITE_OTHER): Payer: Medicare HMO | Admitting: Family Medicine

## 2020-03-17 ENCOUNTER — Other Ambulatory Visit: Payer: Self-pay

## 2020-03-17 ENCOUNTER — Other Ambulatory Visit: Payer: Self-pay | Admitting: Family Medicine

## 2020-03-17 DIAGNOSIS — E782 Mixed hyperlipidemia: Secondary | ICD-10-CM

## 2020-03-17 DIAGNOSIS — E559 Vitamin D deficiency, unspecified: Secondary | ICD-10-CM | POA: Diagnosis not present

## 2020-03-17 DIAGNOSIS — E1142 Type 2 diabetes mellitus with diabetic polyneuropathy: Secondary | ICD-10-CM

## 2020-03-17 DIAGNOSIS — D689 Coagulation defect, unspecified: Secondary | ICD-10-CM

## 2020-03-17 DIAGNOSIS — I10 Essential (primary) hypertension: Secondary | ICD-10-CM | POA: Diagnosis not present

## 2020-03-17 DIAGNOSIS — D696 Thrombocytopenia, unspecified: Secondary | ICD-10-CM | POA: Diagnosis not present

## 2020-03-17 DIAGNOSIS — R0981 Nasal congestion: Secondary | ICD-10-CM | POA: Diagnosis not present

## 2020-03-17 MED ORDER — CETIRIZINE HCL 10 MG PO TABS: 10.0000 mg | ORAL_TABLET | Freq: Every morning | ORAL | 3 refills | Status: DC

## 2020-03-17 MED ORDER — FLUTICASONE PROPIONATE 50 MCG/ACT NA SUSP: 2.0000 | Freq: Every day | NASAL | 3 refills | Status: DC

## 2020-03-17 MED ORDER — LOSARTAN POTASSIUM 100 MG PO TABS: 100.0000 mg | ORAL_TABLET | Freq: Every day | ORAL | 0 refills | Status: AC

## 2020-03-17 MED FILL — FLUTICASONE PROP 50 MCG SPR: 50 | 30 days supply | Qty: 16 | Fill #0

## 2020-03-17 MED FILL — CETIRIZINE HCL 10 MG TABS: 10 | 100 days supply | Qty: 100 | Fill #0

## 2020-03-17 NOTE — Assessment & Plan Note (Signed)
hgba1c acceptable, minimize simple carbs. Increase exercise as tolerated. Continue current meds but decrease dinner regular insulin by units because he is having intermittent episodes of low symptomatic sugars in the evenings dropping to 40s. Also add an evening snack with protein

## 2020-03-17 NOTE — Assessment & Plan Note (Signed)
Improved on recent labs, he does note some easy bruising and bleedingmore when he cuts himself but no blood in stool or bleeding gums. If his symptoms worsen may have to stop aspirin

## 2020-03-17 NOTE — Assessment & Plan Note (Signed)
Encouraged heart healthy diet, increase exercise, avoid trans fats, consider a krill oil cap daily. Tolerating Simvastatin.  

## 2020-03-17 NOTE — Assessment & Plan Note (Signed)
Supplement and monitor 

## 2020-03-17 NOTE — Assessment & Plan Note (Signed)
With some PND. Will try using Flonase and Zyrtec daily and nasal saline prn and report if symptoms worsen

## 2020-03-17 NOTE — Progress Notes (Signed)
Virtual Visit via Video Note  I connected with Michael Carnes. on 03/17/20 at  2:40 PM EST by a video enabled telemedicine application and verified that I am speaking with the correct person using two identifiers.  Location: Patient: home, patient, wife and provider were in this visit Provider: office   I discussed the limitations of evaluation and management by telemedicine and the availability of in person appointments. The patient expressed understanding and agreed to proceed. Mervin Kung, CMA was able to get the patient set up on a video visit.      Subjective:    Patient ID: Michael Carnes., male    DOB: 05-29-1945, 75 y.o.   MRN: [id number]  Chief Complaint  Patient presents with  . Follow-up    Pt 3 mo follow up     HPI Patient is in today for follow up on chronic medical concerns. He denies any recent febrile illness or hospitalization.s no polyuria or polydipsia. He is trying to maintain a heart healthy diet and to stay as active as possible. He is maintaining quarantine with his wife. Denies CP/palp/SOB/HA/congestion/fevers/GI or GU c/o. Taking meds as prescribed  Past Medical History:  Diagnosis Date  . CAD (coronary artery disease)    a. 05/2014 [place] s/p DES x4 to mid-distLAD and DESx1 to DI  . Complication of anesthesia    11'12 Surgery with excrutiating "head about to explode" upon awakening  -lasted several hours postop  ( SURGERY DONE 08-13-2011 AT [facility] PT DID OK POST OP)  . Diverticulosis of colon    LEFT SIDE  . Exposure to Agent [facility] 10/18/2016   [place]  . History of small bowel obstruction    X2  28 YRS AGO AND LAST ONE 04/2014--  RESOLVED WITHOUT SURGICAL INTERVENTION  . Hyperlipidemia   . Hypertension   . Knee pain, bilateral 08/13/2011  . Left ureteral calculus   . Multiple lipomas    hx. mulitple and some remains -arms, legs  . Obesity, unspecified 09/18/2013  . Obstructive sleep apnea 01/01/2016  . Recurrent kidney stones 07/08/2013   Follows  with Dr Patsi Sears He believes they are Calcium oxalate stones  . Thrombocytopenia (HCC) 07/07/2014  . Type 2 diabetes mellitus (HCC)   . Vitamin D deficiency 07/02/2014    Past Surgical History:  Procedure Laterality Date  . COLONOSCOPY W/ POLYPECTOMY  LAST ONE 2013  . CORONARY STENT PLACEMENT  05/17/2014   PROXIMAL X 4  MID & DISTAL LAD  DIAGONAL   . KNEE ARTHROSCOPY  08/13/2011   right ARTHROSCOPY KNEE;  Surgeon: Loanne Drilling, MD;  Location: [facility]S;  Service: Orthopedics;  Laterality: Right;  medial  meniscal debridement, excision of plica, synovectomy  . KNEE ARTHROSCOPY Left 11/16/2013   Procedure: LEFT KNEE ARTHROSCOPY MEDIAL MENISCAL DEBRIDEMENT, CHONDROPLASTY;  Surgeon: Loanne Drilling, MD;  Location: [facility];  Service: Orthopedics;  Laterality: Left;  . KNEE ARTHROSCOPY W/ MENISCECTOMY Right 01-29-2011   twice, first trimmed mediscus and cyst  . LEFT HEART CATHETERIZATION WITH CORONARY ANGIOGRAM N/A 05/17/2014   Procedure: LEFT HEART CATHETERIZATION WITH CORONARY ANGIOGRAM;  Surgeon: Kathleene Hazel, MD;  Location: [facility] CATH LAB;  Service: Cardiovascular;  Laterality: N/A;  . LIPOMA EXCISION  1975   multiple, 10-12  . PERCUTANEOUS CORONARY STENT INTERVENTION (PCI-S)  05/17/2014   Procedure: PERCUTANEOUS CORONARY STENT INTERVENTION (PCI-S);  Surgeon: Kathleene Hazel, MD;  Location: [facility] CATH LAB;  Service: Cardiovascular;;  Diag1 and Prox to Distal LAD  Family History  Problem Relation Age of Onset  . Hypertension Mother   . Cancer Mother 73       ovarian  . Heart disease Father        CHF  . Heart attack Father 75  . Cancer Sister        breast- just finished her last radiation  . Heart disease Maternal Grandfather        ?  [name] Heart attack Paternal Grandfather 34  . Heart attack Paternal Uncle 2       Sudden death  . Diabetes Neg Hx     Social History   Socioeconomic History  . Marital status: Married    Spouse name: Not on file  . Number of children: 2   . Years of education: Not on file  . Highest education level: Not on file  Occupational History  . Not on file  Tobacco Use  . Smoking status: Former Smoker    Packs/day: 1.00    Years: 4.00    Pack years: 4.00    Types: Cigarettes    Quit date: 06/14/1967    Years since quitting: 52.7  . Smokeless tobacco: Never Used  Substance and Sexual Activity  . Alcohol use: No    Alcohol/week: 0.0 standard drinks  . Drug use: No  . Sexual activity: Yes    Comment: lives with wife  Other Topics Concern  . 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     Social Determinants of Health   Financial Resource Strain: Low Risk   . Difficulty of Paying Living Expenses: Not hard at all  Food Insecurity: No Food Insecurity  . Worried About Charity fundraiser in the Last Year: Never true  . Ran Out of Food in the Last Year: Never true  Transportation Needs: No Transportation Needs  . Lack of Transportation (Medical): No  . Lack of Transportation (Non-Medical): No  Physical Activity: Not on file  Stress: Not on file  Social Connections: Not on file  Intimate Partner Violence: Not on file    Outpatient Medications Prior to Visit  Medication Sig Dispense Refill  . ACCU-CHEK FASTCLIX LANCETS MISC Use to test blood sugar 2 times daily. Dx: E11.40 204 each 3  . acetaminophen (TYLENOL) 500 MG tablet Take 1,000 mg by mouth every 6 (six) hours as needed for mild pain. Reported on 03/31/2015    . aspirin EC 81 MG tablet Take 81 mg by mouth daily with breakfast.     . Cholecalciferol (VITAMIN D PO) Take 5,000 Units by mouth daily.    [name] glucagon (GLUCAGON EMERGENCY) 1 MG injection Inject 1 mg into the muscle once as needed for up to 1 dose. 1 each 12  . insulin NPH Human (HUMULIN N) 100 UNIT/ML injection INJECT UNDER SKIN 20 UNITS IN THE AM AND 44 UNITS AT BEDTIME. (Patient taking differently: INJECT UNDER SKIN 20 UNITS IN THE AM AND 47 UNITS AT BEDTIME.) 30  mL 3  . insulin regular (HUMULIN R) 100 units/mL injection Inject 0.2-0.35 mLs (20-35 Units total) into the skin 3 (three) times daily before meals. 30 mL 3  . Insulin Syringe-Needle U-100 (TRUEPLUS INSULIN SYRINGE) 30G X 5/16" 0.5 ML MISC USE TO INJECT INSULIN 5 TIMES DAILY. 200 each 3  . metoprolol tartrate (LOPRESSOR) 25 MG tablet Take 1 tablet (25 mg total) by mouth 2 (two) times daily. 180 tablet 3  . Multiple Vitamin (  MULTIVITAMIN WITH MINERALS) TABS tablet Take 1 tablet by mouth daily.    . nitroGLYCERIN (NITROSTAT) 0.4 MG SL tablet Place 1 tablet (0.4 mg total) under the tongue every 5 (five) minutes as needed for chest pain. 25 tablet 3  . Polyethylene Glycol 3350 (MIRALAX PO) Take by mouth as needed. Reported on 03/31/2015    . prasugrel (EFFIENT) 10 MG TABS tablet Take 1 tablet (10 mg total) by mouth daily. 90 tablet 3  . simvastatin (ZOCOR) 40 MG tablet TAKE 1 TABLET (40 MG) BY MOUTH AT BEDTIME. 90 tablet 2  . losartan (COZAAR) 50 MG tablet TAKE 1&1/2 (75 MG TOTAL) TABLETS BY MOUTH EVERY MORNING (Patient taking differently: Take 25 mg by mouth daily. TAKE 3- 25 MG TABLETS BY MOUTH EVERY MORNING) 180 tablet 3  . amoxicillin (AMOXIL) 500 MG capsule Take 1 capsule (500 mg total) by mouth 3 (three) times daily. (Patient not taking: Reported on 03/17/2020) 30 capsule 0  . cetirizine (ZYRTEC) 10 MG tablet Take 1 tablet (10 mg total) by mouth every morning. (Patient not taking: Reported on 03/17/2020) 30 tablet 3  . famotidine (PEPCID) 20 MG tablet Take 1 tablet (20 mg total) by mouth at bedtime. (Patient not taking: Reported on 03/17/2020) 30 tablet 3  . glucose blood (ACCU-CHEK AVIVA PLUS) test strip Use to test blood sugar 2 times daily. Dx: E11.40 (Patient not taking: Reported on 03/17/2020) 200 each 3   No facility-administered medications prior to visit.    Allergies  Allergen Reactions  . Ciprofloxacin Other (See Comments)    Abdominal pain  . Lipitor [Atorvastatin Calcium] Other (See  Comments)    Severe muscle pain  . Lisinopril Cough  . Metformin And Related Diarrhea    Review of Systems  Constitutional: Positive for malaise/fatigue. Negative for fever.  HENT: Positive for congestion.   Eyes: Negative for blurred vision.  Respiratory: Positive for sputum production. Negative for shortness of breath.   Cardiovascular: Negative for chest pain, palpitations and leg swelling.  Gastrointestinal: Negative for abdominal pain, blood in stool and nausea.  Genitourinary: Negative for dysuria and frequency.  Musculoskeletal: Negative for falls.  Skin: Negative for rash.  Neurological: Negative for dizziness, loss of consciousness and headaches.  Endo/Heme/Allergies: Negative for environmental allergies.  Psychiatric/Behavioral: Negative for depression. The patient is not nervous/anxious.        Objective:    Physical Exam Constitutional:      Appearance: Normal appearance. He is not ill-appearing.  HENT:     Head: Normocephalic and atraumatic.     Right Ear: External ear normal.     Left Ear: External ear normal.     Nose: Nose normal.  Eyes:     General:        Right eye: No discharge.        Left eye: No discharge.  Pulmonary:     Effort: Pulmonary effort is normal.  Neurological:     Mental Status: He is alert and oriented to person, place, and time.  Psychiatric:        Behavior: Behavior normal.     There were no vitals taken for this visit. Wt Readings from Last 3 Encounters:  11/27/19 263 lb (119.3 kg)  08/28/19 264 lb (119.7 kg)  08/16/19 257 lb 6.4 oz (116.8 kg)    Diabetic Foot Exam - Simple   No data filed    Lab Results  Component Value Date   WBC 5.7 03/11/2020   HGB 14.4 03/11/2020  HCT 42.6 03/11/2020   PLT 166.0 03/11/2020   GLUCOSE 154 (H) 03/11/2020   CHOL 137 03/11/2020   TRIG 174.0 (H) 03/11/2020   HDL 32.90 (L) 03/11/2020   LDLDIRECT 108.0 04/16/2014   LDLCALC 69 03/11/2020   ALT 23 03/11/2020   AST 22 03/11/2020    NA 137 03/11/2020   K 4.1 03/11/2020   CL 104 03/11/2020   CREATININE 0.84 03/11/2020   BUN 16 03/11/2020   CO2 26 03/11/2020   TSH 2.53 03/11/2020   PSA 1.92 09/25/2018   INR 1.02 05/17/2014   HGBA1C 7.6 (H) 03/11/2020   MICROALBUR 1.7 06/24/2015    Lab Results  Component Value Date   TSH 2.53 03/11/2020   Lab Results  Component Value Date   WBC 5.7 03/11/2020   HGB 14.4 03/11/2020   HCT 42.6 03/11/2020   MCV 93.8 03/11/2020   PLT 166.0 03/11/2020   Lab Results  Component Value Date   NA 137 03/11/2020   K 4.1 03/11/2020   CO2 26 03/11/2020   GLUCOSE 154 (H) 03/11/2020   BUN 16 03/11/2020   CREATININE 0.84 03/11/2020   BILITOT 0.6 03/11/2020   ALKPHOS 83 03/11/2020   AST 22 03/11/2020   ALT 23 03/11/2020   PROT 6.8 03/11/2020   ALBUMIN 4.0 03/11/2020   CALCIUM 8.7 03/11/2020   ANIONGAP 8 05/18/2014   GFR 85.19 03/11/2020   Lab Results  Component Value Date   CHOL 137 03/11/2020   Lab Results  Component Value Date   HDL 32.90 (L) 03/11/2020   Lab Results  Component Value Date   LDLCALC 69 03/11/2020   Lab Results  Component Value Date   TRIG 174.0 (H) 03/11/2020   Lab Results  Component Value Date   CHOLHDL 4 03/11/2020   Lab Results  Component Value Date   HGBA1C 7.6 (H) 03/11/2020       Assessment & Plan:   Problem List Items Addressed This Visit    Hypertension - Primary (Chronic)    Mildly elevated. Increase Losartan to 100 mg qd and they will monitor his blood pressure closely.. Encouraged heart healthy diet such as the DASH diet and exercise as tolerated.       Relevant Medications   losartan (COZAAR) 100 MG tablet   Other Relevant Orders   CBC   TSH   Hyperlipidemia    Encouraged heart healthy diet, increase exercise, avoid trans fats, consider a krill oil cap daily. Tolerating Simvastatin       Relevant Medications   losartan (COZAAR) 100 MG tablet   Other Relevant Orders   Comprehensive metabolic panel   Lipid panel    Vitamin D deficiency    Supplement and monitor      Relevant Orders   VITAMIN D 25 Hydroxy (Vit-D Deficiency, Fractures)   Thrombocytopenia (HCC)    Improved on recent labs, he does note some easy bruising and bleedingmore when he cuts himself but no blood in stool or bleeding gums. If his symptoms worsen may have to stop aspirin      Well controlled type 2 diabetes mellitus with peripheral neuropathy (HCC)    hgba1c acceptable, minimize simple carbs. Increase exercise as tolerated. Continue current meds but decrease dinner regular insulin by units because he is having intermittent episodes of low symptomatic sugars in the evenings dropping to 40s. Also add an evening snack with protein      Relevant Medications   losartan (COZAAR) 100 MG tablet   Other Relevant  Orders   Hemoglobin A1c   Coagulation defect (HCC)   Head congestion    With some PND. Will try using Flonase and Zyrtec daily and nasal saline prn and report if symptoms worsen         I have discontinued Zyrus M. Gaylord Jr.'s losartan, glucose blood, amoxicillin, and famotidine. I am also having him start on losartan and fluticasone. Additionally, I am having him maintain his aspirin EC, multivitamin with minerals, acetaminophen, Polyethylene Glycol 3350 (MIRALAX PO), metoprolol tartrate, Accu-Chek FastClix Lancets, simvastatin, Insulin Syringe-Needle U-100, Cholecalciferol (VITAMIN D PO), glucagon, insulin NPH Human, insulin regular, prasugrel, nitroGLYCERIN, and cetirizine.  Meds ordered this encounter  Medications  . losartan (COZAAR) 100 MG tablet    Sig: Take 1 tablet (100 mg total) by mouth daily.    Dispense:  90 tablet    Refill:  0  . fluticasone (FLONASE) 50 MCG/ACT nasal spray    Sig: Place 2 sprays into both nostrils daily.    Dispense:  16 g    Refill:  3  . cetirizine (ZYRTEC) 10 MG tablet    Sig: Take 1 tablet (10 mg total) by mouth every morning.    Dispense:  30 tablet    Refill:  3  I discussed  the assessment and treatment plan with the patient. The patient was progvided an opportunity to ask questions and all were answered. The patient agreed with the plan and demonstrated an understanding of the instructions.   The patient was advised to call back or seek an in-person evaluation if the symptoms worsen or if the condition fails to improve as anticipated.  I provided 23 minutes of non-face-to-face time during this encounter.   Penni Homans, MD

## 2020-03-17 NOTE — Assessment & Plan Note (Addendum)
Mildly elevated. Increase Losartan to 100 mg qd and they will monitor his blood pressure closely.. Encouraged heart healthy diet such as the DASH diet and exercise as tolerated.

## 2020-03-24 DIAGNOSIS — G4733 Obstructive sleep apnea (adult) (pediatric): Secondary | ICD-10-CM | POA: Diagnosis not present

## 2020-04-03 ENCOUNTER — Other Ambulatory Visit: Payer: Self-pay

## 2020-04-03 ENCOUNTER — Ambulatory Visit (INDEPENDENT_AMBULATORY_CARE_PROVIDER_SITE_OTHER): Payer: Medicare HMO | Admitting: Podiatry

## 2020-04-03 ENCOUNTER — Ambulatory Visit: Payer: Medicare HMO | Admitting: Family Medicine

## 2020-04-03 DIAGNOSIS — M79676 Pain in unspecified toe(s): Secondary | ICD-10-CM | POA: Diagnosis not present

## 2020-04-03 DIAGNOSIS — B351 Tinea unguium: Secondary | ICD-10-CM | POA: Diagnosis not present

## 2020-04-03 DIAGNOSIS — E1149 Type 2 diabetes mellitus with other diabetic neurological complication: Secondary | ICD-10-CM

## 2020-04-03 DIAGNOSIS — D689 Coagulation defect, unspecified: Secondary | ICD-10-CM | POA: Diagnosis not present

## 2020-04-09 NOTE — Progress Notes (Signed)
Subjective: 75 y.o. returns the office today for painful, elongated, thickened toenails which he cannot trim himself. Denies any redness or drainage around the nails.  No open lesions he reports. Neuropathy is stable and no any worsening. He has no new concerns. Denies any systemic complaints such as fevers, chills, nausea, vomiting.   PCP: Mosie Lukes, MD  Endocrinologist: Dr. Benjiman Core, MD   Last A1c was 7.6   Objective: AAO 3, NAD DP/PT pulses palpable, CRT less than 3 seconds Sensation is mildly decreased with SWM but overall no change.  Neuropathy symptoms are unchanged. Nails hypertrophic, dystrophic, elongated, brittle, discolored 10. There is tenderness overlying the nails 1-5 bilaterally. There is no surrounding erythema or drainage along the nail sites. No pain with calf compression, swelling, warmth, erythema.  Assessment: Patient presents with symptomatic onychomycosis  Plan: -Treatment options including alternatives, risks, complications were discussed -Nails sharply debrided 10 without complication/bleeding. -Continue to monitor neuropathy-since the symptoms are stable. -Discussed daily foot inspection. If there are any changes, to call the office immediately.  -Follow-up in 3 months or sooner if any problems are to arise. In the meantime, encouraged to call the office with any questions, concerns, changes symptoms.  Celesta Gentile, DPM

## 2020-05-22 ENCOUNTER — Encounter: Payer: Self-pay | Admitting: Internal Medicine

## 2020-05-22 ENCOUNTER — Ambulatory Visit: Payer: Medicare HMO | Admitting: Internal Medicine

## 2020-05-22 ENCOUNTER — Other Ambulatory Visit: Payer: Self-pay

## 2020-05-22 VITALS — BP 122/78 | HR 52 | Ht 71.0 in | Wt 261.8 lb

## 2020-05-22 DIAGNOSIS — E669 Obesity, unspecified: Secondary | ICD-10-CM | POA: Diagnosis not present

## 2020-05-22 DIAGNOSIS — E1142 Type 2 diabetes mellitus with diabetic polyneuropathy: Secondary | ICD-10-CM

## 2020-05-22 DIAGNOSIS — E782 Mixed hyperlipidemia: Secondary | ICD-10-CM

## 2020-05-22 LAB — POCT GLYCOSYLATED HEMOGLOBIN (HGB A1C): Hemoglobin A1C: 6.8 % — AB (ref 4.0–5.6)

## 2020-05-22 LAB — POCT GLUCOSE (DEVICE FOR HOME USE): Glucose Fasting, POC: 338 mg/dL — AB (ref 70–99)

## 2020-05-22 MED ORDER — INSULIN REGULAR HUMAN 100 UNIT/ML IJ SOLN
16.0000 [IU] | Freq: Three times a day (TID) | INTRAMUSCULAR | 3 refills | Status: DC
Start: 2020-05-22 — End: 2021-01-08

## 2020-05-22 MED ORDER — INSULIN NPH (HUMAN) (ISOPHANE) 100 UNIT/ML ~~LOC~~ SUSP: SUBCUTANEOUS | 3 refills | Status: DC

## 2020-05-22 NOTE — Patient Instructions (Addendum)
  Please continue: Insulin Before breakfast Before lunch Before dinner Bedtime  Regular  30 >> 20-24  25-30 >> 20-24  20-28 >> 16-20   NPH  20    47 >> 40   Please do not use more than 15 units of R insulin before a meal prior to exercise (rehab, working in the yard, etc.).  Please take the R insulin 30 min before a meal, unless your sugars are <90, in which case you need to take it at the start of the meal.  Try to obtain a CGM  - I would suggest the Freestyle Libre 2 CGM.  The most common suppliers for the continuous glucose monitor ([employer] [place]).   [facility] [phone], extension [phone]  -[facility] [phone]  [employer] [facility] [place] [phone]    Please return in 3-4 months.

## 2020-05-22 NOTE — Progress Notes (Signed)
Patient ID: Michael Cunningham., male   DOB: May 12, 1945, 75 y.o.   MRN: [id number]  This visit occurred during the SARS-CoV-2 public health emergency.  Safety protocols were in place, including screening questions prior to the visit, additional usage of staff PPE, and extensive cleaning of exam room while observing appropriate contact time as indicated for disinfecting solutions.   HPI: Michael Cunningham. is a 75 y.o.-year-old male, presenting for f/u DM2, dx in 1999, insulin-dependent since ~2008, controlled, with complications (CAD, peripheral neuropathy, DR). Last visit 1 year and 6 months (virtual). He is 60% disabled >> highest priority group within his insurance.  He is here with his wife offers part of the history, especially regarding his medications, insurance, and past medical history.  Reviewed HbA1c levels: 04/08/2020: HbA1c 7.4%-results received after the visit Lab Results  Component Value Date   HGBA1C 7.6 (H) 03/11/2020   HGBA1C 7.8 (H) 11/21/2019   HGBA1C 7.6 (H) 08/09/2019  05/2016: HbA1c calculated from the fructosamine is better, at 5.9%. 10/01/2015: HbA1c calculated from fructosamine is still great, at 6.6%. 06/24/2015: HbA1c calculated from fructosamine is much lower, at 6.4%.  Pt is on a regimen of: Insulin Before breakfast Before lunch Before dinner Bedtime  Regular  15 >> 30  20-25 >> 25-30  25-30 >> 20-28   NPH  20    44 >> 47   Please do not use more than  15 units of R insulin before a meal prior to exercise (rehab, working in the yard, etc.). He cannot afford analog insulin. He tried Metformin R and XR >> diarrhea He tried Januvia.  Pt checks his sugars 2-3 times a day: - am: 88-152, 168  >> 114-135 >> 111, 120-130, 172 >> 107-188, usu. 130-160 - 2h after b'fast:  71-190, 246 >> 69, 141-177, 212 >> n/c >> 201 - before lunch:  75-120, 200 >> 57, 105-120 >> 61-139, 155, 177 - 2h after lunch:  70 >> n/c  >> 55, 75 >> n/c >> 74, 196 - before dinner:  90-130  >> 64, 82, 100-112 >> 44, 56-131, 166, 226 - 2h after dinner: 111-132, 254 >> 117-132 >> n/c  - bedtime: 69-196 >> 66-152, 189 >> 114-180 >> n/c >> 104-198, 238 - nighttime:90 >> n/c >> 47, 59 Lowest sugar was 35 x1 >> 55 >> 71 >> 57 (delayed lunch) >> 44; he has hypoglycemia awareness in the 90s. Highest sugar was 254 >> 214 >> 172 >>> 238.  Glucometer: AccuChek [employer] >> Precision Extra by [employer] (given by [facility]).  Pt's meals are: - Breakfast: cheerios + skim milk + banana  - Lunch: sandwich or leftover  - Dinner: meat + veggies + starch - Snacks: PB crackers; wheat thins, apple or grapes, pear   -No CKD, last BUN/creatinine:  04/08/2020: 17/0.92, glucose 224, ACR 7.9 Lab Results  Component Value Date   BUN 16 03/11/2020   CREATININE 0.84 03/11/2020  On losartan. -+ HL; last set of lipids: Lab Results  Component Value Date   CHOL 137 03/11/2020   HDL 32.90 (L) 03/11/2020   LDLCALC 69 03/11/2020   LDLDIRECT 108.0 04/16/2014   TRIG 174.0 (H) 03/11/2020   CHOLHDL 4 03/11/2020   On simvastatin 40. - last eye exam was in 10/2019: No DR, previously mild DR.  He also has angle-closure glaucoma and had surgery in both eyes.  + Cataracts. Dr. Ellie Lunch. - no numbness and tingling in his  feet - [facility] (Dr Amalia Hailey).  He  has mild peripheral neuropathy in his right foot.  He has toenail dermatophytosis.  He was dx'ed with Diabetic hand sd. (cheiroarthropathy) >> he had steroid injections.  He also has a history of HTN.  He has a history of vitamin D deficiency, but latest levels have been normal: Lab Results  Component Value Date   VD25OH 38 11/21/2019   VD25OH 38.06 08/09/2019   VD25OH 40.49 12/28/2018   VD25OH 42.36 02/27/2018   VD25OH 41.27 08/19/2017   VD25OH 38.41 02/09/2017   VD25OH 45.74 12/25/2015   VD25OH 43.41 06/24/2015   VD25OH 29.93 (L) 03/26/2015   VD25OH 22.93 (L) 12/19/2014   ROS: Constitutional: no weight gain/no weight loss, no fatigue, no  subjective hyperthermia, no subjective hypothermia Eyes: no blurry vision, no xerophthalmia ENT: no sore throat, no nodules palpated in neck, no dysphagia, no odynophagia, no hoarseness Cardiovascular: no CP/no SOB/no palpitations/no leg swelling Respiratory: no cough/no SOB/no wheezing Gastrointestinal: no N/no V/no D/no C/no acid reflux Musculoskeletal: no muscle aches/no joint aches Skin: no rashes, no hair loss Neurological: no tremors/no numbness/no tingling/no dizziness  I reviewed pt's medications, allergies, PMH, social hx, family hx, and changes were documented in the history of present illness. Otherwise, unchanged from my initial visit note.  Past Medical History:  Diagnosis Date  . CAD (coronary artery disease)    a. 05/2014 [place] s/p DES x4 to mid-distLAD and DESx1 to DI  . Complication of anesthesia    11'12 Surgery with excrutiating "head about to explode" upon awakening  -lasted several hours postop  ( SURGERY DONE 08-13-2011 AT [facility] PT DID OK POST OP)  . Diverticulosis of colon    LEFT SIDE  . Exposure to Agent [facility] 10/18/2016   [place]  . History of small bowel obstruction    X2  28 YRS AGO AND LAST ONE 04/2014--  RESOLVED WITHOUT SURGICAL INTERVENTION  . Hyperlipidemia   . Hypertension   . Knee pain, bilateral 08/13/2011  . Left ureteral calculus   . Multiple lipomas    hx. mulitple and some remains -arms, legs  . Obesity, unspecified 09/18/2013  . Obstructive sleep apnea 01/01/2016  . Recurrent kidney stones 07/08/2013   Follows with Dr Gaynelle Arabian He believes they are Calcium oxalate stones  . Thrombocytopenia ([place]) 07/07/2014  . Type 2 diabetes mellitus ([place])   . Vitamin D deficiency 07/02/2014   Past Surgical History:  Procedure Laterality Date  . COLONOSCOPY W/ POLYPECTOMY  LAST ONE 2013  . CORONARY STENT PLACEMENT  05/17/2014   PROXIMAL X 4  MID & DISTAL LAD  DIAGONAL   . KNEE ARTHROSCOPY  08/13/2011   right ARTHROSCOPY KNEE;  Surgeon: Gearlean Alf, MD;   Location: [facility];  Service: Orthopedics;  Laterality: Right;  medial  meniscal debridement, excision of plica, synovectomy  . KNEE ARTHROSCOPY Left 11/16/2013   Procedure: LEFT KNEE ARTHROSCOPY MEDIAL MENISCAL DEBRIDEMENT, CHONDROPLASTY;  Surgeon: Gearlean Alf, MD;  Location: [facility];  Service: Orthopedics;  Laterality: Left;  . KNEE ARTHROSCOPY W/ MENISCECTOMY Right 01-29-2011   twice, first trimmed mediscus and cyst  . LEFT HEART CATHETERIZATION WITH CORONARY ANGIOGRAM N/A 05/17/2014   Procedure: LEFT HEART CATHETERIZATION WITH CORONARY ANGIOGRAM;  Surgeon: Burnell Blanks, MD;  Location: [facility] C[facility];  Service: Cardiovascular;  Laterality: N/A;  . [place]   multiple, 10-12  . PERCUTANEOUS CORONARY STENT INTERVENTION (PCI-S)  05/17/2014   Procedure: PERCUTANEOUS CORONARY STENT INTERVENTION (PCI-S);  Surgeon: Burnell Blanks, MD;  Location: [facility] C[facility];  Service: Cardiovascular;;  Diag1 and Prox to Distal LAD   History   Social History  . Marital Status: Married    Spouse Name: N/A  . Number of Children: 2   Social History Main Topics  . Smoking status: Former Smoker -- 1.00 packs/day for 4 years    Types: Cigarettes    Quit date: 06/14/1967  . Smokeless tobacco: Never Used  . Alcohol Use: No  . Drug Use: No  . 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     Current Outpatient Medications on File Prior to Visit  Medication Sig Dispense Refill  . ACCU-CHEK FASTCLIX LANCETS MISC Use to test blood sugar 2 times daily. Dx: E11.40 204 each 3  . acetaminophen (TYLENOL) 500 MG tablet Take 1,000 mg by mouth every 6 (six) hours as needed for mild pain. Reported on 03/31/2015    . aspirin EC 81 MG tablet Take 81 mg by mouth daily with breakfast.     . cetirizine (ZYRTEC) 10 MG tablet Take 1 tablet (10 mg total) by mouth every morning. 30 tablet 3  . Cholecalciferol (VITAMIN D PO)  Take 5,000 Units by mouth daily.    . fluticasone (FLONASE) 50 MCG/ACT nasal spray Place 2 sprays into both nostrils daily. 16 g 3  . glucagon (GLUCAGON EMERGENCY) 1 MG injection Inject 1 mg into the muscle once as needed for up to 1 dose. 1 each 12  . insulin NPH Human (HUMULIN N) 100 UNIT/ML injection INJECT UNDER SKIN 20 UNITS IN THE AM AND 44 UNITS AT BEDTIME. (Patient taking differently: INJECT UNDER SKIN 20 UNITS IN THE AM AND 47 UNITS AT BEDTIME.) 30 mL 3  . insulin regular (HUMULIN R) 100 units/mL injection Inject 0.2-0.35 mLs (20-35 Units total) into the skin 3 (three) times daily before meals. 30 mL 3  . Insulin Syringe-Needle U-100 (TRUEPLUS INSULIN SYRINGE) 30G X 5/16" 0.5 ML MISC USE TO INJECT INSULIN 5 TIMES DAILY. 200 each 3  . losartan (COZAAR) 100 MG tablet Take 1 tablet (100 mg total) by mouth daily. 90 tablet 0  . metoprolol tartrate (LOPRESSOR) 25 MG tablet Take 1 tablet (25 mg total) by mouth 2 (two) times daily. 180 tablet 3  . Multiple Vitamin (MULTIVITAMIN WITH MINERALS) TABS tablet Take 1 tablet by mouth daily.    . nitroGLYCERIN (NITROSTAT) 0.4 MG SL tablet Place 1 tablet (0.4 mg total) under the tongue every 5 (five) minutes as needed for chest pain. 25 tablet 3  . Polyethylene Glycol 3350 (MIRALAX PO) Take by mouth as needed. Reported on 03/31/2015    . prasugrel (EFFIENT) 10 MG TABS tablet Take 1 tablet (10 mg total) by mouth daily. 90 tablet 3  . simvastatin (ZOCOR) 40 MG tablet TAKE 1 TABLET (40 MG) BY MOUTH AT BEDTIME. 90 tablet 2   No current facility-administered medications on file prior to visit.   Allergies  Allergen Reactions  . Ciprofloxacin Other (See Comments)    Abdominal pain  . Lipitor [Atorvastatin Calcium] Other (See Comments)    Severe muscle pain  . Lisinopril Cough  . Metformin And Related Diarrhea   Family History  Problem Relation Age of Onset  . Hypertension Mother   . Cancer Mother 23       ovarian  . Heart disease Father        CHF   . Heart attack Father 67  .  Cancer Sister        breast- just finished her last radiation  . Heart disease Maternal Grandfather        ?  [name] Heart attack Paternal Grandfather 16  . Heart attack Paternal Uncle 9       Sudden death  . Diabetes Neg Hx    PE:  BP 122/78 (BP Location: Left Arm, Patient Position: Sitting, Cuff Size: Large)   Pulse (!) 52   Ht 5' 11"  (1.803 m)   Wt 261 lb 12.8 oz (118.8 kg)   SpO2 98%   BMI 36.51 kg/m  Body mass index is 36.51 kg/m.  Wt Readings from Last 3 Encounters:  05/22/20 261 lb 12.8 oz (118.8 kg)  11/27/19 263 lb (119.3 kg)  08/28/19 264 lb (119.7 kg)   Constitutional: overweight, in NAD Eyes: PERRLA, EOMI, no exophthalmos ENT: moist mucous membranes, no thyromegaly, no cervical lymphadenopathy Cardiovascular: RRR, No MRG Respiratory: CTA B Gastrointestinal: abdomen soft, NT, ND, BS+ Musculoskeletal: no deformities, strength intact in all 4 Skin: moist, warm, no rashes Neurological: no tremor with outstretched hands, DTR normal in all 4  ASSESSMENT: 1. DM2, insulin-dependent, now better controlled, with complications - CAD, status post stent 05/2014 - Dr Percival Spanish - Peripheral neuropathy - DR  2. HL  3.  Obesity class II  PLAN:  1. Patient with longstanding, previously well-controlled diabetes, on basal-bolus insulin regimen, returning after long absence, of 1.5 years.  Right before last visit, his HbA1c was excellent, at 6.8%, but he had a more recent level of 7.6% 3 months ago.  However, his sugars are usually better than expected from his HbA1c and we have been following him with fructosamine levels in the past. -At this visit, they were concerned about the accuracy of his meter >>  in the office CBG 338, by their meter: 283. -Reviewing his blood sugars at home mom most of the sugars are in target range, and even he has several lows in the 40s to 60s.  Some of these happen at night, as he is using a relatively strict dose of  regular insulin before dinner (previously 28 units, but in the last 2 days 20 units), but some are happening before lunch and dinner.  My suspicion is that he is taking too much regular insulin (the doses that he is taking now are higher than the ones recommended at last visit.  He is also taking a higher dose of NPH at bedtime.  Therefore, I advised him to decrease all his regular insulin doses, especially the 1 for breakfast, but also to decrease his NPH dose at night -Upon questioning, he is not injecting the regular insulin 30 minutes before the meal, but 10 minutes at most, and many times after the meal.  I underlined the importance of injecting 30 minutes before every meal except for the times when he is low before meals, when he should inject right before starting the meal. -I discussed with patient and his wife about the need for a CGM and I suggested the freestyle libre 2 CGM, which has alarms -Given the list of CGM suppliers, but they mentioned that they may need to go to the [place] to obtain this -Upon questioning, they have an unexpired glucagon kit at home refilled by the [place]. Clarified about when the wife needs to use it. - I advised him to: Patient Instructions    Please continue: Insulin Before breakfast Before lunch Before dinner Bedtime  Regular  30 >> 20-24  25-30 >> 20-24  20-28 >> 16-20   NPH  20    47 >> 40   Please do not use more than 15 units of R insulin before a meal prior to exercise (rehab, working in the yard, etc.).  Please take the R insulin 30 min before a meal, unless your sugars are <90, in which case you need to take it at the start of the meal.  Try to obtain a CGM  - I would suggest the Freestyle Libre 2 CGM.  The most common suppliers for the continuous glucose monitor ([employer] [place]).   [facility] [phone], extension [phone]  -[facility] [phone]  [employer] [place] [phone]     Please return in 3-4 months.  - we checked his HbA1c: 6.8% (lower) - advised to check sugars at different times of the day - 3-4x a day, rotating check times - advised for yearly eye exams >> he is UTD - return to clinic in 3-4 months   2. HL -Reviewed latest lipid panel from 02/2020: LDL at goal, triglycerides slightly high, HDL slightly low: Lab Results  Component Value Date   CHOL 137 03/11/2020   HDL 32.90 (L) 03/11/2020   LDLCALC 69 03/11/2020   LDLDIRECT 108.0 04/16/2014   TRIG 174.0 (H) 03/11/2020   CHOLHDL 4 03/11/2020  -Continues Zocor 40 without side effects  3.  Obesity class II -Weight approximately stable in the last 6 months -We will reduce his insulin doses, which should also help with weight loss   Philemon Kingdom, MD PhD [facility] Endocrinology

## 2020-05-29 ENCOUNTER — Encounter: Payer: Self-pay | Admitting: Internal Medicine

## 2020-06-04 ENCOUNTER — Encounter: Payer: Self-pay | Admitting: Internal Medicine

## 2020-06-13 MED FILL — FLUTICASONE PROP 50 MCG SPR: 50 | 30 days supply | Qty: 16 | Fill #1

## 2020-06-19 ENCOUNTER — Other Ambulatory Visit (HOSPITAL_BASED_OUTPATIENT_CLINIC_OR_DEPARTMENT_OTHER): Payer: Self-pay

## 2020-06-19 ENCOUNTER — Encounter: Payer: Self-pay | Admitting: Podiatry

## 2020-06-19 ENCOUNTER — Encounter: Payer: Self-pay | Admitting: Internal Medicine

## 2020-06-19 MED ORDER — CLOTRIMAZOLE-BETAMETHASONE 1-0.05 % EX CREA
1.0000 "application " | TOPICAL_CREAM | Freq: Two times a day (BID) | CUTANEOUS | 0 refills | Status: DC
  Filled 2020-06-19: qty 30, 15d supply, fill #0

## 2020-06-20 NOTE — Telephone Encounter (Signed)
T, if we can print the report, that would be great, to see what we need to do for his low blood sugars.

## 2020-06-23 ENCOUNTER — Other Ambulatory Visit (HOSPITAL_BASED_OUTPATIENT_CLINIC_OR_DEPARTMENT_OTHER): Payer: Self-pay

## 2020-06-23 MED FILL — Cetirizine HCl Tab 10 MG: ORAL | 100 days supply | Qty: 100 | Fill #0 | Status: AC

## 2020-06-24 ENCOUNTER — Encounter: Payer: Self-pay | Admitting: Family Medicine

## 2020-06-24 ENCOUNTER — Other Ambulatory Visit (HOSPITAL_BASED_OUTPATIENT_CLINIC_OR_DEPARTMENT_OTHER): Payer: Self-pay

## 2020-06-24 ENCOUNTER — Telehealth (INDEPENDENT_AMBULATORY_CARE_PROVIDER_SITE_OTHER): Payer: Medicare HMO | Admitting: Family Medicine

## 2020-06-24 ENCOUNTER — Other Ambulatory Visit: Payer: Self-pay | Admitting: Family Medicine

## 2020-06-24 ENCOUNTER — Other Ambulatory Visit: Payer: Self-pay

## 2020-06-24 ENCOUNTER — Other Ambulatory Visit: Payer: Medicare HMO

## 2020-06-24 VITALS — BP 129/63 | HR 58 | Temp 97.8°F

## 2020-06-24 DIAGNOSIS — J069 Acute upper respiratory infection, unspecified: Secondary | ICD-10-CM

## 2020-06-24 MED ORDER — AMOXICILLIN 875 MG PO TABS
875.0000 mg | ORAL_TABLET | Freq: Two times a day (BID) | ORAL | 0 refills | Status: DC
  Filled 2020-06-24: qty 20, 10d supply, fill #0

## 2020-06-24 MED ORDER — AZITHROMYCIN 250 MG PO TABS
ORAL_TABLET | ORAL | 0 refills | Status: DC
  Filled 2020-06-24: qty 6, 5d supply, fill #0

## 2020-06-24 NOTE — Progress Notes (Signed)
MyChart Video Visit    Virtual Visit via Video Note   This visit type was conducted due to national recommendations for restrictions regarding the COVID-19 Pandemic (e.g. social distancing) in an effort to limit this patient's exposure and mitigate transmission in our community. This patient is at least at moderate risk for complications without adequate follow up. This format is felt to be most appropriate for this patient at this time. Physical exam was limited by quality of the video and audio technology used for the visit. tierra creft was able to get the patient set up on a video visit.  Patient location: Home patient and provider in visit as well as the pt wife  Provider location: Office  I discussed the limitations of evaluation and management by telemedicine and the availability of in person appointments. The patient expressed understanding and agreed to proceed.  Visit Date: 06/24/2020  Today's healthcare provider: Ann Held, DO      Subjective:    Patient ID: Michael Gavel., male    DOB: 15-Sep-1945, 75 y.o.   MRN: [id number]  Chief Complaint  Patient presents with  . Sinus Problem    Sinus pressure  . Nasal Congestion    Complains of congestion, using nasal saline and flonase    HPI Patient is in today for a virtual visit.   He c/o pnd since Sunday and mon and Tuesday it was pouring out of his nose.  He c/o head pressure  He is taking flonase and zyrtec and had taken pepcid but stopped     Allegra was tried but did not work and he just started flonase again  Mucus is clear    Past Medical History:  Diagnosis Date  . CAD (coronary artery disease)    a. 05/2014 [place] s/p DES x4 to mid-distLAD and DESx1 to DI  . Complication of anesthesia    11'12 Surgery with excrutiating "head about to explode" upon awakening  -lasted several hours postop  ( SURGERY DONE 08-13-2011 AT [facility] PT DID OK POST OP)  . Diverticulosis of colon    LEFT SIDE  . Exposure to  [facility] 10/18/2016   [place]  . History of small bowel obstruction    X2  28 YRS AGO AND LAST ONE 04/2014--  RESOLVED WITHOUT SURGICAL INTERVENTION  . Hyperlipidemia   . Hypertension   . Knee pain, bilateral 08/13/2011  . Left ureteral calculus   . Multiple lipomas    hx. mulitple and some remains -arms, legs  . Obesity, unspecified 09/18/2013  . Obstructive sleep apnea 01/01/2016  . Recurrent kidney stones 07/08/2013   Follows with Dr Gaynelle Arabian He believes they are Calcium oxalate stones  . Thrombocytopenia ([place]) 07/07/2014  . Type 2 diabetes mellitus ([place])   . Vitamin D deficiency 07/02/2014    Past Surgical History:  Procedure Laterality Date  . COLONOSCOPY W/ POLYPECTOMY  LAST ONE 2013  . CORONARY STENT PLACEMENT  05/17/2014   PROXIMAL X 4  MID & DISTAL LAD  DIAGONAL   . KNEE ARTHROSCOPY  08/13/2011   right ARTHROSCOPY KNEE;  Surgeon: Gearlean Alf, MD;  Location: [facility];  Service: Orthopedics;  Laterality: Right;  medial  meniscal debridement, excision of plica, synovectomy  . KNEE ARTHROSCOPY Left 11/16/2013   Procedure: LEFT KNEE ARTHROSCOPY MEDIAL MENISCAL DEBRIDEMENT, CHONDROPLASTY;  Surgeon: Gearlean Alf, MD;  Location: [facility];  Service: Orthopedics;  Laterality: Left;  . KNEE ARTHROSCOPY W/ MENISCECTOMY Right 01-29-2011  twice, first trimmed mediscus and cyst  . LEFT HEART CATHETERIZATION WITH CORONARY ANGIOGRAM N/A 05/17/2014   Procedure: LEFT HEART CATHETERIZATION WITH CORONARY ANGIOGRAM;  Surgeon: Burnell Blanks, MD;  Location: [facility] CATH LAB;  Service: Cardiovascular;  Laterality: N/A;  . [place]   multiple, 10-12  . PERCUTANEOUS CORONARY STENT INTERVENTION (PCI-S)  05/17/2014   Procedure: PERCUTANEOUS CORONARY STENT INTERVENTION (PCI-S);  Surgeon: Burnell Blanks, MD;  Location: [facility] CATH LAB;  Service: Cardiovascular;;  Diag1 and Prox to Distal LAD    Family History  Problem Relation Age of Onset  . Hypertension Mother   . Cancer Mother 79        ovarian  . Heart disease Father        CHF  . Heart attack Father 9  . Cancer Sister        breast- just finished her last radiation  . Heart disease Maternal Grandfather        ?  [name] Heart attack Paternal Grandfather 39  . Heart attack Paternal Uncle 73       Sudden death  . Diabetes Neg Hx     Social History   Socioeconomic History  . Marital status: Married    Spouse name: Not on file  . Number of children: 2  . Years of education: Not on file  . Highest education level: Not on file  Occupational History  . Not on file  Tobacco Use  . Smoking status: Former Smoker    Packs/day: 1.00    Years: 4.00    Pack years: 4.00    Types: Cigarettes    Quit date: 06/14/1967    Years since quitting: 53.0  . Smokeless tobacco: Never Used  Substance and Sexual Activity  . Alcohol use: No    Alcohol/week: 0.0 standard drinks  . Drug use: No  . Sexual activity: Yes    Comment: lives with wife  Other Topics Concern  . 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     Social Determinants of Health   Financial Resource Strain: Low Risk   . Difficulty of Paying Living Expenses: Not hard at all  Food Insecurity: No Food Insecurity  . Worried About Charity fundraiser in the Last Year: Never true  . Ran Out of Food in the Last Year: Never true  Transportation Needs: No Transportation Needs  . Lack of Transportation (Medical): No  . Lack of Transportation (Non-Medical): No  Physical Activity: Not on file  Stress: Not on file  Social Connections: Not on file  Intimate Partner Violence: Not on file    Outpatient Medications Prior to Visit  Medication Sig Dispense Refill  . ACCU-CHEK FASTCLIX LANCETS MISC Use to test blood sugar 2 times daily. Dx: E11.40 204 each 3  . acetaminophen (TYLENOL) 500 MG tablet Take 1,000 mg by mouth every 6 (six) hours as needed for mild pain. Reported on 03/31/2015    . aspirin EC 81 MG tablet Take  81 mg by mouth daily with breakfast.     . cetirizine (ZYRTEC) 10 MG tablet TAKE 1 TABLET BY MOUTH EVERY MORNING 100 tablet 3  . Cholecalciferol (VITAMIN D PO) Take 5,000 Units by mouth daily.    . clotrimazole-betamethasone (LOTRISONE) cream Apply 1 application on to the skin 2 (two) times daily. 30 g 0  . fluticasone (FLONASE) 50 MCG/ACT nasal spray PLACE 2 SPRAYS INTO BOTH NOSTRILS DAILY. [place]  g 3  . glucagon (GLUCAGON EMERGENCY) 1 MG injection Inject 1 mg into the muscle once as needed for up to 1 dose. 1 each 12  . insulin NPH Human (HUMULIN N) 100 UNIT/ML injection INJECT UNDER SKIN 20 UNITS IN THE AM AND 40 UNITS AT BEDTIME. 30 mL 3  . insulin regular (HUMULIN R) 100 units/mL injection Inject 0.16-0.24 mLs (16-24 Units total) into the skin 3 (three) times daily before meals. 30 mL 3  . Insulin Syringe-Needle U-100 (TRUEPLUS INSULIN SYRINGE) 30G X 5/16" 0.5 ML MISC USE TO INJECT INSULIN 5 TIMES DAILY. 200 each 3  . losartan (COZAAR) 100 MG tablet Take 1 tablet (100 mg total) by mouth daily. 90 tablet 0  . metoprolol tartrate (LOPRESSOR) 25 MG tablet Take 1 tablet (25 mg total) by mouth 2 (two) times daily. 180 tablet 3  . Multiple Vitamin (MULTIVITAMIN WITH MINERALS) TABS tablet Take 1 tablet by mouth daily.    . nitroGLYCERIN (NITROSTAT) 0.4 MG SL tablet Place 1 tablet (0.4 mg total) under the tongue every 5 (five) minutes as needed for chest pain. 25 tablet 3  . prasugrel (EFFIENT) 10 MG TABS tablet Take 1 tablet (10 mg total) by mouth daily. 90 tablet 3  . simvastatin (ZOCOR) 40 MG tablet TAKE 1 TABLET (40 MG) BY MOUTH AT BEDTIME. 90 tablet 2  . Polyethylene Glycol 3350 (MIRALAX PO) Take by mouth as needed. Reported on 03/31/2015     No facility-administered medications prior to visit.    Allergies  Allergen Reactions  . Ciprofloxacin Other (See Comments)    Abdominal pain  . Lipitor [Atorvastatin Calcium] Other (See Comments)    Severe muscle pain  . Lisinopril Cough  . Metformin  And Related Diarrhea    Review of Systems  Constitutional: Negative for chills and fever.  HENT: Positive for congestion (Sinus) and sinus pain.   Eyes:       Itchy watery eyes   Respiratory: Negative for cough, shortness of breath and wheezing.   Cardiovascular: Positive for PND. Negative for chest pain, palpitations and leg swelling.  Gastrointestinal: Negative for abdominal pain, diarrhea, nausea and vomiting.  Musculoskeletal: Negative for back pain.  Skin: Negative for rash.  Neurological: Negative for dizziness and headaches.  Endo/Heme/Allergies: Positive for environmental allergies.       Objective:    Physical Exam Vitals and nursing note reviewed.  Constitutional:      General: He is not in acute distress.    Appearance: Normal appearance. He is not ill-appearing.  HENT:     Head: Atraumatic.  Pulmonary:     Effort: Pulmonary effort is normal.  Neurological:     Mental Status: He is alert and oriented to person, place, and time.  Psychiatric:        Behavior: Behavior normal.     BP 129/63   Pulse (!) 58   Temp 97.8 F (36.6 C)   SpO2 96%  Wt Readings from Last 3 Encounters:  05/22/20 261 lb 12.8 oz (118.8 kg)  11/27/19 263 lb (119.3 kg)  08/28/19 264 lb (119.7 kg)    Diabetic Foot Exam - Simple   No data filed    Lab Results  Component Value Date   WBC 5.7 03/11/2020   HGB 14.4 03/11/2020   HCT 42.6 03/11/2020   PLT 166.0 03/11/2020   GLUCOSE 154 (H) 03/11/2020   CHOL 137 03/11/2020   TRIG 174.0 (H) 03/11/2020   HDL 32.90 (L) 03/11/2020   LDLDIRECT 108.0  04/16/2014   LDLCALC 69 03/11/2020   ALT 23 03/11/2020   AST 22 03/11/2020   NA 137 03/11/2020   K 4.1 03/11/2020   CL 104 03/11/2020   CREATININE 0.84 03/11/2020   BUN 16 03/11/2020   CO2 26 03/11/2020   TSH 2.53 03/11/2020   PSA 1.92 09/25/2018   INR 1.02 05/17/2014   HGBA1C 6.8 (A) 05/22/2020   MICROALBUR 1.7 06/24/2015    Lab Results  Component Value Date   TSH 2.53  03/11/2020   Lab Results  Component Value Date   WBC 5.7 03/11/2020   HGB 14.4 03/11/2020   HCT 42.6 03/11/2020   MCV 93.8 03/11/2020   PLT 166.0 03/11/2020   Lab Results  Component Value Date   NA 137 03/11/2020   K 4.1 03/11/2020   CO2 26 03/11/2020   GLUCOSE 154 (H) 03/11/2020   BUN 16 03/11/2020   CREATININE 0.84 03/11/2020   BILITOT 0.6 03/11/2020   ALKPHOS 83 03/11/2020   AST 22 03/11/2020   ALT 23 03/11/2020   PROT 6.8 03/11/2020   ALBUMIN 4.0 03/11/2020   CALCIUM 8.7 03/11/2020   ANIONGAP 8 05/18/2014   GFR 85.19 03/11/2020   Lab Results  Component Value Date   CHOL 137 03/11/2020   Lab Results  Component Value Date   HDL 32.90 (L) 03/11/2020   Lab Results  Component Value Date   LDLCALC 69 03/11/2020   Lab Results  Component Value Date   TRIG 174.0 (H) 03/11/2020   Lab Results  Component Value Date   CHOLHDL 4 03/11/2020   Lab Results  Component Value Date   HGBA1C 6.8 (A) 05/22/2020       Assessment & Plan:   Problem List Items Addressed This Visit   None   Visit Diagnoses    Upper respiratory tract infection, unspecified type    -  Primary   Relevant Medications   amoxicillin (AMOXIL) 875 MG tablet     con't zyrtec and add pepcid back Use flonase Pt will get a covid test   Meds ordered this encounter  Medications  . amoxicillin (AMOXIL) 875 MG tablet    Sig: Take 1 tablet (875 mg total) by mouth 2 (two) times daily.    Dispense:  20 tablet    Refill:  0    I discussed the assessment and treatment plan with the patient. The patient was provided an opportunity to ask questions and all were answered. The patient agreed with the plan and demonstrated an understanding of the instructions.   The patient was advised to call back or seek an in-person evaluation if the symptoms worsen or if the condition fails to improve as anticipated.  I provided 20 minutes of face-to-face time during this encounter.   Ann Held,  DO [facility] [phone] (phone) [phone] (fax)  [place]

## 2020-06-24 NOTE — Telephone Encounter (Signed)
Do you want patient to schedule an appointment for the medication? Or do you want me to send in the medication?

## 2020-06-25 ENCOUNTER — Ambulatory Visit: Payer: Medicare HMO | Attending: Internal Medicine

## 2020-06-25 ENCOUNTER — Other Ambulatory Visit (HOSPITAL_BASED_OUTPATIENT_CLINIC_OR_DEPARTMENT_OTHER): Payer: Self-pay

## 2020-06-25 DIAGNOSIS — Z20822 Contact with and (suspected) exposure to covid-19: Secondary | ICD-10-CM | POA: Diagnosis not present

## 2020-06-26 ENCOUNTER — Other Ambulatory Visit: Payer: Self-pay | Admitting: Family Medicine

## 2020-06-26 ENCOUNTER — Encounter: Payer: Self-pay | Admitting: Internal Medicine

## 2020-06-26 ENCOUNTER — Encounter: Payer: Self-pay | Admitting: Family Medicine

## 2020-06-26 ENCOUNTER — Other Ambulatory Visit (HOSPITAL_BASED_OUTPATIENT_CLINIC_OR_DEPARTMENT_OTHER): Payer: Self-pay

## 2020-06-26 LAB — NOVEL CORONAVIRUS, NAA: SARS-CoV-2, NAA: NOT DETECTED

## 2020-06-26 LAB — SARS-COV-2, NAA 2 DAY TAT

## 2020-06-26 MED ORDER — GUAIFENESIN 400 MG PO TABS
400.0000 mg | ORAL_TABLET | Freq: Two times a day (BID) | ORAL | 1 refills | Status: DC | PRN
  Filled 2020-06-26: qty 60, 30d supply, fill #0

## 2020-06-30 ENCOUNTER — Encounter: Payer: Self-pay | Admitting: Internal Medicine

## 2020-07-01 ENCOUNTER — Telehealth: Payer: Medicare HMO | Admitting: Family Medicine

## 2020-07-03 ENCOUNTER — Ambulatory Visit: Payer: Medicare HMO | Admitting: Podiatry

## 2020-07-10 ENCOUNTER — Ambulatory Visit: Payer: Medicare HMO | Admitting: Podiatry

## 2020-07-11 ENCOUNTER — Ambulatory Visit: Payer: Medicare HMO | Attending: Internal Medicine

## 2020-07-11 ENCOUNTER — Other Ambulatory Visit (HOSPITAL_BASED_OUTPATIENT_CLINIC_OR_DEPARTMENT_OTHER): Payer: Self-pay

## 2020-07-11 DIAGNOSIS — Z23 Encounter for immunization: Secondary | ICD-10-CM

## 2020-07-11 MED ORDER — PFIZER-BIONT COVID-19 VAC-TRIS 30 MCG/0.3ML IM SUSP
INTRAMUSCULAR | 0 refills | Status: DC
  Filled 2020-07-11: qty 0.3, 1d supply, fill #0

## 2020-07-11 NOTE — Progress Notes (Signed)
   [id number]  Name:  Patric Buckhalter.    MRN: [id number] DOB: 08-22-1945  07/11/2020  Mr. Zweig was observed post Covid-19 immunization for 15 minutes without incident. He was provided with Vaccine Information Sheet and instruction to access the V-Safe system.   Mr. Petrosino was instructed to call 911 with any severe reactions post vaccine: [name] Difficulty breathing  . Swelling of face and throat  . A fast heartbeat  . A bad rash all over body  . Dizziness and weakness   Immunizations Administered    Name Date Dose VIS Date Route   PFIZER Comrnaty(Gray TOP) Covid-19 Vaccine 07/11/2020  2:14 PM 0.3 mL 02/21/2020 Intramuscular   Manufacturer: Novinger   Lot: [id number]   NDC: [phone]

## 2020-07-15 ENCOUNTER — Other Ambulatory Visit (HOSPITAL_BASED_OUTPATIENT_CLINIC_OR_DEPARTMENT_OTHER): Payer: Self-pay

## 2020-07-24 ENCOUNTER — Ambulatory Visit: Payer: Medicare HMO | Admitting: Podiatry

## 2020-07-24 ENCOUNTER — Other Ambulatory Visit: Payer: Self-pay

## 2020-07-24 DIAGNOSIS — M79676 Pain in unspecified toe(s): Secondary | ICD-10-CM | POA: Diagnosis not present

## 2020-07-24 DIAGNOSIS — E1149 Type 2 diabetes mellitus with other diabetic neurological complication: Secondary | ICD-10-CM

## 2020-07-24 DIAGNOSIS — B351 Tinea unguium: Secondary | ICD-10-CM

## 2020-07-28 NOTE — Progress Notes (Signed)
Subjective: 74 y.o. returns the office today for painful, elongated, thickened toenails which he cannot trim himself. Denies any redness or drainage around the nails.  No open lesions he reports.  His wife is present today.  They have been using the antifungal cream which helps the redness go away but then they stop using it starts to come back.  Denies any open sores or pustules.  He has no new concerns. Denies any systemic complaints such as fevers, chills, nausea, vomiting.   PCP: Mosie Lukes, MD  Endocrinologist: Dr. Benjiman Core, MD   Last A1c was 6.8 on 05/22/2020   Objective: AAO 3, NAD DP/PT pulses palpable, CRT less than 3 seconds Sensation is mildly decreased with SWM but overall no change.  Neuropathy symptoms are unchanged. Nails hypertrophic, dystrophic, elongated, brittle, discolored 10. There is tenderness overlying the nails 1-5 bilaterally. There is no surrounding erythema or drainage along the nail sites. Mild red discoloration present to the dorsal aspect the digits appears to be dry, left skin.  Occasionally does itch.  There is no open lesions or pustules. No pain with calf compression, swelling, warmth, erythema.  Assessment: Patient presents with symptomatic onychomycosis; likely tinea pedis  Plan: -Treatment options including alternatives, risks, complications were discussed -Nails sharply debrided 10 without complication/bleeding. -Continue the cream as needed.  We discussed measures to help prevent this from happening.  He wears a shoe and sock all day.  Discussed trying changes to the shoes and socks to allow the feet to breathe and help with any moisture as it did this is contributing to the symptoms. -Continue to monitor neuropathy-since the symptoms are stable. -Discussed daily foot inspection. If there are any changes, to call the office immediately.  -Follow-up in 3 months or sooner if any problems are to arise. In the meantime, encouraged to call  the office with any questions, concerns, changes symptoms.  Celesta Gentile, DPM

## 2020-08-08 DIAGNOSIS — G4733 Obstructive sleep apnea (adult) (pediatric): Secondary | ICD-10-CM | POA: Diagnosis not present

## 2020-09-03 ENCOUNTER — Ambulatory Visit: Payer: Medicare HMO | Admitting: Pulmonary Disease

## 2020-09-22 ENCOUNTER — Other Ambulatory Visit (INDEPENDENT_AMBULATORY_CARE_PROVIDER_SITE_OTHER): Payer: Medicare HMO

## 2020-09-22 ENCOUNTER — Other Ambulatory Visit: Payer: Self-pay

## 2020-09-22 DIAGNOSIS — E1142 Type 2 diabetes mellitus with diabetic polyneuropathy: Secondary | ICD-10-CM

## 2020-09-22 DIAGNOSIS — E559 Vitamin D deficiency, unspecified: Secondary | ICD-10-CM | POA: Diagnosis not present

## 2020-09-22 DIAGNOSIS — E782 Mixed hyperlipidemia: Secondary | ICD-10-CM | POA: Diagnosis not present

## 2020-09-22 DIAGNOSIS — I10 Essential (primary) hypertension: Secondary | ICD-10-CM

## 2020-09-22 LAB — COMPREHENSIVE METABOLIC PANEL
ALT: 20 U/L (ref 0–53)
AST: 20 U/L (ref 0–37)
Albumin: 4.1 g/dL (ref 3.5–5.2)
Alkaline Phosphatase: 89 U/L (ref 39–117)
BUN: 14 mg/dL (ref 6–23)
CO2: 26 mEq/L (ref 19–32)
Calcium: 8.9 mg/dL (ref 8.4–10.5)
Chloride: 103 mEq/L (ref 96–112)
Creatinine, Ser: 0.8 mg/dL (ref 0.40–1.50)
GFR: 86.13 mL/min (ref >60.00)
Glucose, Bld: 182 mg/dL — ABNORMAL HIGH (ref 70–99)
Potassium: 4.5 mEq/L (ref 3.5–5.1)
Sodium: 137 mEq/L (ref 135–145)
Total Bilirubin: 0.9 mg/dL (ref 0.2–1.2)
Total Protein: 6.8 g/dL (ref 6.0–8.3)

## 2020-09-22 LAB — LIPID PANEL
Cholesterol: 116 mg/dL (ref 0–200)
HDL: 32.9 mg/dL — ABNORMAL LOW (ref >39.00)
LDL Cholesterol: 61 mg/dL (ref 0–99)
NonHDL: 83.44
Total CHOL/HDL Ratio: 4
Triglycerides: 113 mg/dL (ref 0.0–149.0)
VLDL: 22.6 mg/dL (ref 0.0–40.0)

## 2020-09-22 LAB — CBC
HCT: 42.2 % (ref 39.0–52.0)
Hemoglobin: 14.4 g/dL (ref 13.0–17.0)
MCHC: 34.1 g/dL (ref 30.0–36.0)
MCV: 93.3 fl (ref 78.0–100.0)
Platelets: 145 10*3/uL — ABNORMAL LOW (ref 150.0–400.0)
RBC: 4.52 Mil/uL (ref 4.22–5.81)
RDW: 14.3 % (ref 11.5–15.5)
WBC: 4.8 10*3/uL (ref 4.0–10.5)

## 2020-09-22 LAB — VITAMIN D 25 HYDROXY (VIT D DEFICIENCY, FRACTURES): VITD: 39.82 ng/mL (ref 30.00–100.00)

## 2020-09-22 LAB — HEMOGLOBIN A1C: Hgb A1c MFr Bld: 7.7 % — ABNORMAL HIGH (ref 4.6–6.5)

## 2020-09-22 LAB — TSH: TSH: 2.63 u[IU]/mL (ref 0.35–5.50)

## 2020-09-23 ENCOUNTER — Ambulatory Visit: Payer: Medicare HMO | Admitting: Podiatry

## 2020-09-24 ENCOUNTER — Encounter: Payer: Self-pay | Admitting: Internal Medicine

## 2020-09-24 ENCOUNTER — Other Ambulatory Visit: Payer: Self-pay

## 2020-09-24 ENCOUNTER — Ambulatory Visit (INDEPENDENT_AMBULATORY_CARE_PROVIDER_SITE_OTHER): Payer: Medicare HMO | Admitting: Internal Medicine

## 2020-09-24 VITALS — BP 120/72 | HR 65 | Ht 71.0 in | Wt 250.6 lb

## 2020-09-24 DIAGNOSIS — E1142 Type 2 diabetes mellitus with diabetic polyneuropathy: Secondary | ICD-10-CM | POA: Diagnosis not present

## 2020-09-24 DIAGNOSIS — E669 Obesity, unspecified: Secondary | ICD-10-CM

## 2020-09-24 DIAGNOSIS — E782 Mixed hyperlipidemia: Secondary | ICD-10-CM | POA: Diagnosis not present

## 2020-09-24 NOTE — Progress Notes (Signed)
Patient ID: Michael Cunningham., male   DOB: January 05, 1946, 75 y.o.   MRN: [id number]  This visit occurred during the SARS-CoV-2 public health emergency.  Safety protocols were in place, including screening questions prior to the visit, additional usage of staff PPE, and extensive cleaning of exam room while observing appropriate contact time as indicated for disinfecting solutions.   HPI: Michael Cunningham. is a 75 y.o.-year-old male, presenting for f/u DM2, dx in 1999, insulin-dependent since ~2008, controlled, with complications (CAD, peripheral neuropathy, DR). Last visit 4 months ago. He is 60% disabled >> highest priority group within his insurance.  He is here with his wife offers part of the history, especially regarding his medications, insurance, and past medical history.  Interim history: 3 months ago he had a low blood sugar, 24, after he took regular insulin but was not able to eat.  He did not have any such lows after he started the CGM. No increased urination, blurry vision, nausea, chest pain.  Reviewed HbA1c levels: Lab Results  Component Value Date   HGBA1C 7.7 (H) 09/22/2020   HGBA1C 6.8 (A) 05/22/2020   HGBA1C 7.6 (H) 03/11/2020  04/08/2020: HbA1c 7.4%-results received after the visit 05/2016: HbA1c calculated from the fructosamine is better, at 5.9%. 10/01/2015: HbA1c calculated from fructosamine is still great, at 6.6%. 06/24/2015: HbA1c calculated from fructosamine is much lower, at 6.4%.  Pt is on a regimen of: Insulin Before breakfast Before lunch Before dinner Bedtime  Regular  30 >> 20-24  25-30 >> 20-24 >> 18-20  20-28 >> 16-20 >> 16-30    NPH  20      47 >> 40 >> 25-35    Please do not use more than 15 units of R insulin before a meal prior to exercise (rehab, working in the yard, etc.). Please take the R insulin 30 min before a meal, unless your sugars are <90, in which case you need to take it at the start of the meal. He cannot afford analog insulin. He tried  Metformin R and XR >> diarrhea He tried Januvia.  Pt checks his sugars more than 4 times a day with his freestyle libre 2 CGM:   Previously: - am: 114-135 >> 111, 120-130, 172 >> 107-188, usu. 130-160 - 2h after b'fast:  71-190, 246 >> 69, 141-177, 212 >> n/c >> 201 - before lunch:  75-120, 200 >> 57, 105-120 >> 61-139, 155, 177 - 2h after lunch:  70 >> n/c  >> 55, 75 >> n/c >> 74, 196 - before dinner:  90-130 >> 64, 82, 100-112 >> 44, 56-131, 166, 226 - 2h after dinner: 111-132, 254 >> 117-132 >> n/c  - bedtime: 69-196 >> 66-152, 189 >> 114-180 >> n/c >> 104-198, 238 - nighttime:90 >> n/c >> 47, 59 Lowest sugar was 35 x1 >> 55 >> 71 >> 57 (delayed lunch) >> 44 >> 24, 50s; he has hypoglycemia awareness in the 90s. Highest sugar was 172 >>> 238 >> 300.  Glucometer: AccuChek [employer]iva >> Precision Extra by [employer] (given by [facility]  Pt's meals are: - Breakfast: cheerios + skim milk + banana  - Lunch: sandwich or leftover  - Dinner: meat + veggies + starch - Snacks: PB crackers; wheat thins, apple or grapes, pear   -No CKD, last BUN/creatinine:  Lab Results  Component Value Date   BUN 14 09/22/2020   CREATININE 0.80 09/22/2020  04/08/2020: 17/0.92, glucose 224, ACR 7.9 On losartan.  -+ HL; last set of lipids:  Lab Results  Component Value Date   CHOL 116 09/22/2020   HDL 32.90 (L) 09/22/2020   LDLCALC 61 09/22/2020   LDLDIRECT 108.0 04/16/2014   TRIG 113.0 09/22/2020   CHOLHDL 4 09/22/2020   On simvastatin 40.  - last eye exam was in 10/2019: No DR, previously mild DR.  He also has angle-closure glaucoma and had surgery in both eyes.  + Cataracts. Dr. Ellie Lunch.  - no numbness and tingling in his  feet - [facility] (Dr Amalia Hailey).  He has mild peripheral neuropathy in his right foot.  He has toenail dermatophytosis.  He was dx'ed with Diabetic hand sd. (cheiroarthropathy) >> he had steroid injections.  He also has a history of HTN.  He has a history of vitamin D  deficiency, but latest levels have been normal: Lab Results  Component Value Date   VD25OH 39.82 09/22/2020   VD25OH 38 11/21/2019   VD25OH 38.06 08/09/2019   VD25OH 40.49 12/28/2018   VD25OH 42.36 02/27/2018   VD25OH 41.27 08/19/2017   VD25OH 38.41 02/09/2017   VD25OH 45.74 12/25/2015   VD25OH 43.41 06/24/2015   VD25OH 29.93 (L) 03/26/2015   ROS: + See HPI  Past Medical History:  Diagnosis Date   CAD (coronary artery disease)    a. 05/2014 [place] s/p DES x4 to mid-distLAD and DESx1 to DI   Complication of anesthesia    11'12 Surgery with excrutiating "head about to explode" upon awakening  -lasted several hours postop  ( SURGERY DONE 08-13-2011 AT [facility] PT DID OK POST OP)   Diverticulosis of colon    LEFT SIDE   Exposure to Agent [facility] 10/18/2016   [place]   History of small bowel obstruction    X2  28 YRS AGO AND LAST ONE 04/2014--  RESOLVED WITHOUT SURGICAL INTERVENTION   Hyperlipidemia    Hypertension    Knee pain, bilateral 08/13/2011   Left ureteral calculus    Multiple lipomas    hx. mulitple and some remains -arms, legs   Obesity, unspecified 09/18/2013   Obstructive sleep apnea 01/01/2016   Recurrent kidney stones 07/08/2013   Follows with Dr Gaynelle Arabian He believes they are Calcium oxalate stones   Thrombocytopenia ([facility]) 07/07/2014   Type 2 diabetes mellitus ([place])    Vitamin D deficiency 07/02/2014   Past Surgical History:  Procedure Laterality Date   COLONOSCOPY W/ POLYPECTOMY  LAST ONE 2013   CORONARY STENT PLACEMENT  05/17/2014   PROXIMAL X 4  MID & DISTAL LAD  DIAGONAL    KNEE ARTHROSCOPY  08/13/2011   right ARTHROSCOPY KNEE;  Surgeon: Gearlean Alf, MD;  Location: [facility]S;  Service: Orthopedics;  Laterality: Right;  medial  meniscal debridement, excision of plica, synovectomy   KNEE ARTHROSCOPY Left 11/16/2013   Procedure: LEFT KNEE ARTHROSCOPY MEDIAL MENISCAL DEBRIDEMENT, CHONDROPLASTY;  Surgeon: Gearlean Alf, MD;  Location: [facility];  Service: Orthopedics;   Laterality: Left;   KNEE ARTHROSCOPY W/ MENISCECTOMY Right 01-29-2011   twice, first trimmed mediscus and cyst   LEFT HEART CATHETERIZATION WITH CORONARY ANGIOGRAM N/A 05/17/2014   Procedure: LEFT HEART CATHETERIZATION WITH CORONARY ANGIOGRAM;  Surgeon: Burnell Blanks, MD;  [facility] C[facility] LAB;  Service: Cardiovascular;  Laterality: N/A;   LIPOMA EXCISION  1975   multiple, 10-12   PERCUTANEOUS CORONARY STENT INTERVENTION (PCI-S)  05/17/2014   Procedure: PERCUTANEOUS CORONARY STENT INTERVENTION (PCI-S);  Surgeon: Burnell Blanks, MD;  Location: [facility] CATH LAB;  Service: Cardiovascular;;  Diag1 and Prox to Distal  LAD   History   Social History   Marital Status: Married    Spouse Name: N/A   Number of Children: 2   Social History Main Topics   Smoking status: Former Smoker -- 1.00 packs/day for 4 years    Types: Cigarettes    Quit date: 06/14/1967   Smokeless tobacco: Never Used   Alcohol Use: No   Drug Use: No   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     Current Outpatient Medications on File Prior to Visit  Medication Sig Dispense Refill   ACCU-CHEK FASTCLIX LANCETS MISC Use to test blood sugar 2 times daily. Dx: E11.40 204 each 3   acetaminophen (TYLENOL) 500 MG tablet Take 1,000 mg by mouth every 6 (six) hours as needed for mild pain. Reported on 03/31/2015     amoxicillin (AMOXIL) 875 MG tablet Take 1 tablet (875 mg total) by mouth 2 (two) times daily. 20 tablet 0   aspirin EC 81 MG tablet Take 81 mg by mouth daily with breakfast.      azithromycin (ZITHROMAX) 250 MG tablet Take 2 tablets by mouth on day 1 then 1 tablet daily for 4 days. (Patient taking differently: Take 2 tablets by mouth on day 1 then 1 tablet daily for 4 days.) 6 tablet 0   cetirizine (ZYRTEC) 10 MG tablet TAKE 1 TABLET BY MOUTH EVERY MORNING 100 tablet 3   Cholecalciferol (VITAMIN D PO) Take 5,000 Units by  mouth daily.     Cholecalciferol 50 MCG (2000 UT) TABS Take 2 tablets by mouth daily.     clotrimazole-betamethasone (LOTRISONE) cream Apply 1 application on to the skin 2 (two) times daily. 30 g 0   COVID-19 mRNA Vac-TriS, Pfizer, (PFIZER-BIONT COVID-19 VAC-TRIS) SUSP injection Inject into the muscle. 0.3 mL 0   fluticasone (FLONASE) 50 MCG/ACT nasal spray PLACE 2 SPRAYS INTO BOTH NOSTRILS DAILY. 16 g 3   glucagon (GLUCAGON EMERGENCY) 1 MG injection Inject 1 mg into the muscle once as needed for up to 1 dose. 1 each 12   glucose 4 GM chewable tablet Chew by mouth.     guaifenesin (HUMIBID E) 400 MG TABS tablet Take 1 tablet (400 mg total) by mouth 2 (two) times daily as needed. 60 tablet 1   insulin NPH Human (HUMULIN N) 100 UNIT/ML injection INJECT UNDER SKIN 20 UNITS IN THE AM AND 40 UNITS AT BEDTIME. 30 mL 3   insulin regular (HUMULIN R) 100 units/mL injection Inject 0.16-0.24 mLs (16-24 Units total) into the skin 3 (three) times daily before meals. 30 mL 3   Insulin Syringe-Needle U-100 (TRUEPLUS INSULIN SYRINGE) 30G X 5/16" 0.5 ML MISC USE TO INJECT INSULIN 5 TIMES DAILY. 200 each 3   losartan (COZAAR) 100 MG tablet Take 1 tablet (100 mg total) by mouth daily. 90 tablet 0   metoprolol tartrate (LOPRESSOR) 25 MG tablet Take 1 tablet (25 mg total) by mouth 2 (two) times daily. 180 tablet 3   Multiple Vitamin (MULTIVITAMIN WITH MINERALS) TABS tablet Take 1 tablet by mouth daily.     nitroGLYCERIN (NITROSTAT) 0.4 MG SL tablet Place 1 tablet (0.4 mg total) under the tongue every 5 (five) minutes as needed for chest pain. 25 tablet 3   polyethylene glycol powder (GLYCOLAX/MIRALAX) 17 GM/SCOOP powder Take by mouth.     prasugrel (EFFIENT) 10 MG TABS tablet Take 1 tablet (10 mg  total) by mouth daily. 90 tablet 3   simvastatin (ZOCOR) 40 MG tablet TAKE 1 TABLET (40 MG) BY MOUTH AT BEDTIME. 90 tablet 2   [DISCONTINUED] famotidine (PEPCID) 20 MG tablet Take 1 tablet (20 mg total) by mouth at bedtime.  (Patient not taking: Reported on 03/17/2020) 30 tablet 3   No current facility-administered medications on file prior to visit.   Allergies  Allergen Reactions   Ciprofloxacin Other (See Comments)    Abdominal pain   Lipitor [Atorvastatin Calcium] Other (See Comments)    Severe muscle pain   Lisinopril Cough   Metformin And Related Diarrhea   Family History  Problem Relation Age of Onset   Hypertension Mother    Cancer Mother 63       ovarian   Heart disease Father        CHF   Heart attack Father 79   Cancer Sister        breast- just finished her last radiation   Heart disease Maternal Grandfather        ?   Heart attack Paternal Grandfather 84   Heart attack Paternal Uncle 61       Sudden death   Diabetes Neg Hx    PE:  BP 120/72 (BP Location: Right Arm, Patient Position: Sitting, Cuff Size: Normal)   Pulse 65   Ht 5' 11"  (1.803 m)   Wt 250 lb 9.6 oz (113.7 kg)   SpO2 96%   BMI 34.95 kg/m  Body mass index is 34.95 kg/m.  Wt Readings from Last 3 Encounters:  09/24/20 250 lb 9.6 oz (113.7 kg)  05/22/20 261 lb 12.8 oz (118.8 kg)  11/27/19 263 lb (119.3 kg)   Constitutional: overweight, in NAD Eyes: PERRLA, EOMI, no exophthalmos ENT: moist mucous membranes, no thyromegaly, no cervical lymphadenopathy Cardiovascular: RRR, No MRG Respiratory: CTA B Gastrointestinal: abdomen soft, NT, ND, BS+ Musculoskeletal: no deformities, strength intact in all 4 Skin: moist, warm, no rashes Neurological: no tremor with outstretched hands, DTR normal in all 4  ASSESSMENT: 1. DM2, insulin-dependent, now better controlled, with complications - CAD, status post stent 05/2014 - Dr Percival Spanish - Peripheral neuropathy - DR  2. HL  3.  Obesity class II  PLAN:  1. Patient with longstanding, previously well-controlled type 2 diabetes, on basal-bolus insulin regimen, returning at last visit after an absence of 1.5 years.  HbA1c was at goal, at 6.8%, but he had lows in the 40s to  60s, mostly at night but occasionally before lunch and dinner.  My suspicion was that he was taking too much regular insulin and we reduce the doses.  He was also not injecting the regular insulin 30 minutes before a meal, but only 10 minutes at most and many times after the meals and we discussed about the importance of injecting 30 minutes before a meal except for the times when he has lows.  I also suggested a CGM and he was planning to obtain this from the [place].  We discussed about situations in which he needed to use the glucagon kit (he had this at home).  Since last visit, he had a very low blood sugar, 24, after taking regular insulin and not eating.  He now is very careful to eat within 30 minutes of injecting insulin. -At today's visit, we reviewed his most recent HbA1c which was from 2 days ago, and this was higher, at 7.7%. CGM interpretation: -At today's visit, we reviewed his CGM downloads: It appears that  79% of values are in target range (goal >70%), while 90% are higher than 180 (goal <25%), and 2% are lower than 70 (goal <4%).  The calculated average blood sugar is 141.  The projected HbA1c for the next 3 months ([facility]) is 6.7%. -Reviewing the CGM trends, his sugars are high after breakfast and then they drop abruptly after approximately 10 AM.  They are stable over lunch and increase abruptly after dinner.  They are then dropping overnight and upon questioning, he is increasing the dose of NPH when the sugars are high after dinner. -At today's visit, I advised him to decrease the dose of NPH in the morning as I feel that this may be related to his decrease in blood sugars after an initial post breakfast hyperglycemia.  We will keep the dose of regular insulin with lunch the same, but since sugars after dinner are higher, we will go ahead and increase his regular insulin before this meal.  I advised him to take a lower dose of NPH at bedtime to avoid dropping his sugars too low overnight.  We also  discussed that if his sugars are lower than 70, he may need to take 50% of the regular insulin without meal.  If the sugars are lower than 60s, he should not take any insulin without meal. - I advised him to: Patient Instructions  Please continue: Insulin Before breakfast Before lunch Before dinner Bedtime  Regular  20-24 >> 26-28  20-24  16-20 >> 22-26    NPH  20 >> 16      25-35 >> 24-26    Please do not use more than 15 units of R insulin before a meal prior to exercise (rehab, working in the yard, etc.). Please take the R insulin 30 min before a meal, unless your sugars are <90, in which case you need to take it at the start of the meal. If sugars before the meal <70, may take 50% of the R insulin with that meal.  Please return in 3-4 months.  - advised to check sugars at different times of the day - 4x a day, rotating check times - advised for yearly eye exams >> he is UTD - return to clinic in 3-4 months   2. HL -Reviewed latest lipid panel from 2 days ago: Fractions at goal with exception of a low HDL: Lab Results  Component Value Date   CHOL 116 09/22/2020   HDL 32.90 (L) 09/22/2020   LDLCALC 61 09/22/2020   LDLDIRECT 108.0 04/16/2014   TRIG 113.0 09/22/2020   CHOLHDL 4 09/22/2020  -Continue Zocor 40 mg daily without side effects.  3.  Obesity class II -Weight was stable at last visit -At this visit, however, he lost 11 pounds since last visit   Philemon Kingdom, MD PhD [facility] Endocrinology

## 2020-09-24 NOTE — Patient Instructions (Addendum)
Please continue: Insulin Before breakfast Before lunch Before dinner Bedtime  Regular  20-24 >> 26-28  20-24  16-20 >> 22-26    NPH  20 >> 16      25-35 >> 24-26    Please do not use more than 15 units of R insulin before a meal prior to exercise (rehab, working in the yard, etc.). Please take the R insulin 30 min before a meal, unless your sugars are <90, in which case you need to take it at the start of the meal. If sugars before the meal <70, may take 50% of the R insulin with that meal.  Please return in 3-4 months.

## 2020-10-02 ENCOUNTER — Other Ambulatory Visit: Payer: Self-pay

## 2020-10-02 ENCOUNTER — Encounter: Payer: Self-pay | Admitting: Family Medicine

## 2020-10-02 ENCOUNTER — Ambulatory Visit (INDEPENDENT_AMBULATORY_CARE_PROVIDER_SITE_OTHER): Payer: Medicare HMO | Admitting: Family Medicine

## 2020-10-02 VITALS — BP 120/60 | HR 50 | Temp 97.8°F | Resp 12 | Ht 71.0 in | Wt 251.0 lb

## 2020-10-02 DIAGNOSIS — E559 Vitamin D deficiency, unspecified: Secondary | ICD-10-CM

## 2020-10-02 DIAGNOSIS — E1142 Type 2 diabetes mellitus with diabetic polyneuropathy: Secondary | ICD-10-CM

## 2020-10-02 DIAGNOSIS — I10 Essential (primary) hypertension: Secondary | ICD-10-CM | POA: Diagnosis not present

## 2020-10-02 DIAGNOSIS — D696 Thrombocytopenia, unspecified: Secondary | ICD-10-CM

## 2020-10-02 DIAGNOSIS — E782 Mixed hyperlipidemia: Secondary | ICD-10-CM

## 2020-10-02 MED ORDER — ACCU-CHEK FASTCLIX LANCETS MISC: 5 refills | Status: DC

## 2020-10-02 NOTE — Progress Notes (Addendum)
Patient ID: Michael Cunningham., male    DOB: 01-16-1946  Age: 75 y.o. MRN: [id number]    Subjective:  Subjective  HPI Michael Cunningham. presents for office visit today for follow up on htn and diabetes. His insulin has been contributing to some sugar drops which have been affecting his sleep quality. He reports that yesterday he was getting readings as low as 50s. Denies CP/palp/SOB/HA/congestion/fevers/GI or GU c/o. Taking meds as prescribed.  He is wondering if he should continue taking Cetrizine 10 mg that was initially px for postnasal drip. He states that he still experiences a runny nose on occasion. Him and his wife express interest in comparing lancet to insuline sensor readings, but have been having trouble approving it through insurance.  Review of Systems  Constitutional:  Negative for chills, fatigue and fever.  HENT:  Negative for congestion, rhinorrhea, sinus pressure, sinus pain and sore throat.   Eyes:  Negative for pain.  Respiratory:  Negative for cough and shortness of breath.   Cardiovascular:  Negative for chest pain, palpitations and leg swelling.  Gastrointestinal:  Negative for abdominal pain, blood in stool, diarrhea, nausea and vomiting.  Genitourinary:  Negative for flank pain, frequency and penile pain.  Musculoskeletal:  Negative for back pain.  Neurological:  Negative for headaches.   History Past Medical History:  Diagnosis Date   CAD (coronary artery disease)    a. 05/2014 [place] s/p DES x4 to mid-distLAD and DESx1 to DI   Complication of anesthesia    11'12 Surgery with excrutiating "head about to explode" upon awakening  -lasted several hours postop  ( SURGERY DONE 08-13-2011 AT [facility] PT DID OK POST OP)   Diverticulosis of colon    LEFT SIDE   Exposure to Agent [facility] 10/18/2016   [place]   History of small bowel obstruction    X2  28 YRS AGO AND LAST ONE 04/2014--  RESOLVED WITHOUT SURGICAL INTERVENTION   Hyperlipidemia    Hypertension    Knee pain,  bilateral 08/13/2011   Left ureteral calculus    Multiple lipomas    hx. mulitple and some remains -arms, legs   Obesity, unspecified 09/18/2013   Obstructive sleep apnea 01/01/2016   Recurrent kidney stones 07/08/2013   Follows with Dr Gaynelle Arabian He believes they are Calcium oxalate stones   Thrombocytopenia ([place]) 07/07/2014   Type 2 diabetes mellitus ([place])    Vitamin D deficiency 07/02/2014    He has a past surgical history that includes Lipoma excision (1975); Knee arthroscopy (08/13/2011); Knee arthroscopy w/ meniscectomy (Right, 01-29-2011); Colonoscopy w/ polypectomy (LAST ONE 2013); Knee arthroscopy (Left, 11/16/2013); Coronary stent placement (05/17/2014); left heart catheterization with coronary angiogram (N/A, 05/17/2014); and percutaneous coronary stent intervention (pci-s) (05/17/2014).   His family history includes Cancer in his sister; Cancer (age of onset: 34) in his mother; Heart attack (age of onset: 35) in his paternal grandfather; Heart attack (age of onset: 8) in his paternal uncle; Heart attack (age of onset: 52) in his father; Heart disease in his father and maternal grandfather; Hypertension in his mother.He reports that he quit smoking about 53 years ago. His smoking use included cigarettes. He has a 4.00 pack-year smoking history. He has never used smokeless tobacco. He reports that he does not drink alcohol and does not use drugs.  Current Outpatient Medications on File Prior to Visit  Medication Sig Dispense Refill   acetaminophen (TYLENOL) 500 MG tablet Take 1,000 mg by mouth every 6 (six) hours as  needed for mild pain. Reported on 03/31/2015     aspirin EC 81 MG tablet Take 81 mg by mouth daily with breakfast.      cetirizine (ZYRTEC) 10 MG tablet TAKE 1 TABLET BY MOUTH EVERY MORNING 100 tablet 3   Cholecalciferol 50 MCG (2000 UT) TABS Take 2 tablets by mouth daily.     COVID-19 mRNA Vac-TriS, Pfizer, (PFIZER-BIONT COVID-19 VAC-TRIS) SUSP injection Inject into the muscle. 0.3  mL 0   glucagon (GLUCAGON EMERGENCY) 1 MG injection Inject 1 mg into the muscle once as needed for up to 1 dose. 1 each 12   glucose 4 GM chewable tablet Chew by mouth.     insulin NPH Human (HUMULIN N) 100 UNIT/ML injection INJECT UNDER SKIN 20 UNITS IN THE AM AND 40 UNITS AT BEDTIME. 30 mL 3   insulin regular (HUMULIN R) 100 units/mL injection Inject 0.16-0.24 mLs (16-24 Units total) into the skin 3 (three) times daily before meals. 30 mL 3   Insulin Syringe-Needle U-100 (TRUEPLUS INSULIN SYRINGE) 30G X 5/16" 0.5 ML MISC USE TO INJECT INSULIN 5 TIMES DAILY. 200 each 3   losartan (COZAAR) 100 MG tablet Take 1 tablet (100 mg total) by mouth daily. 90 tablet 0   metoprolol tartrate (LOPRESSOR) 25 MG tablet Take 1 tablet (25 mg total) by mouth 2 (two) times daily. 180 tablet 3   Multiple Vitamin (MULTIVITAMIN WITH MINERALS) TABS tablet Take 1 tablet by mouth daily.     nitroGLYCERIN (NITROSTAT) 0.4 MG SL tablet Place 1 tablet (0.4 mg total) under the tongue every 5 (five) minutes as needed for chest pain. 25 tablet 3   prasugrel (EFFIENT) 10 MG TABS tablet Take 1 tablet (10 mg total) by mouth daily. 90 tablet 3   simvastatin (ZOCOR) 40 MG tablet TAKE 1 TABLET (40 MG) BY MOUTH AT BEDTIME. 90 tablet 2   [DISCONTINUED] famotidine (PEPCID) 20 MG tablet Take 1 tablet (20 mg total) by mouth at bedtime. (Patient not taking: Reported on 03/17/2020) 30 tablet 3   No current facility-administered medications on file prior to visit.     Objective:  Objective  Physical Exam Constitutional:      General: He is not in acute distress.    Appearance: Normal appearance. He is not ill-appearing or toxic-appearing.  HENT:     Head: Normocephalic and atraumatic.     Right Ear: Tympanic membrane, ear canal and external ear normal.     Left Ear: Tympanic membrane, ear canal and external ear normal.     Nose: No congestion or rhinorrhea.  Eyes:     Extraocular Movements: Extraocular movements intact.     Pupils:  Pupils are equal, round, and reactive to light.  Cardiovascular:     Rate and Rhythm: Normal rate and regular rhythm.     Pulses: Normal pulses.     Heart sounds: Normal heart sounds. No murmur heard. Pulmonary:     Effort: Pulmonary effort is normal. No respiratory distress.     Breath sounds: Normal breath sounds. No wheezing, rhonchi or rales.  Abdominal:     General: Bowel sounds are normal.     Palpations: Abdomen is soft. There is no mass.     Tenderness: no abdominal tenderness There is no guarding.     Hernia: No hernia is present.  Musculoskeletal:        General: Normal range of motion.     Cervical back: Normal range of motion and neck supple.  Skin:  General: Skin is warm and dry.  Neurological:     Mental Status: He is alert and oriented to person, place, and time.  Psychiatric:        Behavior: Behavior normal.   BP 120/60 (BP Location: Left Arm, Cuff Size: Large)   Pulse (!) 50   Temp 97.8 F (36.6 C) (Oral)   Resp 12   Ht 5\' 11"  (1.803 m)   Wt 251 lb (113.9 kg)   SpO2 97%   BMI 35.01 kg/m  Wt Readings from Last 3 Encounters:  10/02/20 251 lb (113.9 kg)  09/24/20 250 lb 9.6 oz (113.7 kg)  05/22/20 261 lb 12.8 oz (118.8 kg)     Lab Results  Component Value Date   WBC 4.8 09/22/2020   HGB 14.4 09/22/2020   HCT 42.2 09/22/2020   PLT 145.0 (L) 09/22/2020   GLUCOSE 182 (H) 09/22/2020   CHOL 116 09/22/2020   TRIG 113.0 09/22/2020   HDL 32.90 (L) 09/22/2020   LDLDIRECT 108.0 04/16/2014   LDLCALC 61 09/22/2020   ALT 20 09/22/2020   AST 20 09/22/2020   NA 137 09/22/2020   K 4.5 09/22/2020   CL 103 09/22/2020   CREATININE 0.80 09/22/2020   BUN 14 09/22/2020   CO2 26 09/22/2020   TSH 2.63 09/22/2020   PSA 1.92 09/25/2018   INR 1.02 05/17/2014   HGBA1C 7.7 (H) 09/22/2020   MICROALBUR 1.7 06/24/2015    MM DIAG BREAST TOMO BILATERAL  Result Date: 04/04/2015 CLINICAL DATA:  Tender nodule under areola. EXAM: DIGITAL DIAGNOSTIC BILATERAL MAMMOGRAM  WITH 3D TOMOSYNTHESIS WITH CAD COMPARISON:  Previous exam(s). ACR Breast Density Category b: There are scattered areas of fibroglandular density. FINDINGS: Bilateral CC and MLO projections were obtained today with 3D tomosynthesis. Additional tangential view was obtained of the retroareolar right breast, corresponding to the area of clinical concern, with overlying skin marker in place. There is unilateral right gynecomastia, dendritic type, mild in degree. There are no dominant masses, suspicious calcifications or secondary signs of malignancy within either breast. Mammographic images were processed with CAD. IMPRESSION: Benign gynecomastia within the retroareolar right breast, corresponding to the area of clinical concern. No evidence of malignancy within either breast. RECOMMENDATION: Clinical follow-up as needed. I discussed with the patient the fact that gynecomastia can occur in older men as testosterone levels decrease with age or in younger men with low testosterone levels, causing a change in the serum testosterone:estrogen ratio. We also discussed other potential etiologies of gynecomastia including numerous prescription medications. I counseled the patient to perform self-examination to make sure that a hard lump does not form which could indicate malignancy and would need further evaluation. Surgical excision could be considered if symptoms continue and if an etiology of the gynecomastia cannot be determined and therefore corrected. Findings discussed with the ordering physician, Dr. Charlett Blake, during the exam (at 4 p.m. on 04/04/2015). Per this discussion, it was determined that ultrasound was not needed for further characterization. Results were also provided in writing to the patient at the conclusion of the visit. BI-RADS CATEGORY  2: Benign. Electronically Signed   By: Franki Cabot M.D.   On: 04/04/2015 18:11     Assessment & Plan:  Plan    Meds ordered this encounter  Medications   Accu-Chek  FastClix Lancets MISC    Sig: Use to test blood sugar 2 times daily. Dx: E11.40    Dispense:  204 each    Refill:  5    Problem List  Items Addressed This Visit     Hypertension (Chronic)    Well controlled, no changes to meds. Encouraged heart healthy diet such as the DASH diet and exercise as tolerated.        Hyperlipidemia    Encourage heart healthy diet such as MIND or DASH diet, increase exercise, avoid trans fats, simple carbohydrates and processed foods, consider a krill or fish or flaxseed oil cap daily.        Vitamin D deficiency    Supplement and monitor       Thrombocytopenia ([place])    Asymptomatic and will continue to monitor       Well controlled type 2 diabetes mellitus with peripheral neuropathy ([place]) - Primary    hgba1c acceptable mildly elevated, minimize simple carbs. Increase exercise as tolerated. He is following with endocrinology now and he now have a continuous glucose monitor. It is showing very labile blood sugars. He tries to eat small, frequent meals with protein and minimal carbs but he still drops quickly into the 20s. They are working to stabilize his numbers and monitoring him closely       Relevant Medications   Accu-Chek FastClix Lancets MISC    Follow-up: Return in about 3 months (around 01/02/2021).  I, Suezanne Jacquet, acting as a scribe for Penni Homans, MD, have documented all relevent documentation on behalf of Penni Homans, MD, as directed by Penni Homans, MD while in the presence of Penni Homans, MD.  I, Mosie Lukes, MD personally performed the services described in this documentation. All medical record entries made by the scribe were at my direction and in my presence. I have reviewed the chart and agree that the record reflects my personal performance and is accurate and complete

## 2020-10-02 NOTE — Patient Instructions (Addendum)
Collagen or Yellow Split Pea powder or Whey powder or hemp seed powder  Allergies, Adult An allergy is a condition in which the body's defense system (immune system) comes in contact with an allergen and reacts to it. An allergen is anything that causes an allergic reaction. Allergens cause the immune system to make proteins for fighting infections (antibodies). These antibodies cause cells to release chemicals called histamines that setoff the symptoms of an allergic reaction. Allergies often affect the nasal passages (allergic rhinitis), eyes (allergic conjunctivitis), skin (atopic dermatitis), and stomach. Allergies can be mild, moderate, or severe. They cannot spreadfrom person to person. Allergies can develop at any age and may be outgrown. What are the causes? This condition is caused by allergens. Common allergens include: Outdoor allergens, such as pollen, car fumes, and mold. Indoor allergens, such as dust, smoke, mold, and pet dander. Other allergens, such as foods, medicines, scents, insect bites or stings, and other skin irritants. What increases the risk? You are more likely to develop this condition if you have: Family members with allergies. Family members who have any condition that may be caused by allergens, such as asthma. This may make you more likely to have other allergies. What are the signs or symptoms? Symptoms of this condition depend on the severity of the allergy. Mild to moderate symptoms Runny nose, stuffy nose (nasal congestion), or sneezing. Itchy mouth, ears, or throat. A feeling of mucus dripping down the back of your throat (postnasal drip). Sore throat. Itchy, red, watery, or puffy eyes. Skin rash, or itchy, red, swollen areas of skin (hives). Stomach cramps or bloating. Severe symptoms Severe allergies to food, medicine, or insect bites may cause anaphylaxis, which can be life-threatening. Symptoms include: A red (flushed) face. Wheezing or  coughing. Swollen lips, tongue, or mouth. Tight or swollen throat. Chest pain or tightness, or rapid heartbeat. Trouble breathing or shortness of breath. Pain in the abdomen, vomiting, or diarrhea. Dizziness or fainting. How is this diagnosed? This condition is diagnosed based on your symptoms, your family and medical history, and a physical exam. You may also have tests, including: Skin tests to see how your skin reacts to allergens that may be causing your symptoms. Tests include: Skin prick test. For this test, an allergen is introduced to your body through a small opening in the skin. Intradermal skin test. For this test, a small amount of allergen is injected under the first layer of your skin. Patch test. For this test, a small amount of allergen is placed on your skin. The area is covered and then checked after a few days. Blood tests. A challenge test. For this test, you will eat or breathe in a small amount of allergen to see if you have an allergic reaction. You may also be asked to: Keep a food diary. This is a record of all the foods, drinks, and symptoms you have in a day. Try an elimination diet. To do this: Remove certain foods from your diet. Add those foods back one by one to find out if any foods cause an allergic reaction. How is this treated?     Treatment for allergies depends on your symptoms. Treatment may include: Cold, wet cloths (cold compresses) to soothe itching and swelling. Eye drops or nasal sprays. Nasal irrigation to help clear your mucus or keep the nasal passages moist. A humidifier to add moisture to the air. Skin creams to treat rashes or itching. Oral antihistamines or other medicines to block the reaction or  to treat inflammation. Diet changes to remove foods that cause allergies. Being exposed again and again to tiny amounts of allergens to help you build a defense against it (tolerance). This is called immunotherapy. Examples include: Allergy  shot. You receive an injection that contains an allergen. Sublingual immunotherapy. You take a small dose of allergen under your tongue. Emergency injection for anaphylaxis. You give yourself a shot using a syringe (auto-injector) that contains the amount of medicine you need. Your health care provider will teach you how to give yourself an injection. Follow these instructions at home: Medicines  Take or apply over-the-counter and prescription medicines only as told by your health care provider. Always carry your auto-injector pen if you are at risk of anaphylaxis. Give yourself an injection as told by your health care provider.  Eating and drinking Follow instructions from your health care provider about eating or drinking restrictions. Drink enough fluid to keep your urine pale yellow. General instructions Wear a medical alert bracelet or necklace to let others know that you have had anaphylaxis before. Avoid known allergens whenever possible. Keep all follow-up visits as told by your health care provider. This is important. Contact a health care provider if: Your symptoms do not get better with treatment. Get help right away if: You have symptoms of anaphylaxis. These include: Swollen mouth, tongue, or throat. Pain or tightness in your chest. Trouble breathing or shortness of breath. Dizziness or fainting. Severe abdominal pain, vomiting, or diarrhea. These symptoms may represent a serious problem that is an emergency. Do not wait to see if the symptoms will go away. Get medical help right away. Call your local emergency services (911 in the [place] Do not drive yourself to the hospital. Summary Take or apply over-the-counter and prescription medicines only as told by your health care provider. Avoid known allergens when possible. Always carry your auto-injector pen if you are at risk of anaphylaxis. Give yourself an injection as told by your health care provider. Wear a medical alert  bracelet or necklace to let others know that you have had anaphylaxis before. Anaphylaxis is a life-threatening emergency. Get help right away. This information is not intended to replace advice given to you by your health care provider. Make sure you discuss any questions you have with your healthcare provider. Document Revised: 10/29/2019 Document Reviewed: 01/10/2019 Elsevier Patient Education  2022 [employer].

## 2020-10-05 NOTE — Assessment & Plan Note (Signed)
Encourage heart healthy diet such as MIND or DASH diet, increase exercise, avoid trans fats, simple carbohydrates and processed foods, consider a krill or fish or flaxseed oil cap daily.  °

## 2020-10-05 NOTE — Assessment & Plan Note (Signed)
hgba1c acceptable mildly elevated, minimize simple carbs. Increase exercise as tolerated. He is following with endocrinology now and he now have a continuous glucose monitor. It is showing very labile blood sugars. He tries to eat small, frequent meals with protein and minimal carbs but he still drops quickly into the 20s. They are working to stabilize his numbers and monitoring him closely

## 2020-10-05 NOTE — Assessment & Plan Note (Signed)
Supplement and monitor 

## 2020-10-05 NOTE — Assessment & Plan Note (Signed)
Well controlled, no changes to meds. Encouraged heart healthy diet such as the DASH diet and exercise as tolerated.  °

## 2020-10-05 NOTE — Assessment & Plan Note (Signed)
Asymptomatic and will continue to monitor

## 2020-10-09 ENCOUNTER — Ambulatory Visit: Payer: Medicare HMO | Admitting: Pulmonary Disease

## 2020-10-14 ENCOUNTER — Telehealth: Payer: Self-pay | Admitting: Family Medicine

## 2020-10-14 ENCOUNTER — Ambulatory Visit: Payer: Medicare HMO | Admitting: Podiatry

## 2020-10-14 ENCOUNTER — Other Ambulatory Visit: Payer: Self-pay

## 2020-10-14 ENCOUNTER — Encounter: Payer: Self-pay | Admitting: Podiatry

## 2020-10-14 DIAGNOSIS — M79676 Pain in unspecified toe(s): Secondary | ICD-10-CM

## 2020-10-14 DIAGNOSIS — B351 Tinea unguium: Secondary | ICD-10-CM

## 2020-10-14 DIAGNOSIS — E1149 Type 2 diabetes mellitus with other diabetic neurological complication: Secondary | ICD-10-CM | POA: Diagnosis not present

## 2020-10-14 DIAGNOSIS — D689 Coagulation defect, unspecified: Secondary | ICD-10-CM | POA: Diagnosis not present

## 2020-10-14 NOTE — Telephone Encounter (Signed)
Copied from [place] [id number]. Topic: Medicare AWV >> Oct 14, 2020 10:45 AM Harris-Coley, Hannah Beat wrote: Reason for CRM: Left message for patient to schedule Annual Wellness Visit.  Please schedule with Health Nurse Advisor Augustine Radar. at [facility].

## 2020-10-15 ENCOUNTER — Other Ambulatory Visit (HOSPITAL_BASED_OUTPATIENT_CLINIC_OR_DEPARTMENT_OTHER): Payer: Self-pay

## 2020-10-15 MED FILL — Cetirizine HCl Tab 10 MG: ORAL | 100 days supply | Qty: 100 | Fill #1 | Status: AC

## 2020-10-20 NOTE — Progress Notes (Signed)
Subjective: 75 y.o. returns the office today for painful, elongated, thickened toenails which he cannot trim himself. Denies any redness or drainage around the nails. Denies any open sores or pustules.  He has no new concerns. Denies any systemic complaints such as fevers, chills, nausea, vomiting.   PCP: Mosie Lukes, MD - Last seen 10/02/2020 Endocrinologist: Dr. Benjiman Core, MD   Last A1c was 7.7 on 09/24/2020  Objective: AAO 3, NAD DP/PT pulses palpable, CRT less than 3 seconds Sensation is mildly decreased with SWM but overall no change.  Neuropathy symptoms are unchanged. Nails hypertrophic, dystrophic, elongated, brittle, discolored 10. There is tenderness overlying the nails 1-5 bilaterally. There is no surrounding erythema or drainage along the nail sites. No pain with calf compression, swelling, warmth, erythema.  Assessment: Patient presents with symptomatic onychomycosis; likely tinea pedis  Plan: -Treatment options including alternatives, risks, complications were discussed -Nails sharply debrided 10 without complication/bleeding. -Continue to monitor neuropathy-since the symptoms are stable. -Discussed daily foot inspection. If there are any changes, to call the office immediately.  -Follow-up in 3 months or sooner if any problems are to arise. In the meantime, encouraged to call the office with any questions, concerns, changes symptoms.  Celesta Gentile, DPM

## 2020-11-05 ENCOUNTER — Encounter: Payer: Self-pay | Admitting: Cardiology

## 2020-11-10 DIAGNOSIS — H5213 Myopia, bilateral: Secondary | ICD-10-CM | POA: Diagnosis not present

## 2020-11-10 DIAGNOSIS — H2513 Age-related nuclear cataract, bilateral: Secondary | ICD-10-CM | POA: Diagnosis not present

## 2020-11-10 DIAGNOSIS — E119 Type 2 diabetes mellitus without complications: Secondary | ICD-10-CM | POA: Diagnosis not present

## 2020-11-10 LAB — HM DIABETES EYE EXAM

## 2020-11-11 ENCOUNTER — Other Ambulatory Visit: Payer: Self-pay

## 2020-11-11 ENCOUNTER — Encounter: Payer: Self-pay | Admitting: Pulmonary Disease

## 2020-11-11 ENCOUNTER — Ambulatory Visit: Payer: Medicare HMO | Admitting: Pulmonary Disease

## 2020-11-11 VITALS — BP 118/56 | HR 55 | Temp 97.5°F | Ht 69.5 in | Wt 251.4 lb

## 2020-11-11 DIAGNOSIS — G4733 Obstructive sleep apnea (adult) (pediatric): Secondary | ICD-10-CM | POA: Diagnosis not present

## 2020-11-11 DIAGNOSIS — Z9989 Dependence on other enabling machines and devices: Secondary | ICD-10-CM

## 2020-11-11 NOTE — Patient Instructions (Signed)
No changes to your CPAP at present  Continue to use CPAP on a nightly basis  Weight loss efforts as tolerated  I will see you in 6 months

## 2020-11-11 NOTE — Progress Notes (Signed)
Subjective:    Patient ID: Michael Gavel., male    DOB: [birth year], 75 y.o.   MRN: [id number]  Patient with obstructive sleep apnea diagnosed in 2017  Has been using CPAP since 2017 He continues to notice significant improvement in symptoms with CPAP use  Currently on CPAP of 12 Did have difficulty with CPAP of 14 during titration  Spouse reports some snoring with the machine in place  Patient describes significant improvement and maintenance of improvement in symptoms with CPAP use  Usually goes to bed about 10 PM 20 to 30 minutes sleep onset About 2 awakenings Final awakening time about 6-8 a.m.  Comorbidities include hypertension, coronary artery disease, diabetes, hypercholesterolemia  Sleeps well Functions well  Very active, 30 minutes of exercise regularly  Past Medical History:  Diagnosis Date   CAD (coronary artery disease)    a. 05/2014 [place] s/p DES x4 to mid-distLAD and DESx1 to DI   Complication of anesthesia    11'12 Surgery with excrutiating "head about to explode" upon awakening  -lasted several hours postop  ( SURGERY DONE 08-13-2011 AT [facility] PT DID OK POST OP)   Diverticulosis of colon    LEFT SIDE   Exposure to Agent [facility] 10/18/2016   [place]   History of small bowel obstruction    X2  28 YRS AGO AND LAST ONE 04/2014--  RESOLVED WITHOUT SURGICAL INTERVENTION   Hyperlipidemia    Hypertension    Knee pain, bilateral 08/13/2011   Left ureteral calculus    Multiple lipomas    hx. mulitple and some remains -arms, legs   Obesity, unspecified 09/18/2013   Obstructive sleep apnea 01/01/2016   Recurrent kidney stones 07/08/2013   Follows with Dr Gaynelle Arabian He believes they are Calcium oxalate stones   Thrombocytopenia ([place]) 07/07/2014   Type 2 diabetes mellitus ([place])    Vitamin D deficiency 07/02/2014   Social History   Socioeconomic History   Marital status: Married    Spouse name: Not on file   Number of children: 2   Years of education: Not on file    Highest education level: Not on file  Occupational History   Not on file  Tobacco Use   Smoking status: Former    Packs/day: 1.00    Years: 4.00    Pack years: 4.00    Types: Cigarettes    Quit date: 06/14/1967    Years since quitting: 53.4   Smokeless tobacco: Never  Substance and Sexual Activity   Alcohol use: No    Alcohol/week: 0.0 standard drinks   Drug use: No   Sexual activity: Yes    Comment: lives with wife  Other Topics Concern   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     Social Determinants of Health   Financial Resource Strain: Not on file  Food Insecurity: Not on file  Transportation Needs: Not on file  Physical Activity: Not on file  Stress: Not on file  Social Connections: Not on file  Intimate Partner Violence: Not on file   Family History  Problem Relation Age of Onset   Hypertension Mother    Cancer Mother 59       ovarian   Heart disease Father        CHF   Heart attack Father 17   Cancer Sister        breast- just finished her last radiation   Heart disease Maternal Grandfather        ?  Heart attack Paternal Grandfather 27   Heart attack Paternal Uncle 33       Sudden death   Diabetes Neg Hx    Review of Systems  Constitutional:  Negative for fever and unexpected weight change.  HENT:  Negative for congestion, dental problem, ear pain, nosebleeds, postnasal drip, rhinorrhea, sinus pressure, sneezing, sore throat and trouble swallowing.   Eyes:  Negative for redness and itching.  Respiratory:  Negative for cough, chest tightness, shortness of breath and wheezing.   Cardiovascular:  Negative for palpitations and leg swelling.  Gastrointestinal:  Negative for nausea and vomiting.  Genitourinary:  Negative for dysuria.  Musculoskeletal:  Negative for joint swelling.  Skin:  Negative for rash.  Allergic/Immunologic: Negative.  Negative for environmental allergies, food allergies and  immunocompromised state.  Neurological:  Negative for headaches.  Hematological:  Does not bruise/bleed easily.  Psychiatric/Behavioral:  Negative for dysphoric mood. The patient is not nervous/anxious.      Objective:   Physical Exam Constitutional:      Appearance: He is obese.  HENT:     Head: Normocephalic.     Nose: No congestion.     Mouth/Throat:     Comments: Mallampati 3, crowded oropharynx Eyes:     Extraocular Movements: Extraocular movements intact.     Pupils: Pupils are equal, round, and reactive to light.  Cardiovascular:     Rate and Rhythm: Normal rate and regular rhythm.     Heart sounds: No murmur heard.   No friction rub.  Pulmonary:     Effort: Pulmonary effort is normal. No respiratory distress.     Breath sounds: Normal breath sounds. No stridor. No wheezing or rhonchi.  Musculoskeletal:     Cervical back: No rigidity or tenderness. No muscular tenderness.  Neurological:     Mental Status: He is alert.  Psychiatric:        Mood and Affect: Mood normal.   Vitals:   11/11/20 1400  BP: (!) 118/56  Pulse: (!) 55  Temp: (!) 97.5 F (36.4 C)  SpO2: 96%   Results of the Epworth flowsheet 02/15/2019  Sitting and reading 1  Watching TV 1  Sitting, inactive in a public place (e.g. a theatre or a meeting) 0  As a passenger in a car for an hour without a break 0  Lying down to rest in the afternoon when circumstances permit 0  Sitting and talking to someone 0  Sitting quietly after a lunch without alcohol 0  In a car, while stopped for a few minutes in traffic 0  Total score 2   Download reviewed 97% compliance CPAP of 12 AHI of 2.7     Assessment & Plan:  .  Obstructive sleep apnea -Severe obstructive sleep apnea adequately treated on CPAP therapy -Maintain improvement in symptoms  Occasional snoring -We will continue CPAP at current levels  .  Obesity -Encouraged weight loss measures  Coronary artery disease Status post stenting in the  past -Stable  Plan: Continue CPAP therapy  No pressure changes  I will see him about 6 months from now  Encouraged to call with any significant concerns

## 2020-11-14 ENCOUNTER — Other Ambulatory Visit (HOSPITAL_BASED_OUTPATIENT_CLINIC_OR_DEPARTMENT_OTHER): Payer: Self-pay

## 2020-12-09 ENCOUNTER — Encounter: Payer: Self-pay | Admitting: Family Medicine

## 2020-12-10 ENCOUNTER — Other Ambulatory Visit: Payer: Self-pay

## 2020-12-10 DIAGNOSIS — I10 Essential (primary) hypertension: Secondary | ICD-10-CM

## 2020-12-10 DIAGNOSIS — E559 Vitamin D deficiency, unspecified: Secondary | ICD-10-CM

## 2020-12-10 DIAGNOSIS — E782 Mixed hyperlipidemia: Secondary | ICD-10-CM

## 2020-12-10 NOTE — Telephone Encounter (Signed)
Scheduled and orders placed

## 2020-12-23 DIAGNOSIS — G4733 Obstructive sleep apnea (adult) (pediatric): Secondary | ICD-10-CM | POA: Diagnosis not present

## 2020-12-29 ENCOUNTER — Ambulatory Visit: Payer: Medicare HMO | Admitting: Internal Medicine

## 2021-01-01 ENCOUNTER — Other Ambulatory Visit (INDEPENDENT_AMBULATORY_CARE_PROVIDER_SITE_OTHER): Payer: Medicare HMO

## 2021-01-01 ENCOUNTER — Other Ambulatory Visit: Payer: Self-pay

## 2021-01-01 DIAGNOSIS — I10 Essential (primary) hypertension: Secondary | ICD-10-CM | POA: Diagnosis not present

## 2021-01-01 DIAGNOSIS — E559 Vitamin D deficiency, unspecified: Secondary | ICD-10-CM

## 2021-01-01 DIAGNOSIS — E782 Mixed hyperlipidemia: Secondary | ICD-10-CM

## 2021-01-01 LAB — COMPREHENSIVE METABOLIC PANEL
ALT: 22 U/L (ref 0–53)
AST: 19 U/L (ref 0–37)
Albumin: 4.3 g/dL (ref 3.5–5.2)
Alkaline Phosphatase: 86 U/L (ref 39–117)
BUN: 14 mg/dL (ref 6–23)
CO2: 29 mEq/L (ref 19–32)
Calcium: 9.1 mg/dL (ref 8.4–10.5)
Chloride: 102 mEq/L (ref 96–112)
Creatinine, Ser: 0.88 mg/dL (ref 0.40–1.50)
GFR: 83.53 mL/min (ref >60.00)
Glucose, Bld: 193 mg/dL — ABNORMAL HIGH (ref 70–99)
Potassium: 4.3 mEq/L (ref 3.5–5.1)
Sodium: 138 mEq/L (ref 135–145)
Total Bilirubin: 1.1 mg/dL (ref 0.2–1.2)
Total Protein: 7 g/dL (ref 6.0–8.3)

## 2021-01-01 LAB — CBC WITH DIFFERENTIAL/PLATELET
Basophils Absolute: 0 10*3/uL (ref 0.0–0.1)
Basophils Relative: 0.7 % (ref 0.0–3.0)
Eosinophils Absolute: 0.2 10*3/uL (ref 0.0–0.7)
Eosinophils Relative: 3.2 % (ref 0.0–5.0)
HCT: 42.5 % (ref 39.0–52.0)
Hemoglobin: 14.3 g/dL (ref 13.0–17.0)
Lymphocytes Relative: 37.3 % (ref 12.0–46.0)
Lymphs Abs: 2 10*3/uL (ref 0.7–4.0)
MCHC: 33.7 g/dL (ref 30.0–36.0)
MCV: 93 fl (ref 78.0–100.0)
Monocytes Absolute: 0.4 10*3/uL (ref 0.1–1.0)
Monocytes Relative: 7.2 % (ref 3.0–12.0)
Neutro Abs: 2.8 10*3/uL (ref 1.4–7.7)
Neutrophils Relative %: 51.6 % (ref 43.0–77.0)
Platelets: 141 10*3/uL — ABNORMAL LOW (ref 150.0–400.0)
RBC: 4.57 Mil/uL (ref 4.22–5.81)
RDW: 13.8 % (ref 11.5–15.5)
WBC: 5.4 10*3/uL (ref 4.0–10.5)

## 2021-01-01 LAB — LIPID PANEL
Cholesterol: 126 mg/dL (ref 0–200)
HDL: 34.3 mg/dL — ABNORMAL LOW (ref >39.00)
LDL Cholesterol: 65 mg/dL (ref 0–99)
NonHDL: 91.36
Total CHOL/HDL Ratio: 4
Triglycerides: 131 mg/dL (ref 0.0–149.0)
VLDL: 26.2 mg/dL (ref 0.0–40.0)

## 2021-01-01 LAB — HEMOGLOBIN A1C: Hgb A1c MFr Bld: 7.5 % — ABNORMAL HIGH (ref 4.6–6.5)

## 2021-01-01 LAB — TSH: TSH: 3.16 u[IU]/mL (ref 0.35–5.50)

## 2021-01-07 LAB — VITAMIN D 1,25 DIHYDROXY
Vitamin D 1, 25 (OH)2 Total: 30 pg/mL (ref 18–72)
Vitamin D2 1, 25 (OH)2: <8 pg/mL
Vitamin D3 1, 25 (OH)2: 30 pg/mL

## 2021-01-08 ENCOUNTER — Ambulatory Visit (INDEPENDENT_AMBULATORY_CARE_PROVIDER_SITE_OTHER): Payer: Medicare HMO | Admitting: Family Medicine

## 2021-01-08 ENCOUNTER — Encounter: Payer: Self-pay | Admitting: Family Medicine

## 2021-01-08 ENCOUNTER — Other Ambulatory Visit: Payer: Self-pay

## 2021-01-08 DIAGNOSIS — E782 Mixed hyperlipidemia: Secondary | ICD-10-CM

## 2021-01-08 DIAGNOSIS — I10 Essential (primary) hypertension: Secondary | ICD-10-CM

## 2021-01-08 DIAGNOSIS — E559 Vitamin D deficiency, unspecified: Secondary | ICD-10-CM

## 2021-01-08 DIAGNOSIS — E1142 Type 2 diabetes mellitus with diabetic polyneuropathy: Secondary | ICD-10-CM | POA: Diagnosis not present

## 2021-01-08 DIAGNOSIS — E669 Obesity, unspecified: Secondary | ICD-10-CM | POA: Diagnosis not present

## 2021-01-08 MED ORDER — INSULIN GLARGINE-YFGN 100 UNIT/ML ~~LOC~~ SOPN: 40.0000 [IU] | PEN_INJECTOR | Freq: Every day | SUBCUTANEOUS | Status: DC

## 2021-01-08 NOTE — Assessment & Plan Note (Signed)
Supplement and monitor 

## 2021-01-08 NOTE — Patient Instructions (Signed)
Carbohydrate Counting for Diabetes Mellitus, Adult Carbohydrate counting is a method of keeping track of how many carbohydrates you eat. Eating carbohydrates naturally increases the amount of sugar (glucose) in the blood. Counting how many carbohydrates you eat improves your blood glucose control, which helps you manage your diabetes. It is important to know how many carbohydrates you can safely have in each meal. This is different for every person. A dietitian can help you make a meal plan and calculate how many carbohydrates you should have at each meal and snack. What foods contain carbohydrates? Carbohydrates are found in the following foods: Grains, such as breads and cereals. Dried beans and soy products. Starchy vegetables, such as potatoes, peas, and corn. Fruit and fruit juices. Milk and yogurt. Sweets and snack foods, such as cake, cookies, candy, chips, and soft drinks. How do I count carbohydrates in foods? There are two ways to count carbohydrates in food. You can read food labels or learn standard serving sizes of foods. You can use either of the methods or a combination of both. Using the Nutrition Facts label The Nutrition Facts list is included on the labels of almost all packaged foods and beverages in the [place] It includes: The serving size. Information about nutrients in each serving, including the grams (g) of carbohydrate per serving. To use the Nutrition Facts: Decide how many servings you will have. Multiply the number of servings by the number of carbohydrates per serving. The resulting number is the total amount of carbohydrates that you will be having. Learning the standard serving sizes of foods When you eat carbohydrate foods that are not packaged or do not include Nutrition Facts on the label, you need to measure the servings in order to count the amount of carbohydrates. Measure the foods that you will eat with a food scale or measuring cup, if needed. Decide how  many standard-size servings you will eat. Multiply the number of servings by 15. For foods that contain carbohydrates, one serving equals 15 g of carbohydrates. For example, if you eat 2 cups or 10 oz (300 g) of strawberries, you will have eaten 2 servings and 30 g of carbohydrates (2 servings x 15 g = 30 g). For foods that have more than one food mixed, such as soups and casseroles, you must count the carbohydrates in each food that is included. The following list contains standard serving sizes of common carbohydrate-rich foods. Each of these servings has about 15 g of carbohydrates: 1 slice of bread. 1 six-inch (15 cm) tortilla. ? cup or 2 oz (53 g) cooked rice or pasta.  cup or 3 oz (85 g) cooked or canned, drained and rinsed beans or lentils.  cup or 3 oz (85 g) starchy vegetable, such as peas, corn, or squash.  cup or 4 oz (120 g) hot cereal.  cup or 3 oz (85 g) boiled or mashed potatoes, or  or 3 oz (85 g) of a large baked potato.  cup or 4 fl oz (118 mL) fruit juice. 1 cup or 8 fl oz (237 mL) milk. 1 small or 4 oz (106 g) apple.  or 2 oz (63 g) of a medium banana. 1 cup or 5 oz (150 g) strawberries. 3 cups or 1 oz (24 g) popped popcorn. What is an example of carbohydrate counting? To calculate the number of carbohydrates in this sample meal, follow the steps shown below. Sample meal 3 oz (85 g) chicken breast. ? cup or 4 oz (106 g) brown   rice.  cup or 3 oz (85 g) corn. 1 cup or 8 fl oz (237 mL) milk. 1 cup or 5 oz (150 g) strawberries with sugar-free whipped topping. Carbohydrate calculation Identify the foods that contain carbohydrates: Rice. Corn. Milk. Strawberries. Calculate how many servings you have of each food: 2 servings rice. 1 serving corn. 1 serving milk. 1 serving strawberries. Multiply each number of servings by 15 g: 2 servings rice x 15 g = 30 g. 1 serving corn x 15 g = 15 g. 1 serving milk x 15 g = 15 g. 1 serving strawberries x 15 g = 15  g. Add together all of the amounts to find the total grams of carbohydrates eaten: 30 g + 15 g + 15 g + 15 g = 75 g of carbohydrates total. What are tips for following this plan? Shopping Develop a meal plan and then make a shopping list. Buy fresh and frozen vegetables, fresh and frozen fruit, dairy, eggs, beans, lentils, and whole grains. Look at food labels. Choose foods that have more fiber and less sugar. Avoid processed foods and foods with added sugars. Meal planning Aim to have the same amount of carbohydrates at each meal and for each snack time. Plan to have regular, balanced meals and snacks. Where to find more information American Diabetes Association: www.diabetes.org Centers for Disease Control and Prevention: www.cdc.gov Summary Carbohydrate counting is a method of keeping track of how many carbohydrates you eat. Eating carbohydrates naturally increases the amount of sugar (glucose) in the blood. Counting how many carbohydrates you eat improves your blood glucose control, which helps you manage your diabetes. A dietitian can help you make a meal plan and calculate how many carbohydrates you should have at each meal and snack. This information is not intended to replace advice given to you by your health care provider. Make sure you discuss any questions you have with your health care provider. Document Revised: 03/01/2019 Document Reviewed: 03/02/2019 Elsevier Patient Education  2021 Elsevier Inc.  

## 2021-01-08 NOTE — Progress Notes (Signed)
Patient ID: Michael Cunningham., male    DOB: May 06, 1945  Age: 75 y.o. MRN: [id number]    Subjective:   Chief Complaint  Patient presents with   Follow-up   Subjective   HPI Michael Ensign Evinger Jr. presents for office visit today for follow up on HTN and type 2 diabetes. Last time he saw endocrinology at [facility] 2 months ago and since then his glucose readings have steadily been climbing up. At the moment, his is glucose readings average 110-180. He is scheduled for diet counseling. Denies CP/palp/SOB/HA/congestion/fevers/GI or GU c/o. Taking meds as prescribed.   Review of Systems  Constitutional:  Negative for chills, fatigue and fever.  HENT:  Negative for congestion, rhinorrhea, sinus pressure, sinus pain and sore throat.   Eyes:  Negative for pain.  Respiratory:  Negative for cough and shortness of breath.   Cardiovascular:  Negative for chest pain, palpitations and leg swelling.  Gastrointestinal:  Negative for abdominal pain, blood in stool, diarrhea, nausea and vomiting.  Genitourinary:  Negative for flank pain, frequency and penile pain.  Musculoskeletal:  Negative for back pain.  Neurological:  Negative for headaches.   History Past Medical History:  Diagnosis Date   CAD (coronary artery disease)    a. 05/2014 [place] s/p DES x4 to mid-distLAD and DESx1 to DI   Complication of anesthesia    11'12 Surgery with excrutiating "head about to explode" upon awakening  -lasted several hours postop  ( SURGERY DONE 08-13-2011 AT [facility] PT DID OK POST OP)   Diverticulosis of colon    LEFT SIDE   Exposure to Agent [facility] 10/18/2016   [place]   History of small bowel obstruction    X2  28 YRS AGO AND LAST ONE 04/2014--  RESOLVED WITHOUT SURGICAL INTERVENTION   Hyperlipidemia    Hypertension    Knee pain, bilateral 08/13/2011   Left ureteral calculus    Multiple lipomas    hx. mulitple and some remains -arms, legs   Obesity, unspecified 09/18/2013   Obstructive sleep apnea 01/01/2016   Recurrent  kidney stones 07/08/2013   Follows with Dr Gaynelle Arabian He believes they are Calcium oxalate stones   Thrombocytopenia ([place]) 07/07/2014   Type 2 diabetes mellitus ([place])    Vitamin D deficiency 07/02/2014    He has a past surgical history that includes Lipoma excision (1975); Knee arthroscopy (08/13/2011); Knee arthroscopy w/ meniscectomy (Right, 01-29-2011); Colonoscopy w/ polypectomy (LAST ONE 2013); Knee arthroscopy (Left, 11/16/2013); Coronary stent placement (05/17/2014); left heart catheterization with coronary angiogram (N/A, 05/17/2014); and percutaneous coronary stent intervention (pci-s) (05/17/2014).   His family history includes Cancer in his sister; Cancer (age of onset: 2) in his mother; Heart attack (age of onset: 2) in his paternal grandfather; Heart attack (age of onset: 107) in his paternal uncle; Heart attack (age of onset: 34) in his father; Heart disease in his father and maternal grandfather; Hypertension in his mother.He reports that he quit smoking about 53 years ago. His smoking use included cigarettes. He has a 4.00 pack-year smoking history. He has never used smokeless tobacco. He reports that he does not drink alcohol and does not use drugs.  Current Outpatient Medications on File Prior to Visit  Medication Sig Dispense Refill   Accu-Chek FastClix Lancets MISC Use to test blood sugar 2 times daily. Dx: E11.40 204 each 5   acetaminophen (TYLENOL) 500 MG tablet Take 1,000 mg by mouth every 6 (six) hours as needed for mild pain. Reported on 03/31/2015  aspirin EC 81 MG tablet Take 81 mg by mouth daily with breakfast.      cetirizine (ZYRTEC) 10 MG tablet TAKE 1 TABLET BY MOUTH EVERY MORNING 100 tablet 3   Cholecalciferol 50 MCG (2000 UT) TABS Take 2 tablets by mouth daily.     glucagon (GLUCAGON EMERGENCY) 1 MG injection Inject 1 mg into the muscle once as needed for up to 1 dose. 1 each 12   glucose-Vitamin C 4-0.006 GM CHEW chewable tablet CHEW FOUR TABLETS BY MOUTH  AS NEEDED  (REPEAT EVERY 15 MINUTES IF BLOOD SUGAR LESS THAN 70)     insulin aspart (NOVOLOG) 100 UNIT/ML FlexPen INJECT 15 UNITS SUBCUTANEOUSLY THREE TIMES A DAY BEFORE MEALS     insulin NPH Human (HUMULIN N) 100 UNIT/ML injection INJECT UNDER SKIN 20 UNITS IN THE AM AND 40 UNITS AT BEDTIME. 30 mL 3   Insulin Syringe-Needle U-100 (TRUEPLUS INSULIN SYRINGE) 30G X 5/16" 0.5 ML MISC USE TO INJECT INSULIN 5 TIMES DAILY. 200 each 3   losartan (COZAAR) 100 MG tablet Take 1 tablet (100 mg total) by mouth daily. (Patient taking differently: Take 75 mg by mouth daily.) 90 tablet 0   metoprolol tartrate (LOPRESSOR) 25 MG tablet Take 1 tablet (25 mg total) by mouth 2 (two) times daily. 180 tablet 3   Multiple Vitamin (MULTIVITAMIN WITH MINERALS) TABS tablet Take 1 tablet by mouth daily.     nitroGLYCERIN (NITROSTAT) 0.4 MG SL tablet Place 1 tablet (0.4 mg total) under the tongue every 5 (five) minutes as needed for chest pain. 25 tablet 3   prasugrel (EFFIENT) 10 MG TABS tablet Take 1 tablet (10 mg total) by mouth daily. 90 tablet 3   simvastatin (ZOCOR) 40 MG tablet TAKE 1 TABLET (40 MG) BY MOUTH AT BEDTIME. 90 tablet 2   COVID-19 mRNA Vac-TriS, Pfizer, (PFIZER-BIONT COVID-19 VAC-TRIS) SUSP injection Inject into the muscle. 0.3 mL 0   [DISCONTINUED] famotidine (PEPCID) 20 MG tablet Take 1 tablet (20 mg total) by mouth at bedtime. (Patient not taking: Reported on 03/17/2020) 30 tablet 3   No current facility-administered medications on file prior to visit.     Objective:  Objective  Physical Exam Constitutional:      General: He is not in acute distress.    Appearance: Normal appearance. He is not ill-appearing or toxic-appearing.  HENT:     Head: Normocephalic and atraumatic.     Right Ear: Tympanic membrane, ear canal and external ear normal.     Left Ear: Tympanic membrane, ear canal and external ear normal.     Nose: No congestion or rhinorrhea.  Eyes:     Extraocular Movements: Extraocular movements  intact.     Pupils: Pupils are equal, round, and reactive to light.  Cardiovascular:     Rate and Rhythm: Normal rate and regular rhythm.     Pulses: Normal pulses.     Heart sounds: Normal heart sounds. No murmur heard. Pulmonary:     Effort: Pulmonary effort is normal. No respiratory distress.     Breath sounds: Normal breath sounds. No wheezing, rhonchi or rales.  Abdominal:     General: Bowel sounds are normal.     Palpations: Abdomen is soft. There is no mass.     Tenderness: There is no abdominal tenderness. There is no guarding.     Hernia: No hernia is present.  Musculoskeletal:        General: Normal range of motion.     Cervical back: Normal  range of motion and neck supple.  Skin:    General: Skin is warm and dry.  Neurological:     Mental Status: He is alert and oriented to person, place, and time.  Psychiatric:        Behavior: Behavior normal.   BP 116/64   Pulse 68   Temp 97.6 F (36.4 C)   Resp 16   Wt 248 lb 9.6 oz (112.8 kg)   SpO2 97%   BMI 36.19 kg/m  Wt Readings from Last 3 Encounters:  01/08/21 248 lb 9.6 oz (112.8 kg)  11/11/20 251 lb 6.4 oz (114 kg)  10/02/20 251 lb (113.9 kg)     Lab Results  Component Value Date   WBC 5.4 01/01/2021   HGB 14.3 01/01/2021   HCT 42.5 01/01/2021   PLT 141.0 (L) 01/01/2021   GLUCOSE 193 (H) 01/01/2021   CHOL 126 01/01/2021   TRIG 131.0 01/01/2021   HDL 34.30 (L) 01/01/2021   LDLDIRECT 108.0 04/16/2014   LDLCALC 65 01/01/2021   ALT 22 01/01/2021   AST 19 01/01/2021   NA 138 01/01/2021   K 4.3 01/01/2021   CL 102 01/01/2021   CREATININE 0.88 01/01/2021   BUN 14 01/01/2021   CO2 29 01/01/2021   TSH 3.16 01/01/2021   PSA 1.92 09/25/2018   INR 1.02 05/17/2014   HGBA1C 7.5 (H) 01/01/2021   MICROALBUR 1.7 06/24/2015    MM DIAG BREAST TOMO BILATERAL  Result Date: 04/04/2015 CLINICAL DATA:  Tender nodule under areola. EXAM: DIGITAL DIAGNOSTIC BILATERAL MAMMOGRAM WITH 3D TOMOSYNTHESIS WITH CAD  COMPARISON:  Previous exam(s). ACR Breast Density Category b: There are scattered areas of fibroglandular density. FINDINGS: Bilateral CC and MLO projections were obtained today with 3D tomosynthesis. Additional tangential view was obtained of the retroareolar right breast, corresponding to the area of clinical concern, with overlying skin marker in place. There is unilateral right gynecomastia, dendritic type, mild in degree. There are no dominant masses, suspicious calcifications or secondary signs of malignancy within either breast. Mammographic images were processed with CAD. IMPRESSION: Benign gynecomastia within the retroareolar right breast, corresponding to the area of clinical concern. No evidence of malignancy within either breast. RECOMMENDATION: Clinical follow-up as needed. I discussed with the patient the fact that gynecomastia can occur in older men as testosterone levels decrease with age or in younger men with low testosterone levels, causing a change in the serum testosterone:estrogen ratio. We also discussed other potential etiologies of gynecomastia including numerous prescription medications. I counseled the patient to perform self-examination to make sure that a hard lump does not form which could indicate malignancy and would need further evaluation. Surgical excision could be considered if symptoms continue and if an etiology of the gynecomastia cannot be determined and therefore corrected. Findings discussed with the ordering physician, Dr. Charlett Blake, during the exam (at 4 p.m. on 04/04/2015). Per this discussion, it was determined that ultrasound was not needed for further characterization. Results were also provided in writing to the patient at the conclusion of the visit. BI-RADS CATEGORY  2: Benign. Electronically Signed   By: Franki Cabot M.D.   On: 04/04/2015 18:11     Assessment & Plan:  Plan    Meds ordered this encounter  Medications   insulin glargine-yfgn (SEMGLEE) 100  UNIT/ML Pen    Sig: Inject 40-50 Units into the skin at bedtime.    Problem List Items Addressed This Visit     Hypertension (Chronic)    Well controlled, no  changes to meds. Encouraged heart healthy diet such as the DASH diet and exercise as tolerated.       Relevant Orders   CBC   Comprehensive metabolic panel   TSH   Hyperlipidemia    Tolerating statin, encouraged heart healthy diet, avoid trans fats, minimize simple carbs and saturated fats. Increase exercise as tolerated      Relevant Orders   Lipid panel   Vitamin D deficiency    Supplement and monitor      Relevant Orders   VITAMIN D 25 Hydroxy (Vit-D Deficiency, Fractures)   Well controlled type 2 diabetes mellitus with peripheral neuropathy (HCC)    hgba1c acceptable, minimize simple carbs. Increase exercise as tolerated. Continue current meds. Follows with the [facility] hospital system as well      Relevant Medications   insulin aspart (NOVOLOG) 100 UNIT/ML FlexPen   glucose-Vitamin C 4-0.006 GM CHEW chewable tablet   insulin glargine-yfgn (SEMGLEE) 100 UNIT/ML Pen   Other Relevant Orders   Hemoglobin A1c   Obesity (BMI 30-39.9)    maintain heart healthy diet, decrease po intake and increase exercise as tolerated. Needs 7-8 hours of sleep nightly. Avoid trans fats, eat small, frequent meals every 4-5 hours with lean proteins, complex carbs and healthy fats. Minimize simple carbs, high fat foods and processed foods      Relevant Medications   insulin aspart (NOVOLOG) 100 UNIT/ML FlexPen   glucose-Vitamin C 4-0.006 GM CHEW chewable tablet   insulin glargine-yfgn (SEMGLEE) 100 UNIT/ML Pen    Follow-up: Return in about 4 months (around 05/11/2021), or labs on Feb 20 before f/u visit Feb 27.  I, Suezanne Jacquet, acting as a scribe for Penni Homans, MD, have documented all relevent documentation on behalf of Penni Homans, MD, as directed by Penni Homans, MD while in the presence of Penni Homans, MD. DO:01/11/21.  I, Mosie Lukes, MD personally performed the services described in this documentation. All medical record entries made by the scribe were at my direction and in my presence. I have reviewed the chart and agree that the record reflects my personal performance and is accurate and complete

## 2021-01-08 NOTE — Assessment & Plan Note (Signed)
Well controlled, no changes to meds. Encouraged heart healthy diet such as the DASH diet and exercise as tolerated.  °

## 2021-01-08 NOTE — Assessment & Plan Note (Addendum)
hgba1c acceptable, minimize simple carbs. Increase exercise as tolerated. Continue current meds. Follows with the [facility] hospital system as well

## 2021-01-08 NOTE — Assessment & Plan Note (Signed)
Tolerating statin, encouraged heart healthy diet, avoid trans fats, minimize simple carbs and saturated fats. Increase exercise as tolerated 

## 2021-01-09 ENCOUNTER — Encounter: Payer: Self-pay | Admitting: Family Medicine

## 2021-01-11 NOTE — Assessment & Plan Note (Signed)
maintain heart healthy diet, decrease po intake and increase exercise as tolerated. Needs 7-8 hours of sleep nightly. Avoid trans fats, eat small, frequent meals every 4-5 hours with lean proteins, complex carbs and healthy fats. Minimize simple carbs, high fat foods and processed foods

## 2021-01-14 ENCOUNTER — Ambulatory Visit: Payer: Medicare HMO | Attending: Internal Medicine

## 2021-01-14 DIAGNOSIS — Z23 Encounter for immunization: Secondary | ICD-10-CM

## 2021-01-14 NOTE — Progress Notes (Signed)
   [id number]  Name:  Michael Cunningham.    MRN: [id number] DOB: 29-Mar-1945  01/14/2021  [name] was observed post Covid-19 immunization for 15 minutes without incident. He was provided with Vaccine Information Sheet and instruction to access the V-Safe system.   [name] was instructed to call 911 with any severe reactions post vaccine: Difficulty breathing  Swelling of face and throat  A fast heartbeat  A bad rash all over body  Dizziness and weakness   Immunizations Administered     Name Date Dose VIS Date Route   Pfizer Covid-19 Vaccine Bivalent Booster 01/14/2021  9:01 AM 0.3 mL 11/12/2020 Intramuscular   Manufacturer: [place]   Lot: [id number]   [place]: [place], PharmD, MBA Clinical Acute Care Pharmacist

## 2021-01-15 ENCOUNTER — Ambulatory Visit: Payer: Medicare HMO | Admitting: Cardiology

## 2021-01-19 ENCOUNTER — Other Ambulatory Visit (HOSPITAL_BASED_OUTPATIENT_CLINIC_OR_DEPARTMENT_OTHER): Payer: Self-pay

## 2021-01-19 ENCOUNTER — Other Ambulatory Visit: Payer: Self-pay

## 2021-01-19 ENCOUNTER — Ambulatory Visit: Payer: Medicare HMO | Admitting: Podiatry

## 2021-01-19 DIAGNOSIS — D689 Coagulation defect, unspecified: Secondary | ICD-10-CM | POA: Diagnosis not present

## 2021-01-19 DIAGNOSIS — B351 Tinea unguium: Secondary | ICD-10-CM | POA: Diagnosis not present

## 2021-01-19 DIAGNOSIS — E1149 Type 2 diabetes mellitus with other diabetic neurological complication: Secondary | ICD-10-CM | POA: Diagnosis not present

## 2021-01-19 DIAGNOSIS — M79676 Pain in unspecified toe(s): Secondary | ICD-10-CM

## 2021-01-19 MED ORDER — CLOTRIMAZOLE-BETAMETHASONE 1-0.05 % EX CREA
1.0000 | TOPICAL_CREAM | Freq: Two times a day (BID) | CUTANEOUS | 0 refills | Status: DC
Start: 2021-01-19 — End: 2021-12-29
  Filled 2021-01-19: qty 30, 15d supply, fill #0

## 2021-01-20 NOTE — Progress Notes (Signed)
Subjective: 75 y.o. returns the office today for painful, elongated, thickened toenails which he cannot trim himself. Denies any redness or drainage around the nails. Denies any open sores. The toes are starting to get the red rash again. He has not been using the cream. He has no new concerns. Denies any systemic complaints such as fevers, chills, nausea, vomiting.   PCP: Mosie Lukes, MD - Last seen 01/08/2021 Endocrinologist: Dr. Benjiman Core, MD   Last A1c was 7.5 on 01/01/2021  Objective: AAO 3, NAD DP/PT pulses palpable, CRT less than 3 seconds Sensation is mildly decreased with SWM but overall no change.  Neuropathy symptoms are unchanged. Nails hypertrophic, dystrophic, elongated, brittle, discolored 10. There is tenderness overlying the nails 1-5 bilaterally. There is no surrounding erythema or drainage along the nail sites. Red, erythematous, dry rash present on the dorsal aspect of the digits without any skin breakdown, warmth, drainage.  No pain with calf compression, swelling, warmth, erythema.  Assessment: Patient presents with symptomatic onychomycosis; likely tinea pedis  Plan: -Treatment options including alternatives, risks, complications were discussed -Nails sharply debrided 10 without complication/bleeding. -Continue to monitor neuropathy-since the symptoms are stable. -Continue cream for the toes- refilled Lotrisone which was refilled today.   -Discussed daily foot inspection. If there are any changes, to call the office immediately.  -Follow-up in 3 months or sooner if any problems are to arise. In the meantime, encouraged to call the office with any questions, concerns, changes symptoms.  Celesta Gentile, DPM

## 2021-01-23 DIAGNOSIS — G4733 Obstructive sleep apnea (adult) (pediatric): Secondary | ICD-10-CM | POA: Diagnosis not present

## 2021-01-26 ENCOUNTER — Other Ambulatory Visit (HOSPITAL_BASED_OUTPATIENT_CLINIC_OR_DEPARTMENT_OTHER): Payer: Self-pay

## 2021-01-26 MED FILL — Cetirizine HCl Tab 10 MG: ORAL | 100 days supply | Qty: 100 | Fill #2 | Status: AC

## 2021-01-28 NOTE — Progress Notes (Signed)
Cardiology Office Note   Date:  01/30/2021   ID:  Michael Ensign [place] Brooke Bonito., DOB April 01, 1945, MRN [id number]  PCP:  Mosie Lukes, MD  Cardiologist:   None   Chief Complaint  Patient presents with   Coronary Artery Disease       History of Present Illness: Michael Cunningham. is a 75 y.o. male who presents for follow-up of his known coronary disease.  He has done well since I last seen him.  He is not working out on his machine at home like he was doing every 3 to 5 days.  He is doing his activities of daily living. The patient denies any new symptoms such as chest discomfort, neck or arm discomfort. There has been no new shortness of breath, PND or orthopnea. There have been no reported palpitations, presyncope or syncope.     Past Medical History:  Diagnosis Date   CAD (coronary artery disease)    a. 05/2014 [place] s/p DES x4 to mid-distLAD and DESx1 to DI   Complication of anesthesia    11'12 Surgery with excrutiating "head about to explode" upon awakening  -lasted several hours postop  ( SURGERY DONE 08-13-2011 AT [facility] PT DID OK POST OP)   Diverticulosis of colon    LEFT SIDE   Exposure to Agent [facility] 10/18/2016   [place]   History of small bowel obstruction    X2  28 YRS AGO AND LAST ONE 04/2014--  RESOLVED WITHOUT SURGICAL INTERVENTION   Hyperlipidemia    Hypertension    Knee pain, bilateral 08/13/2011   Left ureteral calculus    Multiple lipomas    hx. mulitple and some remains -arms, legs   Obesity, unspecified 09/18/2013   Obstructive sleep apnea 01/01/2016   Recurrent kidney stones 07/08/2013   Follows with Dr Gaynelle Arabian He believes they are Calcium oxalate stones   Thrombocytopenia ([place]) 07/07/2014   Type 2 diabetes mellitus ([place])    Vitamin D deficiency 07/02/2014    Past Surgical History:  Procedure Laterality Date   COLONOSCOPY W/ POLYPECTOMY  LAST ONE 2013   CORONARY STENT PLACEMENT  05/17/2014   PROXIMAL X 4  MID & DISTAL LAD  DIAGONAL    KNEE ARTHROSCOPY   08/13/2011   right ARTHROSCOPY KNEE;  Surgeon: Gearlean Alf, MD;  Location: [facility];  Service: Orthopedics;  Laterality: Right;  medial  meniscal debridement, excision of plica, synovectomy   KNEE ARTHROSCOPY Left 11/16/2013   Procedure: LEFT KNEE ARTHROSCOPY MEDIAL MENISCAL DEBRIDEMENT, CHONDROPLASTY;  Surgeon: Gearlean Alf, MD;  Location: [facility];  Service: Orthopedics;  Laterality: Left;   KNEE ARTHROSCOPY W/ MENISCECTOMY Right 01-29-2011   twice, first trimmed mediscus and cyst   LEFT HEART CATHETERIZATION WITH CORONARY ANGIOGRAM N/A 05/17/2014   Procedure: LEFT HEART CATHETERIZATION WITH CORONARY ANGIOGRAM;  Surgeon: Burnell Blanks, MD;  Location: [facility] CATH LAB;  Service: Cardiovascular;  Laterality: N/A;   LIPOMA EXCISION  1975   multiple, 10-12   PERCUTANEOUS CORONARY STENT INTERVENTION (PCI-S)  05/17/2014   Procedure: PERCUTANEOUS CORONARY STENT INTERVENTION (PCI-S);  Surgeon: Burnell Blanks, MD;  Location: [facility] CATH LAB;  Service: Cardiovascular;;  Diag1 and Prox to Distal LAD     Current Outpatient Medications  Medication Sig Dispense Refill   Accu-Chek FastClix Lancets MISC Use to test blood sugar 2 times daily. Dx: E11.40 204 each 5   acetaminophen (TYLENOL) 500 MG tablet Take 1,000 mg by mouth every 6 (six) hours as needed for  mild pain. Reported on 03/31/2015     aspirin EC 81 MG tablet Take 81 mg by mouth daily with breakfast.      cetirizine (ZYRTEC) 10 MG tablet TAKE 1 TABLET BY MOUTH EVERY MORNING 100 tablet 3   Cholecalciferol 50 MCG (2000 UT) TABS Take 2 tablets by mouth daily.     clotrimazole-betamethasone (LOTRISONE) cream Apply 1 application topically 2 (two) times daily. 30 g 0   COVID-19 mRNA Vac-TriS, Pfizer, (PFIZER-BIONT COVID-19 VAC-TRIS) SUSP injection Inject into the muscle. 0.3 mL 0   glucagon (GLUCAGON EMERGENCY) 1 MG injection Inject 1 mg into the muscle once as needed for up to 1 dose. 1 each 12   glucose-Vitamin C 4-0.006 GM CHEW chewable tablet  CHEW FOUR TABLETS BY MOUTH  AS NEEDED (REPEAT EVERY 15 MINUTES IF BLOOD SUGAR LESS THAN 70)     insulin aspart (NOVOLOG) 100 UNIT/ML injection Inject 15 Units into the skin 3 (three) times daily before meals. Inject in skin before meals     insulin glargine (LANTUS) 100 UNIT/ML injection Inject 40-50 Units into the skin daily.     losartan (COZAAR) 100 MG tablet Take 1 tablet (100 mg total) by mouth daily. (Patient taking differently: Take 75 mg by mouth daily.) 90 tablet 0   metoprolol tartrate (LOPRESSOR) 25 MG tablet Take 1 tablet (25 mg total) by mouth 2 (two) times daily. 180 tablet 3   Multiple Vitamin (MULTIVITAMIN WITH MINERALS) TABS tablet Take 1 tablet by mouth daily.     nitroGLYCERIN (NITROSTAT) 0.4 MG SL tablet Place 1 tablet (0.4 mg total) under the tongue every 5 (five) minutes as needed for chest pain. 25 tablet 3   prasugrel (EFFIENT) 10 MG TABS tablet Take 1 tablet (10 mg total) by mouth daily. 90 tablet 3   simvastatin (ZOCOR) 40 MG tablet TAKE 1 TABLET (40 MG) BY MOUTH AT BEDTIME. 90 tablet 2   No current facility-administered medications for this visit.    Allergies:   Ciprofloxacin, Lipitor [atorvastatin calcium], Lisinopril, and Metformin and related    ROS:  Please see the history of present illness.   Otherwise, review of systems are positive for none.   All other systems are reviewed and negative.    PHYSICAL EXAM: VS:  BP 138/74   Pulse (!) 52   Ht 5\' 9"  (1.753 m)   Wt 245 lb 3.2 oz (111.2 kg)   SpO2 98%   BMI 36.21 kg/m  , BMI Body mass index is 36.21 kg/m. GENERAL:  Well appearing NECK:  No jugular venous distention, waveform within normal limits, carotid upstroke brisk and symmetric, no bruits, no thyromegaly LUNGS:  Clear to auscultation bilaterally CHEST:  Unremarkable HEART:  PMI not displaced or sustained,S1 and S2 within normal limits, no S3, no S4, no clicks, no rubs, no murmurs ABD:  Flat, positive bowel sounds normal in frequency in pitch, no  bruits, no rebound, no guarding, no midline pulsatile mass, no hepatomegaly, no splenomegaly EXT:  2 plus pulses throughout, no edema, no cyanosis no clubbing  EKG:  EKG is  ordered today. The ekg ordered today demonstrates sinus rhythm, right bundle branch block, rate 52, axis within normal limits, intervals within normal limits, no acute ST-T wave changes.  No change from previous   Recent Labs: 01/01/2021: ALT 22; BUN 14; Creatinine, Ser 0.88; Hemoglobin 14.3; Platelets 141.0; Potassium 4.3; Sodium 138; TSH 3.16    Lipid Panel    Component Value Date/Time   CHOL 126 01/01/2021 0911  TRIG 131.0 01/01/2021 0911   HDL 34.30 (L) 01/01/2021 0911   CHOLHDL 4 01/01/2021 0911   VLDL 26.2 01/01/2021 0911   LDLCALC 65 01/01/2021 0911   LDLCALC 98 11/21/2019 0827   LDLDIRECT 108.0 04/16/2014 1022      Wt Readings from Last 3 Encounters:  01/30/21 245 lb 3.2 oz (111.2 kg)  01/08/21 248 lb 9.6 oz (112.8 kg)  11/11/20 251 lb 6.4 oz (114 kg)      Other studies Reviewed: Additional studies/ records that were reviewed today include: Labs. Review of the above records demonstrates:  See elsewhere  ASSESSMENT AND PLAN:   CAD/PCI/DES:  The patient has no new sypmtoms.  No further cardiovascular testing is indicated.  We will continue with aggressive risk reduction and meds as listed.  HTN:  The blood pressure is at target.  No change in therapy.   OVERWEIGHT: He is down a little bit and we talked about low carbohydrate.   DYSLIPIDEMIA:  His LDL was 61 with an HDL of 32.9.  No change in therapy.  DM: A1c slightly elevated at 7.6 which is up from 7.4.   He sees  the [place] endocrinologist.     Current medicines are reviewed at length with the patient today.  The patient does not have concerns regarding medicines.  The following changes have been made:  None  Labs/ tests ordered today include: None  No orders of the defined types were placed in this encounter.    Disposition:   FU  with me in 12 months   Signed, Minus Breeding, MD  01/30/2021 9:02 AM    [place]

## 2021-01-30 ENCOUNTER — Encounter: Payer: Self-pay | Admitting: Cardiology

## 2021-01-30 ENCOUNTER — Ambulatory Visit: Payer: Medicare HMO | Admitting: Cardiology

## 2021-01-30 ENCOUNTER — Other Ambulatory Visit: Payer: Self-pay

## 2021-01-30 VITALS — BP 138/74 | HR 52 | Ht 69.0 in | Wt 245.2 lb

## 2021-01-30 DIAGNOSIS — E785 Hyperlipidemia, unspecified: Secondary | ICD-10-CM | POA: Diagnosis not present

## 2021-01-30 DIAGNOSIS — G4730 Sleep apnea, unspecified: Secondary | ICD-10-CM

## 2021-01-30 DIAGNOSIS — I10 Essential (primary) hypertension: Secondary | ICD-10-CM

## 2021-01-30 DIAGNOSIS — I2510 Atherosclerotic heart disease of native coronary artery without angina pectoris: Secondary | ICD-10-CM | POA: Diagnosis not present

## 2021-01-30 NOTE — Patient Instructions (Signed)
Medication Instructions:  Continue same medications *If you need a refill on your cardiac medications before your next appointment, please call your pharmacy*   Lab Work: None ordered   Testing/Procedures: None ordered   Follow-Up: At [facility], you and your health needs are our priority.  As part of our continuing mission to provide you with exceptional heart care, we have created designated Provider Care Teams.  These Care Teams include your primary Cardiologist (physician) and Advanced Practice Providers (APPs -  Physician Assistants and Nurse Practitioners) who all work together to provide you with the care you need, when you need it.  We recommend signing up for the patient portal called "MyChart".  Sign up information is provided on this After Visit Summary.  MyChart is used to connect with patients for Virtual Visits (Telemedicine).  Patients are able to view lab/test results, encounter notes, upcoming appointments, etc.  Non-urgent messages can be sent to your provider as well.   To learn more about what you can do with MyChart, go to [email or web address].      Your next appointment:  1 year       The format for your next appointment: Office   Provider:  Dr.Hochrein

## 2021-02-03 ENCOUNTER — Other Ambulatory Visit (HOSPITAL_BASED_OUTPATIENT_CLINIC_OR_DEPARTMENT_OTHER): Payer: Self-pay

## 2021-02-03 MED ORDER — PFIZER COVID-19 VAC BIVALENT 30 MCG/0.3ML IM SUSP
INTRAMUSCULAR | 0 refills | Status: DC
  Filled 2021-02-03: qty 0.3, 1d supply, fill #0

## 2021-02-10 ENCOUNTER — Ambulatory Visit (INDEPENDENT_AMBULATORY_CARE_PROVIDER_SITE_OTHER): Payer: Medicare HMO

## 2021-02-10 VITALS — Ht 71.0 in | Wt 245.0 lb

## 2021-02-10 DIAGNOSIS — Z0000 Encounter for general adult medical examination without abnormal findings: Secondary | ICD-10-CM | POA: Diagnosis not present

## 2021-02-10 NOTE — Patient Instructions (Signed)
Michael Cunningham , Thank you for taking time to complete your Medicare Wellness Visit. I appreciate your ongoing commitment to your health goals. Please review the following plan we discussed and let me know if I can assist you in the future.   Screening recommendations/referrals: Colonoscopy: Per our conversation, you will schedule with GI. Recommended yearly ophthalmology/optometry visit for glaucoma screening and checkup Recommended yearly dental visit for hygiene and checkup  Vaccinations: Influenza vaccine: Up to date Pneumococcal vaccine: Up to date Tdap vaccine: Up to date Shingles vaccine: Completed vaccines   Covid-19: Up to date  Advanced directives: Copy in chart  Conditions/risks identified: See problem llist  Next appointment: Follow up in one year for your annual wellness visit.   Preventive Care 76 Years and Older, Male Preventive care refers to lifestyle choices and visits with your health care provider that can promote health and wellness. What does preventive care include? A yearly physical exam. This is also called an annual well check. Dental exams once or twice a year. Routine eye exams. Ask your health care provider how often you should have your eyes checked. Personal lifestyle choices, including: Daily care of your teeth and gums. Regular physical activity. Eating a healthy diet. Avoiding tobacco and drug use. Limiting alcohol use. Practicing safe sex. Taking low doses of aspirin every day. Taking vitamin and mineral supplements as recommended by your health care provider. What happens during an annual well check? The services and screenings done by your health care provider during your annual well check will depend on your age, overall health, lifestyle risk factors, and family history of disease. Counseling  Your health care provider may ask you questions about your: Alcohol use. Tobacco use. Drug use. Emotional well-being. Home and relationship  well-being. Sexual activity. Eating habits. History of falls. Memory and ability to understand (cognition). Work and work Statistician. Screening  You may have the following tests or measurements: Height, weight, and BMI. Blood pressure. Lipid and cholesterol levels. These may be checked every 5 years, or more frequently if you are over 1 years old. Skin check. Lung cancer screening. You may have this screening every year starting at age 32 if you have a 30-pack-year history of smoking and currently smoke or have quit within the past 15 years. Fecal occult blood test (FOBT) of the stool. You may have this test every year starting at age 59. Flexible sigmoidoscopy or colonoscopy. You may have a sigmoidoscopy every 5 years or a colonoscopy every 10 years starting at age 60. Prostate cancer screening. Recommendations will vary depending on your family history and other risks. Hepatitis C blood test. Hepatitis B blood test. Sexually transmitted disease (STD) testing. Diabetes screening. This is done by checking your blood sugar (glucose) after you have not eaten for a while (fasting). You may have this done every 1-3 years. Abdominal aortic aneurysm (AAA) screening. You may need this if you are a current or former smoker. Osteoporosis. You may be screened starting at age 64 if you are at high risk. Talk with your health care provider about your test results, treatment options, and if necessary, the need for more tests. Vaccines  Your health care provider may recommend certain vaccines, such as: Influenza vaccine. This is recommended every year. Tetanus, diphtheria, and acellular pertussis (Tdap, Td) vaccine. You may need a Td booster every 10 years. Zoster vaccine. You may need this after age 66. Pneumococcal 13-valent conjugate (PCV13) vaccine. One dose is recommended after age 53. Pneumococcal polysaccharide (PPSV23)  vaccine. One dose is recommended after age 57. Talk to your health care  provider about which screenings and vaccines you need and how often you need them. This information is not intended to replace advice given to you by your health care provider. Make sure you discuss any questions you have with your health care provider. Document Released: 03/28/2015 Document Revised: 11/19/2015 Document Reviewed: 12/31/2014 Elsevier Interactive Patient Education  2017 [place] Prevention in the Home Falls can cause injuries. They can happen to people of all ages. There are many things you can do to make your home safe and to help prevent falls. What can I do on the outside of my home? Regularly fix the edges of walkways and driveways and fix any cracks. Remove anything that might make you trip as you walk through a door, such as a raised step or threshold. Trim any bushes or trees on the path to your home. Use bright outdoor lighting. Clear any walking paths of anything that might make someone trip, such as rocks or tools. Regularly check to see if handrails are loose or broken. Make sure that both sides of any steps have handrails. Any raised decks and porches should have guardrails on the edges. Have any leaves, snow, or ice cleared regularly. Use sand or salt on walking paths during winter. Clean up any spills in your garage right away. This includes oil or grease spills. What can I do in the bathroom? Use night lights. Install grab bars by the toilet and in the tub and shower. Do not use towel bars as grab bars. Use non-skid mats or decals in the tub or shower. If you need to sit down in the shower, use a plastic, non-slip stool. Keep the floor dry. Clean up any water that spills on the floor as soon as it happens. Remove soap buildup in the tub or shower regularly. Attach bath mats securely with double-sided non-slip rug tape. Do not have throw rugs and other things on the floor that can make you trip. What can I do in the bedroom? Use night lights. Make  sure that you have a light by your bed that is easy to reach. Do not use any sheets or blankets that are too big for your bed. They should not hang down onto the floor. Have a firm chair that has side arms. You can use this for support while you get dressed. Do not have throw rugs and other things on the floor that can make you trip. What can I do in the kitchen? Clean up any spills right away. Avoid walking on wet floors. Keep items that you use a lot in easy-to-reach places. If you need to reach something above you, use a strong step stool that has a grab bar. Keep electrical cords out of the way. Do not use floor polish or wax that makes floors slippery. If you must use wax, use non-skid floor wax. Do not have throw rugs and other things on the floor that can make you trip. What can I do with my stairs? Do not leave any items on the stairs. Make sure that there are handrails on both sides of the stairs and use them. Fix handrails that are broken or loose. Make sure that handrails are as long as the stairways. Check any carpeting to make sure that it is firmly attached to the stairs. Fix any carpet that is loose or worn. Avoid having throw rugs at the top or bottom  of the stairs. If you do have throw rugs, attach them to the floor with carpet tape. Make sure that you have a light switch at the top of the stairs and the bottom of the stairs. If you do not have them, ask someone to add them for you. What else can I do to help prevent falls? Wear shoes that: Do not have high heels. Have rubber bottoms. Are comfortable and fit you well. Are closed at the toe. Do not wear sandals. If you use a stepladder: Make sure that it is fully opened. Do not climb a closed stepladder. Make sure that both sides of the stepladder are locked into place. Ask someone to hold it for you, if possible. Clearly mark and make sure that you can see: Any grab bars or handrails. First and last steps. Where the  edge of each step is. Use tools that help you move around (mobility aids) if they are needed. These include: Canes. Walkers. Scooters. Crutches. Turn on the lights when you go into a dark area. Replace any light bulbs as soon as they burn out. Set up your furniture so you have a clear path. Avoid moving your furniture around. If any of your floors are uneven, fix them. If there are any pets around you, be aware of where they are. Review your medicines with your doctor. Some medicines can make you feel dizzy. This can increase your chance of falling. Ask your doctor what other things that you can do to help prevent falls. This information is not intended to replace advice given to you by your health care provider. Make sure you discuss any questions you have with your health care provider. Document Released: 12/26/2008 Document Revised: 08/07/2015 Document Reviewed: 04/05/2014 Elsevier Interactive Patient Education  2017 [employer].

## 2021-02-10 NOTE — Progress Notes (Signed)
Subjective:   Michael Cunningham is a 75 y.o. male who presents for Medicare Annual/Subsequent preventive examination.  I connected with Juliano today by telephone and verified that I am speaking with the correct person using two identifiers. Location patient: home Location provider: work Persons participating in the virtual visit: patient, Marine scientist.    I discussed the limitations, risks, security and privacy concerns of performing an evaluation and management service by telephone and the availability of in person appointments. I also discussed with the patient that there may be a patient responsible charge related to this service. The patient expressed understanding and verbally consented to this telephonic visit.    Interactive audio and video telecommunications were attempted between this provider and patient, however failed, due to patient having technical difficulties OR patient did not have access to video capability.  We continued and completed visit with audio only.  Some vital signs may be absent or patient reported.   Time Spent with patient on telephone encounter: 20 minutes   Review of Systems     Cardiac Risk Factors include: advanced age (>67men, >51 women);hypertension;male gender;diabetes mellitus;dyslipidemia;obesity (BMI >30kg/m2);sedentary lifestyle     Objective:    Today's Vitals   02/10/21 1528  Weight: 245 lb (111.1 kg)  Height: 5\' 11"  (1.803 m)   Body mass index is 34.17 kg/m.  Advanced Directives 02/10/2021 10/04/2019 09/26/2018 09/20/2017 09/16/2016 08/24/2015 08/18/2015  Does Patient Have a Medical Advance Directive? Yes Yes Yes Yes Yes Yes Yes  Type of Paramedic of [place];Living will [place];Living will [place];Living will [place];Living will [place];Living will [place];Living will [place];Living will  Does patient  want to make changes to medical advance directive? - No - Patient declined No - Patient declined No - Patient declined - No - Patient declined No - Patient declined  Copy of [place] in Chart? Yes - validated most recent copy scanned in chart (See row information) Yes - validated most recent copy scanned in chart (See row information) Yes - validated most recent copy scanned in chart (See row information) Yes No - copy requested No - copy requested No - copy requested  Would patient like information on creating a medical advance directive? - - - - - - -  Pre-existing out of facility DNR order (yellow form or pink MOST form) - - - - - - -    Current Medications (verified) Outpatient Encounter Medications as of 02/10/2021  Medication Sig   Accu-Chek FastClix Lancets MISC Use to test blood sugar 2 times daily. Dx: E11.40   acetaminophen (TYLENOL) 500 MG tablet Take 1,000 mg by mouth every 6 (six) hours as needed for mild pain. Reported on 03/31/2015   aspirin EC 81 MG tablet Take 81 mg by mouth daily with breakfast.    cetirizine (ZYRTEC) 10 MG tablet TAKE 1 TABLET BY MOUTH EVERY MORNING   Cholecalciferol 50 MCG (2000 UT) TABS Take 2 tablets by mouth daily.   clotrimazole-betamethasone (LOTRISONE) cream Apply 1 application topically 2 (two) times daily.   glucagon (GLUCAGON EMERGENCY) 1 MG injection Inject 1 mg into the muscle once as needed for up to 1 dose.   glucose-Vitamin C 4-0.006 GM CHEW chewable tablet CHEW FOUR TABLETS BY MOUTH  AS NEEDED (REPEAT EVERY 15 MINUTES IF BLOOD SUGAR LESS THAN 70)   insulin aspart (NOVOLOG) 100 UNIT/ML injection Inject 15 Units into the  skin 3 (three) times daily before meals. Inject in skin before meals   insulin glargine (LANTUS) 100 UNIT/ML injection Inject 40-50 Units into the skin daily.   losartan (COZAAR) 100 MG tablet Take 1 tablet (100 mg total) by mouth daily. (Patient taking differently: Take 75 mg by mouth daily.)   metoprolol  tartrate (LOPRESSOR) 25 MG tablet Take 1 tablet (25 mg total) by mouth 2 (two) times daily.   Multiple Vitamin (MULTIVITAMIN WITH MINERALS) TABS tablet Take 1 tablet by mouth daily.   nitroGLYCERIN (NITROSTAT) 0.4 MG SL tablet Place 1 tablet (0.4 mg total) under the tongue every 5 (five) minutes as needed for chest pain.   prasugrel (EFFIENT) 10 MG TABS tablet Take 1 tablet (10 mg total) by mouth daily.   simvastatin (ZOCOR) 40 MG tablet TAKE 1 TABLET (40 MG) BY MOUTH AT BEDTIME.   COVID-19 mRNA bivalent vaccine, Pfizer, (PFIZER COVID-19 VAC BIVALENT) injection Inject into the muscle. (Patient not taking: Reported on 02/10/2021)   COVID-19 mRNA Vac-TriS, Pfizer, (PFIZER-BIONT COVID-19 VAC-TRIS) SUSP injection Inject into the muscle. (Patient not taking: Reported on 02/10/2021)   [DISCONTINUED] famotidine (PEPCID) 20 MG tablet Take 1 tablet (20 mg total) by mouth at bedtime. (Patient not taking: Reported on 03/17/2020)   No facility-administered encounter medications on file as of 02/10/2021.    Allergies (verified) Ciprofloxacin, Lipitor [atorvastatin calcium], Lisinopril, and Metformin and related   History: Past Medical History:  Diagnosis Date   CAD (coronary artery disease)    a. 05/2014 [place] s/p DES x4 to mid-distLAD and DESx1 to DI   Complication of anesthesia    11'12 Surgery with excrutiating "head about to explode" upon awakening  -lasted several hours postop  ( SURGERY DONE 08-13-2011 AT [facility] PT DID OK POST OP)   Diverticulosis of colon    LEFT SIDE   Exposure to Agent [facility] 10/18/2016   [place]   History of small bowel obstruction    X2  28 YRS AGO AND LAST ONE 04/2014--  RESOLVED WITHOUT SURGICAL INTERVENTION   Hyperlipidemia    Hypertension    Knee pain, bilateral 08/13/2011   Left ureteral calculus    Multiple lipomas    hx. mulitple and some remains -arms, legs   Obesity, unspecified 09/18/2013   Obstructive sleep apnea 01/01/2016   Recurrent kidney stones 07/08/2013   Follows  with Dr Gaynelle Arabian He believes they are Calcium oxalate stones   Thrombocytopenia ([place]) 07/07/2014   Type 2 diabetes mellitus ([place])    Vitamin D deficiency 07/02/2014   Past Surgical History:  Procedure Laterality Date   COLONOSCOPY W/ POLYPECTOMY  LAST ONE 2013   CORONARY STENT PLACEMENT  05/17/2014   PROXIMAL X 4  MID & DISTAL LAD  DIAGONAL    KNEE ARTHROSCOPY  08/13/2011   right ARTHROSCOPY KNEE;  Surgeon: Gearlean Alf, MD;  Location: [facility]S;  Service: Orthopedics;  Laterality: Right;  medial  meniscal debridement, excision of plica, synovectomy   KNEE ARTHROSCOPY Left 11/16/2013   Procedure: LEFT KNEE ARTHROSCOPY MEDIAL MENISCAL DEBRIDEMENT, CHONDROPLASTY;  Surgeon: Gearlean Alf, MD;  Location: [facility];  Service: Orthopedics;  Laterality: Left;   KNEE ARTHROSCOPY W/ MENISCECTOMY Right 01-29-2011   twice, first trimmed mediscus and cyst   LEFT HEART CATHETERIZATION WITH CORONARY ANGIOGRAM N/A 05/17/2014   Procedure: LEFT HEART CATHETERIZATION WITH CORONARY ANGIOGRAM;  Surgeon: Burnell Blanks, MD;  Location: [facility] CATH LAB;  Service: Cardiovascular;  Laterality: N/A;   [place]   multiple, 10-12  PERCUTANEOUS CORONARY STENT INTERVENTION (PCI-S)  05/17/2014   Procedure: PERCUTANEOUS CORONARY STENT INTERVENTION (PCI-S);  Surgeon: Burnell Blanks, MD;  Location: [facility] CATH LAB;  Service: Cardiovascular;;  Diag1 and Prox to Distal LAD   Family History  Problem Relation Age of Onset   Hypertension Mother    Cancer Mother 82       ovarian   Heart disease Father        CHF   Heart attack Father 17   Cancer Sister        breast- just finished her last radiation   Heart disease Maternal Grandfather        ?   Heart attack Paternal Grandfather 62   Heart attack Paternal Uncle 46       Sudden death   Diabetes Neg Hx    Social History   Socioeconomic History   Marital status: Married    Spouse name: Not on file   Number of children: 2   Years of education: Not  on file   Highest education level: Not on file  Occupational History   Not on file  Tobacco Use   Smoking status: Former    Packs/day: 1.00    Years: 4.00    Pack years: 4.00    Types: Cigarettes    Quit date: 06/14/1967    Years since quitting: 53.6   Smokeless tobacco: Never  Substance and Sexual Activity   Alcohol use: No    Alcohol/week: 0.0 standard drinks   Drug use: No   Sexual activity: Yes    Comment: lives with wife  Other Topics Concern   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     Social Determinants of Health   Financial Resource Strain: Low Risk    Difficulty of Paying Living Expenses: Not hard at all  Food Insecurity: No Food Insecurity   Worried About Charity fundraiser in the Last Year: Never true   [place] in the Last Year: Never true  Transportation Needs: No Transportation Needs   Lack of Transportation (Medical): No   Lack of Transportation (Non-Medical): No  Physical Activity: Inactive   Days of Exercise per Week: 0 days   Minutes of Exercise per Session: 0 min  Stress: No Stress Concern Present   Feeling of Stress : Not at all  Social Connections: Socially Integrated   Frequency of Communication with Friends and Family: Not on file   Frequency of Social Gatherings with Friends and Family: More than three times a week   Attends Religious Services: More than 4 times per year   Active Member of [employer] or Organizations: Yes   Attends Music therapist: More than 4 times per year   Marital Status: Married    Tobacco Counseling Counseling given: Not Answered   Clinical Intake:  Pre-visit preparation completed: Yes  Pain : No/denies pain     BMI - recorded: 34.17 Nutritional Status: BMI > 30  Obese Nutritional Risks: None Diabetes: Yes CBG done?: No Did pt. bring in CBG monitor from home?: No  How often do you need to have someone help you when you read instructions,  pamphlets, or other written materials from your doctor or pharmacy?: 1 - Never  Diabetes:  Is the patient diabetic?  Yes  If diabetic, was a CBG obtained today?  No  Did the patient bring in their glucometer from home?  No phone visit  How often do you monitor your CBG's? Continuous glucose monitor.   Financial Strains and Diabetes Management:  Are you having any financial strains with the device, your supplies or your medication? No .  Does the patient want to be seen by Chronic Care Management for management of their diabetes?  No  Would the patient like to be referred to a Nutritionist or for Diabetic Management?  No   Diabetic Exams:  Diabetic Eye Exam: Completed 11/10/2020.   Diabetic Foot Exam:Pt has been advised about the importance in completing this exam. To be completed by PCP.  Interpreter Needed?: No  Information entered by :: Caroleen Hamman LPN   Activities of Daily Living In your present state of health, do you have any difficulty performing the following activities: 02/10/2021 01/08/2021  Hearing? N Y  Vision? N N  Difficulty concentrating or making decisions? N N  Walking or climbing stairs? N N  Dressing or bathing? N N  Doing errands, shopping? N N  Preparing Food and eating ? N -  Using the Toilet? N -  In the past six months, have you accidently leaked urine? N -  Do you have problems with loss of bowel control? N -  Managing your Medications? N -  Managing your Finances? N -  Housekeeping or managing your Housekeeping? N -  Some recent data might be hidden    Patient Care Team: Mosie Lukes, MD as PCP - General (Family Medicine) Minus Breeding, MD as Consulting Physician (Cardiology) Philemon Kingdom, MD as Consulting Physician (Endocrinology) Gaynelle Arabian, MD as Consulting Physician (Orthopedic Surgery) Troy Sine, MD as Consulting Physician (Cardiology) Luberta Mutter, MD as Consulting Physician (Ophthalmology) Clarene Essex, MD as  Consulting Physician (Gastroenterology) [place], Jennefer Bravo, MD as Consulting Physician (Dermatology) Trula Slade, DPM as Consulting Physician (Podiatry) Roseanne Kaufman, MD as Consulting Physician (Orthopedic Surgery)  Indicate any recent Medical Services you may have received from other than [facility] providers in the past year (date may be approximate).     Assessment:   This is a routine wellness examination for [place].  Hearing/Vision screen Hearing Screening - Comments:: No issues Vision Screening - Comments:: Last eye exam-10/2020-Dr. McCuen  Dietary issues and exercise activities discussed: Current Exercise Habits: The patient does not participate in regular exercise at present, Exercise limited by: None identified   Goals Addressed             This Visit's Progress    DIET - INCREASE WATER INTAKE   On track    Drink at least 3 glasses of water per day.     Increase physical activity   Not on track    Start using your exercise machine for at least 25 minutes 2-3 days weekly.       Depression Screen PHQ 2/9 Scores 02/10/2021 10/02/2020 10/04/2019 09/20/2017 09/16/2016 01/01/2016 07/01/2015  PHQ - 2 Score 0 0 0 0 0 0 0    Fall Risk Fall Risk  02/10/2021 10/02/2020 10/04/2019 09/20/2017 09/16/2016  Falls in the past year? 0 0 0 No No  Number falls in past yr: 0 - 0 - -  Injury with Fall? 0 - 0 - -  Follow up Falls prevention discussed - Education provided;Falls prevention discussed - -    FALL RISK PREVENTION PERTAINING TO THE HOME:  Any stairs in or around the home? Yes  If so, are there any without handrails? No  Home free of loose throw rugs in walkways, pet beds, electrical cords,  etc? Yes  Adequate lighting in your home to reduce risk of falls? Yes   ASSISTIVE DEVICES UTILIZED TO PREVENT FALLS:  Life alert? No  Use of a cane, walker or w/c? No  Grab bars in the bathroom? Yes  Shower chair or bench in shower? Yes  Elevated toilet seat or a handicapped  toilet? Yes   TIMED UP AND GO:  Was the test performed? No . Phone visit   Cognitive Function:Normal cognitive status assessed by this Nurse Health Advisor. No abnormalities found.   MMSE - Mini Mental State Exam 09/16/2016  Orientation to time 5  Orientation to Place 5  Registration 3  Attention/ Calculation 5  Recall 3  Language- name 2 objects 2  Language- repeat 1  Language- follow 3 step command 3  Language- read & follow direction 1  Write a sentence 1  Copy design 1  Total score 30        Immunizations Immunization History  Administered Date(s) Administered   Fluad Quad(high Dose 65+) 12/14/2019, 12/29/2020   Hepatitis A 10/18/2003   Hepatitis B 10/18/2003, 11/29/2003   Influenza Split 12/14/2019   Influenza, High Dose Seasonal PF 12/19/2015, 12/28/2016, 12/12/2018   Influenza,inj,Quad PF,6+ Mos 01/10/2014, 12/26/2014   Influenza,inj,quad, With Preservative 12/28/2016   Influenza-Unspecified 11/19/2010, 12/03/2011, 12/12/2012   Mumps 01/29/2004   PFIZER Comirnaty(Gray Top)Covid-19 Tri-Sucrose Vaccine 07/11/2020   PFIZER(Purple Top)SARS-COV-2 Vaccination 04/19/2019, 05/14/2019, 01/12/2020   Pfizer Covid-19 Vaccine Bivalent Booster 58yrs & up 01/14/2021   Pneumococcal Conjugate-13 01/10/2014, 03/16/2015   Pneumococcal Polysaccharide-23 10/18/2003, 03/16/2015, 03/31/2015   Td 05/13/1997, 05/21/2009   Tdap 12/18/2015   Zoster Recombinat (Shingrix) 05/17/2017, 09/07/2017   Zoster, Live 06/06/2009    TDAP status: Up to date  Flu Vaccine status: Up to date  Pneumococcal vaccine status: Up to date  Covid-19 vaccine status: Completed vaccines  Qualifies for Shingles Vaccine? No   Zostavax completed Yes   Shingrix Completed?: Yes  Screening Tests Health Maintenance  Topic Date Due   FOOT EXAM  07/09/2020   HEMOGLOBIN A1C  07/02/2021   OPHTHALMOLOGY EXAM  11/10/2021   TETANUS/TDAP  12/17/2025   Pneumonia Vaccine 1+ Years old  Completed   INFLUENZA  VACCINE  Completed   COVID-19 Vaccine  Completed   Hepatitis C Screening  Completed   Zoster Vaccines- Shingrix  Completed   HPV VACCINES  Aged Out   COLONOSCOPY (Pts 45-25yrs Insurance coverage will need to be confirmed)  Discontinued    Health Maintenance  Health Maintenance Due  Topic Date Due   FOOT EXAM  07/09/2020    Colorectal cancer screening: No longer required.   Lung Cancer Screening: (Low Dose CT Chest recommended if Age 77-80 years, 30 pack-year currently smoking OR have quit w/in 15years.) does not qualify.     Additional Screening:  Hepatitis C Screening: Completed 03/26/2015  Vision Screening: Recommended annual ophthalmology exams for early detection of glaucoma and other disorders of the eye. Is the patient up to date with their annual eye exam?  Yes  Who is the provider or what is the name of the office in which the patient attends annual eye exams? Dr. Ellie Lunch   Dental Screening: Recommended annual dental exams for proper oral hygiene  Community Resource Referral / Chronic Care Management: CRR required this visit?  No   CCM required this visit?  No      Plan:     I have personally reviewed and noted the following in the patient's chart:   Medical  and social history Use of alcohol, tobacco or illicit drugs  Current medications and supplements including opioid prescriptions. Patient is not currently taking opioid prescriptions. Functional ability and status Nutritional status Physical activity Advanced directives List of other physicians Hospitalizations, surgeries, and ER visits in previous 12 months Vitals Screenings to include cognitive, depression, and falls Referrals and appointments  In addition, I have reviewed and discussed with patient certain preventive protocols, quality metrics, and best practice recommendations. A written personalized care plan for preventive services as well as general preventive health recommendations were  provided to patient.   Due to this being a telephonic visit, the after visit summary with patients personalized plan was offered to patient via mail or my-chart. Patient would like to access on my-chart.   Marta Antu, LPN   [id number]  Nurse Health Advisor  Nurse Notes: None

## 2021-02-22 DIAGNOSIS — G4733 Obstructive sleep apnea (adult) (pediatric): Secondary | ICD-10-CM | POA: Diagnosis not present

## 2021-03-30 ENCOUNTER — Ambulatory Visit: Payer: Medicare HMO | Admitting: Podiatry

## 2021-03-30 ENCOUNTER — Other Ambulatory Visit: Payer: Self-pay

## 2021-03-30 DIAGNOSIS — M79676 Pain in unspecified toe(s): Secondary | ICD-10-CM

## 2021-03-30 DIAGNOSIS — B351 Tinea unguium: Secondary | ICD-10-CM

## 2021-03-30 DIAGNOSIS — E1149 Type 2 diabetes mellitus with other diabetic neurological complication: Secondary | ICD-10-CM

## 2021-03-30 NOTE — Progress Notes (Signed)
Subjective: 76 y.o. returns the office today for painful, elongated, thickened toenails which he cannot trim himself. Denies any redness or drainage around the nails.  No open lesions he reports no new concerns today.  PCP: Mosie Lukes, MD - Last seen 01/08/2021 Endocrinologist: Dr. Benjiman Core, MD   Last A1c was 7.5 on 01/01/2021  Objective: AAO 3, NAD DP/PT pulses palpable, CRT less than 3 seconds Sensation is mildly decreased with SWM but overall no change.  Neuropathy symptoms are unchanged. Nails hypertrophic, dystrophic, elongated, brittle, discolored 10. There is tenderness overlying the nails 1-5 bilaterally. There is no surrounding erythema or drainage along the nail sites. Minimal hyperkeratotic tissue present left fifth toe.  No underlying ulceration drainage or any signs of infection present. No pain with calf compression, swelling, warmth, erythema.  Assessment: Patient presents with symptomatic onychomycosis  Plan: -Treatment options including alternatives, risks, complications were discussed -Nails sharply debrided 10 without complication/bleeding. -Continue to monitor neuropathy-since the symptoms are stable. -Discussed daily foot inspection. If there are any changes, to call the office immediately.  -Follow-up as scheduled or sooner if any problems are to arise. In the meantime, encouraged to call the office with any questions, concerns, changes symptoms.  Return in about 2 months (around 05/30/2021).  Celesta Gentile, DPM

## 2021-05-04 ENCOUNTER — Other Ambulatory Visit: Payer: Medicare HMO

## 2021-05-21 ENCOUNTER — Other Ambulatory Visit (INDEPENDENT_AMBULATORY_CARE_PROVIDER_SITE_OTHER): Payer: Medicare HMO

## 2021-05-21 DIAGNOSIS — I10 Essential (primary) hypertension: Secondary | ICD-10-CM

## 2021-05-21 DIAGNOSIS — E559 Vitamin D deficiency, unspecified: Secondary | ICD-10-CM | POA: Diagnosis not present

## 2021-05-21 DIAGNOSIS — E1142 Type 2 diabetes mellitus with diabetic polyneuropathy: Secondary | ICD-10-CM

## 2021-05-21 DIAGNOSIS — E782 Mixed hyperlipidemia: Secondary | ICD-10-CM

## 2021-05-21 LAB — COMPREHENSIVE METABOLIC PANEL
ALT: 19 U/L (ref 0–53)
AST: 19 U/L (ref 0–37)
Albumin: 4.2 g/dL (ref 3.5–5.2)
Alkaline Phosphatase: 82 U/L (ref 39–117)
BUN: 14 mg/dL (ref 6–23)
CO2: 28 mEq/L (ref 19–32)
Calcium: 8.9 mg/dL (ref 8.4–10.5)
Chloride: 104 mEq/L (ref 96–112)
Creatinine, Ser: 0.83 mg/dL (ref 0.40–1.50)
GFR: 84.78 mL/min (ref >60.00)
Glucose, Bld: 141 mg/dL — ABNORMAL HIGH (ref 70–99)
Potassium: 4.3 mEq/L (ref 3.5–5.1)
Sodium: 138 mEq/L (ref 135–145)
Total Bilirubin: 1 mg/dL (ref 0.2–1.2)
Total Protein: 6.7 g/dL (ref 6.0–8.3)

## 2021-05-21 LAB — LIPID PANEL
Cholesterol: 114 mg/dL (ref 0–200)
HDL: 36.4 mg/dL — ABNORMAL LOW (ref >39.00)
LDL Cholesterol: 59 mg/dL (ref 0–99)
NonHDL: 78.08
Total CHOL/HDL Ratio: 3
Triglycerides: 95 mg/dL (ref 0.0–149.0)
VLDL: 19 mg/dL (ref 0.0–40.0)

## 2021-05-21 LAB — CBC
HCT: 44.4 % (ref 39.0–52.0)
Hemoglobin: 14.9 g/dL (ref 13.0–17.0)
MCHC: 33.6 g/dL (ref 30.0–36.0)
MCV: 93.7 fl (ref 78.0–100.0)
Platelets: 147 10*3/uL — ABNORMAL LOW (ref 150.0–400.0)
RBC: 4.74 Mil/uL (ref 4.22–5.81)
RDW: 14.3 % (ref 11.5–15.5)
WBC: 5.8 10*3/uL (ref 4.0–10.5)

## 2021-05-21 LAB — VITAMIN D 25 HYDROXY (VIT D DEFICIENCY, FRACTURES): VITD: 36.14 ng/mL (ref 30.00–100.00)

## 2021-05-21 LAB — HEMOGLOBIN A1C: Hgb A1c MFr Bld: 7.7 % — ABNORMAL HIGH (ref 4.6–6.5)

## 2021-05-21 LAB — TSH: TSH: 2.91 u[IU]/mL (ref 0.35–5.50)

## 2021-05-28 ENCOUNTER — Ambulatory Visit (INDEPENDENT_AMBULATORY_CARE_PROVIDER_SITE_OTHER): Payer: Medicare HMO | Admitting: Family Medicine

## 2021-05-28 ENCOUNTER — Encounter: Payer: Self-pay | Admitting: Family Medicine

## 2021-05-28 DIAGNOSIS — E559 Vitamin D deficiency, unspecified: Secondary | ICD-10-CM | POA: Diagnosis not present

## 2021-05-28 DIAGNOSIS — R0981 Nasal congestion: Secondary | ICD-10-CM

## 2021-05-28 DIAGNOSIS — E782 Mixed hyperlipidemia: Secondary | ICD-10-CM

## 2021-05-28 DIAGNOSIS — Z77098 Contact with and (suspected) exposure to other hazardous, chiefly nonmedicinal, chemicals: Secondary | ICD-10-CM | POA: Diagnosis not present

## 2021-05-28 DIAGNOSIS — E1142 Type 2 diabetes mellitus with diabetic polyneuropathy: Secondary | ICD-10-CM | POA: Diagnosis not present

## 2021-05-28 DIAGNOSIS — E669 Obesity, unspecified: Secondary | ICD-10-CM

## 2021-05-28 DIAGNOSIS — I10 Essential (primary) hypertension: Secondary | ICD-10-CM

## 2021-05-28 MED ORDER — AMOXICILLIN 500 MG PO CAPS: 500.0000 mg | ORAL_CAPSULE | Freq: Three times a day (TID) | ORAL | 0 refills | Status: AC

## 2021-05-28 NOTE — Assessment & Plan Note (Signed)
Supplement and monitor 

## 2021-05-28 NOTE — Progress Notes (Addendum)
Subjective:    Patient ID: Michael Cunningham., male    DOB: 1945/04/14, 76 y.o.   MRN: [id number]  Chief Complaint  Patient presents with   Follow-up    HPI Patient is in today for a follow up on chronic conditions. No recent febrile illness or hospitalizations. No polyuria or polydipsia. Is trying to eat better and stay active. Continues to receive care at the [facility] frequently. Sugar is doing well. No polyuria or polydipsia.  Denies CP/palp/SOB/HA/congestion/fevers/GI or GU c/o. Taking meds as prescribed   Past Medical History:  Diagnosis Date   CAD (coronary artery disease)    a. 05/2014 [place] s/p DES x4 to mid-distLAD and DESx1 to DI   Complication of anesthesia    11'12 Surgery with excrutiating "head about to explode" upon awakening  -lasted several hours postop  ( SURGERY DONE 08-13-2011 AT [facility] PT DID OK POST OP)   Diverticulosis of colon    LEFT SIDE   Exposure to Agent [facility] 10/18/2016   [place]   History of small bowel obstruction    X2  28 YRS AGO AND LAST ONE 04/2014--  RESOLVED WITHOUT SURGICAL INTERVENTION   Hyperlipidemia    Hypertension    Knee pain, bilateral 08/13/2011   Left ureteral calculus    Multiple lipomas    hx. mulitple and some remains -arms, legs   Obesity, unspecified 09/18/2013   Obstructive sleep apnea 01/01/2016   Recurrent kidney stones 07/08/2013   Follows with Dr Patsi Sears He believes they are Calcium oxalate stones   Thrombocytopenia (HCC) 07/07/2014   Type 2 diabetes mellitus (HCC)    Vitamin D deficiency 07/02/2014    Past Surgical History:  Procedure Laterality Date   COLONOSCOPY W/ POLYPECTOMY  LAST ONE 2013   CORONARY STENT PLACEMENT  05/17/2014   PROXIMAL X 4  MID & DISTAL LAD  DIAGONAL    KNEE ARTHROSCOPY  08/13/2011   right ARTHROSCOPY KNEE;  Surgeon: Loanne Drilling, MD;  Location: [facility] ORS;  Service: Orthopedics;  Laterality: Right;  medial  meniscal debridement, excision of plica, synovectomy   KNEE ARTHROSCOPY Left 11/16/2013   Procedure:  LEFT KNEE ARTHROSCOPY MEDIAL MENISCAL DEBRIDEMENT, CHONDROPLASTY;  Surgeon: Loanne Drilling, MD;  Location: [facility] ORS;  Service: Orthopedics;  Laterality: Left;   KNEE ARTHROSCOPY W/ MENISCECTOMY Right 01-29-2011   twice, first trimmed mediscus and cyst   LEFT HEART CATHETERIZATION WITH CORONARY ANGIOGRAM N/A 05/17/2014   Procedure: LEFT HEART CATHETERIZATION WITH CORONARY ANGIOGRAM;  Surgeon: Kathleene Hazel, MD;  Location: [facility] CATH LAB;  Service: Cardiovascular;  Laterality: N/A;   LIPOMA EXCISION  1975   multiple, 10-12   PERCUTANEOUS CORONARY STENT INTERVENTION (PCI-S)  05/17/2014   Procedure: PERCUTANEOUS CORONARY STENT INTERVENTION (PCI-S);  Surgeon: Kathleene Hazel, MD;  Location: [facility] CATH LAB;  Service: Cardiovascular;;  Diag1 and Prox to Distal LAD    Family History  Problem Relation Age of Onset   Hypertension Mother    Cancer Mother 83       ovarian   Heart disease Father        CHF   Heart attack Father 2   Cancer Sister        breast- just finished her last radiation   Heart disease Maternal Grandfather        ?   Heart attack Paternal Grandfather 29   Heart attack Paternal Uncle 19       Sudden death   Diabetes Neg Hx  Social History   Socioeconomic History   Marital status: Married    Spouse name: Not on file   Number of children: 2   Years of education: Not on file   Highest education level: Not on file  Occupational History   Not on file  Tobacco Use   Smoking status: Former    Packs/day: 1.00    Years: 4.00    Pack years: 4.00    Types: Cigarettes    Quit date: 06/14/1967    Years since quitting: 54.0   Smokeless tobacco: Never  Substance and Sexual Activity   Alcohol use: No    Alcohol/week: 0.0 standard drinks   Drug use: No   Sexual activity: Yes    Comment: lives with wife  Other Topics Concern   Not on file  Social History Narrative   Retired International aid/development worker improvement.  Lives with wife.     Social Determinants of Health    Financial Resource Strain: Low Risk    Difficulty of Paying Living Expenses: Not hard at all  Food Insecurity: No Food Insecurity   Worried About Programme researcher, broadcasting/film/video in the Last Year: Never true   Ran Out of Food in the Last Year: Never true  Transportation Needs: No Transportation Needs   Lack of Transportation (Medical): No   Lack of Transportation (Non-Medical): No  Physical Activity: Inactive   Days of Exercise per Week: 0 days   Minutes of Exercise per Session: 0 min  Stress: No Stress Concern Present   Feeling of Stress : Not at all  Social Connections: Socially Integrated   Frequency of Communication with Friends and Family: Not on file   Frequency of Social Gatherings with Friends and Family: More than three times a week   Attends Religious Services: More than 4 times per year   Active Member of [employer] or Organizations: Yes   Attends Engineer, structural: More than 4 times per year   Marital Status: Married  Catering manager Violence: Not on file    Outpatient Medications Prior to Visit  Medication Sig Dispense Refill   Accu-Chek FastClix Lancets MISC Use to test blood sugar 2 times daily. Dx: E11.40 204 each 5   acetaminophen (TYLENOL) 500 MG tablet Take 1,000 mg by mouth every 6 (six) hours as needed for mild pain. Reported on 03/31/2015     aspirin EC 81 MG tablet Take 81 mg by mouth daily with breakfast.      Cholecalciferol 50 MCG (2000 UT) TABS Take 2 tablets by mouth daily.     clotrimazole-betamethasone (LOTRISONE) cream Apply 1 application topically 2 (two) times daily. 30 g 0   COVID-19 mRNA bivalent vaccine, Pfizer, (PFIZER COVID-19 VAC BIVALENT) injection Inject into the muscle. 0.3 mL 0   COVID-19 mRNA Vac-TriS, Pfizer, (PFIZER-BIONT COVID-19 VAC-TRIS) SUSP injection Inject into the muscle. 0.3 mL 0   glucagon (GLUCAGON EMERGENCY) 1 MG injection Inject 1 mg into the muscle once as needed for up to 1 dose. 1 each 12   glucose-Vitamin C 4-0.006 GM CHEW  chewable tablet CHEW FOUR TABLETS BY MOUTH  AS NEEDED (REPEAT EVERY 15 MINUTES IF BLOOD SUGAR LESS THAN 70)     insulin aspart (NOVOLOG) 100 UNIT/ML injection Inject 15 Units into the skin 3 (three) times daily before meals. Inject in skin before meals     insulin glargine (LANTUS) 100 UNIT/ML injection Inject 40-50 Units into the skin daily.     losartan (COZAAR) 100 MG tablet Take 1  tablet (100 mg total) by mouth daily. (Patient taking differently: Take 75 mg by mouth daily.) 90 tablet 0   metoprolol tartrate (LOPRESSOR) 25 MG tablet Take 1 tablet (25 mg total) by mouth 2 (two) times daily. 180 tablet 3   Multiple Vitamin (MULTIVITAMIN WITH MINERALS) TABS tablet Take 1 tablet by mouth daily.     nitroGLYCERIN (NITROSTAT) 0.4 MG SL tablet Place 1 tablet (0.4 mg total) under the tongue every 5 (five) minutes as needed for chest pain. 25 tablet 3   prasugrel (EFFIENT) 10 MG TABS tablet Take 1 tablet (10 mg total) by mouth daily. 90 tablet 3   simvastatin (ZOCOR) 40 MG tablet TAKE 1 TABLET (40 MG) BY MOUTH AT BEDTIME. 90 tablet 2   cetirizine (ZYRTEC) 10 MG tablet TAKE 1 TABLET BY MOUTH EVERY MORNING 100 tablet 3   No facility-administered medications prior to visit.    Allergies  Allergen Reactions   Ciprofloxacin Other (See Comments)    Abdominal pain   Lipitor [Atorvastatin Calcium] Other (See Comments)    Severe muscle pain   Lisinopril Cough   Metformin And Related Diarrhea    Review of Systems  Constitutional:  Negative for fever and malaise/fatigue.  HENT:  Negative for congestion.   Eyes:  Negative for blurred vision.  Respiratory:  Negative for shortness of breath.   Cardiovascular:  Negative for chest pain, palpitations and leg swelling.  Gastrointestinal:  Negative for abdominal pain, blood in stool and nausea.  Genitourinary:  Negative for dysuria and frequency.  Musculoskeletal:  Negative for falls.  Skin:  Negative for rash.  Neurological:  Negative for dizziness, loss  of consciousness and headaches.  Endo/Heme/Allergies:  Negative for environmental allergies.  Psychiatric/Behavioral:  Negative for depression. The patient is not nervous/anxious.       Objective:    Physical Exam Constitutional:      General: He is not in acute distress.    Appearance: Normal appearance. He is not ill-appearing or toxic-appearing.  HENT:     Head: Normocephalic and atraumatic.     Right Ear: External ear normal.     Left Ear: External ear normal.     Nose: Nose normal.  Eyes:     General:        Right eye: No discharge.        Left eye: No discharge.  Pulmonary:     Effort: Pulmonary effort is normal.  Abdominal:     General: Bowel sounds are normal.     Palpations: Abdomen is soft.  Skin:    Findings: No rash.  Neurological:     Mental Status: He is alert and oriented to person, place, and time.  Psychiatric:        Behavior: Behavior normal.    BP 124/64 (BP Location: Left Arm, Patient Position: Sitting, Cuff Size: Large)   Pulse (!) 44   Resp 20   Ht 5\' 11"  (1.803 m)   Wt 245 lb 6.4 oz (111.3 kg)   SpO2 98%   BMI 34.23 kg/m  Wt Readings from Last 3 Encounters:  05/28/21 245 lb 6.4 oz (111.3 kg)  02/10/21 245 lb (111.1 kg)  01/30/21 245 lb 3.2 oz (111.2 kg)    Diabetic Foot Exam - Simple   No data filed    Lab Results  Component Value Date   WBC 5.8 05/21/2021   HGB 14.9 05/21/2021   HCT 44.4 05/21/2021   PLT 147.0 (L) 05/21/2021   GLUCOSE 141 (H) 05/21/2021  CHOL 114 05/21/2021   TRIG 95.0 05/21/2021   HDL 36.40 (L) 05/21/2021   LDLDIRECT 108.0 04/16/2014   LDLCALC 59 05/21/2021   ALT 19 05/21/2021   AST 19 05/21/2021   NA 138 05/21/2021   K 4.3 05/21/2021   CL 104 05/21/2021   CREATININE 0.83 05/21/2021   BUN 14 05/21/2021   CO2 28 05/21/2021   TSH 2.91 05/21/2021   PSA 1.92 09/25/2018   INR 1.02 05/17/2014   HGBA1C 7.7 (H) 05/21/2021   MICROALBUR 1.7 06/24/2015    Lab Results  Component Value Date   TSH 2.91  05/21/2021   Lab Results  Component Value Date   WBC 5.8 05/21/2021   HGB 14.9 05/21/2021   HCT 44.4 05/21/2021   MCV 93.7 05/21/2021   PLT 147.0 (L) 05/21/2021   Lab Results  Component Value Date   NA 138 05/21/2021   K 4.3 05/21/2021   CO2 28 05/21/2021   GLUCOSE 141 (H) 05/21/2021   BUN 14 05/21/2021   CREATININE 0.83 05/21/2021   BILITOT 1.0 05/21/2021   ALKPHOS 82 05/21/2021   AST 19 05/21/2021   ALT 19 05/21/2021   PROT 6.7 05/21/2021   ALBUMIN 4.2 05/21/2021   CALCIUM 8.9 05/21/2021   ANIONGAP 8 05/18/2014   GFR 84.78 05/21/2021   Lab Results  Component Value Date   CHOL 114 05/21/2021   Lab Results  Component Value Date   HDL 36.40 (L) 05/21/2021   Lab Results  Component Value Date   LDLCALC 59 05/21/2021   Lab Results  Component Value Date   TRIG 95.0 05/21/2021   Lab Results  Component Value Date   CHOLHDL 3 05/21/2021   Lab Results  Component Value Date   HGBA1C 7.7 (H) 05/21/2021       Assessment & Plan:   Problem List Items Addressed This Visit     Hypertension (Chronic)    Well controlled, no changes to meds. Encouraged heart healthy diet such as the DASH diet and exercise as tolerated.       Relevant Orders   CBC   Comprehensive metabolic panel   TSH   Hyperlipidemia    Encourage heart healthy diet such as MIND or DASH diet, increase exercise, avoid trans fats, simple carbohydrates and processed foods, consider a krill or fish or flaxseed oil cap daily.       Relevant Orders   Lipid panel   Vitamin D deficiency    Supplement and monitor      Relevant Orders   VITAMIN D 25 Hydroxy (Vit-D Deficiency, Fractures)   Well controlled type 2 diabetes mellitus with peripheral neuropathy (HCC)    hgba1c acceptable, minimize simple carbs. Increase exercise as tolerated. Continue current meds      Relevant Orders   Hemoglobin A1c   [facility] [employer] [facility]      Obesity (BMI 30-39.9)    Encouraged DASH  or MIND diet, decrease po intake and increase exercise as tolerated. Needs 7-8 hours of sleep nightly. Avoid trans fats, eat small, frequent meals every 4-5 hours with lean proteins, complex carbs and healthy fats. Minimize simple carbs, high fat foods and processed foods      Head congestion    With eustachian tube dysfunction. Continue flonase, add a valsalva maneuver and Zyrtec. F/u with ENT at [facility] if persists.       I am having Rayburn Ma. Nyce Jr. start on amoxicillin. I am also having  him maintain his aspirin EC, multivitamin with minerals, acetaminophen, metoprolol tartrate, simvastatin, glucagon, prasugrel, nitroGLYCERIN, losartan, cetirizine, Pfizer-BioNT COVID-19 Vac-TriS, Cholecalciferol, Accu-Chek FastClix Lancets, glucose-Vitamin C, clotrimazole-betamethasone, insulin aspart, insulin glargine, and Pfizer COVID-19 Vac Bivalent.  Meds ordered this encounter  Medications   amoxicillin (AMOXIL) 500 MG capsule    Sig: Take 1 capsule (500 mg total) by mouth 3 (three) times daily for 10 days.    Dispense:  30 capsule    Refill:  0

## 2021-05-28 NOTE — Assessment & Plan Note (Signed)
[facility] 

## 2021-05-28 NOTE — Assessment & Plan Note (Signed)
hgba1c acceptable, minimize simple carbs. Increase exercise as tolerated. Continue current meds 

## 2021-05-28 NOTE — Assessment & Plan Note (Signed)
Encourage heart healthy diet such as MIND or DASH diet, increase exercise, avoid trans fats, simple carbohydrates and processed foods, consider a krill or fish or flaxseed oil cap daily.  °

## 2021-05-28 NOTE — Assessment & Plan Note (Signed)
Encouraged DASH or MIND diet, decrease po intake and increase exercise as tolerated. Needs 7-8 hours of sleep nightly. Avoid trans fats, eat small, frequent meals every 4-5 hours with lean proteins, complex carbs and healthy fats. Minimize simple carbs, high fat foods and processed foods 

## 2021-05-28 NOTE — Assessment & Plan Note (Signed)
Well controlled, no changes to meds. Encouraged heart healthy diet such as the DASH diet and exercise as tolerated.  °

## 2021-05-28 NOTE — Patient Instructions (Signed)
Mediterranean Diet °A Mediterranean diet refers to food and lifestyle choices that are based on the traditions of countries located on the [place] Sea. It focuses on eating more fruits, vegetables, whole grains, beans, nuts, seeds, and heart-healthy fats, and eating less dairy, meat, eggs, and processed foods with added sugar, salt, and fat. This way of eating has been shown to help prevent certain conditions and improve outcomes for people who have chronic diseases, like kidney disease and heart disease. °What are tips for following this plan? °Reading food labels °Check the serving size of packaged foods. For foods such as rice and pasta, the serving size refers to the amount of cooked product, not dry. °Check the total fat in packaged foods. Avoid foods that have saturated fat or trans fats. °Check the ingredient list for added sugars, such as corn syrup. °Shopping ° °Buy a variety of foods that offer a balanced diet, including: °Fresh fruits and vegetables (produce). °Grains, beans, nuts, and seeds. Some of these may be available in unpackaged forms or large amounts (in bulk). °Fresh seafood. °Poultry and eggs. °Low-fat dairy products. °Buy whole ingredients instead of prepackaged foods. °Buy fresh fruits and vegetables in-season from local farmers markets. °Buy plain frozen fruits and vegetables. °If you do not have access to quality fresh seafood, buy precooked frozen shrimp or canned fish, such as tuna, salmon, or sardines. °Stock your pantry so you always have certain foods on hand, such as olive oil, canned tuna, canned tomatoes, rice, pasta, and beans. °Cooking °Cook foods with extra-virgin olive oil instead of using butter or other vegetable oils. °Have meat as a side dish, and have vegetables or grains as your main dish. This means having meat in small portions or adding small amounts of meat to foods like pasta or stew. °Use beans or vegetables instead of meat in common dishes like chili or  lasagna. °Experiment with different cooking methods. Try roasting, broiling, steaming, and sautéing vegetables. °Add frozen vegetables to soups, stews, pasta, or rice. °Add nuts or seeds for added healthy fats and plant protein at each meal. You can add these to yogurt, salads, or vegetable dishes. °Marinate fish or vegetables using olive oil, lemon juice, garlic, and fresh herbs. °Meal planning °Plan to eat one vegetarian meal one day each week. Try to work up to two vegetarian meals, if possible. °Eat seafood two or more times a week. °Have healthy snacks readily available, such as: °Vegetable sticks with hummus. °Greek yogurt. °Fruit and nut trail mix. °Eat balanced meals throughout the week. This includes: °Fruit: 2-3 servings a day. °Vegetables: 4-5 servings a day. °Low-fat dairy: 2 servings a day. °Fish, poultry, or lean meat: 1 serving a day. °Beans and legumes: 2 or more servings a week. °Nuts and seeds: 1-2 servings a day. °Whole grains: 6-8 servings a day. °Extra-virgin olive oil: 3-4 servings a day. °Limit red meat and sweets to only a few servings a month. °Lifestyle ° °Cook and eat meals together with your family, when possible. °Drink enough fluid to keep your urine pale yellow. °Be physically active every day. This includes: °Aerobic exercise like running or swimming. °Leisure activities like gardening, walking, or housework. °Get 7-8 hours of sleep each night. °If recommended by your health care provider, drink red wine in moderation. This means 1 glass a day for nonpregnant women and 2 glasses a day for men. A glass of wine equals 5 oz (150 mL). °What foods should I eat? °Fruits °Apples. Apricots. Avocado. Berries. Bananas. Cherries. Dates.   Figs. Grapes. Lemons. Melon. Oranges. Peaches. Plums. Pomegranate. °Vegetables °Artichokes. Beets. Broccoli. Cabbage. Carrots. Eggplant. Green beans. Chard. Kale. Spinach. Onions. Leeks. Peas. Squash. Tomatoes. Peppers. Radishes. °Grains °Whole-grain pasta. Brown  rice. Bulgur wheat. Polenta. Couscous. Whole-wheat bread. Oatmeal. Quinoa. °Meats and other proteins °Beans. Almonds. Sunflower seeds. Pine nuts. Peanuts. Cod. Salmon. Scallops. Shrimp. Tuna. Tilapia. Clams. Oysters. Eggs. Poultry without skin. °Dairy °Low-fat milk. Cheese. Greek yogurt. °Fats and oils °Extra-virgin olive oil. Avocado oil. Grapeseed oil. °Beverages °Water. Red wine. Herbal tea. °Sweets and desserts °Greek yogurt with honey. Baked apples. Poached pears. Trail mix. °Seasonings and condiments °Basil. Cilantro. Coriander. Cumin. Mint. Parsley. Sage. Rosemary. Tarragon. Garlic. Oregano. Thyme. Pepper. Balsamic vinegar. Tahini. Hummus. Tomato sauce. Olives. Mushrooms. °The items listed above may not be a complete list of foods and beverages you can eat. Contact a dietitian for more information. °What foods should I limit? °This is a list of foods that should be eaten rarely or only on special occasions. °Fruits °Fruit canned in syrup. °Vegetables °Deep-fried potatoes (french fries). °Grains °Prepackaged pasta or rice dishes. Prepackaged cereal with added sugar. Prepackaged snacks with added sugar. °Meats and other proteins °Beef. Pork. Lamb. Poultry with skin. Hot dogs. Bacon. °Dairy °Ice cream. Sour cream. Whole milk. °Fats and oils °Butter. Canola oil. Vegetable oil. Beef fat (tallow). Lard. °Beverages °Juice. Sugar-sweetened soft drinks. Beer. Liquor and spirits. °Sweets and desserts °Cookies. Cakes. Pies. Candy. °Seasonings and condiments °Mayonnaise. Pre-made sauces and marinades. °The items listed above may not be a complete list of foods and beverages you should limit. Contact a dietitian for more information. °Summary °The Mediterranean diet includes both food and lifestyle choices. °Eat a variety of fresh fruits and vegetables, beans, nuts, seeds, and whole grains. °Limit the amount of red meat and sweets that you eat. °If recommended by your health care provider, drink red wine in moderation.  This means 1 glass a day for nonpregnant women and 2 glasses a day for men. A glass of wine equals 5 oz (150 mL). °This information is not intended to replace advice given to you by your health care provider. Make sure you discuss any questions you have with your health care provider. °Document Revised: 04/06/2019 Document Reviewed: 02/01/2019 °Elsevier Patient Education © 2022 Elsevier Inc. ° °

## 2021-05-29 DIAGNOSIS — G4733 Obstructive sleep apnea (adult) (pediatric): Secondary | ICD-10-CM | POA: Diagnosis not present

## 2021-05-31 NOTE — Assessment & Plan Note (Addendum)
Consider flonase, add a valsalva maneuver and Zyrtec.  ?

## 2021-06-01 ENCOUNTER — Other Ambulatory Visit: Payer: Self-pay

## 2021-06-01 ENCOUNTER — Ambulatory Visit: Payer: Medicare HMO | Admitting: Podiatry

## 2021-06-01 DIAGNOSIS — M79676 Pain in unspecified toe(s): Secondary | ICD-10-CM

## 2021-06-01 DIAGNOSIS — E1149 Type 2 diabetes mellitus with other diabetic neurological complication: Secondary | ICD-10-CM

## 2021-06-01 DIAGNOSIS — D689 Coagulation defect, unspecified: Secondary | ICD-10-CM | POA: Diagnosis not present

## 2021-06-01 DIAGNOSIS — B351 Tinea unguium: Secondary | ICD-10-CM | POA: Diagnosis not present

## 2021-06-01 NOTE — Progress Notes (Signed)
Subjective: ?76 y.o. returns the office today for painful, elongated, thickened toenails which he cannot trim himself. Denies any redness or drainage around the nails.  No open lesions he reports no new concerns today. ? ?PCP: Mosie Lukes, MD  ?Endocrinologist: Dr. Benjiman Core, MD  ? ?Last A1c was 7.7 on 05/21/2021 ? ?Objective: ?AAO ?3, NAD ?DP/PT pulses palpable, CRT less than 3 seconds ?Sensation is mildly decreased with SWM but overall no change.  Neuropathy symptoms are unchanged. ?Nails hypertrophic, dystrophic, elongated, brittle, discolored ?10. There is tenderness overlying the nails 1-5 bilaterally. There is no surrounding erythema or drainage along the nail sites. ?No pain with calf compression, swelling, warmth, erythema. ? ?Assessment: ?Patient presents with symptomatic onychomycosis ? ?Plan: ?-Treatment options including alternatives, risks, complications were discussed ?-Nails sharply debrided ?10 without complication/bleeding. ?-Continue to monitor neuropathy-since the symptoms are stable. ?-Discussed daily foot inspection. If there are any changes, to call the office immediately.  ?-Follow-up as scheduled or sooner if any problems are to arise. In the meantime, encouraged to call the office with any questions, concerns, changes symptoms. ? ?Return in about 2 months (around 05/30/2021). ? ?Celesta Gentile, DPM ? ?

## 2021-06-29 DIAGNOSIS — G4733 Obstructive sleep apnea (adult) (pediatric): Secondary | ICD-10-CM | POA: Diagnosis not present

## 2021-07-20 ENCOUNTER — Other Ambulatory Visit (HOSPITAL_BASED_OUTPATIENT_CLINIC_OR_DEPARTMENT_OTHER): Payer: Self-pay

## 2021-08-03 ENCOUNTER — Ambulatory Visit: Payer: Medicare HMO | Admitting: Podiatry

## 2021-08-03 DIAGNOSIS — E1149 Type 2 diabetes mellitus with other diabetic neurological complication: Secondary | ICD-10-CM | POA: Diagnosis not present

## 2021-08-03 DIAGNOSIS — D689 Coagulation defect, unspecified: Secondary | ICD-10-CM

## 2021-08-03 DIAGNOSIS — M79676 Pain in unspecified toe(s): Secondary | ICD-10-CM

## 2021-08-03 DIAGNOSIS — B351 Tinea unguium: Secondary | ICD-10-CM

## 2021-08-05 NOTE — Progress Notes (Signed)
Subjective: 76 y.o. returns the office today for painful, elongated, thickened toenails which he cannot trim himself. Denies any redness or drainage around the nails.  No open lesions he reports no new concerns today.  PCP: Mosie Lukes, MD -last seen May 28, 2021 Endocrinologist: Dr. Benjiman Core, MD   Last A1c was 7.7 on 05/21/2021  Objective: AAO 3, NAD DP/PT pulses palpable, CRT less than 3 seconds Sensation is mildly decreased with SWM but overall no change.  Neuropathy symptoms are unchanged. Nails hypertrophic, dystrophic, elongated, brittle, discolored 10. There is tenderness overlying the nails 1-5 bilaterally. There is no surrounding erythema or drainage along the nail sites. No pain with calf compression, swelling, warmth, erythema.  Assessment: Patient presents with symptomatic onychomycosis  Plan: -Treatment options including alternatives, risks, complications were discussed -Nails sharply debrided 10 without complication/bleeding. -Continue to monitor neuropathy-since the symptoms are stable. -Discussed daily foot inspection. If there are any changes, to call the office immediately.  -Follow-up as scheduled or sooner if any problems are to arise. In the meantime, encouraged to call the office with any questions, concerns, changes symptoms.  Return in about 2 months (around 10/03/2021).   Celesta Gentile, DPM

## 2021-09-24 ENCOUNTER — Other Ambulatory Visit (INDEPENDENT_AMBULATORY_CARE_PROVIDER_SITE_OTHER): Payer: Medicare HMO

## 2021-09-24 DIAGNOSIS — I10 Essential (primary) hypertension: Secondary | ICD-10-CM | POA: Diagnosis not present

## 2021-09-24 DIAGNOSIS — E1142 Type 2 diabetes mellitus with diabetic polyneuropathy: Secondary | ICD-10-CM

## 2021-09-24 DIAGNOSIS — E559 Vitamin D deficiency, unspecified: Secondary | ICD-10-CM | POA: Diagnosis not present

## 2021-09-24 DIAGNOSIS — E782 Mixed hyperlipidemia: Secondary | ICD-10-CM | POA: Diagnosis not present

## 2021-09-24 LAB — COMPREHENSIVE METABOLIC PANEL
ALT: 23 U/L (ref 0–53)
AST: 22 U/L (ref 0–37)
Albumin: 4.3 g/dL (ref 3.5–5.2)
Alkaline Phosphatase: 78 U/L (ref 39–117)
BUN: 19 mg/dL (ref 6–23)
CO2: 29 mEq/L (ref 19–32)
Calcium: 9 mg/dL (ref 8.4–10.5)
Chloride: 103 mEq/L (ref 96–112)
Creatinine, Ser: 0.98 mg/dL (ref 0.40–1.50)
GFR: 74.52 mL/min (ref >60.00)
Glucose, Bld: 147 mg/dL — ABNORMAL HIGH (ref 70–99)
Potassium: 4.2 mEq/L (ref 3.5–5.1)
Sodium: 137 mEq/L (ref 135–145)
Total Bilirubin: 0.8 mg/dL (ref 0.2–1.2)
Total Protein: 6.9 g/dL (ref 6.0–8.3)

## 2021-09-24 LAB — LIPID PANEL
Cholesterol: 122 mg/dL (ref 0–200)
HDL: 35.2 mg/dL — ABNORMAL LOW (ref >39.00)
LDL Cholesterol: 64 mg/dL (ref 0–99)
NonHDL: 87.27
Total CHOL/HDL Ratio: 3
Triglycerides: 114 mg/dL (ref 0.0–149.0)
VLDL: 22.8 mg/dL (ref 0.0–40.0)

## 2021-09-24 LAB — CBC
HCT: 43.9 % (ref 39.0–52.0)
Hemoglobin: 14.8 g/dL (ref 13.0–17.0)
MCHC: 33.7 g/dL (ref 30.0–36.0)
MCV: 94.1 fl (ref 78.0–100.0)
Platelets: 134 10*3/uL — ABNORMAL LOW (ref 150.0–400.0)
RBC: 4.66 Mil/uL (ref 4.22–5.81)
RDW: 14.5 % (ref 11.5–15.5)
WBC: 5.4 10*3/uL (ref 4.0–10.5)

## 2021-09-24 LAB — VITAMIN D 25 HYDROXY (VIT D DEFICIENCY, FRACTURES): VITD: 45.11 ng/mL (ref 30.00–100.00)

## 2021-09-24 LAB — HEMOGLOBIN A1C: Hgb A1c MFr Bld: 8 % — ABNORMAL HIGH (ref 4.6–6.5)

## 2021-09-24 LAB — TSH: TSH: 3.19 u[IU]/mL (ref 0.35–5.50)

## 2021-09-30 NOTE — Progress Notes (Deleted)
Subjective:    Patient ID: Michael Gavel., male    DOB: Jan 07, 1946, 75 y.o.   MRN: [id number]  No chief complaint on file.   HPI Patient is in today for a follow up.  Past Medical History:  Diagnosis Date   CAD (coronary artery disease)    a. 05/2014 [place] s/p DES x4 to mid-distLAD and DESx1 to DI   Complication of anesthesia    11'12 Surgery with excrutiating "head about to explode" upon awakening  -lasted several hours postop  ( SURGERY DONE 08-13-2011 AT [facility] PT DID OK POST OP)   Diverticulosis of colon    LEFT SIDE   Exposure to Agent [facility] 10/18/2016   [place]   History of small bowel obstruction    X2  28 YRS AGO AND LAST ONE 04/2014--  RESOLVED WITHOUT SURGICAL INTERVENTION   Hyperlipidemia    Hypertension    Knee pain, bilateral 08/13/2011   Left ureteral calculus    Multiple lipomas    hx. mulitple and some remains -arms, legs   Obesity, unspecified 09/18/2013   Obstructive sleep apnea 01/01/2016   Recurrent kidney stones 07/08/2013   Follows with Dr Gaynelle Arabian He believes they are Calcium oxalate stones   Thrombocytopenia ([place]) 07/07/2014   Type 2 diabetes mellitus ([place])    Vitamin D deficiency 07/02/2014    Past Surgical History:  Procedure Laterality Date   COLONOSCOPY W/ POLYPECTOMY  LAST ONE 2013   CORONARY STENT PLACEMENT  05/17/2014   PROXIMAL X 4  MID & DISTAL LAD  DIAGONAL    KNEE ARTHROSCOPY  08/13/2011   right ARTHROSCOPY KNEE;  Surgeon: Gearlean Alf, MD;  Location: [facility];  Service: Orthopedics;  Laterality: Right;  medial  meniscal debridement, excision of plica, synovectomy   KNEE ARTHROSCOPY Left 11/16/2013   Procedure: LEFT KNEE ARTHROSCOPY MEDIAL MENISCAL DEBRIDEMENT, CHONDROPLASTY;  Surgeon: Gearlean Alf, MD;  Location: [facility];  Service: Orthopedics;  Laterality: Left;   KNEE ARTHROSCOPY W/ MENISCECTOMY Right 01-29-2011   twice, first trimmed mediscus and cyst   LEFT HEART CATHETERIZATION WITH CORONARY ANGIOGRAM N/A 05/17/2014   Procedure: LEFT  HEART CATHETERIZATION WITH CORONARY ANGIOGRAM;  Surgeon: Burnell Blanks, MD;  Location: [facility] CATH LAB;  Service: Cardiovascular;  Laterality: N/A;   LIPOMA EXCISION  1975   multiple, 10-12   PERCUTANEOUS CORONARY STENT INTERVENTION (PCI-S)  05/17/2014   Procedure: PERCUTANEOUS CORONARY STENT INTERVENTION (PCI-S);  Surgeon: Burnell Blanks, MD;  Location: [facility] CATH LAB;  Service: Cardiovascular;;  Diag1 and Prox to Distal LAD    Family History  Problem Relation Age of Onset   Hypertension Mother    Cancer Mother 30       ovarian   Heart disease Father        CHF   Heart attack Father 23   Cancer Sister        breast- just finished her last radiation   Heart disease Maternal Grandfather        ?   Heart attack Paternal Grandfather 61   Heart attack Paternal Uncle 47       Sudden death   Diabetes Neg Hx     Social History   Socioeconomic History   Marital status: Married    Spouse name: Not on file   Number of children: 2   Years of education: Not on file   Highest education level: Not on file  Occupational History   Not on file  Tobacco Use  Smoking status: Former    Packs/day: 1.00    Years: 4.00    Total pack years: 4.00    Types: Cigarettes    Quit date: 06/14/1967    Years since quitting: 54.3   Smokeless tobacco: Never  Substance and Sexual Activity   Alcohol use: No    Alcohol/week: 0.0 standard drinks of alcohol   Drug use: No   Sexual activity: Yes    Comment: lives with wife  Other Topics Concern   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     Social Determinants of Health   Financial Resource Strain: Low Risk  (02/10/2021)   Overall Financial Resource Strain (CARDIA)    Difficulty of Paying Living Expenses: Not hard at all  Food Insecurity: No Food Insecurity (02/10/2021)   Hunger Vital Sign    Worried About Running Out of Food in the Last Year: Never true    Ran Out of Food in the Last  Year: Never true  Transportation Needs: No Transportation Needs (02/10/2021)   PRAPARE - Hydrologist (Medical): No    Lack of Transportation (Non-Medical): No  Physical Activity: Inactive (02/10/2021)   Exercise Vital Sign    Days of Exercise per Week: 0 days    Minutes of Exercise per Session: 0 min  Stress: No Stress Concern Present (02/10/2021)   [place]    Feeling of Stress : Not at all  Social Connections: [employer] (02/10/2021)   Social Connection and Isolation Panel [NHANES]    Frequency of Communication with Friends and Family: Not on file    Frequency of Social Gatherings with Friends and Family: More than three times a week    Attends Religious Services: More than 4 times per year    Active Member of [employer] or Organizations: Yes    Attends Music therapist: More than 4 times per year    Marital Status: Married  Human resources officer Violence: Not on file    Outpatient Medications Prior to Visit  Medication Sig Dispense Refill   Accu-Chek FastClix Lancets MISC Use to test blood sugar 2 times daily. Dx: E11.40 204 each 5   acetaminophen (TYLENOL) 500 MG tablet Take 1,000 mg by mouth every 6 (six) hours as needed for mild pain. Reported on 03/31/2015     aspirin EC 81 MG tablet Take 81 mg by mouth daily with breakfast.      cetirizine (ZYRTEC) 10 MG tablet TAKE 1 TABLET BY MOUTH EVERY MORNING 100 tablet 3   Cholecalciferol 50 MCG (2000 UT) TABS Take 2 tablets by mouth daily.     clotrimazole-betamethasone (LOTRISONE) cream Apply 1 application topically 2 (two) times daily. 30 g 0   COVID-19 mRNA bivalent vaccine, Pfizer, (PFIZER COVID-19 VAC BIVALENT) injection Inject into the muscle. 0.3 mL 0   COVID-19 mRNA Vac-TriS, Pfizer, (PFIZER-BIONT COVID-19 VAC-TRIS) SUSP injection Inject into the muscle. 0.3 mL 0   glucagon (GLUCAGON EMERGENCY) 1 MG injection Inject 1 mg  into the muscle once as needed for up to 1 dose. 1 each 12   glucose-Vitamin C 4-0.006 GM CHEW chewable tablet CHEW FOUR TABLETS BY MOUTH  AS NEEDED (REPEAT EVERY 15 MINUTES IF BLOOD SUGAR LESS THAN 70)     insulin aspart (NOVOLOG) 100 UNIT/ML injection Inject 15 Units into the skin 3 (three) times daily before meals. Inject in skin before meals  insulin glargine (LANTUS) 100 UNIT/ML injection Inject 40-50 Units into the skin daily.     losartan (COZAAR) 100 MG tablet Take 1 tablet (100 mg total) by mouth daily. (Patient taking differently: Take 75 mg by mouth daily.) 90 tablet 0   metoprolol tartrate (LOPRESSOR) 25 MG tablet Take 1 tablet (25 mg total) by mouth 2 (two) times daily. 180 tablet 3   Multiple Vitamin (MULTIVITAMIN WITH MINERALS) TABS tablet Take 1 tablet by mouth daily.     nitroGLYCERIN (NITROSTAT) 0.4 MG SL tablet Place 1 tablet (0.4 mg total) under the tongue every 5 (five) minutes as needed for chest pain. 25 tablet 3   prasugrel (EFFIENT) 10 MG TABS tablet Take 1 tablet (10 mg total) by mouth daily. 90 tablet 3   simvastatin (ZOCOR) 40 MG tablet TAKE 1 TABLET (40 MG) BY MOUTH AT BEDTIME. 90 tablet 2   No facility-administered medications prior to visit.    Allergies  Allergen Reactions   Ciprofloxacin Other (See Comments)    Abdominal pain   Lipitor [Atorvastatin Calcium] Other (See Comments)    Severe muscle pain   Lisinopril Cough   Metformin And Related Diarrhea    ROS     Objective:    Physical Exam  There were no vitals taken for this visit. Wt Readings from Last 3 Encounters:  05/28/21 245 lb 6.4 oz (111.3 kg)  02/10/21 245 lb (111.1 kg)  01/30/21 245 lb 3.2 oz (111.2 kg)    Diabetic Foot Exam - Simple   No data filed    Lab Results  Component Value Date   WBC 5.4 09/24/2021   HGB 14.8 09/24/2021   HCT 43.9 09/24/2021   PLT 134.0 (L) 09/24/2021   GLUCOSE 147 (H) 09/24/2021   CHOL 122 09/24/2021   TRIG 114.0 09/24/2021   HDL 35.20 (L)  09/24/2021   LDLDIRECT 108.0 04/16/2014   LDLCALC 64 09/24/2021   ALT 23 09/24/2021   AST 22 09/24/2021   NA 137 09/24/2021   K 4.2 09/24/2021   CL 103 09/24/2021   CREATININE 0.98 09/24/2021   BUN 19 09/24/2021   CO2 29 09/24/2021   TSH 3.19 09/24/2021   PSA 1.92 09/25/2018   INR 1.02 05/17/2014   HGBA1C 8.0 (H) 09/24/2021   MICROALBUR 1.7 06/24/2015    Lab Results  Component Value Date   TSH 3.19 09/24/2021   Lab Results  Component Value Date   WBC 5.4 09/24/2021   HGB 14.8 09/24/2021   HCT 43.9 09/24/2021   MCV 94.1 09/24/2021   PLT 134.0 (L) 09/24/2021   Lab Results  Component Value Date   NA 137 09/24/2021   K 4.2 09/24/2021   CO2 29 09/24/2021   GLUCOSE 147 (H) 09/24/2021   BUN 19 09/24/2021   CREATININE 0.98 09/24/2021   BILITOT 0.8 09/24/2021   ALKPHOS 78 09/24/2021   AST 22 09/24/2021   ALT 23 09/24/2021   PROT 6.9 09/24/2021   ALBUMIN 4.3 09/24/2021   CALCIUM 9.0 09/24/2021   ANIONGAP 8 05/18/2014   GFR 74.52 09/24/2021   Lab Results  Component Value Date   CHOL 122 09/24/2021   Lab Results  Component Value Date   HDL 35.20 (L) 09/24/2021   Lab Results  Component Value Date   LDLCALC 64 09/24/2021   Lab Results  Component Value Date   TRIG 114.0 09/24/2021   Lab Results  Component Value Date   CHOLHDL 3 09/24/2021   Lab Results  Component Value Date  HGBA1C 8.0 (H) 09/24/2021       Assessment & Plan:      Problem List Items Addressed This Visit   None   I am having Michael Cunningham. maintain his aspirin EC, multivitamin with minerals, acetaminophen, metoprolol tartrate, simvastatin, glucagon, prasugrel, nitroGLYCERIN, losartan, cetirizine, Pfizer-BioNT COVID-19 Vac-TriS, Cholecalciferol, Accu-Chek FastClix Lancets, glucose-Vitamin C, clotrimazole-betamethasone, insulin aspart, insulin glargine, and Pfizer COVID-19 Vac Bivalent.  No orders of the defined types were placed in this encounter.

## 2021-10-01 ENCOUNTER — Encounter: Payer: Self-pay | Admitting: Family Medicine

## 2021-10-01 ENCOUNTER — Ambulatory Visit (INDEPENDENT_AMBULATORY_CARE_PROVIDER_SITE_OTHER): Payer: Medicare HMO | Admitting: Family Medicine

## 2021-10-01 VITALS — BP 136/70 | HR 53 | Resp 20 | Ht 71.0 in | Wt 246.4 lb

## 2021-10-01 DIAGNOSIS — E559 Vitamin D deficiency, unspecified: Secondary | ICD-10-CM

## 2021-10-01 DIAGNOSIS — I10 Essential (primary) hypertension: Secondary | ICD-10-CM | POA: Diagnosis not present

## 2021-10-01 DIAGNOSIS — E782 Mixed hyperlipidemia: Secondary | ICD-10-CM | POA: Diagnosis not present

## 2021-10-01 DIAGNOSIS — E1142 Type 2 diabetes mellitus with diabetic polyneuropathy: Secondary | ICD-10-CM

## 2021-10-01 MED ORDER — CETIRIZINE HCL 10 MG PO TABS: 10.0000 mg | ORAL_TABLET | Freq: Two times a day (BID) | ORAL | 3 refills | Status: DC | PRN

## 2021-10-01 NOTE — Patient Instructions (Signed)
Continuous Glucose Monitoring, Adult  Continuous glucose monitoring (CGM) is a way to check your blood sugar (or blood glucose) level at any time. A CGM system includes a sensor that attaches to the skin of your belly or arm. The sensor reads the amount of glucose in the fluid between the cells under your skin. Every few minutes throughout the day and night, the sensor wirelessly sends signals to a glucose monitor that records and saves the readings. With most CGM systems, you also need to do a finger stick twice each day to make sure your finger stick reading matches the reading from your CGM device. CGM reduces the number of times you need to do a finger stick throughout the day. CGM lets you to see your glucose levels in real time. This information can help you make decisions about your diet, physical activity, and diabetes medicine. With most monitors, you also need to do a finger stick and check your blood sugar level using a regular glucose monitor before making any medicine decisions. Your health care provider may prescribe a CGM system if: You have type 1 diabetes. You have type 2 diabetes and use insulin. Your diabetes needs to be under tight control. You do not always have awareness of the warning symptoms of low blood sugar. You often have episodes of high blood sugar (hyperglycemia) or low blood sugar (hypoglycemia). Options for CGM systems There are several options for CGM systems. Some include monitors that: You carry or wear. Attach to and control an insulin pump. Send readings to your smartphone, tablet, or health care provider. Set off an alarm if you have low blood sugar or high blood sugar. Work with your health care provider and diabetes educator to find the best option for you. Before you start using a CGM system at home, you will be trained in how to use it. Using a CGM can help you improve the results of your A1C test, which shows your average blood sugar levels for the past 3  months. Using a CGM also can help you avoid hyperglycemia or hypoglycemia. Let your health care provider know if you do not understand how to use your CGM system or have questions. What are the risks? Generally, these devices are safe to use. However, it is possible that the insertion site may become irritated or infected. Tips for using a CGM system: Follow the instructions for your device carefully. Instructions are different for different systems. Make sure you understand how to place the CGM sensors and set up the monitor before you start using it at home. Most devices require that you replace the CGM sensor every 3 to 7 days, depending on the model. Work closely with your health care provider and diabetes educator to learn about your CGM and make sure you are using your CGM system safely and effectively. Do not take baths, swim, or use a hot tub unless your health care provider approves. CGM sensors are usually waterproof and can be left on in the bath or shower. CGM monitors are usually not waterproof. These should be kept dry. Follow these instructions at home: Follow your diabetes treatment plan to keep your blood sugar within your target range. Take action when the CGM alarm alerts you of high or low blood sugar levels. Check your CGM system insertion site every day for signs of infection. Check for: Redness, swelling, or pain. Fluid or blood. Warmth. Pus or a bad smell. Keep all follow-up visits as told by your health care   provider. This is important. Where to find more information National Institutes of Health, Continuous Glucose Monitoring: niddk.nih.gov Contact a health care provider if: You are not sure how to use your CGM system or how to interpret the readings. You have finger sticks that do not match your monitor readings. Your CGM often alerts you of hyperglycemia or hypoglycemia. You have a fever. Get help right away if: You have signs of infection at your insertion  site. You have symptoms of hyperglycemia or hypoglycemia that do not get better with treatment. Summary Continuous glucose monitoring (CGM) is a way to check your blood sugar (or bloodglucose) level at any time. A CGM system includes a sensor that attaches to the skin of your belly or arm and a monitor to view the readings. CGM lets you to see your blood sugar levels in real time. This information can help you make decisions about your diet, physical activity, and diabetes medicine. Let your health care provider or diabetes educator know if you do not understand how to use your CGM system or have questions. This information is not intended to replace advice given to you by your health care provider. Make sure you discuss any questions you have with your health care provider. Document Revised: 03/30/2019 Document Reviewed: 03/30/2019 Elsevier Patient Education  2023 [employer]  

## 2021-10-01 NOTE — Progress Notes (Signed)
Subjective:   By signing my name below, I, Kellie Simmering, attest that this documentation has been prepared under the direction and in the presence of Mosie Lukes, MD 10/01/2021.   Patient ID: Michael Cunningham., male    DOB: January 31, 1946, 76 y.o.   MRN: [id number]  Chief Complaint  Patient presents with   Follow-up    HPI Patient is in today for an office visit. His wife is also speaking on his behalf.  Cetirizine: He is currently taking Cetirizine 10 mg once daily to manage his symptoms but complains that it has not been effective. His wife states that he does not handle nasal sprays well. He is interested in increasing his Cetirizine dosage to twice a day.   Sputum projection: He reports that he has been having increased sputum projection.   Blood sugar: His blood sugar has been slightly elevated. He is currently taking Novolog 100 unit/mL to manage his blood sugar. He has an upcoming appointment with his endocrinologist.   Lab Results  Component Value Date   HGBA1C 8.0 (H) 09/24/2021   Podiatry: He has an upcoming appointment on 10/05/2021 with his podiatrist Dr. Earleen Newport.  Cardiology: He has a cardiology appointment scheduled in 01/2022.   Past Medical History:  Diagnosis Date   CAD (coronary artery disease)    a. 05/2014 [place] s/p DES x4 to mid-distLAD and DESx1 to DI   Complication of anesthesia    11'12 Surgery with excrutiating "head about to explode" upon awakening  -lasted several hours postop  ( SURGERY DONE 08-13-2011 AT [facility] PT DID OK POST OP)   Diverticulosis of colon    LEFT SIDE   Exposure to Agent [facility] 10/18/2016   [place]   History of small bowel obstruction    X2  28 YRS AGO AND LAST ONE 04/2014--  RESOLVED WITHOUT SURGICAL INTERVENTION   Hyperlipidemia    Hypertension    Knee pain, bilateral 08/13/2011   Left ureteral calculus    Multiple lipomas    hx. mulitple and some remains -arms, legs   Obesity, unspecified 09/18/2013   Obstructive sleep apnea  01/01/2016   Recurrent kidney stones 07/08/2013   Follows with Dr Gaynelle Arabian He believes they are Calcium oxalate stones   Thrombocytopenia ([place]) 07/07/2014   Type 2 diabetes mellitus ([place])    Vitamin D deficiency 07/02/2014    Past Surgical History:  Procedure Laterality Date   COLONOSCOPY W/ POLYPECTOMY  LAST ONE 2013   CORONARY STENT PLACEMENT  05/17/2014   PROXIMAL X 4  MID & DISTAL LAD  DIAGONAL    KNEE ARTHROSCOPY  08/13/2011   right ARTHROSCOPY KNEE;  Surgeon: Gearlean Alf, MD;  Location: [facility];  Service: Orthopedics;  Laterality: Right;  medial  meniscal debridement, excision of plica, synovectomy   KNEE ARTHROSCOPY Left 11/16/2013   Procedure: LEFT KNEE ARTHROSCOPY MEDIAL MENISCAL DEBRIDEMENT, CHONDROPLASTY;  Surgeon: Gearlean Alf, MD;  Location: [facility];  Service: Orthopedics;  Laterality: Left;   KNEE ARTHROSCOPY W/ MENISCECTOMY Right 01-29-2011   twice, first trimmed mediscus and cyst   LEFT HEART CATHETERIZATION WITH CORONARY ANGIOGRAM N/A 05/17/2014   Procedure: LEFT HEART CATHETERIZATION WITH CORONARY ANGIOGRAM;  Surgeon: Burnell Blanks, MD;  [facility] CATH LAB;  Service: Cardiovascular;  Laterality: N/A;   LIPOMA EXCISION  1975   multiple, 10-12   PERCUTANEOUS CORONARY STENT INTERVENTION (PCI-S)  05/17/2014   Procedure: PERCUTANEOUS CORONARY STENT INTERVENTION (PCI-S);  Surgeon: Burnell Blanks, MD;  Location: [facility] CATH  LAB;  Service: Cardiovascular;;  Diag1 and Prox to Distal LAD    Family History  Problem Relation Age of Onset   Hypertension Mother    Cancer Mother 52       ovarian   Heart disease Father        CHF   Heart attack Father 77   Cancer Sister        breast- just finished her last radiation   Heart disease Maternal Grandfather        ?   Heart attack Paternal Grandfather 77   Heart attack Paternal Uncle 72       Sudden death   Diabetes Neg Hx     Social History   Socioeconomic History   Marital status: Married    Spouse name:  Not on file   Number of children: 2   Years of education: Not on file   Highest education level: Not on file  Occupational History   Not on file  Tobacco Use   Smoking status: Former    Packs/day: 1.00    Years: 4.00    Total pack years: 4.00    Types: Cigarettes    Quit date: 06/14/1967    Years since quitting: 54.3   Smokeless tobacco: Never  Substance and Sexual Activity   Alcohol use: No    Alcohol/week: 0.0 standard drinks of alcohol   Drug use: No   Sexual activity: Yes    Comment: lives with wife  Other Topics Concern   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     Social Determinants of Health   Financial Resource Strain: Low Risk  (02/10/2021)   Overall Financial Resource Strain (CARDIA)    Difficulty of Paying Living Expenses: Not hard at all  Food Insecurity: No Food Insecurity (02/10/2021)   Hunger Vital Sign    Worried About Running Out of Food in the Last Year: Never true    Ran Out of Food in the Last Year: Never true  Transportation Needs: No Transportation Needs (02/10/2021)   PRAPARE - Hydrologist (Medical): No    Lack of Transportation (Non-Medical): No  Physical Activity: Inactive (02/10/2021)   Exercise Vital Sign    Days of Exercise per Week: 0 days    Minutes of Exercise per Session: 0 min  Stress: No Stress Concern Present (02/10/2021)   [place]    Feeling of Stress : Not at all  Social Connections: [place] (02/10/2021)   Social Connection and Isolation Panel [NHANES]    Frequency of Communication with Friends and Family: Not on file    Frequency of Social Gatherings with Friends and Family: More than three times a week    Attends Religious Services: More than 4 times per year    Active Member of [employer] or Organizations: Yes    Attends Music therapist: More than 4 times per  year    Marital Status: Married  Human resources officer Violence: Not on file    Outpatient Medications Prior to Visit  Medication Sig Dispense Refill   Accu-Chek FastClix Lancets MISC Use to test blood sugar 2 times daily. Dx: E11.40 204 each 5   acetaminophen (TYLENOL) 500 MG tablet Take 1,000 mg by mouth every 6 (six) hours as needed for mild pain. Reported on 03/31/2015     aspirin EC 81 MG tablet Take 81 mg  by mouth daily with breakfast.      Cholecalciferol 50 MCG (2000 UT) TABS Take 2 tablets by mouth daily.     clotrimazole-betamethasone (LOTRISONE) cream Apply 1 application topically 2 (two) times daily. 30 g 0   COVID-19 mRNA bivalent vaccine, Pfizer, (PFIZER COVID-19 VAC BIVALENT) injection Inject into the muscle. 0.3 mL 0   COVID-19 mRNA Vac-TriS, Pfizer, (PFIZER-BIONT COVID-19 VAC-TRIS) SUSP injection Inject into the muscle. 0.3 mL 0   glucagon (GLUCAGON EMERGENCY) 1 MG injection Inject 1 mg into the muscle once as needed for up to 1 dose. 1 each 12   glucose-Vitamin C 4-0.006 GM CHEW chewable tablet CHEW FOUR TABLETS BY MOUTH  AS NEEDED (REPEAT EVERY 15 MINUTES IF BLOOD SUGAR LESS THAN 70)     insulin aspart (NOVOLOG) 100 UNIT/ML injection Inject 15 Units into the skin 3 (three) times daily before meals. Inject in skin before meals     insulin glargine (LANTUS) 100 UNIT/ML injection Inject 40-50 Units into the skin daily.     losartan (COZAAR) 100 MG tablet Take 1 tablet (100 mg total) by mouth daily. (Patient taking differently: Take 75 mg by mouth daily.) 90 tablet 0   metoprolol tartrate (LOPRESSOR) 25 MG tablet Take 1 tablet (25 mg total) by mouth 2 (two) times daily. 180 tablet 3   Multiple Vitamin (MULTIVITAMIN WITH MINERALS) TABS tablet Take 1 tablet by mouth daily.     nitroGLYCERIN (NITROSTAT) 0.4 MG SL tablet Place 1 tablet (0.4 mg total) under the tongue every 5 (five) minutes as needed for chest pain. 25 tablet 3   prasugrel (EFFIENT) 10 MG TABS tablet Take 1 tablet (10 mg  total) by mouth daily. 90 tablet 3   simvastatin (ZOCOR) 40 MG tablet TAKE 1 TABLET (40 MG) BY MOUTH AT BEDTIME. 90 tablet 2   cetirizine (ZYRTEC) 10 MG tablet TAKE 1 TABLET BY MOUTH EVERY MORNING 100 tablet 3   No facility-administered medications prior to visit.    Allergies  Allergen Reactions   Ciprofloxacin Other (See Comments)    Abdominal pain   Lipitor [Atorvastatin Calcium] Other (See Comments)    Severe muscle pain   Lisinopril Cough   Metformin And Related Diarrhea    Review of Systems  Respiratory:  Positive for sputum production.        Objective:    Physical Exam Constitutional:      General: He is not in acute distress.    Appearance: Normal appearance. He is not ill-appearing.  HENT:     Head: Normocephalic and atraumatic.     Right Ear: External ear normal.     Left Ear: External ear normal.  Eyes:     Pupils: Pupils are equal, round, and reactive to light.  Cardiovascular:     Rate and Rhythm: Normal rate and regular rhythm.     Pulses: Normal pulses.     Heart sounds: Normal heart sounds. No murmur heard.    No gallop.  Pulmonary:     Effort: Pulmonary effort is normal. No respiratory distress.     Breath sounds: Normal breath sounds. No wheezing or rales.  Abdominal:     General: Bowel sounds are normal.  Skin:    General: Skin is warm and dry.     Coloration: Skin is jaundiced.  Neurological:     Mental Status: He is alert and oriented to person, place, and time.  Psychiatric:        Mood and Affect: Mood normal.  Behavior: Behavior normal.        Judgment: Judgment normal.     BP 136/70 (BP Location: Left Arm, Patient Position: Sitting, Cuff Size: Large)   Pulse (!) 53   Resp 20   Ht '5\' 11"'$  (1.803 m)   Wt 246 lb 6.4 oz (111.8 kg)   SpO2 95%   BMI 34.37 kg/m  Wt Readings from Last 3 Encounters:  10/01/21 246 lb 6.4 oz (111.8 kg)  05/28/21 245 lb 6.4 oz (111.3 kg)  02/10/21 245 lb (111.1 kg)    Diabetic Foot Exam - Simple    No data filed    Lab Results  Component Value Date   WBC 5.4 09/24/2021   HGB 14.8 09/24/2021   HCT 43.9 09/24/2021   PLT 134.0 (L) 09/24/2021   GLUCOSE 147 (H) 09/24/2021   CHOL 122 09/24/2021   TRIG 114.0 09/24/2021   HDL 35.20 (L) 09/24/2021   LDLDIRECT 108.0 04/16/2014   LDLCALC 64 09/24/2021   ALT 23 09/24/2021   AST 22 09/24/2021   NA 137 09/24/2021   K 4.2 09/24/2021   CL 103 09/24/2021   CREATININE 0.98 09/24/2021   BUN 19 09/24/2021   CO2 29 09/24/2021   TSH 3.19 09/24/2021   PSA 1.92 09/25/2018   INR 1.02 05/17/2014   HGBA1C 8.0 (H) 09/24/2021   MICROALBUR 1.7 06/24/2015    Lab Results  Component Value Date   TSH 3.19 09/24/2021   Lab Results  Component Value Date   WBC 5.4 09/24/2021   HGB 14.8 09/24/2021   HCT 43.9 09/24/2021   MCV 94.1 09/24/2021   PLT 134.0 (L) 09/24/2021   Lab Results  Component Value Date   NA 137 09/24/2021   K 4.2 09/24/2021   CO2 29 09/24/2021   GLUCOSE 147 (H) 09/24/2021   BUN 19 09/24/2021   CREATININE 0.98 09/24/2021   BILITOT 0.8 09/24/2021   ALKPHOS 78 09/24/2021   AST 22 09/24/2021   ALT 23 09/24/2021   PROT 6.9 09/24/2021   ALBUMIN 4.3 09/24/2021   CALCIUM 9.0 09/24/2021   ANIONGAP 8 05/18/2014   GFR 74.52 09/24/2021   Lab Results  Component Value Date   CHOL 122 09/24/2021   Lab Results  Component Value Date   HDL 35.20 (L) 09/24/2021   Lab Results  Component Value Date   LDLCALC 64 09/24/2021   Lab Results  Component Value Date   TRIG 114.0 09/24/2021   Lab Results  Component Value Date   CHOLHDL 3 09/24/2021   Lab Results  Component Value Date   HGBA1C 8.0 (H) 09/24/2021      Assessment & Plan:   Problem List Items Addressed This Visit     Hypertension (Chronic)    Well controlled, no changes to meds. Encouraged heart healthy diet such as the DASH diet and exercise as tolerated.       Hyperlipidemia    Encourage heart healthy diet such as MIND or DASH diet, increase  exercise, avoid trans fats, simple carbohydrates and processed foods, consider a krill or fish or flaxseed oil cap daily.       Vitamin D deficiency    Supplement and monitor      Well controlled type 2 diabetes mellitus with peripheral neuropathy ([place]) - Primary    Has had a slight spike up in his hgba1c as they have liberalized his treatment some to avoid low numbers. His home monitor reports, he will [facility]      Meds ordered this encounter  Medications   cetirizine (ZYRTEC) 10 MG tablet    Sig: Take 1 tablet (10 mg total) by mouth 2 (two) times daily as needed for allergies.    Dispense:  100 tablet    Refill:  3   I, Penni Homans, MD, personally preformed the services described in this documentation.  All medical record entries made by the scribe were at my direction and in my presence.  I have reviewed the chart and discharge instructions (if applicable) and agree that the record reflects my personal performance and is accurate and complete. 10/01/2021.  I,Mohammed Iqbal,acting as a scribe for Penni Homans, MD.,have documented all relevant documentation on the behalf of Penni Homans, MD,as directed by  Penni Homans, MD while in the presence of Penni Homans, MD.  Penni Homans, MD

## 2021-10-02 ENCOUNTER — Other Ambulatory Visit: Payer: Self-pay

## 2021-10-02 DIAGNOSIS — E782 Mixed hyperlipidemia: Secondary | ICD-10-CM

## 2021-10-02 DIAGNOSIS — E559 Vitamin D deficiency, unspecified: Secondary | ICD-10-CM

## 2021-10-02 DIAGNOSIS — I10 Essential (primary) hypertension: Secondary | ICD-10-CM

## 2021-10-02 DIAGNOSIS — E1142 Type 2 diabetes mellitus with diabetic polyneuropathy: Secondary | ICD-10-CM

## 2021-10-02 NOTE — Assessment & Plan Note (Signed)
Has had a slight spike up in his hgba1c as they have liberalized his treatment some to avoid low numbers. His home monitor reports, he will continue to monitor with his endocrinologist at the [facility]

## 2021-10-02 NOTE — Assessment & Plan Note (Signed)
Well controlled, no changes to meds. Encouraged heart healthy diet such as the DASH diet and exercise as tolerated.  °

## 2021-10-02 NOTE — Assessment & Plan Note (Signed)
Encourage heart healthy diet such as MIND or DASH diet, increase exercise, avoid trans fats, simple carbohydrates and processed foods, consider a krill or fish or flaxseed oil cap daily.  °

## 2021-10-02 NOTE — Assessment & Plan Note (Signed)
Supplement and monitor 

## 2021-10-05 ENCOUNTER — Ambulatory Visit: Payer: Medicare HMO | Admitting: Podiatry

## 2021-10-05 DIAGNOSIS — D689 Coagulation defect, unspecified: Secondary | ICD-10-CM

## 2021-10-05 DIAGNOSIS — E1149 Type 2 diabetes mellitus with other diabetic neurological complication: Secondary | ICD-10-CM | POA: Diagnosis not present

## 2021-10-05 DIAGNOSIS — B351 Tinea unguium: Secondary | ICD-10-CM

## 2021-10-05 DIAGNOSIS — M79676 Pain in unspecified toe(s): Secondary | ICD-10-CM

## 2021-10-05 NOTE — Progress Notes (Signed)
Subjective: 76 y.o. returns the office today for painful, elongated, thickened toenails which he cannot trim himself. Denies any redness or drainage around the nails.  No open lesions he reports no new concerns today.  PCP: Mosie Lukes, MD -last seen May 28, 2021 Endocrinologist: Dr. Benjiman Core, MD   Last A1c was 7.7 on 05/21/2021  Objective: AAO 3, NAD DP/PT pulses palpable, CRT less than 3 seconds Sensation is mildly decreased with SWM but overall no change.  Neuropathy symptoms are unchanged. Nails hypertrophic, dystrophic, elongated, brittle, discolored 10. There is tenderness overlying the nails 1-5 bilaterally. There is no surrounding erythema or drainage along the nail sites. No pain with calf compression, swelling, warmth, erythema.  Assessment: Patient presents with symptomatic onychomycosis  Plan: -Treatment options including alternatives, risks, complications were discussed -Nails sharply debrided 10 without complication/bleeding. -Discussed daily foot inspection. If there are any changes, to call the office immediately.  -Follow-up as scheduled or sooner if any problems are to arise. In the meantime, encouraged to call the office with any questions, concerns, changes symptoms.  Return in about 2 months (around 12/05/2021).w   Celesta Gentile, DPM

## 2021-10-07 ENCOUNTER — Emergency Department (HOSPITAL_BASED_OUTPATIENT_CLINIC_OR_DEPARTMENT_OTHER)
Admission: EM | Admit: 2021-10-07 | Discharge: 2021-10-07 | Disposition: A | Discharge status: Home / Self Care | Payer: No Typology Code available for payment source | Attending: Emergency Medicine | Admitting: Emergency Medicine

## 2021-10-07 ENCOUNTER — Encounter (HOSPITAL_BASED_OUTPATIENT_CLINIC_OR_DEPARTMENT_OTHER): Payer: Self-pay | Admitting: Emergency Medicine

## 2021-10-07 ENCOUNTER — Other Ambulatory Visit: Payer: Self-pay

## 2021-10-07 ENCOUNTER — Telehealth: Payer: Self-pay | Admitting: Family Medicine

## 2021-10-07 DIAGNOSIS — Z794 Long term (current) use of insulin: Secondary | ICD-10-CM | POA: Insufficient documentation

## 2021-10-07 DIAGNOSIS — R040 Epistaxis: Secondary | ICD-10-CM | POA: Diagnosis present

## 2021-10-07 DIAGNOSIS — Z7982 Long term (current) use of aspirin: Secondary | ICD-10-CM | POA: Diagnosis not present

## 2021-10-07 DIAGNOSIS — Z79899 Other long term (current) drug therapy: Secondary | ICD-10-CM | POA: Insufficient documentation

## 2021-10-07 MED ORDER — SILVER NITRATE-POT NITRATE 75-25 % EX MISC
1.0000 | Freq: Once | CUTANEOUS | Status: DC
  Filled 2021-10-07: qty 10

## 2021-10-07 MED ORDER — OXYMETAZOLINE HCL 0.05 % NA SOLN
1.0000 | Freq: Once | NASAL | Status: AC
  Administered 2021-10-07: 1 via NASAL
  Filled 2021-10-07: qty 30

## 2021-10-07 NOTE — Telephone Encounter (Signed)
Nurse Assessment Nurse: Roselyn Reef, RN, [place] Date/Time (Eastern Time): 10/07/2021 3:07:03 PM Confirm and document reason for call. If symptomatic, describe symptoms. ---Caller states he had a nosebleed that lasted for about 15 minutes. States she has been holding pressure on his nose since 2. On blood thinners. Denies any other abnormal bleeding bruising Does the patient have any new or worsening symptoms? ---Yes Will a triage be completed? ---Yes Related visit to physician within the last 2 weeks? ---No Does the PT have any chronic conditions? (i.e. diabetes, asthma, this includes High risk factors for pregnancy, etc.) ---Yes List chronic conditions. ---diabetes, heart stints, taking blood thinner Is this a behavioral health or substance abuse call? ---No Guidelines Guideline Title Affirmed Question Affirmed Notes Nurse Date/Time (Eastern Time) Nosebleed [1] Bleeding present > 30 minutes AND [2] using correct method of direct pressure Roselyn Reef, RN, [facility] 10/07/2021 3:09:10 PM Disp. Time Eilene Ghazi Time) Disposition Final User 10/07/2021 3:05:15 PM Send to Urgent Eduard Clos PLEASE NOTE: All timestamps contained within this report are represented as [place] Standard Time. CONFIDENTIALTY NOTICE: This fax transmission is intended only for the addressee. It contains information that is legally privileged, confidential or otherwise protected from use or disclosure. If you are not the intended recipient, you are strictly prohibited from reviewing, disclosing, copying using or disseminating any of this information or taking any action in reliance on or regarding this information. If you have received this fax in error, please notify us immediately by telephone so that we can arrange for its return to [place]. Phone: [phone], Toll-Free: [phone], Fax: [phone] Page: 2 of 2 Call Id: [id number] [place]. Time Eilene Ghazi Time) Disposition Final User 10/07/2021 3:12:43 PM Go to ED Now Yes  Roselyn Reef, RN, [name] Final Disposition 10/07/2021 3:12:43 PM Go to ED Now Yes Roselyn Reef, RN, [facility] Disagree/Comply Comply Caller Understands Yes PreDisposition InappropriateToAsk Care Advice Given Per Guideline GO TO ED NOW: * You need to be seen in the Emergency Department. * Go to the ED at ___________ [place] now. Drive carefully. * LEAN FORWARD: Sit down and lean forward. Reason: Blood makes people choke if they lean backwards. [facility] THE NOSE: Gently squeeze the soft parts of the lower nose (nostrils) together. Use your thumb and your index finger in a pinching manner. Goal: Apply constant pressure to the bleeding point. ANOTHER ADULT SHOULD DRIVE: BRING MEDICINES: * Bring a list of your current medicines when you go to the Emergency Department (ER). CARE ADVICE given per Nosebleed (Adult) guideline. Referrals [facility] - ED

## 2021-10-07 NOTE — ED Provider Notes (Signed)
[place] EMERGENCY DEPARTMENT Provider Note   CSN: [id number] Arrival date & time: 10/07/21  1542     History {Add pertinent medical, surgical, social history, OB history to HPI:1} Chief Complaint  Patient presents with   Epistaxis    Michael Cunningham. is a 76 y.o. male.  Michael Cunningham has a history of cardiac disease on prasugrel.  Complaining of nosebleed from the left side that started about 2 hours ago.  No trauma.  Has had some clot that Michael Cunningham coughed up from posterior drainage.  No prior history of same.  No dizziness chest pain shortness of breath.  Has tried holding pressure with minimal improvement.  The history is provided by the patient and the spouse.  Epistaxis Location:  L nare Severity:  Moderate Duration:  2 hours Timing:  Constant Progression:  Unchanged Chronicity:  New Context: anticoagulants   Context: not trauma   Relieved by:  Nothing Worsened by:  Nothing Ineffective treatments:  Applying pressure Associated symptoms: blood in oropharynx   Associated symptoms: no fever and no sore throat        Home Medications Prior to Admission medications   Medication Sig Start Date End Date Taking? Authorizing Provider  Accu-Chek FastClix Lancets MISC Use to test blood sugar 2 times daily. Dx: E11.40 10/02/20   Mosie Lukes, MD  acetaminophen (TYLENOL) 500 MG tablet Take 1,000 mg by mouth every 6 (six) hours as needed for mild pain. Reported on 03/31/2015    [provider]  aspirin EC 81 MG tablet Take 81 mg by mouth daily with breakfast.     [provider]  cetirizine (ZYRTEC) 10 MG tablet Take 1 tablet (10 mg total) by mouth 2 (two) times daily as needed for allergies. 10/01/21 10/01/22  Mosie Lukes, MD  Cholecalciferol 50 MCG (2000 UT) TABS Take 2 tablets by mouth daily. 04/08/20   [provider]  clotrimazole-betamethasone (LOTRISONE) cream Apply 1 application topically 2 (two) times daily. 01/19/21   Trula Slade, DPM   COVID-19 mRNA bivalent vaccine, Pfizer, (PFIZER COVID-19 VAC BIVALENT) injection Inject into the muscle. 01/14/21   Carlyle Basques, MD  COVID-19 mRNA Vac-TriS, Pfizer, (PFIZER-BIONT COVID-19 VAC-TRIS) SUSP injection Inject into the muscle. 07/11/20   Carlyle Basques, MD  glucagon (GLUCAGON EMERGENCY) 1 MG injection Inject 1 mg into the muscle once as needed for up to 1 dose. 11/29/17   Philemon Kingdom, MD  glucose-Vitamin C 4-0.006 GM CHEW chewable tablet CHEW FOUR TABLETS BY MOUTH  AS NEEDED (REPEAT EVERY 15 MINUTES IF BLOOD SUGAR LESS THAN 70) 04/08/20   [provider]  insulin aspart (NOVOLOG) 100 UNIT/ML injection Inject 15 Units into the skin 3 (three) times daily before meals. Inject in skin before meals    [provider]  insulin glargine (LANTUS) 100 UNIT/ML injection Inject 40-50 Units into the skin daily.    [provider]  losartan (COZAAR) 100 MG tablet Take 1 tablet (100 mg total) by mouth daily. Patient taking differently: Take 75 mg by mouth daily. 03/17/20   Mosie Lukes, MD  metoprolol tartrate (LOPRESSOR) 25 MG tablet Take 1 tablet (25 mg total) by mouth 2 (two) times daily. 04/25/15   Minus Breeding, MD  Multiple Vitamin (MULTIVITAMIN WITH MINERALS) TABS tablet Take 1 tablet by mouth daily.    [provider]  nitroGLYCERIN (NITROSTAT) 0.4 MG SL tablet Place 1 tablet (0.4 mg total) under the tongue every 5 (five) minutes as needed for chest pain.  01/20/18   Minus Breeding, MD  prasugrel (EFFIENT) 10 MG TABS tablet Take 1 tablet (10 mg total) by mouth daily. 01/20/18   Minus Breeding, MD  simvastatin (ZOCOR) 40 MG tablet TAKE 1 TABLET (40 MG) BY MOUTH AT BEDTIME. 05/20/15   Mosie Lukes, MD  famotidine (PEPCID) 20 MG tablet Take 1 tablet (20 mg total) by mouth at bedtime. Patient not taking: Reported on 03/17/2020 11/27/19 03/17/20  Mosie Lukes, MD      Allergies    Ciprofloxacin, Lipitor [atorvastatin calcium], Lisinopril, and Metformin  and related    Review of Systems   Review of Systems  Constitutional:  Negative for fever.  HENT:  Positive for nosebleeds. Negative for sore throat.     Physical Exam Updated Vital Signs BP (!) 185/71 (BP Location: Right Arm)   Pulse (!) 56   Temp 97.8 F (36.6 C)   Resp 20   SpO2 97%  Physical Exam Vitals and nursing note reviewed.  Constitutional:      General: Michael Cunningham is not in acute distress.    Appearance: Normal appearance. Michael Cunningham is well-developed.  HENT:     Head: Normocephalic and atraumatic.     Mouth/Throat:     Mouth: Mucous membranes are moist.     Pharynx: Oropharynx is clear.  Eyes:     Conjunctiva/sclera: Conjunctivae normal.  Cardiovascular:     Rate and Rhythm: Normal rate and regular rhythm.     Heart sounds: No murmur heard. Pulmonary:     Effort: Pulmonary effort is normal. No respiratory distress.     Breath sounds: Normal breath sounds.  Abdominal:     Palpations: Abdomen is soft.     Tenderness: There is no abdominal tenderness.  Musculoskeletal:        General: No swelling.     Cervical back: Neck supple.  Skin:    General: Skin is warm and dry.     Capillary Refill: Capillary refill takes less than 2 seconds.  Neurological:     General: No focal deficit present.     Mental Status: Michael Cunningham is alert.     ED Results / Procedures / Treatments   Labs (all labs ordered are listed, but only abnormal results are displayed) Labs Reviewed - No data to display  EKG None  Radiology No results found.  Procedures .Epistaxis Management  Date/Time: 10/07/2021 4:13 PM  Performed by: Hayden Rasmussen, MD Authorized by: Hayden Rasmussen, MD   Consent:    Consent obtained:  Verbal   Consent given by:  Patient   Risks, benefits, and alternatives were discussed: yes     Risks discussed:  Bleeding, infection, nasal injury and pain   Alternatives discussed:  Delayed treatment, no treatment and referral Universal protocol:    Procedure explained and  questions answered to patient or proxy's satisfaction: yes     Patient identity confirmed:  Verbally with patient Procedure details:    Treatment site:  L anterior   Treatment method:  Silver nitrate   Treatment complexity:  Limited   Treatment episode: initial   Post-procedure details:    Assessment:  Bleeding stopped   Procedure completion:  Tolerated well, no immediate complications   {Document cardiac monitor, telemetry assessment procedure when appropriate:1}  Medications Ordered in ED Medications  oxymetazoline (AFRIN) 0.05 % nasal spray 1 spray (has no administration in time range)    ED Course/ Medical Decision Making/ A&P  Medical Decision Making Risk OTC drugs. Prescription drug management.  This patient complains of ***; this involves an extensive number of treatment Options and is a complaint that carries with it a high risk of complications and morbidity. The differential includes ***  I ordered, reviewed and interpreted labs, which included *** I ordered medication *** and reviewed PMP when indicated. I ordered imaging studies which included *** and I independently    visualized and interpreted imaging which showed *** Additional history obtained from *** Previous records obtained and reviewed *** I consulted *** and discussed lab and imaging findings and discussed disposition.  Cardiac monitoring reviewed, *** Social determinants considered, *** Critical Interventions: ***  After the interventions stated above, I reevaluated the patient and found *** Admission and further testing considered, ***    {Document critical care time when appropriate:1} {Document review of labs and clinical decision tools ie heart score, Chads2Vasc2 etc:1}  {Document your independent review of radiology images, and any outside records:1} {Document your discussion with family members, caretakers, and with consultants:1} {Document social determinants of  health affecting pt's care:1} {Document your decision making why or why not admission, treatments were needed:1} Final Clinical Impression(s) / ED Diagnoses Final diagnoses:  None    Rx / DC Orders ED Discharge Orders     None

## 2021-10-07 NOTE — Telephone Encounter (Signed)
Pt in ED.  

## 2021-10-07 NOTE — ED Triage Notes (Signed)
Nose bleed since 2 pm + blood thinners Bleeding started after blowing nose Endorses clots (wife brought one of the clots in a sandwich bag)

## 2021-10-07 NOTE — Discharge Instructions (Signed)
You were seen in the emergency department for left-sided nosebleed.  There was an area of possible bleeding that was cauterized.  If you rebleed please hold pressure for 15 minutes without checking.  If nosebleed continues return to emergency department for further evaluation.  Drink plenty of fluids.

## 2021-10-07 NOTE — Telephone Encounter (Signed)
Patient's wife called to advise that he has a bloody nose. She said he thought his nose was running to heblew his nose and it was bright red tissue. She said that he passed a significant clot of blood in the bowl she gave him and it was an inch in diameter. He is sitting at the counter with tissue. Transferred wife to Triage for additional help.

## 2021-11-24 DIAGNOSIS — E119 Type 2 diabetes mellitus without complications: Secondary | ICD-10-CM | POA: Diagnosis not present

## 2021-11-24 DIAGNOSIS — H2513 Age-related nuclear cataract, bilateral: Secondary | ICD-10-CM | POA: Diagnosis not present

## 2021-11-24 DIAGNOSIS — H5213 Myopia, bilateral: Secondary | ICD-10-CM | POA: Diagnosis not present

## 2021-12-03 DIAGNOSIS — H269 Unspecified cataract: Secondary | ICD-10-CM | POA: Diagnosis not present

## 2021-12-03 DIAGNOSIS — H2512 Age-related nuclear cataract, left eye: Secondary | ICD-10-CM | POA: Diagnosis not present

## 2021-12-07 ENCOUNTER — Encounter: Payer: Self-pay | Admitting: Family Medicine

## 2021-12-10 ENCOUNTER — Ambulatory Visit: Payer: Medicare HMO | Admitting: Podiatry

## 2021-12-10 DIAGNOSIS — M79676 Pain in unspecified toe(s): Secondary | ICD-10-CM | POA: Diagnosis not present

## 2021-12-10 DIAGNOSIS — B351 Tinea unguium: Secondary | ICD-10-CM | POA: Diagnosis not present

## 2021-12-10 DIAGNOSIS — E1149 Type 2 diabetes mellitus with other diabetic neurological complication: Secondary | ICD-10-CM

## 2021-12-10 DIAGNOSIS — D689 Coagulation defect, unspecified: Secondary | ICD-10-CM

## 2021-12-11 NOTE — Progress Notes (Signed)
Subjective: 76 y.o. returns the office today for painful, elongated, thickened toenails which he cannot trim himself. Denies any redness or drainage around the nails.  No open lesions he reports no new concerns today.  PCP: Mosie Lukes, MD -last seen May 28, 2021 Endocrinologist: Dr. Benjiman Core, MD   Last A1c was 8.0 on September 24, 2021  Objective: AAO 3, NAD DP/PT pulses palpable, CRT less than 3 seconds Sensation is mildly decreased with SWM but overall no change.  Neuropathy symptoms are unchanged. Nails hypertrophic, dystrophic, elongated, brittle, discolored 10. There is tenderness overlying the nails 1-5 bilaterally. There is no surrounding erythema or drainage along the nail sites. No pain with calf compression, swelling, warmth, erythema.  Assessment: Patient presents with symptomatic onychomycosis  Plan: -Treatment options including alternatives, risks, complications were discussed -Nails sharply debrided 10 without complication/bleeding. -Discussed daily foot inspection. If there are any changes, to call the office immediately.  -Follow-up as scheduled or sooner if any problems are to arise. In the meantime, encouraged to call the office with any questions, concerns, changes symptoms.  Return in about 2 months (around 02/10/2022).   Celesta Gentile, DPM

## 2021-12-18 ENCOUNTER — Ambulatory Visit (INDEPENDENT_AMBULATORY_CARE_PROVIDER_SITE_OTHER): Payer: Medicare HMO | Admitting: *Deleted

## 2021-12-18 DIAGNOSIS — Z23 Encounter for immunization: Secondary | ICD-10-CM | POA: Diagnosis not present

## 2021-12-18 NOTE — Progress Notes (Signed)
Patient here for high dose flu vaccine.  Vaccine given in left deltoid and patient tolerated well.  

## 2021-12-29 ENCOUNTER — Ambulatory Visit: Payer: Medicare HMO | Admitting: Pulmonary Disease

## 2021-12-29 ENCOUNTER — Encounter: Payer: Self-pay | Admitting: Pulmonary Disease

## 2021-12-29 VITALS — BP 122/70 | HR 89 | Ht 69.0 in | Wt 244.4 lb

## 2021-12-29 DIAGNOSIS — G4733 Obstructive sleep apnea (adult) (pediatric): Secondary | ICD-10-CM | POA: Diagnosis not present

## 2021-12-29 NOTE — Progress Notes (Signed)
Subjective:    Patient ID: Michael Cunningham., male    DOB: [birth year], 76 y.o.   MRN: [id number]  Patient with obstructive sleep apnea diagnosed in 2017   In for follow-up today Continues to use CPAP on a nightly basis  Denies any significant daytime symptoms  Currently on a CPAP of 12  Goes to bed about 10 PM, about 2 awakenings Final wake up time between 6 and 8 AM  No significant changes in his health since the last visit He does have a history of coronary artery disease, diabetes, hypercholesterolemia, hypertension  He continues to function well  Tries to stay active     Past Medical History:  Diagnosis Date   CAD (coronary artery disease)    a. 05/2014 [place] s/p DES x4 to mid-distLAD and DESx1 to DI   Complication of anesthesia    11'12 Surgery with excrutiating "head about to explode" upon awakening  -lasted several hours postop  ( SURGERY DONE 08-13-2011 AT [facility] PT DID OK POST OP)   Diverticulosis of colon    LEFT SIDE   Exposure to Agent [facility] 10/18/2016   [place]   History of small bowel obstruction    X2  28 YRS AGO AND LAST ONE 04/2014--  RESOLVED WITHOUT SURGICAL INTERVENTION   Hyperlipidemia    Hypertension    Knee pain, bilateral 08/13/2011   Left ureteral calculus    Multiple lipomas    hx. mulitple and some remains -arms, legs   Obesity, unspecified 09/18/2013   Obstructive sleep apnea 01/01/2016   Recurrent kidney stones 07/08/2013   Follows with Dr Gaynelle Arabian He believes they are Calcium oxalate stones   Thrombocytopenia ([place]) 07/07/2014   Type 2 diabetes mellitus ([place])    Vitamin D deficiency 07/02/2014   Social History   Socioeconomic History   Marital status: Married    Spouse name: Not on file   Number of children: 2   Years of education: Not on file   Highest education level: Not on file  Occupational History   Not on file  Tobacco Use   Smoking status: Former    Packs/day: 1.00    Years: 4.00    Total pack years: 4.00    Types:  Cigarettes    Quit date: 06/14/1967    Years since quitting: 54.5   Smokeless tobacco: Never  Substance and Sexual Activity   Alcohol use: No    Alcohol/week: 0.0 standard drinks of alcohol   Drug use: No   Sexual activity: Yes    Comment: lives with wife  Other Topics Concern   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     Social Determinants of Health   Financial Resource Strain: Low Risk  (02/10/2021)   Overall Financial Resource Strain (CARDIA)    Difficulty of Paying Living Expenses: Not hard at all  Food Insecurity: No Food Insecurity (02/10/2021)   Hunger Vital Sign    Worried About Running Out of Food in the Last Year: Never true    Ran Out of Food in the Last Year: Never true  Transportation Needs: No Transportation Needs (02/10/2021)   PRAPARE - Hydrologist (Medical): No    Lack of Transportation (Non-Medical): No  Physical Activity: Inactive (02/10/2021)   Exercise Vital Sign    Days of Exercise per Week: 0 days    Minutes of Exercise per Session: 0 min  Stress: No  Stress Concern Present (02/10/2021)   [place]    Feeling of Stress : Not at all  Social Connections: [place] (02/10/2021)   Social Connection and Isolation Panel [NHANES]    Frequency of Communication with Friends and Family: Not on file    Frequency of Social Gatherings with Friends and Family: More than three times a week    Attends Religious Services: More than 4 times per year    Active Member of [employer] or Organizations: Yes    Attends Music therapist: More than 4 times per year    Marital Status: Married  Human resources officer Violence: Not on file   Family History  Problem Relation Age of Onset   Hypertension Mother    Cancer Mother 36       ovarian   Heart disease Father        CHF   Heart attack Father 39   Cancer Sister         breast- just finished her last radiation   Heart disease Maternal Grandfather        ?   Heart attack Paternal Grandfather 41   Heart attack Paternal Uncle 77       Sudden death   Diabetes Neg Hx    Review of Systems  Constitutional:  Negative for fever and unexpected weight change.  HENT:  Negative for congestion, dental problem, ear pain, nosebleeds, postnasal drip, rhinorrhea, sinus pressure, sneezing, sore throat and trouble swallowing.   Eyes:  Negative for redness and itching.  Respiratory:  Negative for cough, chest tightness, shortness of breath and wheezing.   Cardiovascular:  Negative for palpitations and leg swelling.  Gastrointestinal:  Negative for nausea and vomiting.  Genitourinary:  Negative for dysuria.  Musculoskeletal:  Negative for joint swelling.  Skin:  Negative for rash.  Allergic/Immunologic: Negative.  Negative for environmental allergies, food allergies and immunocompromised state.  Neurological:  Negative for headaches.  Hematological:  Does not bruise/bleed easily.  Psychiatric/Behavioral:  Negative for dysphoric mood. The patient is not nervous/anxious.       Objective:   Physical Exam Constitutional:      Appearance: He is obese.  HENT:     Head: Normocephalic.     Nose: No congestion.     Mouth/Throat:     Comments: Mallampati 3, crowded oropharynx Eyes:     Extraocular Movements: Extraocular movements intact.     Pupils: Pupils are equal, round, and reactive to light.  Cardiovascular:     Rate and Rhythm: Normal rate and regular rhythm.     Heart sounds: No murmur heard.    No friction rub.  Pulmonary:     Effort: Pulmonary effort is normal. No respiratory distress.     Breath sounds: Normal breath sounds. No stridor. No wheezing or rhonchi.  Musculoskeletal:     Cervical back: No rigidity or tenderness. No muscular tenderness.  Neurological:     Mental Status: He is alert.  Psychiatric:        Mood and Affect: Mood normal.     Vitals:   12/29/21 1009  BP: 122/70  Pulse: 89  SpO2: 96%      02/15/2019    9:00 AM  Results of the Epworth flowsheet  Sitting and reading 1  Watching TV 1  Sitting, inactive in a public place (e.g. a theatre or a meeting) 0  As a passenger in a car for an hour without a break  0  Lying down to rest in the afternoon when circumstances permit 0  Sitting and talking to someone 0  Sitting quietly after a lunch without alcohol 0  In a car, while stopped for a few minutes in traffic 0  Total score 2   Compliance reviewed showing excellent compliance 100% compliance Average use of 7 hours 54 minutes Machine set at 12 Residual AHI 1.1     Assessment & Plan:  .  Obstructive sleep apnea -Sleep apnea is adequately controlled on CPAP therapy  Class II obesity -Encouraged to continue weight loss efforts  .  Coronary artery disease -Stable s/p stenting  Plan: .  Continue CPAP therapy  .  No changes required to pressures   .  Follow-up a year from now  Encouraged to call with significant concerns

## 2021-12-29 NOTE — Patient Instructions (Signed)
Sleep apnea appears well treated  Continue using CPAP nightly  I will see you a year from now  Call with significant concerns

## 2022-01-07 ENCOUNTER — Other Ambulatory Visit (HOSPITAL_BASED_OUTPATIENT_CLINIC_OR_DEPARTMENT_OTHER): Payer: Self-pay

## 2022-01-07 DIAGNOSIS — H35352 Cystoid macular degeneration, left eye: Secondary | ICD-10-CM | POA: Diagnosis not present

## 2022-01-07 MED ORDER — PREDNISOLONE ACETATE 1 % OP SUSP
1.0000 [drp] | Freq: Four times a day (QID) | OPHTHALMIC | 3 refills | Status: DC
  Filled 2022-01-07: qty 10, 30d supply, fill #0
  Filled 2022-04-30: qty 10, 30d supply, fill #1
  Filled 2022-06-14: qty 10, 30d supply, fill #2

## 2022-01-25 ENCOUNTER — Other Ambulatory Visit (INDEPENDENT_AMBULATORY_CARE_PROVIDER_SITE_OTHER): Payer: Medicare HMO

## 2022-01-25 ENCOUNTER — Other Ambulatory Visit (HOSPITAL_BASED_OUTPATIENT_CLINIC_OR_DEPARTMENT_OTHER): Payer: Self-pay

## 2022-01-25 DIAGNOSIS — E782 Mixed hyperlipidemia: Secondary | ICD-10-CM

## 2022-01-25 DIAGNOSIS — E559 Vitamin D deficiency, unspecified: Secondary | ICD-10-CM | POA: Diagnosis not present

## 2022-01-25 DIAGNOSIS — I10 Essential (primary) hypertension: Secondary | ICD-10-CM | POA: Diagnosis not present

## 2022-01-25 LAB — COMPREHENSIVE METABOLIC PANEL
ALT: 21 U/L (ref 0–53)
AST: 19 U/L (ref 0–37)
Albumin: 4.2 g/dL (ref 3.5–5.2)
Alkaline Phosphatase: 81 U/L (ref 39–117)
BUN: 15 mg/dL (ref 6–23)
CO2: 30 mEq/L (ref 19–32)
Calcium: 8.9 mg/dL (ref 8.4–10.5)
Chloride: 100 mEq/L (ref 96–112)
Creatinine, Ser: 0.85 mg/dL (ref 0.40–1.50)
GFR: 83.78 mL/min (ref >60.00)
Glucose, Bld: 221 mg/dL — ABNORMAL HIGH (ref 70–99)
Potassium: 4.5 mEq/L (ref 3.5–5.1)
Sodium: 136 mEq/L (ref 135–145)
Total Bilirubin: 0.8 mg/dL (ref 0.2–1.2)
Total Protein: 6.8 g/dL (ref 6.0–8.3)

## 2022-01-25 LAB — CBC
HCT: 44.9 % (ref 39.0–52.0)
Hemoglobin: 15 g/dL (ref 13.0–17.0)
MCHC: 33.4 g/dL (ref 30.0–36.0)
MCV: 94.2 fl (ref 78.0–100.0)
Platelets: 138 10*3/uL — ABNORMAL LOW (ref 150.0–400.0)
RBC: 4.77 Mil/uL (ref 4.22–5.81)
RDW: 13.9 % (ref 11.5–15.5)
WBC: 5.4 10*3/uL (ref 4.0–10.5)

## 2022-01-25 LAB — LIPID PANEL
Cholesterol: 129 mg/dL (ref 0–200)
HDL: 34.2 mg/dL — ABNORMAL LOW (ref >39.00)
LDL Cholesterol: 68 mg/dL (ref 0–99)
NonHDL: 94.81
Total CHOL/HDL Ratio: 4
Triglycerides: 133 mg/dL (ref 0.0–149.0)
VLDL: 26.6 mg/dL (ref 0.0–40.0)

## 2022-01-25 LAB — VITAMIN D 25 HYDROXY (VIT D DEFICIENCY, FRACTURES): VITD: 43.05 ng/mL (ref 30.00–100.00)

## 2022-01-25 LAB — TSH: TSH: 2.38 u[IU]/mL (ref 0.35–5.50)

## 2022-01-25 MED ORDER — COMIRNATY 30 MCG/0.3ML IM SUSY
PREFILLED_SYRINGE | INTRAMUSCULAR | 0 refills | Status: DC
  Filled 2022-01-25: qty 0.3, 1d supply, fill #0

## 2022-01-25 NOTE — Progress Notes (Unsigned)
Cardiology Office Note   Date:  01/27/2022   ID:  Michael Ensign Olafson Brooke Bonito., DOB [birth year], MRN [id number]  PCP:  Mosie Lukes, MD  Cardiologist:   Minus Breeding, MD   Chief Complaint  Patient presents with   Coronary Artery Disease       History of Present Illness: Michael Botts. is a 76 y.o. male who presents for follow-up of his known coronary disease.  Since I last saw him he has had no new cardiovascular complaints.  He still does a lot of chores around the house including vacuuming getting up leaves.  He has a exercise machine that he uses apparently sometimes.  He has none of the symptoms that he had at the time of his stenting.  He has no new shortness of breath, PND or orthopnea.  He has no new palpitations, presyncope or syncope.  He is denying any chest discomfort, neck or arm discomfort.    Past Medical History:  Diagnosis Date   CAD (coronary artery disease)    a. 05/2014 [place] s/p DES x4 to mid-distLAD and DESx1 to DI   Complication of anesthesia    11'12 Surgery with excrutiating "head about to explode" upon awakening  -lasted several hours postop  ( SURGERY DONE 08-13-2011 AT [facility] PT DID OK POST OP)   Diverticulosis of colon    LEFT SIDE   Exposure to Agent [facility] 10/18/2016   [place]   History of small bowel obstruction    X2  28 YRS AGO AND LAST ONE 04/2014--  RESOLVED WITHOUT SURGICAL INTERVENTION   Hyperlipidemia    Hypertension    Knee pain, bilateral 08/13/2011   Left ureteral calculus    Multiple lipomas    hx. mulitple and some remains -arms, legs   Obesity, unspecified 09/18/2013   Obstructive sleep apnea 01/01/2016   Recurrent kidney stones 07/08/2013   Follows with Dr Gaynelle Arabian He believes they are Calcium oxalate stones   Thrombocytopenia ([place]) 07/07/2014   Type 2 diabetes mellitus ([place])    Vitamin D deficiency 07/02/2014    Past Surgical History:  Procedure Laterality Date   COLONOSCOPY W/ POLYPECTOMY  LAST ONE 2013   CORONARY STENT  PLACEMENT  05/17/2014   PROXIMAL X 4  MID & DISTAL LAD  DIAGONAL    KNEE ARTHROSCOPY  08/13/2011   right ARTHROSCOPY KNEE;  Surgeon: Gearlean Alf, MD;  Location: [facility];  Service: Orthopedics;  Laterality: Right;  medial  meniscal debridement, excision of plica, synovectomy   KNEE ARTHROSCOPY Left 11/16/2013   Procedure: LEFT KNEE ARTHROSCOPY MEDIAL MENISCAL DEBRIDEMENT, CHONDROPLASTY;  Surgeon: Gearlean Alf, MD;  Location: [facility];  Service: Orthopedics;  Laterality: Left;   KNEE ARTHROSCOPY W/ MENISCECTOMY Right 01-29-2011   twice, first trimmed mediscus and cyst   LEFT HEART CATHETERIZATION WITH CORONARY ANGIOGRAM N/A 05/17/2014   Procedure: LEFT HEART CATHETERIZATION WITH CORONARY ANGIOGRAM;  Surgeon: Burnell Blanks, MD;  Location[facility] C[facility] LAB;  Service: Cardiovascular;  Laterality: N/A;   LIPOMA EXCISION  1975   multiple, 10-12   PERCUTANEOUS CORONARY STENT INTERVENTION (PCI-S)  05/17/2014   Procedure: PERCUTANEOUS CORONARY STENT INTERVENTION (PCI-S);  Surgeon: Burnell Blanks, MD;  Location: [facility] CATH LAB;  Service: Cardiovascular;;  Diag1 and Prox to Distal LAD     Current Outpatient Medications  Medication Sig Dispense Refill   Accu-Chek FastClix Lancets MISC Use to test blood sugar 2 times daily. Dx: E11.40 204 each 5   acetaminophen (TYLENOL)  500 MG tablet Take 1,000 mg by mouth every 6 (six) hours as needed for mild pain. Reported on 03/31/2015     aspirin EC 81 MG tablet Take 81 mg by mouth daily with breakfast.      cetirizine (ZYRTEC) 10 MG tablet Take 1 tablet (10 mg total) by mouth 2 (two) times daily as needed for allergies. 100 tablet 3   Cholecalciferol 50 MCG (2000 UT) TABS Take 2 tablets by mouth daily.     COVID-19 mRNA bivalent vaccine, Pfizer, (PFIZER COVID-19 VAC BIVALENT) injection Inject into the muscle. 0.3 mL 0   COVID-19 mRNA Vac-TriS, Pfizer, (PFIZER-BIONT COVID-19 VAC-TRIS) SUSP injection Inject into the muscle. 0.3 mL 0   COVID-19 mRNA vaccine  2023-2024 (COMIRNATY) syringe Inject into the muscle. 0.3 mL 0   glucagon (GLUCAGON EMERGENCY) 1 MG injection Inject 1 mg into the muscle once as needed for up to 1 dose. 1 each 12   glucose-Vitamin C 4-0.006 GM CHEW chewable tablet CHEW FOUR TABLETS BY MOUTH  AS NEEDED (REPEAT EVERY 15 MINUTES IF BLOOD SUGAR LESS THAN 70)     insulin aspart (NOVOLOG) 100 UNIT/ML injection Inject 15 Units into the skin 3 (three) times daily before meals. Inject in skin before meals     insulin glargine (LANTUS) 100 UNIT/ML injection Inject 40-50 Units into the skin daily.     losartan (COZAAR) 100 MG tablet Take 1 tablet (100 mg total) by mouth daily. (Patient taking differently: Take 75 mg by mouth daily.) 90 tablet 0   metoprolol tartrate (LOPRESSOR) 25 MG tablet Take 1 tablet (25 mg total) by mouth 2 (two) times daily. 180 tablet 3   Multiple Vitamin (MULTIVITAMIN WITH MINERALS) TABS tablet Take 1 tablet by mouth daily.     nitroGLYCERIN (NITROSTAT) 0.4 MG SL tablet Place 1 tablet (0.4 mg total) under the tongue every 5 (five) minutes as needed for chest pain. 25 tablet 3   prasugrel (EFFIENT) 10 MG TABS tablet Take 1 tablet (10 mg total) by mouth daily. 90 tablet 3   prednisoLONE acetate (PRED FORTE) 1 % ophthalmic suspension Place 1 drop into the left eye 4 (four) times daily until November 9. Then start placing 1 drop into the left eye 3 (three) times daily until changed. 10 mL 3   simvastatin (ZOCOR) 40 MG tablet TAKE 1 TABLET (40 MG) BY MOUTH AT BEDTIME. 90 tablet 2   No current facility-administered medications for this visit.    Allergies:   Ciprofloxacin, Lipitor [atorvastatin calcium], Lisinopril, and Metformin and related    ROS:  Please see the history of present illness.   Otherwise, review of systems are positive for none.   All other systems are reviewed and negative.    PHYSICAL EXAM: VS:  BP 128/70 (BP Location: Right Arm, Patient Position: Sitting, Cuff Size: Large)   Pulse (!) 45   Ht 5'  10" (1.778 m)   Wt 243 lb 4.8 oz (110.4 kg)   SpO2 94%   BMI 34.91 kg/m  , BMI Body mass index is 34.91 kg/m. GENERAL:  Well appearing NECK:  No jugular venous distention, waveform within normal limits, carotid upstroke brisk and symmetric, no bruits, no thyromegaly LUNGS:  Clear to auscultation bilaterally CHEST:  Unremarkable HEART:  PMI not displaced or sustained,S1 and S2 within normal limits, no S3, no S4, no clicks, no rubs, no murmurs ABD:  Flat, positive bowel sounds normal in frequency in pitch, no bruits, no rebound, no guarding, no midline pulsatile mass,  no hepatomegaly, no splenomegaly EXT:  2 plus pulses throughout, no edema, no cyanosis no clubbing   EKG:  EKG is  ordered today. The ekg ordered today demonstrates sinus rhythm, right bundle branch block, rate 45, axis within normal limits, intervals within normal limits, no acute ST-T wave changes.  Right bundle branch block is new compared to previous   Recent Labs: 01/25/2022: ALT 21; BUN 15; Creatinine, Ser 0.85; Hemoglobin 15.0; Platelets 138.0; Potassium 4.5; Sodium 136; TSH 2.38    Lipid Panel    Component Value Date/Time   CHOL 129 01/25/2022 0813   TRIG 133.0 01/25/2022 0813   HDL 34.20 (L) 01/25/2022 0813   CHOLHDL 4 01/25/2022 0813   VLDL 26.6 01/25/2022 0813   LDLCALC 68 01/25/2022 0813   LDLCALC 98 11/21/2019 0827   LDLDIRECT 108.0 04/16/2014 1022      Wt Readings from Last 3 Encounters:  01/27/22 243 lb 4.8 oz (110.4 kg)  12/29/21 244 lb 6.4 oz (110.9 kg)  10/01/21 246 lb 6.4 oz (111.8 kg)      Other studies Reviewed: Additional studies/ records that were reviewed today include: Labs Review of the above records demonstrates:  See elsewhere  ASSESSMENT AND PLAN:   CAD/PCI/DES:  The patient has no new sypmtoms.  No further cardiovascular testing is indicated.  We will continue with aggressive risk reduction and meds as listed.  HTN:  The blood pressure is at target.  No change in therapy.     OVERWEIGHT:    I gave him a goal of getting down to 230 pounds 3 pounds per month and we talked about how to do this.    DYSLIPIDEMIA:  His LDL was 68 with an HDL of 34.  No change in therapy.   DM: A1c slightly elevated at 8.0 which is up from previous.  However, he is also had hypoglycemia.  I will defer management to the [facility] endocrinologist.   BRADYCARDIA: He does have right bundle branch block and this has been intermittent over time and possibly rate related.  No change in therapy.   Current medicines are reviewed at length with the patient today.  The patient does not have concerns regarding medicines.  The following changes have been made:  None  Labs/ tests ordered today include:  None  Orders Placed This Encounter  Procedures   EKG 12-Lead    Disposition:   FU with me in 12 months   Signed, Minus Breeding, MD  01/27/2022 12:53 PM    [place]

## 2022-01-27 ENCOUNTER — Encounter: Payer: Self-pay | Admitting: Cardiology

## 2022-01-27 ENCOUNTER — Ambulatory Visit: Payer: Medicare HMO | Attending: Cardiology | Admitting: Cardiology

## 2022-01-27 VITALS — BP 128/70 | HR 45 | Ht 70.0 in | Wt 243.3 lb

## 2022-01-27 DIAGNOSIS — E118 Type 2 diabetes mellitus with unspecified complications: Secondary | ICD-10-CM

## 2022-01-27 DIAGNOSIS — E785 Hyperlipidemia, unspecified: Secondary | ICD-10-CM | POA: Diagnosis not present

## 2022-01-27 DIAGNOSIS — I2510 Atherosclerotic heart disease of native coronary artery without angina pectoris: Secondary | ICD-10-CM

## 2022-01-27 DIAGNOSIS — I10 Essential (primary) hypertension: Secondary | ICD-10-CM | POA: Diagnosis not present

## 2022-01-27 NOTE — Patient Instructions (Addendum)
Medication Instructions:   No changes  *If you need a refill on your cardiac medications before your next appointment, please call your pharmacy*   Lab Work: Not needed    Testing/Procedures: Not needed   Follow-Up: At [facility], you and your health needs are our priority.  As part of our continuing mission to provide you with exceptional heart care, we have created designated Provider Care Teams.  These Care Teams include your primary Cardiologist (physician) and Advanced Practice Providers (APPs -  Physician Assistants and Nurse Practitioners) who all work together to provide you with the care you need, when you need it.     Your next appointment:   12 month(s)  The format for your next appointment:   In Person  Provider:   Minus Breeding, MD    Other Instructions  Your physician discussed the importance of regular exercise and recommended that you start or continue a regular exercise program for good health.

## 2022-01-31 NOTE — Assessment & Plan Note (Signed)
Tolerating statin, encouraged heart healthy diet, avoid trans fats, minimize simple carbs and saturated fats. Increase exercise as tolerated 

## 2022-01-31 NOTE — Assessment & Plan Note (Signed)
Well controlled, no changes to meds. Encouraged heart healthy diet such as the DASH diet and exercise as tolerated.  °

## 2022-01-31 NOTE — Assessment & Plan Note (Signed)
Stable, no recent use of NTG

## 2022-01-31 NOTE — Assessment & Plan Note (Signed)
Supplement and monitor 

## 2022-01-31 NOTE — Assessment & Plan Note (Signed)
Encouraged DASH or MIND diet, decrease po intake and increase exercise as tolerated. Needs 7-8 hours of sleep nightly. Avoid trans fats, eat small, frequent meals every 4-5 hours with lean proteins, complex carbs and healthy fats. Minimize simple carbs, high fat foods and processed foods 

## 2022-02-01 ENCOUNTER — Ambulatory Visit (INDEPENDENT_AMBULATORY_CARE_PROVIDER_SITE_OTHER): Payer: Medicare HMO | Admitting: Family Medicine

## 2022-02-01 VITALS — BP 136/72 | HR 56 | Temp 97.5°F | Resp 16 | Ht 70.0 in | Wt 244.2 lb

## 2022-02-01 DIAGNOSIS — E669 Obesity, unspecified: Secondary | ICD-10-CM | POA: Diagnosis not present

## 2022-02-01 DIAGNOSIS — E559 Vitamin D deficiency, unspecified: Secondary | ICD-10-CM | POA: Diagnosis not present

## 2022-02-01 DIAGNOSIS — I2510 Atherosclerotic heart disease of native coronary artery without angina pectoris: Secondary | ICD-10-CM

## 2022-02-01 DIAGNOSIS — I10 Essential (primary) hypertension: Secondary | ICD-10-CM | POA: Diagnosis not present

## 2022-02-01 DIAGNOSIS — H269 Unspecified cataract: Secondary | ICD-10-CM | POA: Diagnosis not present

## 2022-02-01 DIAGNOSIS — E1142 Type 2 diabetes mellitus with diabetic polyneuropathy: Secondary | ICD-10-CM | POA: Diagnosis not present

## 2022-02-01 DIAGNOSIS — E782 Mixed hyperlipidemia: Secondary | ICD-10-CM

## 2022-02-01 NOTE — Progress Notes (Signed)
Subjective:   By signing my name below, I, Michael Cunningham, attest that this documentation has been prepared under the direction and in the presence of Willette Alma MD, 02/01/2022    Patient ID: Michael Gavel., male    DOB: 08/23/1945, 76 y.o.   MRN: [id number]  Chief Complaint  Patient presents with   Follow-up    Follow up    HPI Patient is in today for an office visit. He is accompanied by his wife.   Cataract (Left Eye)  He reports of left eye cataract surgical removal. There was pressure found in the area that was later stabilized using antibiotic eyedrops. Swelling later occurred and is continuing to take antibiotics. He is waiting for his left eye to stabilize before receiving cataract surgery in his right eye. As of today's visit, he states that his vision is well in his left eye. However, he is continuing to wear readers.   A1C Continuing to go to the [place] for his A1C. He reports a low sugar of 68 mg/dL and 81 mg/dL with a stick. He also reports a high of 268 mg/dL. He reports a glucose range of 70-180 mg/dL 79% of the time Lab Results  Component Value Date   HGBA1C 8.0 (H) 09/24/2021   Immunizations He is UTD on the pneumonia, Covid vaccine and influenza vaccine.   He reports no recent febrile illness or hospitalizations. Denies CP/ palp/ SOB/ HA/ congestion/fevers/GI or GU c/o. Taking meds as prescribed.   Past Medical History:  Diagnosis Date   CAD (coronary artery disease)    a. 05/2014 [place] s/p DES x4 to mid-distLAD and DESx1 to DI   Complication of anesthesia    11'12 Surgery with excrutiating "head about to explode" upon awakening  -lasted several hours postop  ( SURGERY DONE 08-13-2011 AT [facility] PT DID OK POST OP)   Diverticulosis of colon    LEFT SIDE   Exposure to Agent [facility] 10/18/2016   [place]   History of small bowel obstruction    X2  28 YRS AGO AND LAST ONE 04/2014--  RESOLVED WITHOUT SURGICAL INTERVENTION   Hyperlipidemia    Hypertension    Knee pain,  bilateral 08/13/2011   Left ureteral calculus    Multiple lipomas    hx. mulitple and some remains -arms, legs   Obesity, unspecified 09/18/2013   Obstructive sleep apnea 01/01/2016   Recurrent kidney stones 07/08/2013   Follows with Dr Gaynelle Arabian He believes they are Calcium oxalate stones   Thrombocytopenia ([place]) 07/07/2014   Type 2 diabetes mellitus ([place])    Vitamin D deficiency 07/02/2014    Past Surgical History:  Procedure Laterality Date   COLONOSCOPY W/ POLYPECTOMY  LAST ONE 2013   CORONARY STENT PLACEMENT  05/17/2014   PROXIMAL X 4  MID & DISTAL LAD  DIAGONAL    KNEE ARTHROSCOPY  08/13/2011   right ARTHROSCOPY KNEE;  Surgeon: Gearlean Alf, MD;  Location: [facility];  Service: Orthopedics;  Laterality: Right;  medial  meniscal debridement, excision of plica, synovectomy   KNEE ARTHROSCOPY Left 11/16/2013   Procedure: LEFT KNEE ARTHROSCOPY MEDIAL MENISCAL DEBRIDEMENT, CHONDROPLASTY;  Surgeon: Gearlean Alf, MD;  Location: [facility];  Service: Orthopedics;  Laterality: Left;   KNEE ARTHROSCOPY W/ MENISCECTOMY Right 01-29-2011   twice, first trimmed mediscus and cyst   LEFT HEART CATHETERIZATION WITH CORONARY ANGIOGRAM N/A 05/17/2014   Procedure: LEFT HEART CATHETERIZATION WITH CORONARY ANGIOGRAM;  Surgeon: Burnell Blanks, MD;  Location: [facility] CATH  LAB;  Service: Cardiovascular;  Laterality: N/A;   [place]   multiple, 10-12   PERCUTANEOUS CORONARY STENT INTERVENTION (PCI-S)  05/17/2014   Procedure: PERCUTANEOUS CORONARY STENT INTERVENTION (PCI-S);  Surgeon: Burnell Blanks, MD;  Location[facility] CATH LAB;  Service: Cardiovascular;;  Diag1 and Prox to Distal LAD    Family History  Problem Relation Age of Onset   Hypertension Mother    Cancer Mother 96       ovarian   Heart disease Father        CHF   Heart attack Father 36   Cancer Sister        breast- just finished her last radiation   Heart disease Maternal Grandfather        ?   Heart attack Paternal  Grandfather 65   Heart attack Paternal Uncle 52       Sudden death   Diabetes Neg Hx     Social History   Socioeconomic History   Marital status: Married    Spouse name: Not on file   Number of children: 2   Years of education: Not on file   Highest education level: Not on file  Occupational History   Not on file  Tobacco Use   Smoking status: Former    Packs/day: 1.00    Years: 4.00    Total pack years: 4.00    Types: Cigarettes    Quit date: 06/14/1967    Years since quitting: 54.6   Smokeless tobacco: Never  Substance and Sexual Activity   Alcohol use: No    Alcohol/week: 0.0 standard drinks of alcohol   Drug use: No   Sexual activity: Yes    Comment: lives with wife  Other Topics Concern   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     Social Determinants of Health   Financial Resource Strain: Low Risk  (02/10/2021)   Overall Financial Resource Strain (CARDIA)    Difficulty of Paying Living Expenses: Not hard at all  Food Insecurity: No Food Insecurity (02/10/2021)   Hunger Vital Sign    Worried About Running Out of Food in the Last Year: Never true    Ran Out of Food in the Last Year: Never true  Transportation Needs: No Transportation Needs (02/10/2021)   PRAPARE - Hydrologist (Medical): No    Lack of Transportation (Non-Medical): No  Physical Activity: Inactive (02/10/2021)   Exercise Vital Sign    Days of Exercise per Week: 0 days    Minutes of Exercise per Session: 0 min  Stress: No Stress Concern Present (02/10/2021)   [place]    Feeling of Stress : Not at all  Social Connections: [place] (02/10/2021)   Social Connection and Isolation Panel [NHANES]    Frequency of Communication with Friends and Family: Not on file    Frequency of Social Gatherings with Friends and Family: More than three times  a week    Attends Religious Services: More than 4 times per year    Active Member of [employer] or Organizations: Yes    Attends Music therapist: More than 4 times per year    Marital Status: Married  Human resources officer Violence: Not on file    Outpatient Medications Prior to Visit  Medication Sig Dispense Refill   Accu-Chek FastClix Lancets MISC Use to test blood sugar  2 times daily. Dx: E11.40 204 each 5   acetaminophen (TYLENOL) 500 MG tablet Take 1,000 mg by mouth every 6 (six) hours as needed for mild pain. Reported on 03/31/2015     aspirin EC 81 MG tablet Take 81 mg by mouth daily with breakfast.      cetirizine (ZYRTEC) 10 MG tablet Take 1 tablet (10 mg total) by mouth 2 (two) times daily as needed for allergies. 100 tablet 3   Cholecalciferol 50 MCG (2000 UT) TABS Take 2 tablets by mouth daily.     glucagon (GLUCAGON EMERGENCY) 1 MG injection Inject 1 mg into the muscle once as needed for up to 1 dose. 1 each 12   glucose-Vitamin C 4-0.006 GM CHEW chewable tablet CHEW FOUR TABLETS BY MOUTH  AS NEEDED (REPEAT EVERY 15 MINUTES IF BLOOD SUGAR LESS THAN 70)     insulin aspart (NOVOLOG) 100 UNIT/ML injection Inject 15 Units into the skin 3 (three) times daily before meals. Inject in skin before meals     insulin glargine (LANTUS) 100 UNIT/ML injection Inject 40-50 Units into the skin daily.     losartan (COZAAR) 100 MG tablet Take 1 tablet (100 mg total) by mouth daily. (Patient taking differently: Take 75 mg by mouth daily.) 90 tablet 0   metoprolol tartrate (LOPRESSOR) 25 MG tablet Take 1 tablet (25 mg total) by mouth 2 (two) times daily. 180 tablet 3   Multiple Vitamin (MULTIVITAMIN WITH MINERALS) TABS tablet Take 1 tablet by mouth daily.     nitroGLYCERIN (NITROSTAT) 0.4 MG SL tablet Place 1 tablet (0.4 mg total) under the tongue every 5 (five) minutes as needed for chest pain. 25 tablet 3   prasugrel (EFFIENT) 10 MG TABS tablet Take 1 tablet (10 mg total) by mouth daily. 90  tablet 3   prednisoLONE acetate (PRED FORTE) 1 % ophthalmic suspension Place 1 drop into the left eye 4 (four) times daily until November 9. Then start placing 1 drop into the left eye 3 (three) times daily until changed. 10 mL 3   simvastatin (ZOCOR) 40 MG tablet TAKE 1 TABLET (40 MG) BY MOUTH AT BEDTIME. 90 tablet 2   COVID-19 mRNA bivalent vaccine, Pfizer, (PFIZER COVID-19 VAC BIVALENT) injection Inject into the muscle. 0.3 mL 0   COVID-19 mRNA Vac-TriS, Pfizer, (PFIZER-BIONT COVID-19 VAC-TRIS) SUSP injection Inject into the muscle. 0.3 mL 0   COVID-19 mRNA vaccine 2023-2024 (COMIRNATY) syringe Inject into the muscle. 0.3 mL 0   No facility-administered medications prior to visit.    Allergies  Allergen Reactions   Ciprofloxacin Other (See Comments)    Abdominal pain   Lipitor [Atorvastatin Calcium] Other (See Comments)    Severe muscle pain   Lisinopril Cough   Metformin And Related Diarrhea    Review of Systems  Constitutional:  Negative for fever.  HENT:  Negative for congestion.   Respiratory:  Negative for shortness of breath.   Cardiovascular:  Negative for chest pain and palpitations.  Gastrointestinal: Negative.   Genitourinary: Negative.   Neurological:  Negative for headaches.       Objective:    Physical Exam Constitutional:      General: He is not in acute distress.    Appearance: Normal appearance. He is not ill-appearing.  HENT:     Head: Normocephalic and atraumatic.     Right Ear: External ear normal.     Left Ear: External ear normal.  Eyes:     Extraocular Movements: Extraocular movements intact.  Pupils: Pupils are equal, round, and reactive to light.  Cardiovascular:     Rate and Rhythm: Normal rate and regular rhythm.     Heart sounds: Normal heart sounds. No murmur heard.    No gallop.  Pulmonary:     Effort: Pulmonary effort is normal. No respiratory distress.     Breath sounds: Normal breath sounds. No wheezing or rales.  Abdominal:      General: Bowel sounds are normal.     Tenderness: There is no abdominal tenderness.  Musculoskeletal:     Right lower leg: No edema.     Left lower leg: No edema.  Skin:    General: Skin is warm and dry.  Neurological:     Mental Status: He is alert and oriented to person, place, and time.  Psychiatric:        Judgment: Judgment normal.     BP 136/72 (BP Location: Right Arm, Patient Position: Sitting, Cuff Size: Normal)   Pulse (!) 56   Temp (!) 97.5 F (36.4 C) (Oral)   Resp 16   Ht '5\' 10"'$  (1.778 m)   Wt 244 lb 3.2 oz (110.8 kg)   SpO2 95%   BMI 35.04 kg/m  Wt Readings from Last 3 Encounters:  02/01/22 244 lb 3.2 oz (110.8 kg)  01/27/22 243 lb 4.8 oz (110.4 kg)  12/29/21 244 lb 6.4 oz (110.9 kg)    Diabetic Foot Exam - Simple   No data filed    Lab Results  Component Value Date   WBC 5.4 01/25/2022   HGB 15.0 01/25/2022   HCT 44.9 01/25/2022   PLT 138.0 (L) 01/25/2022   GLUCOSE 221 (H) 01/25/2022   CHOL 129 01/25/2022   TRIG 133.0 01/25/2022   HDL 34.20 (L) 01/25/2022   LDLDIRECT 108.0 04/16/2014   LDLCALC 68 01/25/2022   ALT 21 01/25/2022   AST 19 01/25/2022   NA 136 01/25/2022   K 4.5 01/25/2022   CL 100 01/25/2022   CREATININE 0.85 01/25/2022   BUN 15 01/25/2022   CO2 30 01/25/2022   TSH 2.38 01/25/2022   PSA 1.92 09/25/2018   INR 1.02 05/17/2014   HGBA1C 8.0 (H) 09/24/2021   MICROALBUR 1.7 06/24/2015    Lab Results  Component Value Date   TSH 2.38 01/25/2022   Lab Results  Component Value Date   WBC 5.4 01/25/2022   HGB 15.0 01/25/2022   HCT 44.9 01/25/2022   MCV 94.2 01/25/2022   PLT 138.0 (L) 01/25/2022   Lab Results  Component Value Date   NA 136 01/25/2022   K 4.5 01/25/2022   CO2 30 01/25/2022   GLUCOSE 221 (H) 01/25/2022   BUN 15 01/25/2022   CREATININE 0.85 01/25/2022   BILITOT 0.8 01/25/2022   ALKPHOS 81 01/25/2022   AST 19 01/25/2022   ALT 21 01/25/2022   PROT 6.8 01/25/2022   ALBUMIN 4.2 01/25/2022   CALCIUM 8.9  01/25/2022   ANIONGAP 8 05/18/2014   GFR 83.78 01/25/2022   Lab Results  Component Value Date   CHOL 129 01/25/2022   Lab Results  Component Value Date   HDL 34.20 (L) 01/25/2022   Lab Results  Component Value Date   LDLCALC 68 01/25/2022   Lab Results  Component Value Date   TRIG 133.0 01/25/2022   Lab Results  Component Value Date   CHOLHDL 4 01/25/2022   Lab Results  Component Value Date   HGBA1C 8.0 (H) 09/24/2021  Assessment & Plan:   Problem List Items Addressed This Visit     Hypertension - Primary (Chronic)    Well controlled, no changes to meds. Encouraged heart healthy diet such as the DASH diet and exercise as tolerated.        Hyperlipidemia    Tolerating statin, encouraged heart healthy diet, avoid trans fats, minimize simple carbs and saturated fats. Increase exercise as tolerated       CAD (coronary artery disease)    Stable, no recent use of NTG      Vitamin D deficiency    Supplement and monitor       Well controlled type 2 diabetes mellitus with peripheral neuropathy (HCC)    hgba1c acceptable, minimize simple carbs. Increase exercise as tolerated. Continue current meds. He notes he has increased his exercise and his time with sugar between 70-180 has improved to 79% of the time. Reminded to increase protein and minimize carbs.       Obesity (BMI 30-39.9)    Encouraged DASH or MIND diet, decrease po intake and increase exercise as tolerated. Needs 7-8 hours of sleep nightly. Avoid trans fats, eat small, frequent meals every 4-5 hours with lean proteins, complex carbs and healthy fats. Minimize simple carbs, high fat foods and processed foods       Cataracts, bilateral    He has had his left cataract removed but developed some complications and is now being treated by Dr Ellie Lunch ophthalmology prior to proceeding with his right one.       No orders of the defined types were placed in this encounter.   I, Penni Homans, MD,  personally preformed the services described in this documentation.  All medical record entries made by the scribe were at my direction and in my presence.  I have reviewed the chart and discharge instructions (if applicable) and agree that the record reflects my personal performance and is accurate and complete. 02/01/2022   I,Amber Collins,acting as a scribe for Penni Homans, MD.,have documented all relevant documentation on the behalf of Penni Homans, MD,as directed by  Penni Homans, MD while in the presence of Penni Homans, MD.    Penni Homans, MD

## 2022-02-01 NOTE — Assessment & Plan Note (Signed)
hgba1c acceptable, minimize simple carbs. Increase exercise as tolerated. Continue current meds. He notes he has increased his exercise and his time with sugar between 70-180 has improved to 79% of the time. Reminded to increase protein and minimize carbs.

## 2022-02-01 NOTE — Patient Instructions (Signed)

## 2022-02-01 NOTE — Assessment & Plan Note (Signed)
He has had his left cataract removed but developed some complications and is now being treated by Dr Ellie Lunch ophthalmology prior to proceeding with his right one.

## 2022-02-02 DIAGNOSIS — H35352 Cystoid macular degeneration, left eye: Secondary | ICD-10-CM | POA: Diagnosis not present

## 2022-02-09 ENCOUNTER — Ambulatory Visit: Payer: Medicare HMO | Admitting: Podiatry

## 2022-02-09 DIAGNOSIS — E1149 Type 2 diabetes mellitus with other diabetic neurological complication: Secondary | ICD-10-CM

## 2022-02-09 DIAGNOSIS — B351 Tinea unguium: Secondary | ICD-10-CM

## 2022-02-09 DIAGNOSIS — M79676 Pain in unspecified toe(s): Secondary | ICD-10-CM

## 2022-02-09 NOTE — Progress Notes (Unsigned)
Subjective: Chief Complaint  Patient presents with   Diabetes    Diabetic foot care, A1c-8.0 BG- 178, Nail trim     76 y.o. returns the office today for painful, elongated, thickened toenails which he cannot trim himself. Denies any redness or drainage around the nails.  No open lesions he reports no new concerns today.  PCP: Mosie Lukes, MD -last seen November 2025. Endocrinologist: Dr. Benjiman Core, MD   Last A1c was 8.0 on September 24, 2021  Objective: AAO 3, NAD DP/PT pulses palpable, CRT less than 3 seconds Sensation is mildly decreased with SWM but overall no change.  Neuropathy symptoms are unchanged. Nails hypertrophic, dystrophic, elongated, brittle, discolored 10. There is tenderness overlying the nails 1-5 bilaterally. There is no surrounding erythema or drainage along the nail sites. Dried blood on the left 5th toe, corn No pain with calf compression, swelling, warmth, erythema.  Assessment: Patient presents with symptomatic onychomycosis  Plan: -Treatment options including alternatives, risks, complications were discussed -Nails sharply debrided 10 without complication/bleeding. -Discussed daily foot inspection. If there are any changes, to call the office immediately.  -Follow-up as scheduled or sooner if any problems are to arise. In the meantime, encouraged to call the office with any questions, concerns, changes symptoms.  Return in about 9 weeks (around 04/13/2022).  Celesta Gentile, DPM

## 2022-02-10 ENCOUNTER — Ambulatory Visit (INDEPENDENT_AMBULATORY_CARE_PROVIDER_SITE_OTHER): Payer: Medicare HMO | Admitting: *Deleted

## 2022-02-10 DIAGNOSIS — Z0000 Encounter for general adult medical examination without abnormal findings: Secondary | ICD-10-CM | POA: Diagnosis not present

## 2022-02-10 NOTE — Progress Notes (Signed)
Subjective:   Michael Cunningham. is a 76 y.o. male who presents for Medicare Annual/Subsequent preventive examination.  I connected with  [name] on 02/10/22 by a audio enabled telemedicine application and verified that I am speaking with the correct person using two identifiers.  Patient Location: Home  Provider Location: Office/Clinic  I discussed the limitations of evaluation and management by telemedicine. The patient expressed understanding and agreed to proceed.   Review of Systems    Defer to PCP Cardiac Risk Factors include: advanced age (>53mn, >>18women);diabetes mellitus;dyslipidemia;hypertension;male gender     Objective:    There were no vitals filed for this visit. There is no height or weight on file to calculate BMI.     02/10/2022    1:42 PM 10/07/2021    3:54 PM 02/10/2021    3:31 PM 10/04/2019    1:12 PM 09/26/2018   10:17 AM 09/20/2017   10:13 AM 09/16/2016   10:01 AM  Advanced Directives  Does Patient Have a Medical Advance Directive? Yes No Yes Yes Yes Yes Yes  Type of AParamedicof AAlderLiving will  [place]Living will [place] will H[place] CenterLiving will HWet Camp VillageLiving will H[place]Living will  Does patient want to make changes to medical advance directive? No - Patient declined   No - Patient declined No - Patient declined No - Patient declined   Copy of HBethle[place] Villagein Chart? Yes - validated most recent copy scanned in chart (See row information)  Yes - validated most recent copy scanned in chart (See row information) Yes - validated most recent copy scanned in chart (See row information) Yes - validated most recent copy scanned in chart (See row information) Yes No - copy requested    Current Medications (verified) Outpatient Encounter Medications as of 02/10/2022  Medication Sig   Accu-Chek  FastClix Lancets MISC Use to test blood sugar 2 times daily. Dx: E11.40   acetaminophen (TYLENOL) 500 MG tablet Take 1,000 mg by mouth every 6 (six) hours as needed for mild pain. Reported on 03/31/2015   aspirin EC 81 MG tablet Take 81 mg by mouth daily with breakfast.    cetirizine (ZYRTEC) 10 MG tablet Take 1 tablet (10 mg total) by mouth 2 (two) times daily as needed for allergies.   Cholecalciferol 50 MCG (2000 UT) TABS Take 2 tablets by mouth daily.   glucagon (GLUCAGON EMERGENCY) 1 MG injection Inject 1 mg into the muscle once as needed for up to 1 dose.   glucose-Vitamin C 4-0.006 GM CHEW chewable tablet CHEW FOUR TABLETS BY MOUTH  AS NEEDED (REPEAT EVERY 15 MINUTES IF BLOOD SUGAR LESS THAN 70)   insulin aspart (NOVOLOG) 100 UNIT/ML injection Inject 15 Units into the skin 3 (three) times daily before meals. Inject in skin before meals   insulin glargine (LANTUS) 100 UNIT/ML injection Inject 40-50 Units into the skin daily.   losartan (COZAAR) 100 MG tablet Take 1 tablet (100 mg total) by mouth daily. (Patient taking differently: Take 75 mg by mouth daily.)   metoprolol tartrate (LOPRESSOR) 25 MG tablet Take 1 tablet (25 mg total) by mouth 2 (two) times daily.   Multiple Vitamin (MULTIVITAMIN WITH MINERALS) TABS tablet Take 1 tablet by mouth daily.   nitroGLYCERIN (NITROSTAT) 0.4 MG SL tablet Place 1 tablet (0.4 mg total) under the tongue every 5 (five) minutes as needed for chest pain.  prasugrel (EFFIENT) 10 MG TABS tablet Take 1 tablet (10 mg total) by mouth daily.   prednisoLONE acetate (PRED FORTE) 1 % ophthalmic suspension Place 1 drop into the left eye 4 (four) times daily until November 9. Then start placing 1 drop into the left eye 3 (three) times daily until changed.   simvastatin (ZOCOR) 40 MG tablet TAKE 1 TABLET (40 MG) BY MOUTH AT BEDTIME.   [DISCONTINUED] famotidine (PEPCID) 20 MG tablet Take 1 tablet (20 mg total) by mouth at bedtime. (Patient not taking: Reported on 03/17/2020)    No facility-administered encounter medications on file as of 02/10/2022.    Allergies (verified) Ciprofloxacin, Lipitor [atorvastatin calcium], Lisinopril, and Metformin and related   History: Past Medical History:  Diagnosis Date   CAD (coronary artery disease)    a. 05/2014 [place] s/p DES x4 to mid-distLAD and DESx1 to DI   Complication of anesthesia    11'12 Surgery with excrutiating "head about to explode" upon awakening  -lasted several hours postop  ( SURGERY DONE 08-13-2011 AT [facility] PT DID OK POST OP)   Diverticulosis of colon    LEFT SIDE   Exposure to Agent [facility] 10/18/2016   [place]   History of small bowel obstruction    X2  28 YRS AGO AND LAST ONE 04/2014--  RESOLVED WITHOUT SURGICAL INTERVENTION   Hyperlipidemia    Hypertension    Knee pain, bilateral 08/13/2011   Left ureteral calculus    Multiple lipomas    hx. mulitple and some remains -arms, legs   Obesity, unspecified 09/18/2013   Obstructive sleep apnea 01/01/2016   Recurrent kidney stones 07/08/2013   Follows with Dr Gaynelle Arabian He believes they are Calcium oxalate stones   Thrombocytopenia ([place]) 07/07/2014   Type 2 diabetes mellitus ([place])    Vitamin D deficiency 07/02/2014   Past Surgical History:  Procedure Laterality Date   COLONOSCOPY W/ POLYPECTOMY  LAST ONE 2013   CORONARY STENT PLACEMENT  05/17/2014   PROXIMAL X 4  MID & DISTAL LAD  DIAGONAL    KNEE ARTHROSCOPY  08/13/2011   right ARTHROSCOPY KNEE;  Surgeon: Gearlean Alf, MD;  Location: [facility] ORS;  Service: Orthopedics;  Laterality: Right;  medial  meniscal debridement, excision of plica, synovectomy   KNEE ARTHROSCOPY Left 11/16/2013   Procedure: LEFT KNEE ARTHROSCOPY MEDIAL MENISCAL DEBRIDEMENT, CHONDROPLASTY;  Surgeon: Gearlean Alf, MD;  Location: [facility];  Service: Orthopedics;  Laterality: Left;   KNEE ARTHROSCOPY W/ MENISCECTOMY Right 01-29-2011   twice, first trimmed mediscus and cyst   LEFT HEART CATHETERIZATION WITH CORONARY ANGIOGRAM N/A 05/17/2014    Procedure: LEFT HEART CATHETERIZATION WITH CORONARY ANGIOGRAM;  Surgeon: Burnell Blanks, MD;  Location[facility] C[facility] LAB;  Service: Cardiovascular;  Laterality: N/A;   LIPOMA EXCISION  1975   multiple, 10-12   PERCUTANEOUS CORONARY STENT INTERVENTION (PCI-S)  05/17/2014   Procedure: PERCUTANEOUS CORONARY STENT INTERVENTION (PCI-S);  Surgeon: Burnell Blanks, MD;  [facility] LAB;  Service: Cardiovascular;;  Diag1 and Prox to Distal LAD   Family History  Problem Relation Age of Onset   Hypertension Mother    Cancer Mother 47       ovarian   Heart disease Father        CHF   Heart attack Father 28   Cancer Sister        breast- just finished her last radiation   Heart disease Maternal Grandfather        ?   Heart attack  Paternal Grandfather 28   Heart attack Paternal Uncle 17       Sudden death   Diabetes Neg Hx    Social History   Socioeconomic History   Marital status: Married    Spouse name: Not on file   Number of children: 2   Years of education: Not on file   Highest education level: Not on file  Occupational History   Not on file  Tobacco Use   Smoking status: Former    Packs/day: 1.00    Years: 4.00    Total pack years: 4.00    Types: Cigarettes    Quit date: 06/14/1967    Years since quitting: 54.6   Smokeless tobacco: Never  Substance and Sexual Activity   Alcohol use: No    Alcohol/week: 0.0 standard drinks of alcohol   Drug use: No   Sexual activity: Yes    Comment: lives with wife  Other Topics Concern   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     Social Determinants of Health   Financial Resource Strain: Low Risk  (02/10/2021)   Overall Financial Resource Strain (CARDIA)    Difficulty of Paying Living Expenses: Not hard at all  Food Insecurity: No Food Insecurity (02/10/2022)   Hunger Vital Sign    Worried About Running Out of Food in the Last Year: Never true    Ran Out of Food in  the Last Year: Never true  Transportation Needs: No Transportation Needs (02/10/2022)   PRAPARE - Hydrologist (Medical): No    Lack of Transportation (Non-Medical): No  Physical Activity: Inactive (02/10/2021)   Exercise Vital Sign    Days of Exercise per Week: 0 days    Minutes of Exercise per Session: 0 min  Stress: No Stress Concern Present (02/10/2021)   Protection    Feeling of Stress : Not at all  Social Connections: [place] (02/10/2021)   Social Connection and Isolation Panel [NHANES]    Frequency of Communication with Friends and Family: Not on file    Frequency of Social Gatherings with Friends and Family: More than three times a week    Attends Religious Services: More than 4 times per year    Active Member of [employer] or Organizations: Yes    Attends Music therapist: More than 4 times per year    Marital Status: Married    Tobacco Counseling Counseling given: Not Answered   Clinical Intake:  Pre-visit preparation completed: Yes  Pain : No/denies pain   How often do you need to have someone help you when you read instructions, pamphlets, or other written materials from your doctor or pharmacy?: 1 - Never  Diabetic? Nutrition Risk Assessment:  Has the patient had any N/V/D within the last 2 months?  No  Does the patient have any non-healing wounds?  No  Has the patient had any unintentional weight loss or weight gain?  No   Diabetes:  Is the patient diabetic?  Yes  If diabetic, was a CBG obtained today?  No  Did the patient bring in their glucometer from home?  No  How often do you monitor your CBG's? Wears CGM.   Financial Strains and Diabetes Management:  Are you having any financial strains with the device, your supplies or your medication? No .  Does the patient want to be seen by  Chronic Care Management for management of their diabetes?   No  Would the patient like to be referred to a Nutritionist or for Diabetic Management?  No   Diabetic Exams:  Diabetic Eye Exam: Completed 11/24/21 Diabetic Foot Exam: Completed 03/30/21    Interpreter Needed?: No  Information entered by :: Beatris Ship, [place]   Activities of Daily Living    02/10/2022    1:51 PM  In your present state of health, do you have any difficulty performing the following activities:  Hearing? 1  Vision? 1  Comment just had one cataract removed  Difficulty concentrating or making decisions? 0  Walking or climbing stairs? 1  Dressing or bathing? 0  Doing errands, shopping? 0  Preparing Food and eating ? N  Using the Toilet? N  In the past six months, have you accidently leaked urine? N  Do you have problems with loss of bowel control? N  Managing your Medications? N  Managing your Finances? N  Housekeeping or managing your Housekeeping? N    Patient Care Team: Mosie Lukes, MD as PCP - General (Family Medicine) Minus Breeding, MD as PCP - Cardiology (Cardiology) Minus Breeding, MD as Consulting Physician (Cardiology) Philemon Kingdom, MD as Consulting Physician (Endocrinology) Gaynelle Arabian, MD as Consulting Physician (Orthopedic Surgery) Troy Sine, MD as Consulting Physician (Cardiology) Luberta Mutter, MD as Consulting Physician (Ophthalmology) Clarene Essex, MD as Consulting Physician (Gastroenterology) [place], Jennefer Bravo, MD as Consulting Physician (Dermatology) Trula Slade, DPM as Consulting Physician (Podiatry) Roseanne Kaufman, MD as Consulting Physician (Orthopedic Surgery)  Indicate any recent Medical Services you may have received from other than [facility] providers in the past year (date may be approximate).     Assessment:   This is a routine wellness examination for Yavuz.  Hearing/Vision screen No results found.  Dietary issues and exercise activities discussed: Current Exercise Habits: Home exercise  routine, Type of exercise: Other - see comments (recumbent trainer), Time (Minutes): 30, Frequency (Times/Week): 5, Weekly Exercise (Minutes/Week): 150, Intensity: Moderate, Exercise limited by: cardiac condition(s)   Goals Addressed   None    Depression Screen    02/10/2022    1:50 PM 02/01/2022    9:17 AM 10/01/2021   11:20 AM 02/10/2021    3:32 PM 10/02/2020   11:24 AM 10/04/2019    1:16 PM 09/20/2017   10:14 AM  PHQ 2/9 Scores  PHQ - 2 Score 0 0 0 0 0 0 0    Fall Risk    02/10/2022    1:48 PM 02/01/2022    9:16 AM 10/01/2021   11:20 AM 02/10/2021    3:32 PM 10/02/2020   11:25 AM  Fall Risk   Falls in the past year? 0 0 0 0 0  Number falls in past yr: 0 0 0 0   Injury with Fall? 0 0 0 0   Risk for fall due to : No Fall Risks  No Fall Risks    Follow up Falls evaluation completed Falls evaluation completed Falls evaluation completed Falls prevention discussed     FALL RISK PREVENTION PERTAINING TO THE HOME:  Any stairs in or around the home? Yes  If so, are there any without handrails? No  Home free of loose throw rugs in walkways, pet beds, electrical cords, etc? Yes  Adequate lighting in your home to reduce risk of falls? Yes   ASSISTIVE DEVICES UTILIZED TO PREVENT FALLS:  Life alert? No  Use of a cane, walker or w/c?  No  Grab bars in the bathroom? Yes  Shower chair or bench in shower? Yes  Elevated toilet seat or a handicapped toilet? Yes   TIMED UP AND GO:  Was the test performed?  No, audio visit .    Cognitive Function:    09/16/2016   10:04 AM  MMSE - Mini Mental State Exam  Orientation to time 5  Orientation to Place 5  Registration 3  Attention/ Calculation 5  Recall 3  Language- name 2 objects 2  Language- repeat 1  Language- follow 3 step command 3  Language- read & follow direction 1  Write a sentence 1  Copy design 1  Total score 30        02/10/2022    1:56 PM  6CIT Screen  What Year? 0 points  What month? 0 points  What time? 0  points  Count back from 20 0 points  Months in reverse 0 points  Repeat phrase 0 points  Total Score 0 points    Immunizations Immunization History  Administered Date(s) Administered   COVID-19, mRNA, vaccine(Comirnaty)12 years and older 01/25/2022   Fluad Quad(high Dose 65+) 12/14/2019, 12/29/2020, 12/18/2021   Hepatitis A 10/18/2003   Hepatitis B 10/18/2003, 11/29/2003   Influenza Split 12/14/2019   Influenza, High Dose Seasonal PF 12/19/2015, 12/28/2016, 12/12/2018   Influenza,inj,Quad PF,6+ Mos 01/10/2014, 12/26/2014   Influenza,inj,quad, With Preservative 12/28/2016   Influenza-Unspecified 11/19/2010, 12/03/2011, 12/12/2012   Mumps 01/29/2004   PFIZER Comirnaty(Gray Top)Covid-19 Tri-Sucrose Vaccine 07/11/2020   PFIZER(Purple Top)SARS-COV-2 Vaccination 04/19/2019, 05/14/2019, 01/12/2020, 01/12/2022   Pfizer Covid-19 Vaccine Bivalent Booster 10yr & up 01/14/2021, 12/18/2021   Pneumococcal Conjugate-13 01/10/2014, 03/16/2015, 06/24/2021   Pneumococcal Polysaccharide-23 10/18/2003, 03/16/2015, 03/31/2015   Td 05/13/1997, 05/21/2009   Tdap 12/18/2015   Zoster Recombinat (Shingrix) 05/17/2017, 09/07/2017   Zoster, Live 06/06/2009    TDAP status: Up to date  Flu Vaccine status: Up to date  Pneumococcal vaccine status: Up to date  Covid-19 vaccine status: Information provided on how to obtain vaccines.   Qualifies for Shingles Vaccine? Yes   Zostavax completed Yes   Shingrix Completed?: Yes  Screening Tests Health Maintenance  Topic Date Due   Diabetic kidney evaluation - Urine ACR  10/09/2016   Medicare Annual Wellness (AWV)  02/10/2022   COVID-19 Vaccine (7 - 2023-24 season) 03/09/2022   HEMOGLOBIN A1C  03/27/2022   FOOT EXAM  03/30/2022   OPHTHALMOLOGY EXAM  11/25/2022   Diabetic kidney evaluation - GFR measurement  01/26/2023   Pneumonia Vaccine 76 Years old  Completed   INFLUENZA VACCINE  Completed   Hepatitis C Screening  Completed   Zoster Vaccines-  Shingrix  Completed   HPV VACCINES  Aged Out   COLONOSCOPY (Pts 45-461yrInsurance coverage will need to be confirmed)  Discontinued    Health Maintenance  Health Maintenance Due  Topic Date Due   Diabetic kidney evaluation - Urine ACR  10/09/2016   Medicare Annual Wellness (AWV)  02/10/2022    Colorectal cancer screening: Type of screening: Colonoscopy. Completed 07/14/11. Repeat every N/a years  Lung Cancer Screening: (Low Dose CT Chest recommended if Age 77-80ears, 30 pack-year currently smoking OR have quit w/in 15years.) does not qualify.   Additional Screening:  Hepatitis C Screening: does qualify; Completed 03/26/15  Vision Screening: Recommended annual ophthalmology exams for early detection of glaucoma and other disorders of the eye. Is the patient up to date with their annual eye exam?  Yes  Who is the provider or  what is the name of the office in which the patient attends annual eye exams? [facility] If pt is not established with a provider, would they like to be referred to a provider to establish care? No .   Dental Screening: Recommended annual dental exams for proper oral hygiene  Community Resource Referral / Chronic Care Management: CRR required this visit?  No   CCM required this visit?  No      Plan:     I have personally reviewed and noted the following in the patient's chart:   Medical and social history Use of alcohol, tobacco or illicit drugs  Current medications and supplements including opioid prescriptions. Patient is not currently taking opioid prescriptions. Functional ability and status Nutritional status Physical activity Advanced directives List of other physicians Hospitalizations, surgeries, and ER visits in previous 12 months Vitals Screenings to include cognitive, depression, and falls Referrals and appointments  In addition, I have reviewed and discussed with patient certain preventive protocols, quality metrics,  and best practice recommendations. A written personalized care plan for preventive services as well as general preventive health recommendations were provided to patient.  Due to this being a telephonic visit, the after visit summary with patients personalized plan was offered to patient via mail or my-chart. Patient would like to access on my-chart.     Beatris Ship, [place]   02/10/2022   Nurse Notes: None

## 2022-02-10 NOTE — Patient Instructions (Signed)
Michael Cunningham , Thank you for taking time to come for your Medicare Wellness Visit. I appreciate your ongoing commitment to your health goals. Please review the following plan we discussed and let me know if I can assist you in the future.   These are the goals we discussed:  Goals      DIET - INCREASE WATER INTAKE     Drink at least 3 glasses of water per day.     Increase physical activity     Start using your exercise machine for at least 25 minutes 2-3 days weekly.        This is a list of the screening recommended for you and due dates:  Health Maintenance  Topic Date Due   Yearly kidney health urinalysis for diabetes  10/09/2016   COVID-19 Vaccine (7 - 2023-24 season) 03/09/2022   Hemoglobin A1C  03/27/2022   Complete foot exam   03/30/2022   Eye exam for diabetics  11/25/2022   Yearly kidney function blood test for diabetes  01/26/2023   Medicare Annual Wellness Visit  02/11/2023   Pneumonia Vaccine  Completed   Flu Shot  Completed   Hepatitis C Screening: USPSTF Recommendation to screen - Ages 18-79 yo.  Completed   Zoster (Shingles) Vaccine  Completed   HPV Vaccine  Aged Out   Colon Cancer Screening  Discontinued     Next appointment: Follow up in one year for your annual wellness visit.   Preventive Care 65 Years and Older, Male Preventive care refers to lifestyle choices and visits with your health care provider that can promote health and wellness. What does preventive care include? A yearly physical exam. This is also called an annual well check. Dental exams once or twice a year. Routine eye exams. Ask your health care provider how often you should have your eyes checked. Personal lifestyle choices, including: Daily care of your teeth and gums. Regular physical activity. Eating a healthy diet. Avoiding tobacco and drug use. Limiting alcohol use. Practicing safe sex. Taking low doses of aspirin every day. Taking vitamin and mineral supplements as  recommended by your health care provider. What happens during an annual well check? The services and screenings done by your health care provider during your annual well check will depend on your age, overall health, lifestyle risk factors, and family history of disease. Counseling  Your health care provider may ask you questions about your: Alcohol use. Tobacco use. Drug use. Emotional well-being. Home and relationship well-being. Sexual activity. Eating habits. History of falls. Memory and ability to understand (cognition). Work and work Statistician. Screening  You may have the following tests or measurements: Height, weight, and BMI. Blood pressure. Lipid and cholesterol levels. These may be checked every 5 years, or more frequently if you are over 18 years old. Skin check. Lung cancer screening. You may have this screening every year starting at age 35 if you have a 30-pack-year history of smoking and currently smoke or have quit within the past 15 years. Fecal occult blood test (FOBT) of the stool. You may have this test every year starting at age 62. Flexible sigmoidoscopy or colonoscopy. You may have a sigmoidoscopy every 5 years or a colonoscopy every 10 years starting at age 67. Prostate cancer screening. Recommendations will vary depending on your family history and other risks. Hepatitis C blood test. Hepatitis B blood test. Sexually transmitted disease (STD) testing. Diabetes screening. This is done by checking your blood sugar (glucose) after you have  not eaten for a while (fasting). You may have this done every 1-3 years. Abdominal aortic aneurysm (AAA) screening. You may need this if you are a current or former smoker. Osteoporosis. You may be screened starting at age 65 if you are at high risk. Talk with your health care provider about your test results, treatment options, and if necessary, the need for more tests. Vaccines  Your health care provider may recommend  certain vaccines, such as: Influenza vaccine. This is recommended every year. Tetanus, diphtheria, and acellular pertussis (Tdap, Td) vaccine. You may need a Td booster every 10 years. Zoster vaccine. You may need this after age 73. Pneumococcal 13-valent conjugate (PCV13) vaccine. One dose is recommended after age 57. Pneumococcal polysaccharide (PPSV23) vaccine. One dose is recommended after age 59. Talk to your health care provider about which screenings and vaccines you need and how often you need them. This information is not intended to replace advice given to you by your health care provider. Make sure you discuss any questions you have with your health care provider. Document Released: 03/28/2015 Document Revised: 11/19/2015 Document Reviewed: 12/31/2014 Elsevier Interactive Patient Education  2017 [employer] Prevention in the Home Falls can cause injuries. They can happen to people of all ages. There are many things you can do to make your home safe and to help prevent falls. What can I do on the outside of my home? Regularly fix the edges of walkways and driveways and fix any cracks. Remove anything that might make you trip as you walk through a door, such as a raised step or threshold. Trim any bushes or trees on the path to your home. Use bright outdoor lighting. Clear any walking paths of anything that might make someone trip, such as rocks or tools. Regularly check to see if handrails are loose or broken. Make sure that both sides of any steps have handrails. Any raised decks and porches should have guardrails on the edges. Have any leaves, snow, or ice cleared regularly. Use sand or salt on walking paths during winter. Clean up any spills in your garage right away. This includes oil or grease spills. What can I do in the bathroom? Use night lights. Install grab bars by the toilet and in the tub and shower. Do not use towel bars as grab bars. Use non-skid mats or  decals in the tub or shower. If you need to sit down in the shower, use a plastic, non-slip stool. Keep the floor dry. Clean up any water that spills on the floor as soon as it happens. Remove soap buildup in the tub or shower regularly. Attach bath mats securely with double-sided non-slip rug tape. Do not have throw rugs and other things on the floor that can make you trip. What can I do in the bedroom? Use night lights. Make sure that you have a light by your bed that is easy to reach. Do not use any sheets or blankets that are too big for your bed. They should not hang down onto the floor. Have a firm chair that has side arms. You can use this for support while you get dressed. Do not have throw rugs and other things on the floor that can make you trip. What can I do in the kitchen? Clean up any spills right away. Avoid walking on wet floors. Keep items that you use a lot in easy-to-reach places. If you need to reach something above you, use a strong step stool  that has a grab bar. Keep electrical cords out of the way. Do not use floor polish or wax that makes floors slippery. If you must use wax, use non-skid floor wax. Do not have throw rugs and other things on the floor that can make you trip. What can I do with my stairs? Do not leave any items on the stairs. Make sure that there are handrails on both sides of the stairs and use them. Fix handrails that are broken or loose. Make sure that handrails are as long as the stairways. Check any carpeting to make sure that it is firmly attached to the stairs. Fix any carpet that is loose or worn. Avoid having throw rugs at the top or bottom of the stairs. If you do have throw rugs, attach them to the floor with carpet tape. Make sure that you have a light switch at the top of the stairs and the bottom of the stairs. If you do not have them, ask someone to add them for you. What else can I do to help prevent falls? Wear shoes that: Do not  have high heels. Have rubber bottoms. Are comfortable and fit you well. Are closed at the toe. Do not wear sandals. If you use a stepladder: Make sure that it is fully opened. Do not climb a closed stepladder. Make sure that both sides of the stepladder are locked into place. Ask someone to hold it for you, if possible. Clearly mark and make sure that you can see: Any grab bars or handrails. First and last steps. Where the edge of each step is. Use tools that help you move around (mobility aids) if they are needed. These include: Canes. Walkers. Scooters. Crutches. Turn on the lights when you go into a dark area. Replace any light bulbs as soon as they burn out. Set up your furniture so you have a clear path. Avoid moving your furniture around. If any of your floors are uneven, fix them. If there are any pets around you, be aware of where they are. Review your medicines with your doctor. Some medicines can make you feel dizzy. This can increase your chance of falling. Ask your doctor what other things that you can do to help prevent falls. This information is not intended to replace advice given to you by your health care provider. Make sure you discuss any questions you have with your health care provider. Document Released: 12/26/2008 Document Revised: 08/07/2015 Document Reviewed: 04/05/2014 Elsevier Interactive Patient Education  2017 [employer].

## 2022-02-17 ENCOUNTER — Encounter: Payer: Self-pay | Admitting: Family Medicine

## 2022-03-02 DIAGNOSIS — G4733 Obstructive sleep apnea (adult) (pediatric): Secondary | ICD-10-CM | POA: Diagnosis not present

## 2022-03-12 DIAGNOSIS — G4733 Obstructive sleep apnea (adult) (pediatric): Secondary | ICD-10-CM | POA: Diagnosis not present

## 2022-03-16 DIAGNOSIS — Z961 Presence of intraocular lens: Secondary | ICD-10-CM | POA: Diagnosis not present

## 2022-03-16 DIAGNOSIS — H35352 Cystoid macular degeneration, left eye: Secondary | ICD-10-CM | POA: Diagnosis not present

## 2022-04-01 DIAGNOSIS — H2511 Age-related nuclear cataract, right eye: Secondary | ICD-10-CM | POA: Diagnosis not present

## 2022-04-01 DIAGNOSIS — H269 Unspecified cataract: Secondary | ICD-10-CM | POA: Diagnosis not present

## 2022-04-02 DIAGNOSIS — G4733 Obstructive sleep apnea (adult) (pediatric): Secondary | ICD-10-CM | POA: Diagnosis not present

## 2022-04-13 ENCOUNTER — Ambulatory Visit: Payer: Medicare HMO | Admitting: Podiatry

## 2022-04-13 DIAGNOSIS — B351 Tinea unguium: Secondary | ICD-10-CM | POA: Diagnosis not present

## 2022-04-13 DIAGNOSIS — E1149 Type 2 diabetes mellitus with other diabetic neurological complication: Secondary | ICD-10-CM

## 2022-04-13 DIAGNOSIS — M79676 Pain in unspecified toe(s): Secondary | ICD-10-CM

## 2022-04-20 NOTE — Progress Notes (Signed)
Subjective: Chief Complaint  Patient presents with   routine foot care      77 y.o. returns the office today for painful, elongated, thickened toenails which he cannot trim himself. Denies any redness or drainage around the nails.  No open lesions he reports no new concerns today.  PCP: Mosie Lukes, MD -last seen February 10, 2022 (with Dr. Nani Ravens) Endocrinologist: Dr. Benjiman Core, MD   Last A1c was 8.0 on September 24, 2021  Objective: AAO 3, NAD DP/PT pulses palpable, CRT less than 3 seconds Sensation is mildly decreased with SWM but overall no change.  Neuropathy symptoms are unchanged. Nails hypertrophic, dystrophic, elongated, brittle, discolored 10. There is tenderness overlying the nails 1-5 bilaterally. There is no surrounding erythema or drainage along the nail sites. No open lesions. No pain with calf compression, swelling, warmth, erythema.  Assessment: Patient presents with symptomatic onychomycosis  Plan: -Treatment options including alternatives, risks, complications were discussed -Nails sharply debrided 10 without complication/bleeding. -Discussed daily foot inspection. If there are any changes, to call the office immediately.  -Follow-up as scheduled or sooner if any problems are to arise. In the meantime, encouraged to call the office with any questions, concerns, changes symptoms.  Return in about 2 months (around 06/14/2022).  Celesta Gentile, DPM

## 2022-04-30 ENCOUNTER — Other Ambulatory Visit (HOSPITAL_BASED_OUTPATIENT_CLINIC_OR_DEPARTMENT_OTHER): Payer: Self-pay

## 2022-04-30 DIAGNOSIS — H35352 Cystoid macular degeneration, left eye: Secondary | ICD-10-CM | POA: Diagnosis not present

## 2022-05-03 DIAGNOSIS — G4733 Obstructive sleep apnea (adult) (pediatric): Secondary | ICD-10-CM | POA: Diagnosis not present

## 2022-05-04 ENCOUNTER — Ambulatory Visit: Payer: Medicare HMO | Admitting: Family Medicine

## 2022-05-14 DIAGNOSIS — H35353 Cystoid macular degeneration, bilateral: Secondary | ICD-10-CM | POA: Diagnosis not present

## 2022-05-26 ENCOUNTER — Other Ambulatory Visit (HOSPITAL_BASED_OUTPATIENT_CLINIC_OR_DEPARTMENT_OTHER): Payer: Self-pay

## 2022-05-26 MED ORDER — BRIMONIDINE TARTRATE 0.2 % OP SOLN
1.0000 [drp] | Freq: Two times a day (BID) | OPHTHALMIC | 2 refills | Status: DC
  Filled 2022-05-26: qty 5, 50d supply, fill #0

## 2022-06-03 LAB — BASIC METABOLIC PANEL
BUN: 16 (ref 4–21)
CO2: 28 — AB (ref 13–22)
Chloride: 106 (ref 99–108)
Glucose: 118
Potassium: 4.5 mEq/L (ref 3.5–5.1)

## 2022-06-03 LAB — COMPREHENSIVE METABOLIC PANEL
Calcium: 9.2 (ref 8.7–10.7)
eGFR: 81

## 2022-06-03 LAB — PROTEIN / CREATININE RATIO, URINE
Albumin, U: 7.08
Creatinine, Urine: 69

## 2022-06-03 LAB — MICROALBUMIN / CREATININE URINE RATIO: Microalb Creat Ratio: 10.3

## 2022-06-04 DIAGNOSIS — H35353 Cystoid macular degeneration, bilateral: Secondary | ICD-10-CM | POA: Diagnosis not present

## 2022-06-04 DIAGNOSIS — Z961 Presence of intraocular lens: Secondary | ICD-10-CM | POA: Diagnosis not present

## 2022-06-14 ENCOUNTER — Other Ambulatory Visit (HOSPITAL_BASED_OUTPATIENT_CLINIC_OR_DEPARTMENT_OTHER): Payer: Self-pay

## 2022-06-16 LAB — LAB REPORT - SCANNED: A1c: 7.9

## 2022-06-22 ENCOUNTER — Ambulatory Visit: Payer: Medicare HMO | Admitting: Podiatry

## 2022-06-22 DIAGNOSIS — M79676 Pain in unspecified toe(s): Secondary | ICD-10-CM

## 2022-06-22 DIAGNOSIS — B351 Tinea unguium: Secondary | ICD-10-CM

## 2022-06-22 DIAGNOSIS — E1149 Type 2 diabetes mellitus with other diabetic neurological complication: Secondary | ICD-10-CM | POA: Diagnosis not present

## 2022-06-23 NOTE — Progress Notes (Signed)
Subjective: Chief Complaint  Patient presents with   Nail Problem    Nail trim    77 y.o. returns the office today for painful, elongated, thickened toenails which he cannot trim himself. Denies any redness or drainage around the nails.  No open lesions he reports no new concerns today.  PCP: Bradd Canary, MD -last seen February 10, 2022 (with Dr. Carmelia Roller) Endocrinologist: Dr. Ernest Haber, MD   Last A1c was 8.0 on September 24, 2021  Objective: AAO 3, NAD DP/PT pulses palpable, CRT less than 3 seconds Sensation is mildly decreased with SWM but overall no change.  Neuropathy symptoms are unchanged. Nails hypertrophic, dystrophic, elongated, brittle, discolored 10. There is tenderness overlying the nails 1-5 bilaterally. There is no surrounding erythema or drainage along the nail sites. No open lesions. No pain with calf compression, swelling, warmth, erythema.  Assessment: Patient presents with symptomatic onychomycosis  Plan: -Treatment options including alternatives, risks, complications were discussed -Nails sharply debrided 10 without complication/bleeding. -Discussed daily foot inspection. If there are any changes, to call the office immediately.  -Follow-up as scheduled or sooner if any problems are to arise. In the meantime, encouraged to call the office with any questions, concerns, changes symptoms.  Return in about 9 weeks (around 08/24/2022).  Ovid Curd, DPM

## 2022-06-25 DIAGNOSIS — H35353 Cystoid macular degeneration, bilateral: Secondary | ICD-10-CM | POA: Diagnosis not present

## 2022-07-26 ENCOUNTER — Other Ambulatory Visit (INDEPENDENT_AMBULATORY_CARE_PROVIDER_SITE_OTHER): Payer: Medicare HMO

## 2022-07-26 ENCOUNTER — Telehealth: Payer: Self-pay

## 2022-07-26 DIAGNOSIS — E782 Mixed hyperlipidemia: Secondary | ICD-10-CM | POA: Diagnosis not present

## 2022-07-26 DIAGNOSIS — E1142 Type 2 diabetes mellitus with diabetic polyneuropathy: Secondary | ICD-10-CM | POA: Diagnosis not present

## 2022-07-26 DIAGNOSIS — I10 Essential (primary) hypertension: Secondary | ICD-10-CM | POA: Diagnosis not present

## 2022-07-26 LAB — CBC
HCT: 47.2 % (ref 39.0–52.0)
Hemoglobin: 16 g/dL (ref 13.0–17.0)
MCHC: 34 g/dL (ref 30.0–36.0)
MCV: 95.7 fl (ref 78.0–100.0)
Platelets: 162 10*3/uL (ref 150.0–400.0)
RBC: 4.93 Mil/uL (ref 4.22–5.81)
RDW: 14.3 % (ref 11.5–15.5)
WBC: 5.5 10*3/uL (ref 4.0–10.5)

## 2022-07-26 LAB — COMPREHENSIVE METABOLIC PANEL
ALT: 18 U/L (ref 0–53)
AST: 26 U/L (ref 0–37)
Albumin: 4.3 g/dL (ref 3.5–5.2)
Alkaline Phosphatase: 85 U/L (ref 39–117)
BUN: 18 mg/dL (ref 6–23)
CO2: 29 mEq/L (ref 19–32)
Calcium: 9.5 mg/dL (ref 8.4–10.5)
Chloride: 102 mEq/L (ref 96–112)
Creatinine, Ser: 0.9 mg/dL (ref 0.40–1.50)
GFR: 82.05 mL/min (ref >60.00)
Glucose, Bld: 143 mg/dL — ABNORMAL HIGH (ref 70–99)
Potassium: 4.5 mEq/L (ref 3.5–5.1)
Sodium: 139 mEq/L (ref 135–145)
Total Bilirubin: 0.8 mg/dL (ref 0.2–1.2)
Total Protein: 7.2 g/dL (ref 6.0–8.3)

## 2022-07-26 LAB — HEMOGLOBIN A1C: Hgb A1c MFr Bld: 7.2 % — ABNORMAL HIGH (ref 4.6–6.5)

## 2022-07-26 LAB — LIPID PANEL
Cholesterol: 130 mg/dL (ref 0–200)
HDL: 38.2 mg/dL — ABNORMAL LOW (ref >39.00)
LDL Cholesterol: 70 mg/dL (ref 0–99)
NonHDL: 91.65
Total CHOL/HDL Ratio: 3
Triglycerides: 108 mg/dL (ref 0.0–149.0)
VLDL: 21.6 mg/dL (ref 0.0–40.0)

## 2022-07-26 NOTE — Addendum Note (Signed)
Addended by: Thelma Barge D on: 07/26/2022 11:27 AM   Modules accepted: Orders

## 2022-07-26 NOTE — Telephone Encounter (Signed)
Patient was in office today for labs for future appointment on 08/02/2022 no orders in epic. Blood drawn need orders please advise and Thanks.

## 2022-08-01 NOTE — Assessment & Plan Note (Addendum)
hgba1c acceptable, minimize simple carbs. Increase exercise as tolerated. Continue current meds. Reminded to increase protein and minimize carbs. [facility] added Jardiance 10 mg and his sugars have greatly improved with no concerning drops or highs.

## 2022-08-01 NOTE — Assessment & Plan Note (Signed)
Supplement and monitor 

## 2022-08-01 NOTE — Assessment & Plan Note (Signed)
Tolerating statin, encouraged heart healthy diet, avoid trans fats, minimize simple carbs and saturated fats. Increase exercise as tolerated 

## 2022-08-01 NOTE — Assessment & Plan Note (Signed)
Well controlled, no changes to meds. Encouraged heart healthy diet such as the DASH diet and exercise as tolerated.  °

## 2022-08-02 ENCOUNTER — Ambulatory Visit (INDEPENDENT_AMBULATORY_CARE_PROVIDER_SITE_OTHER): Payer: Medicare HMO | Admitting: Family Medicine

## 2022-08-02 VITALS — BP 122/64 | HR 49 | Temp 97.5°F | Resp 16 | Ht 70.0 in | Wt 239.2 lb

## 2022-08-02 DIAGNOSIS — I10 Essential (primary) hypertension: Secondary | ICD-10-CM

## 2022-08-02 DIAGNOSIS — E1142 Type 2 diabetes mellitus with diabetic polyneuropathy: Secondary | ICD-10-CM

## 2022-08-02 DIAGNOSIS — E669 Obesity, unspecified: Secondary | ICD-10-CM | POA: Diagnosis not present

## 2022-08-02 DIAGNOSIS — Z7984 Long term (current) use of oral hypoglycemic drugs: Secondary | ICD-10-CM | POA: Diagnosis not present

## 2022-08-02 DIAGNOSIS — E782 Mixed hyperlipidemia: Secondary | ICD-10-CM

## 2022-08-02 DIAGNOSIS — E559 Vitamin D deficiency, unspecified: Secondary | ICD-10-CM

## 2022-08-02 DIAGNOSIS — Z6834 Body mass index (BMI) 34.0-34.9, adult: Secondary | ICD-10-CM | POA: Diagnosis not present

## 2022-08-02 NOTE — Progress Notes (Signed)
Subjective:    Patient ID: Michael Cunningham., male    DOB: [birth year], 77 y.o.   MRN: [id number]  No chief complaint on file.   HPI Patient is in today for follow up on chronic medical concerns. No recent febrile illness or hospitalizations. Denies CP/palp/SOB/HA/congestion/fevers/GI or GU c/o. Taking meds as prescribed. [facility] Jardiance 10 mg and his sugars have been remarkably improved. No concerning low numbers and his sugars have largely stayed in range. They are monitoring labs roughly every 6 months. He was also able to get changed to the CGM [place] 3 and he brings in a printout of a week which looks great. Will scan into record. No acute concerns  Past Medical History:  Diagnosis Date   CAD (coronary artery disease)    a. 05/2014 [place] s/p DES x4 to mid-distLAD and DESx1 to DI   Complication of anesthesia    11'12 Surgery with excrutiating "head about to explode" upon awakening  -lasted several hours postop  ( SURGERY DONE 08-13-2011 AT [facility] PT DID OK POST OP)   Diverticulosis of colon    LEFT SIDE   Exposure to [facility] 10/18/2016   [place]   History of small bowel obstruction    X2  28 YRS AGO AND LAST ONE 04/2014--  RESOLVED WITHOUT SURGICAL INTERVENTION   Hyperlipidemia    Hypertension    Knee pain, bilateral 08/13/2011   Left ureteral calculus    Multiple lipomas    hx. mulitple and some remains -arms, legs   Obesity, unspecified 09/18/2013   Obstructive sleep apnea 01/01/2016   Recurrent kidney stones 07/08/2013   Follows with Dr Patsi Sears He believes they are Calcium oxalate stones   Thrombocytopenia (HCC) 07/07/2014   Type 2 diabetes mellitus (HCC)    Vitamin D deficiency 07/02/2014    Past Surgical History:  Procedure Laterality Date   COLONOSCOPY W/ POLYPECTOMY  LAST ONE 2013   CORONARY STENT PLACEMENT  05/17/2014   PROXIMAL X 4  MID & DISTAL LAD  DIAGONAL    KNEE ARTHROSCOPY  08/13/2011   right ARTHROSCOPY KNEE;  Surgeon:  Loanne Drilling, MD;  Location: [facility]S;  Service: Orthopedics;  Laterality: Right;  medial  meniscal debridement, excision of plica, synovectomy   KNEE ARTHROSCOPY Left 11/16/2013   Procedure: LEFT KNEE ARTHROSCOPY MEDIAL MENISCAL DEBRIDEMENT, CHONDROPLASTY;  Surgeon: Loanne Drilling, MD;  Location: [facility];  Service: Orthopedics;  Laterality: Left;   KNEE ARTHROSCOPY W/ MENISCECTOMY Right 01-29-2011   twice, first trimmed mediscus and cyst   LEFT HEART CATHETERIZATION WITH CORONARY ANGIOGRAM N/A 05/17/2014   Procedure: LEFT HEART CATHETERIZATION WITH CORONARY ANGIOGRAM;  Surgeon: Kathleene Hazel, MD;  Location: [facility] CATH LAB;  Service: Cardiovascular;  Laterality: N/A;   LIPOMA EXCISION  1975   multiple, 10-12   PERCUTANEOUS CORONARY STENT INTERVENTION (PCI-S)  05/17/2014   Procedure: PERCUTANEOUS CORONARY STENT INTERVENTION (PCI-S);  Surgeon: Kathleene Hazel, MD;  Location: [facility] CATH LAB;  Service: Cardiovascular;;  Diag1 and Prox to Distal LAD    Family History  Problem Relation Age of Onset   Hypertension Mother    Cancer Mother 77       ovarian   Heart disease Father        CHF   Heart attack Father 77   Cancer Sister        breast- just finished her last radiation   Heart disease Maternal Grandfather        ?  Heart attack Paternal Grandfather 104   Heart attack Paternal Uncle 25       Sudden death   Diabetes Neg Hx     Social History   Socioeconomic History   Marital status: Married    Spouse name: Not on file   Number of children: 2   Years of education: Not on file   Highest education level: Not on file  Occupational History   Not on file  Tobacco Use   Smoking status: Former    Packs/day: 1.00    Years: 4.00    Additional pack years: 0.00    Total pack years: 4.00    Types: Cigarettes    Quit date: 06/14/1967    Years since quitting: 55.1   Smokeless tobacco: Never  Substance and Sexual Activity   Alcohol use: No    Alcohol/week: 0.0 standard drinks of  alcohol   Drug use: No   Sexual activity: Yes    Comment: lives with wife  Other Topics Concern   Not on file  Social History Narrative   Retired International aid/development worker improvement.  Lives with wife.     Social Determinants of Health   Financial Resource Strain: Low Risk  (02/10/2021)   Overall Financial Resource Strain (CARDIA)    Difficulty of Paying Living Expenses: Not hard at all  Food Insecurity: No Food Insecurity (02/10/2022)   Hunger Vital Sign    Worried About Running Out of Food in the Last Year: Never true    Ran Out of Food in the Last Year: Never true  Transportation Needs: No Transportation Needs (02/10/2022)   PRAPARE - Administrator, Civil Service (Medical): No    Lack of Transportation (Non-Medical): No  Physical Activity: Inactive (02/10/2021)   Exercise Vital Sign    Days of Exercise per Week: 0 days    Minutes of Exercise per Session: 0 min  Stress: No Stress Concern Present (02/10/2021)   [employer] of Occupational Health - Occupational Stress Questionnaire    Feeling of Stress : Not at all  Social Connections: Socially Integrated (02/10/2021)   Social Connection and Isolation Panel [NHANES]    Frequency of Communication with Friends and Family: Not on file    Frequency of Social Gatherings with Friends and Family: More than three times a week    Attends Religious Services: More than 4 times per year    Active Member of [employer] or Organizations: Yes    Attends Engineer, structural: More than 4 times per year    Marital Status: Married  Catering manager Violence: Not At Risk (02/10/2022)   Humiliation, Afraid, Rape, and Kick questionnaire    Fear of Current or Ex-Partner: No    Emotionally Abused: No    Physically Abused: No    Sexually Abused: No    Outpatient Medications Prior to Visit  Medication Sig Dispense Refill   Accu-Chek FastClix Lancets MISC Use to test blood sugar 2 times daily. Dx: E11.40 204 each 5   acetaminophen  (TYLENOL) 500 MG tablet Take 1,000 mg by mouth every 6 (six) hours as needed for mild pain. Reported on 03/31/2015     aspirin EC 81 MG tablet Take 81 mg by mouth daily with breakfast.      brimonidine (ALPHAGAN) 0.2 % ophthalmic solution Place 1 drop into both eyes 2 (two) times daily. 5 mL 2   cetirizine (ZYRTEC) 10 MG tablet Take 1 tablet (10 mg total) by mouth 2 (two) times daily  as needed for allergies. 100 tablet 3   Cholecalciferol 50 MCG (2000 UT) TABS Take 2 tablets by mouth daily.     empagliflozin (JARDIANCE) 10 MG TABS tablet Take 10 mg by mouth daily.     glucagon (GLUCAGON EMERGENCY) 1 MG injection Inject 1 mg into the muscle once as needed for up to 1 dose. 1 each 12   glucose-Vitamin C 4-0.006 GM CHEW chewable tablet CHEW FOUR TABLETS BY MOUTH  AS NEEDED (REPEAT EVERY 15 MINUTES IF BLOOD SUGAR LESS THAN 70)     insulin aspart (NOVOLOG) 100 UNIT/ML injection Inject 15 Units into the skin 3 (three) times daily before meals. Inject in skin before meals     insulin glargine (LANTUS) 100 UNIT/ML injection Inject 40-50 Units into the skin daily.     ipratropium (ATROVENT) 0.03 % nasal spray Place 2 sprays into the nose 3 (three) times daily.     losartan (COZAAR) 100 MG tablet Take 1 tablet (100 mg total) by mouth daily. (Patient taking differently: Take 75 mg by mouth daily.) 90 tablet 0   metoprolol tartrate (LOPRESSOR) 25 MG tablet Take 1 tablet (25 mg total) by mouth 2 (two) times daily. 180 tablet 3   Multiple Vitamin (MULTIVITAMIN WITH MINERALS) TABS tablet Take 1 tablet by mouth daily.     nitroGLYCERIN (NITROSTAT) 0.4 MG SL tablet Place 1 tablet (0.4 mg total) under the tongue every 5 (five) minutes as needed for chest pain. 25 tablet 3   prasugrel (EFFIENT) 10 MG TABS tablet Take 1 tablet (10 mg total) by mouth daily. 90 tablet 3   prednisoLONE acetate (PRED FORTE) 1 % ophthalmic suspension Place 1 drop into the left eye 4 (four) times daily until November 9. Then start placing 1  drop into the left eye 3 (three) times daily until changed. 10 mL 3   simvastatin (ZOCOR) 40 MG tablet TAKE 1 TABLET (40 MG) BY MOUTH AT BEDTIME. 90 tablet 2   No facility-administered medications prior to visit.    Allergies  Allergen Reactions   Ciprofloxacin Other (See Comments)    Abdominal pain   Lipitor [Atorvastatin Calcium] Other (See Comments)    Severe muscle pain   Lisinopril Cough   Metformin And Related Diarrhea    Review of Systems  Constitutional:  Negative for fever and malaise/fatigue.  HENT:  Positive for congestion.   Eyes:  Negative for blurred vision.  Respiratory:  Positive for cough. Negative for shortness of breath.   Cardiovascular:  Negative for chest pain, palpitations and leg swelling.  Gastrointestinal:  Negative for abdominal pain, blood in stool and nausea.  Genitourinary:  Negative for dysuria and frequency.  Musculoskeletal:  Negative for falls.  Skin:  Negative for rash.  Neurological:  Negative for dizziness, loss of consciousness and headaches.  Endo/Heme/Allergies:  Negative for environmental allergies.  Psychiatric/Behavioral:  Negative for depression. The patient is not nervous/anxious.        Objective:    Physical Exam Constitutional:      General: He is not in acute distress.    Appearance: Normal appearance. He is not ill-appearing or toxic-appearing.  HENT:     Head: Normocephalic and atraumatic.     Right Ear: External ear normal.     Left Ear: External ear normal.     Nose: Nose normal.     Mouth/Throat:     Mouth: Mucous membranes are moist.  Eyes:     General:        Right  eye: No discharge.        Left eye: No discharge.     Conjunctiva/sclera: Conjunctivae normal.  Cardiovascular:     Rate and Rhythm: Normal rate.     Heart sounds: No murmur heard. Pulmonary:     Effort: Pulmonary effort is normal.     Breath sounds: No wheezing.  Abdominal:     General: Bowel sounds are normal.     Tenderness: There is no  guarding.  Skin:    Findings: No rash.  Neurological:     Mental Status: He is alert and oriented to person, place, and time.  Psychiatric:        Behavior: Behavior normal.     BP 122/64 (BP Location: Right Arm, Patient Position: Sitting, Cuff Size: Normal)   Pulse (!) 49   Temp (!) 97.5 F (36.4 C) (Oral)   Resp 16   Ht 5\' 10"  (1.778 m)   Wt 239 lb 3.2 oz (108.5 kg)   SpO2 94%   BMI 34.32 kg/m  Wt Readings from Last 3 Encounters:  08/02/22 239 lb 3.2 oz (108.5 kg)  02/01/22 244 lb 3.2 oz (110.8 kg)  01/27/22 243 lb 4.8 oz (110.4 kg)    Diabetic Foot Exam - Simple   No data filed    Lab Results  Component Value Date   WBC 5.5 07/26/2022   HGB 16.0 07/26/2022   HCT 47.2 07/26/2022   PLT 162.0 07/26/2022   GLUCOSE 143 (H) 07/26/2022   CHOL 130 07/26/2022   TRIG 108.0 07/26/2022   HDL 38.20 (L) 07/26/2022   LDLDIRECT 108.0 04/16/2014   LDLCALC 70 07/26/2022   ALT 18 07/26/2022   AST 26 07/26/2022   NA 139 07/26/2022   K 4.5 07/26/2022   CL 102 07/26/2022   CREATININE 0.90 07/26/2022   BUN 18 07/26/2022   CO2 29 07/26/2022   TSH 2.38 01/25/2022   PSA 1.92 09/25/2018   INR 1.02 05/17/2014   HGBA1C 7.2 (H) 07/26/2022   MICROALBUR 1.7 06/24/2015    Lab Results  Component Value Date   TSH 2.38 01/25/2022   Lab Results  Component Value Date   WBC 5.5 07/26/2022   HGB 16.0 07/26/2022   HCT 47.2 07/26/2022   MCV 95.7 07/26/2022   PLT 162.0 07/26/2022   Lab Results  Component Value Date   NA 139 07/26/2022   K 4.5 07/26/2022   CO2 29 07/26/2022   GLUCOSE 143 (H) 07/26/2022   BUN 18 07/26/2022   CREATININE 0.90 07/26/2022   BILITOT 0.8 07/26/2022   ALKPHOS 85 07/26/2022   AST 26 07/26/2022   ALT 18 07/26/2022   PROT 7.2 07/26/2022   ALBUMIN 4.3 07/26/2022   CALCIUM 9.5 07/26/2022   ANIONGAP 8 05/18/2014   EGFR 81 06/03/2022   GFR 82.05 07/26/2022   Lab Results  Component Value Date   CHOL 130 07/26/2022   Lab Results  Component Value  Date   HDL 38.20 (L) 07/26/2022   Lab Results  Component Value Date   LDLCALC 70 07/26/2022   Lab Results  Component Value Date   TRIG 108.0 07/26/2022   Lab Results  Component Value Date   CHOLHDL 3 07/26/2022   Lab Results  Component Value Date   HGBA1C 7.2 (H) 07/26/2022       Assessment & Plan:  Primary hypertension Assessment & Plan: Well controlled, no changes to meds. Encouraged heart healthy diet such as the DASH diet and exercise as tolerated.  Orders: -     CBC with Differential/Platelet; Future -     Comprehensive metabolic panel; Future -     TSH; Future  Mixed hyperlipidemia Assessment & Plan: Tolerating statin, encouraged heart healthy diet, avoid trans fats, minimize simple carbs and saturated fats. Increase exercise as tolerated   Orders: -     Lipid panel; Future  Vitamin D deficiency Assessment & Plan: Supplement and monitor   Orders: -     VITAMIN D 25 Hydroxy (Vit-D Deficiency, Fractures); Future  Well controlled type 2 diabetes mellitus with peripheral neuropathy (HCC) Assessment & Plan: hgba1c acceptable, minimize simple carbs. Increase exercise as tolerated. Continue current meds. Reminded to increase protein and minimize carbs. [facility] Jardiance 10 [facility] and his sugars have greatly improved with no concerning drops or highs.   Orders: -     Hemoglobin A1c; Future  Obesity (BMI 30-39.9) Assessment & Plan: Maintain heart healthy low carb diet, decrease po intake and increase exercise as tolerated. Needs 7-8 hours of sleep nightly. Avoid trans fats, eat small, frequent meals every 4-5 hours with lean proteins, complex carbs and healthy fats. Minimize simple carbs, high fat foods and processed foods      Danise Edge, MD

## 2022-08-02 NOTE — Patient Instructions (Addendum)
Hydrate throughout day and add plain mucinex twice daily and call ENT if no improvementPostnasal Drip Postnasal drip is the feeling of mucus going down the back of your throat. Mucus is a slimy substance that moistens and cleans your nose and throat, as well as the air pockets in face bones near your forehead and cheeks (sinuses). Small amounts of mucus pass from your nose and sinuses down the back of your throat all the time. This is normal. When you produce too much mucus or the mucus gets too thick, you can feel it. Some common causes of postnasal drip include: Having more mucus because of: A cold or the flu. Allergies. Cold air. Certain medicines. Gastroesophageal reflux. Having more mucus that is thicker because of: A sinus or nasal infection. Dry air. A food allergy. Follow these instructions at home: Relieving discomfort  Gargle with a mixture of salt and water 3-4 times a day or as needed. To make salt water, completely dissolve -1 tsp (3-6 g) of salt in 1 cup (237 mL) of warm water. If the air in your home is dry, use a humidifier to add moisture to the air. Use a saline spray or a container (neti pot) to flush out the nose (nasal irrigation). These methods can help clear away mucus and keep the nasal passages moist. General instructions Take over-the-counter and prescription medicines only as told by your health care provider. Follow instructions from your health care provider about eating or drinking restrictions. You may need to avoid caffeine. Avoid things that you know you are allergic to (allergens), like dust, mold, pollen, pets, or certain foods. Drink enough fluid to keep your urine pale yellow. Keep all follow-up visits. This is important. Contact a health care provider if: You have a fever. You have a sore throat or difficulty swallowing. You have a headache. You have sinus or ear pain. You have a cough that does not go away. The mucus from your nose becomes thick  and is green or yellow in color. You have cold or flu symptoms that last more than 10 days. Summary Postnasal drip is the feeling of mucus going down the back of your throat. Use nasal irrigation or a nasal spray to help clear away mucus and keep the nasal passages moist. Avoid things that you know you are allergic to (allergens), like dust, mold, pollen, pets, or certain foods. This information is not intended to replace advice given to you by your health care provider. Make sure you discuss any questions you have with your health care provider. Document Revised: 01/29/2021 Document Reviewed: 01/29/2021 Elsevier Patient Education  2023 [employer].

## 2022-08-03 ENCOUNTER — Encounter: Payer: Self-pay | Admitting: Family Medicine

## 2022-08-03 NOTE — Assessment & Plan Note (Signed)
Maintain heart healthy low carb diet, decrease po intake and increase exercise as tolerated. Needs 7-8 hours of sleep nightly. Avoid trans fats, eat small, frequent meals every 4-5 hours with lean proteins, complex carbs and healthy fats. Minimize simple carbs, high fat foods and processed foods

## 2022-08-17 ENCOUNTER — Encounter: Payer: Self-pay | Admitting: Podiatry

## 2022-08-24 ENCOUNTER — Ambulatory Visit: Payer: Medicare HMO | Admitting: Podiatry

## 2022-09-10 ENCOUNTER — Ambulatory Visit: Payer: Medicare HMO | Admitting: Podiatry

## 2022-09-10 ENCOUNTER — Other Ambulatory Visit (HOSPITAL_BASED_OUTPATIENT_CLINIC_OR_DEPARTMENT_OTHER): Payer: Self-pay

## 2022-09-10 DIAGNOSIS — M79675 Pain in left toe(s): Secondary | ICD-10-CM

## 2022-09-10 DIAGNOSIS — E1149 Type 2 diabetes mellitus with other diabetic neurological complication: Secondary | ICD-10-CM | POA: Diagnosis not present

## 2022-09-10 DIAGNOSIS — M79674 Pain in right toe(s): Secondary | ICD-10-CM

## 2022-09-10 DIAGNOSIS — B351 Tinea unguium: Secondary | ICD-10-CM | POA: Diagnosis not present

## 2022-09-10 DIAGNOSIS — M79676 Pain in unspecified toe(s): Secondary | ICD-10-CM

## 2022-09-10 MED ORDER — CLOTRIMAZOLE-BETAMETHASONE 1-0.05 % EX CREA
1.0000 | TOPICAL_CREAM | Freq: Every day | CUTANEOUS | 0 refills | Status: DC
  Filled 2022-09-10: qty 30, 30d supply, fill #0

## 2022-09-10 NOTE — Progress Notes (Signed)
Subjective: Chief Complaint  Patient presents with   Nail Problem    Nail trim     77 y.o. returns the office today for painful, elongated, thickened toenails which he cannot trim himself. Denies any redness or drainage around the nails.  The toes are starting to get dry and turned red again.  No drainage or opening or any fevers or chills.  No open lesions he reports no new concerns today.  PCP: Bradd Canary, MD -last seen May 2021 for Endocrinologist: Dr. Ernest Haber, MD   Last A1c was 7.2 on Jul 26, 2022  Objective: AAO 3, NAD DP/PT pulses palpable, CRT less than 3 seconds Sensation is mildly decreased with SWM but overall no change.  Neuropathy symptoms are unchanged. Nails hypertrophic, dystrophic, elongated, brittle, discolored 10. There is tenderness overlying the nails 1-5 bilaterally. There is no surrounding erythema or drainage along the nail sites. On the dorsal aspects of all the toes there is dried, scaly, erythematous rash noted but there is no skin breakdown, drainage or pus.  No increased temperature. No open lesions. No pain with calf compression, swelling, warmth, erythema.  Assessment: Patient presents with symptomatic onychomycosis; skin rash  Plan: -Treatment options including alternatives, risks, complications were discussed -Nails sharply debrided 10 without complication/bleeding. -Prescribed Lotrisone cream as he is on Eliquis previously. -Discussed daily foot inspection. If there are any changes, to call the office immediately.  -Follow-up as scheduled or sooner if any problems are to arise. In the meantime, encouraged to call the office with any questions, concerns, changes symptoms.  Return in about 2 months (around 11/11/2022).  Ovid Curd, DPM

## 2022-09-16 ENCOUNTER — Encounter: Payer: Self-pay | Admitting: Podiatry

## 2022-10-01 ENCOUNTER — Other Ambulatory Visit: Payer: Self-pay

## 2022-10-01 ENCOUNTER — Other Ambulatory Visit (HOSPITAL_BASED_OUTPATIENT_CLINIC_OR_DEPARTMENT_OTHER): Payer: Self-pay

## 2022-10-01 ENCOUNTER — Other Ambulatory Visit: Payer: Self-pay | Admitting: Podiatry

## 2022-10-01 MED ORDER — CLOTRIMAZOLE-BETAMETHASONE 1-0.05 % EX CREA
1.0000 | TOPICAL_CREAM | Freq: Every day | CUTANEOUS | 2 refills | Status: DC
  Filled 2022-10-01 (×2): qty 30, 30d supply, fill #0

## 2022-10-05 ENCOUNTER — Other Ambulatory Visit (HOSPITAL_COMMUNITY): Payer: Self-pay

## 2022-10-13 ENCOUNTER — Encounter (INDEPENDENT_AMBULATORY_CARE_PROVIDER_SITE_OTHER): Payer: Self-pay

## 2022-10-28 ENCOUNTER — Other Ambulatory Visit (INDEPENDENT_AMBULATORY_CARE_PROVIDER_SITE_OTHER): Payer: Medicare HMO

## 2022-10-28 DIAGNOSIS — E559 Vitamin D deficiency, unspecified: Secondary | ICD-10-CM

## 2022-10-28 DIAGNOSIS — E1142 Type 2 diabetes mellitus with diabetic polyneuropathy: Secondary | ICD-10-CM | POA: Diagnosis not present

## 2022-10-28 DIAGNOSIS — E782 Mixed hyperlipidemia: Secondary | ICD-10-CM | POA: Diagnosis not present

## 2022-10-28 DIAGNOSIS — I10 Essential (primary) hypertension: Secondary | ICD-10-CM | POA: Diagnosis not present

## 2022-10-28 LAB — CBC WITH DIFFERENTIAL/PLATELET
Basophils Absolute: 0 10*3/uL (ref 0.0–0.1)
Basophils Relative: 0.9 % (ref 0.0–3.0)
Eosinophils Absolute: 0.2 10*3/uL (ref 0.0–0.7)
Eosinophils Relative: 3 % (ref 0.0–5.0)
HCT: 47.8 % (ref 39.0–52.0)
Hemoglobin: 15.5 g/dL (ref 13.0–17.0)
Lymphocytes Relative: 39.1 % (ref 12.0–46.0)
Lymphs Abs: 2 10*3/uL (ref 0.7–4.0)
MCHC: 32.4 g/dL (ref 30.0–36.0)
MCV: 96.3 fl (ref 78.0–100.0)
Monocytes Absolute: 0.4 10*3/uL (ref 0.1–1.0)
Monocytes Relative: 7.6 % (ref 3.0–12.0)
Neutro Abs: 2.5 10*3/uL (ref 1.4–7.7)
Neutrophils Relative %: 49.4 % (ref 43.0–77.0)
Platelets: 148 10*3/uL — ABNORMAL LOW (ref 150.0–400.0)
RBC: 4.96 Mil/uL (ref 4.22–5.81)
RDW: 14.5 % (ref 11.5–15.5)
WBC: 5.1 10*3/uL (ref 4.0–10.5)

## 2022-10-28 LAB — COMPREHENSIVE METABOLIC PANEL
ALT: 20 U/L (ref 0–53)
AST: 22 U/L (ref 0–37)
Albumin: 4.2 g/dL (ref 3.5–5.2)
Alkaline Phosphatase: 74 U/L (ref 39–117)
BUN: 19 mg/dL (ref 6–23)
CO2: 30 mEq/L (ref 19–32)
Calcium: 9.1 mg/dL (ref 8.4–10.5)
Chloride: 101 mEq/L (ref 96–112)
Creatinine, Ser: 0.9 mg/dL (ref 0.40–1.50)
GFR: 81.91 mL/min (ref >60.00)
Glucose, Bld: 107 mg/dL — ABNORMAL HIGH (ref 70–99)
Potassium: 4.3 mEq/L (ref 3.5–5.1)
Sodium: 137 mEq/L (ref 135–145)
Total Bilirubin: 0.8 mg/dL (ref 0.2–1.2)
Total Protein: 6.8 g/dL (ref 6.0–8.3)

## 2022-10-28 LAB — LIPID PANEL
Cholesterol: 133 mg/dL (ref 0–200)
HDL: 36.9 mg/dL — ABNORMAL LOW (ref >39.00)
LDL Cholesterol: 74 mg/dL (ref 0–99)
NonHDL: 96.25
Total CHOL/HDL Ratio: 4
Triglycerides: 111 mg/dL (ref 0.0–149.0)
VLDL: 22.2 mg/dL (ref 0.0–40.0)

## 2022-10-28 LAB — HEMOGLOBIN A1C: Hgb A1c MFr Bld: 6.7 % — ABNORMAL HIGH (ref 4.6–6.5)

## 2022-10-28 LAB — TSH: TSH: 2.85 u[IU]/mL (ref 0.35–5.50)

## 2022-10-28 LAB — VITAMIN D 25 HYDROXY (VIT D DEFICIENCY, FRACTURES): VITD: 39.12 ng/mL (ref 30.00–100.00)

## 2022-11-03 NOTE — Assessment & Plan Note (Signed)
Well controlled, no changes to meds. Encouraged heart healthy diet such as the DASH diet and exercise as tolerated.  °

## 2022-11-03 NOTE — Assessment & Plan Note (Signed)
hgba1c acceptable, minimize simple carbs. Increase exercise as tolerated. Continue current meds. Reminded to increase protein and minimize carbs.

## 2022-11-03 NOTE — Assessment & Plan Note (Signed)
Supplement and monitor 

## 2022-11-03 NOTE — Assessment & Plan Note (Signed)
Tolerating statin, encouraged heart healthy diet, avoid trans fats, minimize simple carbs and saturated fats. Increase exercise as tolerated 

## 2022-11-04 ENCOUNTER — Ambulatory Visit: Payer: Medicare HMO | Admitting: Family Medicine

## 2022-11-04 VITALS — BP 118/62 | HR 52 | Temp 97.6°F | Resp 16 | Ht 70.0 in | Wt 238.6 lb

## 2022-11-04 DIAGNOSIS — H9193 Unspecified hearing loss, bilateral: Secondary | ICD-10-CM | POA: Diagnosis not present

## 2022-11-04 DIAGNOSIS — I10 Essential (primary) hypertension: Secondary | ICD-10-CM

## 2022-11-04 DIAGNOSIS — E782 Mixed hyperlipidemia: Secondary | ICD-10-CM | POA: Diagnosis not present

## 2022-11-04 DIAGNOSIS — E559 Vitamin D deficiency, unspecified: Secondary | ICD-10-CM | POA: Diagnosis not present

## 2022-11-04 DIAGNOSIS — E1142 Type 2 diabetes mellitus with diabetic polyneuropathy: Secondary | ICD-10-CM

## 2022-11-04 DIAGNOSIS — H9190 Unspecified hearing loss, unspecified ear: Secondary | ICD-10-CM | POA: Insufficient documentation

## 2022-11-04 NOTE — Assessment & Plan Note (Signed)
Bilateral and has recently gotten hearing aides from the [name] that have helped tremendously

## 2022-11-04 NOTE — Patient Instructions (Signed)
Netflix watch the [employer]    Hearing Loss Hearing loss is a partial or total loss of the ability to hear. This can be temporary or permanent, and it can happen in one or both ears. There are two types of hearing loss. You can have just one type or both types. You may have a problem with: Damage to your hearing nerves (sensorineural hearing loss). This type of hearing loss is more likely to be permanent. A hearing aid is often the best treatment. Sound getting to your inner ear (conductive hearing loss). This type of hearing loss can usually be treated medically or surgically. Medical care is necessary to treat hearing loss properly and to prevent the condition from getting worse. Your hearing may partially or completely come back, depending on what caused your hearing loss and how severe it is. In some cases, hearing loss is permanent. What are the causes? Common causes of hearing loss include: Too much wax in the ear canal. Infection of the ear canal or middle ear. Fluid in the middle ear. Injury to the ear or surrounding area. An object stuck in the ear. A history of prolonged exposure to loud sounds, such as music. Less common causes of hearing loss include: Tumors in the ear. Viral or bacterial infections, such as meningitis. A hole in the eardrum (perforated eardrum). Problems with the hearing nerve that sends signals between the brain and the ear. Certain medicines. What are the signs or symptoms? Symptoms of this condition may include: Difficulty telling the difference between sounds. Difficulty following a conversation when there is background noise. Lack of response to sounds in your environment. This may be most noticeable when you do not respond to startling sounds. Needing to turn up the volume on the television, radio, or other devices. Ringing in the ears. Dizziness. How is this diagnosed? This condition is diagnosed based on: A physical exam. A hearing test  (audiometry). The test will be performed by a hearing specialist (audiologist). Imaging tests, such as an MRI or CT scan. You may also be referred to an ear, nose, and throat (ENT) specialist (otolaryngologist). How is this treated? Treatment for hearing loss may include: Earwax removal. Medicines to treat or prevent infection (antibiotics). Medicines to reduce inflammation (corticosteroids). Hearing aids or assistive listening devices. Follow these instructions at home: If you were prescribed an antibiotic medicine, take it as told by your health care provider. Do not stop taking the antibiotic even if you start to feel better. Take over-the-counter and prescription medicines only as told by your health care provider. Avoid loud noises. Use hearing protection if you are exposed to loud noises or work in a noisy environment. Return to your normal activities as told by your health care provider. Ask your health care provider what activities are safe for you. Keep all follow-up visits. This is important. Contact a health care provider if: You feel dizzy. You develop new symptoms. You vomit or feel nauseous. You have a fever. Get help right away if: You develop sudden changes in your vision. You have severe ear pain. You have new or increased weakness. You have a severe headache. Summary Hearing loss is a decreased ability to hear sounds around you. It can be temporary or permanent. Treatment will depend on the cause of your hearing loss. It may include earwax removal, medicines, or a hearing aid. Your hearing may partially or completely come back, depending on what caused your hearing loss and how severe it is. Keep all  follow-up visits. This is important. This information is not intended to replace advice given to you by your health care provider. Make sure you discuss any questions you have with your health care provider. Document Revised: 01/08/2021 Document Reviewed:  01/08/2021 Elsevier Patient Education  2024 [employer].

## 2022-11-05 ENCOUNTER — Encounter: Payer: Self-pay | Admitting: Family Medicine

## 2022-11-05 NOTE — Progress Notes (Signed)
Subjective:    Patient ID: Michael Cunningham., male    DOB: 11/03/1945, 77 y.o.   MRN: [id number]  Chief Complaint  Patient presents with   Follow-up    Follow up    HPI Discussed the use of AI scribe software for clinical note transcription with the patient, who gave verbal consent to proceed.  History of Present Illness   The patient, a male with a history of diabetes, reports improved blood sugar control, with a recent A1C of 6.7. He attributes this improvement to the use of a Libre 3 device, which provides real-time blood sugar readings and allows him to make dietary adjustments as needed. He also mentions that he has learned certain foods, such as rice, cause his blood sugar to spike more than others.  The patient also discusses his new hearing aids, which he received from the [name]. He reports that they have significantly improved his hearing, particularly in the high-frequency range. He also appreciates the Bluetooth capabilities of the hearing aids, which allow him to listen to music and answer phone calls directly through the devices. He mentions that he has tinnitus, but it has become less noticeable since he started using the hearing aids.  The patient plans to get both the flu and COVID-19 vaccines when they become available at the [name]. He also mentions that he has received the RSV vaccine in the past.        Past Medical History:  Diagnosis Date   CAD (coronary artery disease)    a. 05/2014 [place] s/p DES x4 to mid-distLAD and DESx1 to DI   Complication of anesthesia    11'12 Surgery with excrutiating "head about to explode" upon awakening  -lasted several hours postop  ( SURGERY DONE 08-13-2011 AT [facility] PT DID OK POST OP)   Diverticulosis of colon    LEFT SIDE   Exposure to Agent [facility] 10/18/2016   [place]   History of small bowel obstruction    X2  28 YRS AGO AND LAST ONE 04/2014--  RESOLVED WITHOUT SURGICAL INTERVENTION   Hyperlipidemia    Hypertension    Knee pain,  bilateral 08/13/2011   Left ureteral calculus    Multiple lipomas    hx. mulitple and some remains -arms, legs   Obesity, unspecified 09/18/2013   Obstructive sleep apnea 01/01/2016   Recurrent kidney stones 07/08/2013   Follows with Dr Patsi Sears He believes they are Calcium oxalate stones   Thrombocytopenia (HCC) 07/07/2014   Type 2 diabetes mellitus (HCC)    Vitamin D deficiency 07/02/2014    Past Surgical History:  Procedure Laterality Date   COLONOSCOPY W/ POLYPECTOMY  LAST ONE 2013   CORONARY STENT PLACEMENT  05/17/2014   PROXIMAL X 4  MID & DISTAL LAD  DIAGONAL    KNEE ARTHROSCOPY  08/13/2011   right ARTHROSCOPY KNEE;  Surgeon: Loanne Drilling, MD;  Location: [facility];  Service: Orthopedics;  Laterality: Right;  medial  meniscal debridement, excision of plica, synovectomy   KNEE ARTHROSCOPY Left 11/16/2013   Procedure: LEFT KNEE ARTHROSCOPY MEDIAL MENISCAL DEBRIDEMENT, CHONDROPLASTY;  Surgeon: Loanne Drilling, MD;  Location: [facility];  Service: Orthopedics;  Laterality: Left;   KNEE ARTHROSCOPY W/ MENISCECTOMY Right 01-29-2011   twice, first trimmed mediscus and cyst   LEFT HEART CATHETERIZATION WITH CORONARY ANGIOGRAM N/A 05/17/2014   Procedure: LEFT HEART CATHETERIZATION WITH CORONARY ANGIOGRAM;  Surgeon: Kathleene Hazel, MD;  Location: [facility] CATH LAB;  Service: Cardiovascular;  Laterality: N/A;  LIPOMA EXCISION  1975   multiple, 10-12   PERCUTANEOUS CORONARY STENT INTERVENTION (PCI-S)  05/17/2014   Procedure: PERCUTANEOUS CORONARY STENT INTERVENTION (PCI-S);  Surgeon: Kathleene Hazel, MD;  Location: [facility] CATH LAB;  Service: Cardiovascular;;  Diag1 and Prox to Distal LAD    Family History  Problem Relation Age of Onset   Hypertension Mother    Cancer Mother 39       ovarian   Heart disease Father        CHF   Heart attack Father 64   Cancer Sister        breast- just finished her last radiation   Heart disease Maternal Grandfather        ?   Heart attack Paternal  Grandfather 10   Heart attack Paternal Uncle 45       Sudden death   Diabetes Neg Hx     Social History   Socioeconomic History   Marital status: Married    Spouse name: Not on file   Number of children: 2   Years of education: Not on file   Highest education level: Not on file  Occupational History   Not on file  Tobacco Use   Smoking status: Former    Current packs/day: 0.00    Average packs/day: 1 pack/day for 4.0 years (4.0 ttl pk-yrs)    Types: Cigarettes    Start date: 06/14/1963    Quit date: 06/14/1967    Years since quitting: 55.4   Smokeless tobacco: Never  Substance and Sexual Activity   Alcohol use: No    Alcohol/week: 0.0 standard drinks of alcohol   Drug use: No   Sexual activity: Yes    Comment: lives with wife  Other Topics Concern   Not on file  Social History Narrative   Retired International aid/development worker improvement.  Lives with wife.     Social Determinants of Health   Financial Resource Strain: Low Risk  (02/10/2021)   Overall Financial Resource Strain (CARDIA)    Difficulty of Paying Living Expenses: Not hard at all  Food Insecurity: No Food Insecurity (02/10/2022)   Hunger Vital Sign    Worried About Running Out of Food in the Last Year: Never true    Ran Out of Food in the Last Year: Never true  Transportation Needs: No Transportation Needs (02/10/2022)   PRAPARE - Administrator, Civil Service (Medical): No    Lack of Transportation (Non-Medical): No  Physical Activity: Inactive (02/10/2021)   Exercise Vital Sign    Days of Exercise per Week: 0 days    Minutes of Exercise per Session: 0 min  Stress: No Stress Concern Present (02/10/2021)   [employer] of Occupational Health - Occupational Stress Questionnaire    Feeling of Stress : Not at all  Social Connections: Socially Integrated (02/10/2021)   Social Connection and Isolation Panel [NHANES]    Frequency of Communication with Friends and Family: Not on file    Frequency of Social  Gatherings with Friends and Family: More than three times a week    Attends Religious Services: More than 4 times per year    Active Member of [employer] or Organizations: Yes    Attends Banker Meetings: More than 4 times per year    Marital Status: Married  Catering manager Violence: Not At Risk (02/10/2022)   Humiliation, Afraid, Rape, and Kick questionnaire    Fear of Current or Ex-Partner: No    Emotionally Abused: No  Physically Abused: No    Sexually Abused: No    Outpatient Medications Prior to Visit  Medication Sig Dispense Refill   Accu-Chek FastClix Lancets MISC Use to test blood sugar 2 times daily. Dx: E11.40 204 each 5   acetaminophen (TYLENOL) 500 MG tablet Take 1,000 mg by mouth every 6 (six) hours as needed for mild pain. Reported on 03/31/2015     aspirin EC 81 MG tablet Take 81 mg by mouth daily with breakfast.      Cholecalciferol 50 MCG (2000 UT) TABS Take 2 tablets by mouth daily.     empagliflozin (JARDIANCE) 10 MG TABS tablet Take 10 mg by mouth daily.     glucagon (GLUCAGON EMERGENCY) 1 MG injection Inject 1 mg into the muscle once as needed for up to 1 dose. 1 each 12   glucose-Vitamin C 4-0.006 GM CHEW chewable tablet CHEW FOUR TABLETS BY MOUTH  AS NEEDED (REPEAT EVERY 15 MINUTES IF BLOOD SUGAR LESS THAN 70)     insulin aspart (NOVOLOG) 100 UNIT/ML injection Inject 15 Units into the skin 3 (three) times daily before meals. Inject in skin before meals     insulin glargine (LANTUS) 100 UNIT/ML injection Inject 40-50 Units into the skin daily.     ipratropium (ATROVENT) 0.03 % nasal spray Place 2 sprays into the nose 3 (three) times daily.     losartan (COZAAR) 100 MG tablet Take 1 tablet (100 mg total) by mouth daily. (Patient taking differently: Take 75 mg by mouth daily.) 90 tablet 0   metoprolol tartrate (LOPRESSOR) 25 MG tablet Take 1 tablet (25 mg total) by mouth 2 (two) times daily. 180 tablet 3   Multiple Vitamin (MULTIVITAMIN WITH MINERALS) TABS  tablet Take 1 tablet by mouth daily.     nitroGLYCERIN (NITROSTAT) 0.4 MG SL tablet Place 1 tablet (0.4 mg total) under the tongue every 5 (five) minutes as needed for chest pain. 25 tablet 3   prasugrel (EFFIENT) 10 MG TABS tablet Take 1 tablet (10 mg total) by mouth daily. 90 tablet 3   simvastatin (ZOCOR) 40 MG tablet TAKE 1 TABLET (40 MG) BY MOUTH AT BEDTIME. 90 tablet 2   brimonidine (ALPHAGAN) 0.2 % ophthalmic solution Place 1 drop into both eyes 2 (two) times daily. 5 mL 2   clotrimazole-betamethasone (LOTRISONE) cream Apply 1 Application topically daily. 30 g 2   prednisoLONE acetate (PRED FORTE) 1 % ophthalmic suspension Place 1 drop into the left eye 4 (four) times daily until November 9. Then start placing 1 drop into the left eye 3 (three) times daily until changed. 10 mL 3   cetirizine (ZYRTEC) 10 MG tablet Take 1 tablet (10 mg total) by mouth 2 (two) times daily as needed for allergies. 100 tablet 3   No facility-administered medications prior to visit.    Allergies  Allergen Reactions   Ciprofloxacin Other (See Comments)    Abdominal pain   Lipitor [Atorvastatin Calcium] Other (See Comments)    Severe muscle pain   Lisinopril Cough   Metformin And Related Diarrhea    Review of Systems  Constitutional:  Negative for fever and malaise/fatigue.  HENT:  Positive for hearing loss and tinnitus. Negative for congestion, ear discharge and ear pain.   Eyes:  Negative for blurred vision.  Respiratory:  Negative for shortness of breath.   Cardiovascular:  Negative for chest pain, palpitations and leg swelling.  Gastrointestinal:  Negative for abdominal pain, blood in stool and nausea.  Genitourinary:  Negative for  dysuria and frequency.  Musculoskeletal:  Negative for falls.  Skin:  Negative for rash.  Neurological:  Negative for dizziness, loss of consciousness and headaches.  Endo/Heme/Allergies:  Negative for environmental allergies.  Psychiatric/Behavioral:  Negative for  depression. The patient is not nervous/anxious.        Objective:    Physical Exam Vitals reviewed.  Constitutional:      Appearance: Normal appearance. He is not ill-appearing.  HENT:     Head: Normocephalic and atraumatic.     Nose: Nose normal.  Eyes:     Conjunctiva/sclera: Conjunctivae normal.  Cardiovascular:     Rate and Rhythm: Normal rate.     Pulses: Normal pulses.     Heart sounds: Normal heart sounds. No murmur heard. Pulmonary:     Effort: Pulmonary effort is normal.     Breath sounds: Normal breath sounds. No wheezing.  Abdominal:     Palpations: Abdomen is soft. There is no mass.     Tenderness: There is no abdominal tenderness.  Musculoskeletal:     Cervical back: Normal range of motion.     Right lower leg: No edema.     Left lower leg: No edema.  Skin:    General: Skin is warm and dry.  Neurological:     General: No focal deficit present.     Mental Status: He is alert and oriented to person, place, and time.  Psychiatric:        Mood and Affect: Mood normal.     BP 118/62 (BP Location: Left Arm, Patient Position: Sitting, Cuff Size: Normal)   Pulse (!) 52   Temp 97.6 F (36.4 C) (Oral)   Resp 16   Ht 5\' 10"  (1.778 m)   Wt 238 lb 9.6 oz (108.2 kg)   SpO2 96%   BMI 34.24 kg/m  Wt Readings from Last 3 Encounters:  11/04/22 238 lb 9.6 oz (108.2 kg)  08/02/22 239 lb 3.2 oz (108.5 kg)  02/01/22 244 lb 3.2 oz (110.8 kg)    Diabetic Foot Exam - Simple   No data filed    Lab Results  Component Value Date   WBC 5.1 10/28/2022   HGB 15.5 10/28/2022   HCT 47.8 10/28/2022   PLT 148.0 (L) 10/28/2022   GLUCOSE 107 (H) 10/28/2022   CHOL 133 10/28/2022   TRIG 111.0 10/28/2022   HDL 36.90 (L) 10/28/2022   LDLDIRECT 108.0 04/16/2014   LDLCALC 74 10/28/2022   ALT 20 10/28/2022   AST 22 10/28/2022   NA 137 10/28/2022   K 4.3 10/28/2022   CL 101 10/28/2022   CREATININE 0.90 10/28/2022   BUN 19 10/28/2022   CO2 30 10/28/2022   TSH 2.85  10/28/2022   PSA 1.92 09/25/2018   INR 1.02 05/17/2014   HGBA1C 6.7 (H) 10/28/2022   MICROALBUR 1.7 06/24/2015    Lab Results  Component Value Date   TSH 2.85 10/28/2022   Lab Results  Component Value Date   WBC 5.1 10/28/2022   HGB 15.5 10/28/2022   HCT 47.8 10/28/2022   MCV 96.3 10/28/2022   PLT 148.0 (L) 10/28/2022   Lab Results  Component Value Date   NA 137 10/28/2022   K 4.3 10/28/2022   CO2 30 10/28/2022   GLUCOSE 107 (H) 10/28/2022   BUN 19 10/28/2022   CREATININE 0.90 10/28/2022   BILITOT 0.8 10/28/2022   ALKPHOS 74 10/28/2022   AST 22 10/28/2022   ALT 20 10/28/2022   PROT 6.8 10/28/2022  ALBUMIN 4.2 10/28/2022   CALCIUM 9.1 10/28/2022   ANIONGAP 8 05/18/2014   EGFR 81 06/03/2022   GFR 81.91 10/28/2022   Lab Results  Component Value Date   CHOL 133 10/28/2022   Lab Results  Component Value Date   HDL 36.90 (L) 10/28/2022   Lab Results  Component Value Date   LDLCALC 74 10/28/2022   Lab Results  Component Value Date   TRIG 111.0 10/28/2022   Lab Results  Component Value Date   CHOLHDL 4 10/28/2022   Lab Results  Component Value Date   HGBA1C 6.7 (H) 10/28/2022       Assessment & Plan:  Mixed hyperlipidemia Assessment & Plan: Tolerating statin, encouraged heart healthy diet, avoid trans fats, minimize simple carbs and saturated fats. Increase exercise as tolerated    Primary hypertension Assessment & Plan: Well controlled, no changes to meds. Encouraged heart healthy diet such as the DASH diet and exercise as tolerated.     Vitamin D deficiency Assessment & Plan: Supplement and monitor    Well controlled type 2 diabetes mellitus with peripheral neuropathy (HCC) Assessment & Plan: hgba1c acceptable, minimize simple carbs. Increase exercise as tolerated. Continue current meds. Reminded to increase protein and minimize carbs.     Bilateral hearing loss, unspecified hearing loss type Assessment & Plan: Bilateral and has  recently gotten hearing aides from the [name] that have helped tremendously     Assessment and Plan    Diabetes Mellitus Improved glycemic control with A1c at 6.7, likely due to use of Jardiance and continuous glucose monitoring with Libre 3. Patient reports ability to make dietary modifications based on real-time glucose readings. -Continue current management and monitor glucose levels with Libre 3.  Hearing Loss Recently fitted with high-quality hearing aids from the [name], which have significantly improved patient's quality of life and social engagement. -Continue use of hearing aids.  Immunizations Patient plans to receive both influenza and COVID-19 vaccines in the fall when available at the [name]. -Receive influenza and COVID-19 vaccines at the [name] in mid-September to mid-October.  Follow-up Patient to see endocrinologist in October. -Schedule next appointment with this office in January or February, following endocrinology visit.         Danise Edge, MD

## 2022-11-09 DIAGNOSIS — G4733 Obstructive sleep apnea (adult) (pediatric): Secondary | ICD-10-CM | POA: Diagnosis not present

## 2022-11-11 ENCOUNTER — Ambulatory Visit: Payer: Medicare HMO | Admitting: Podiatry

## 2022-11-11 DIAGNOSIS — B351 Tinea unguium: Secondary | ICD-10-CM

## 2022-11-11 DIAGNOSIS — M79675 Pain in left toe(s): Secondary | ICD-10-CM

## 2022-11-11 DIAGNOSIS — M79674 Pain in right toe(s): Secondary | ICD-10-CM

## 2022-11-11 DIAGNOSIS — E1149 Type 2 diabetes mellitus with other diabetic neurological complication: Secondary | ICD-10-CM

## 2022-11-17 NOTE — Progress Notes (Signed)
Subjective: No chief complaint on file.    77 y.o. returns the office today for painful, elongated, thickened toenails which he cannot trim himself. Denies any redness or drainage around the nails.  The color to the toes and the skin is much improved with the cream that he was using.  No open lesions that he reports.  PCP: Bradd Canary, MD -last seen November 04, 2022 Endocrinologist: Dr. Ernest Haber, MD   Last A1c was 6.7 on October 28, 2022  Objective: AAO 3, NAD DP/PT pulses palpable, CRT less than 3 seconds Sensation is mildly decreased with SWM but overall no change.  Neuropathy symptoms are unchanged. Nails hypertrophic, dystrophic, elongated, brittle, discolored 10. There is tenderness overlying the nails 1-5 bilaterally. There is no surrounding erythema or drainage along the nail sites. Some scaly skin still present to dorsal aspect of the toes but the redness, erythematous rash is improved.  There is no skin breakdown or warmth.  There is no drainage. No open lesions. No pain with calf compression, swelling, warmth, erythema.  Assessment: Patient presents with symptomatic onychomycosis; skin rash with improvement  Plan: -Treatment options including alternatives, risks, complications were discussed -Nails sharply debrided 10 without complication/bleeding. -Continue Lotrisone cream as needed. -Discussed daily foot inspection. If there are any changes, to call the office immediately.  -Follow-up as scheduled or sooner if any problems are to arise. In the meantime, encouraged to call the office with any questions, concerns, changes symptoms.  Return in about 2 months (around 01/11/2023).  Ovid Curd, DPM

## 2022-12-10 DIAGNOSIS — G4733 Obstructive sleep apnea (adult) (pediatric): Secondary | ICD-10-CM | POA: Diagnosis not present

## 2022-12-31 ENCOUNTER — Other Ambulatory Visit (HOSPITAL_BASED_OUTPATIENT_CLINIC_OR_DEPARTMENT_OTHER): Payer: Self-pay

## 2022-12-31 MED ORDER — INFLUENZA VAC A&B SURF ANT ADJ 0.5 ML IM SUSY
0.5000 mL | PREFILLED_SYRINGE | Freq: Once | INTRAMUSCULAR | 0 refills | Status: AC
  Filled 2022-12-31: qty 0.5, 1d supply, fill #0

## 2023-01-09 DIAGNOSIS — G4733 Obstructive sleep apnea (adult) (pediatric): Secondary | ICD-10-CM | POA: Diagnosis not present

## 2023-01-11 ENCOUNTER — Ambulatory Visit: Payer: Medicare HMO | Admitting: Podiatry

## 2023-01-11 ENCOUNTER — Encounter: Payer: Self-pay | Admitting: Podiatry

## 2023-01-11 DIAGNOSIS — M79675 Pain in left toe(s): Secondary | ICD-10-CM | POA: Diagnosis not present

## 2023-01-11 DIAGNOSIS — E1149 Type 2 diabetes mellitus with other diabetic neurological complication: Secondary | ICD-10-CM

## 2023-01-11 DIAGNOSIS — B351 Tinea unguium: Secondary | ICD-10-CM | POA: Diagnosis not present

## 2023-01-11 DIAGNOSIS — M79674 Pain in right toe(s): Secondary | ICD-10-CM | POA: Diagnosis not present

## 2023-01-11 NOTE — Progress Notes (Signed)
Subjective: Chief Complaint  Patient presents with   Routine Post Op    PATIENT STATES HE HAS NOT HAD ANY PAIN AND HAS NO MEDICATION FOR PAIN .     77 y.o. returns the office today for painful, elongated, thickened toenails which he cannot trim himself. Denies any redness or drainage around the nails.  No open lesions.   PCP: Bradd Canary, MD -last seen November 04, 2022 Endocrinologist: Dr. Ernest Haber, MD   Last A1c was 6.7 on October 28, 2022  Objective: AAO 3, NAD DP/PT pulses palpable, CRT less than 3 seconds Sensation is mildly decreased with SWM but overall no change.  Neuropathy symptoms are unchanged. Nails hypertrophic, dystrophic, elongated, brittle, discolored 10. There is tenderness overlying the nails 1-5 bilaterally. There is no surrounding erythema or drainage along the nail sites. Mild scaly skin still present to dorsal aspect of the toes but the redness, erythematous rash is improved.  There is no skin breakdown or warmth.  There is no drainage. No open lesions. No pain with calf compression, swelling, warmth, erythema.  Assessment: Patient presents with symptomatic onychomycosis; skin rash with improvement  Plan: -Treatment options including alternatives, risks, complications were discussed -Nails sharply debrided 10 without complication/bleeding. -Discussed daily foot inspection. If there are any changes, to call the office immediately.  -Follow-up as scheduled or sooner if any problems are to arise. In the meantime, encouraged to call the office with any questions, concerns, changes symptoms.  Return in about 2 months (around 03/14/2023) for nail trim .  Ovid Curd, DPM

## 2023-01-20 ENCOUNTER — Other Ambulatory Visit (HOSPITAL_BASED_OUTPATIENT_CLINIC_OR_DEPARTMENT_OTHER): Payer: Self-pay

## 2023-01-20 MED ORDER — COVID-19 MRNA VAC-TRIS(PFIZER) 30 MCG/0.3ML IM SUSY
0.3000 mL | PREFILLED_SYRINGE | Freq: Once | INTRAMUSCULAR | 0 refills | Status: AC
  Filled 2023-01-20: qty 0.3, 1d supply, fill #0

## 2023-01-27 NOTE — Progress Notes (Signed)
  Cardiology Office Note:   Date:  01/28/2023  ID:  Michael Cunningham., DOB [birth year], MRN [id number] PCP: Bradd Canary, MD  [facility] Providers Cardiologist:  Rollene Rotunda, MD {  History of Present Illness:   Michael Cunningham. is a 77 y.o. male who presents for follow-up of his known coronary disease.  Since I last saw him he has had no new cardiovascular complaints.  He enjoys hanging out with his grandchildren.  He also likes model trains.  He does a little bit of exercising.  With the aerobic exercising that he does he denies any new cardiovascular symptoms.The patient denies any new symptoms such as chest discomfort, neck or arm discomfort. There has been no new shortness of breath, PND or orthopnea. There have been no reported palpitations, presyncope or syncope.    ROS: As stated in the HPI and negative for all other systems.  Studies Reviewed:    EKG:   EKG Interpretation Date/Time:  Friday January 28 2023 13:50:04 EST Ventricular Rate:  54 PR Interval:  180 QRS Duration:  142 QT Interval:  454 QTC Calculation: 430 R Axis:   80  Text Interpretation: Sinus bradycardia Right bundle branch block When compared with ECG of 18-May-2014 04:49, No significant change was found Confirmed by Rollene Rotunda ([id number]) on 01/28/2023 2:02:07 PM    Risk Assessment/Calculations:              Physical Exam:   VS:  BP 134/88   Pulse (!) 54   Ht 5\' 10"  (1.778 m)   Wt 240 lb 3.2 oz (109 kg)   SpO2 95%   BMI 34.47 kg/m    Wt Readings from Last 3 Encounters:  01/28/23 240 lb 3.2 oz (109 kg)  11/04/22 238 lb 9.6 oz (108.2 kg)  08/02/22 239 lb 3.2 oz (108.5 kg)     GEN: Well nourished, well developed in no acute distress NECK: No JVD; No carotid bruits CARDIAC: RRR, no murmurs, rubs, gallops RESPIRATORY:  Clear to auscultation without rales, wheezing or rhonchi  ABDOMEN: Soft, non-tender, non-distended EXTREMITIES:  No edema; No deformity   ASSESSMENT AND PLAN:    CAD/PCI/DES:  The patient has no new sypmtoms.  No further cardiovascular testing is indicated.  We will continue with aggressive risk reduction and meds as listed.  HTN:  The blood pressure is at target.  No change in therapy.   OVERWEIGHT:    He is kept about 20 pounds off over the years and I would like continued weight loss and we talked about diet daily.  We talked about exercise.  DYSLIPIDEMIA:  His LDL was not quite at target at 74 and I would like to add Zetia 10 mg daily.  I will repeat a lipid profile in about 3.  DM: A1c was 6.7 which is improved on Jardiance.  No change in therapy.     Follow up with me in one year.   Signed, Rollene Rotunda, MD

## 2023-01-28 ENCOUNTER — Encounter: Payer: Self-pay | Admitting: Cardiology

## 2023-01-28 ENCOUNTER — Other Ambulatory Visit (HOSPITAL_BASED_OUTPATIENT_CLINIC_OR_DEPARTMENT_OTHER): Payer: Self-pay

## 2023-01-28 ENCOUNTER — Ambulatory Visit: Payer: Medicare HMO | Attending: Cardiology | Admitting: Cardiology

## 2023-01-28 VITALS — BP 134/88 | HR 54 | Ht 70.0 in | Wt 240.2 lb

## 2023-01-28 DIAGNOSIS — I10 Essential (primary) hypertension: Secondary | ICD-10-CM

## 2023-01-28 DIAGNOSIS — E785 Hyperlipidemia, unspecified: Secondary | ICD-10-CM

## 2023-01-28 DIAGNOSIS — E118 Type 2 diabetes mellitus with unspecified complications: Secondary | ICD-10-CM | POA: Diagnosis not present

## 2023-01-28 DIAGNOSIS — I2510 Atherosclerotic heart disease of native coronary artery without angina pectoris: Secondary | ICD-10-CM | POA: Diagnosis not present

## 2023-01-28 DIAGNOSIS — R001 Bradycardia, unspecified: Secondary | ICD-10-CM

## 2023-01-28 MED ORDER — EZETIMIBE 10 MG PO TABS
10.0000 mg | ORAL_TABLET | Freq: Every day | ORAL | 3 refills | Status: AC
  Filled 2023-01-28: qty 90, 90d supply, fill #0

## 2023-01-28 NOTE — Patient Instructions (Addendum)
Medication Instructions:  Your physician has recommended you make the following change in your medication:  1-START Zetia (Ezetimibe) 10 mg by mouth daily.  *If you need a refill on your cardiac medications before your next appointment, please call your pharmacy*  Lab Work: Your physician recommends that you have lab work in 3 months for fasting lipid panel and L(a)  If you have labs (blood work) drawn today and your tests are completely normal, you will receive your results only by: MyChart Message (if you have MyChart) OR A paper copy in the mail If you have any lab test that is abnormal or we need to change your treatment, we will call you to review the results.  Testing/Procedures: None ordered today.  Follow-Up: At [facility], you and your health needs are our priority.  As part of our continuing mission to provide you with exceptional heart care, we have created designated Provider Care Teams.  These Care Teams include your primary Cardiologist (physician) and Advanced Practice Providers (APPs -  Physician Assistants and Nurse Practitioners) who all work together to provide you with the care you need, when you need it.  We recommend signing up for the patient portal called "MyChart".  Sign up information is provided on this After Visit Summary.  MyChart is used to connect with patients for Virtual Visits (Telemedicine).  Patients are able to view lab/test results, encounter notes, upcoming appointments, etc.  Non-urgent messages can be sent to your provider as well.   To learn more about what you can do with MyChart, go to [email or web address].    Your next appointment:   1 year(s)  Provider:   Rollene Rotunda, MD

## 2023-02-02 ENCOUNTER — Telehealth: Payer: Self-pay | Admitting: Family Medicine

## 2023-02-02 NOTE — Telephone Encounter (Signed)
Copied from CRM [id number]. Topic: Medicare AWV >> Feb 02, 2023 10:32 AM Payton Doughty wrote: Reason for CRM: Called LVM 02/02/2023 to schedule Annual Wellness Visit  Verlee Rossetti; Care Guide Ambulatory Clinical Support [place] [facility] Direct Dial: [phone]

## 2023-02-17 ENCOUNTER — Encounter: Payer: Self-pay | Admitting: Pulmonary Disease

## 2023-03-24 ENCOUNTER — Ambulatory Visit: Payer: Medicare HMO | Admitting: Podiatry

## 2023-03-24 ENCOUNTER — Encounter: Payer: Self-pay | Admitting: Podiatry

## 2023-03-24 DIAGNOSIS — B351 Tinea unguium: Secondary | ICD-10-CM | POA: Diagnosis not present

## 2023-03-24 DIAGNOSIS — M79674 Pain in right toe(s): Secondary | ICD-10-CM

## 2023-03-24 DIAGNOSIS — E1149 Type 2 diabetes mellitus with other diabetic neurological complication: Secondary | ICD-10-CM

## 2023-03-24 DIAGNOSIS — M79675 Pain in left toe(s): Secondary | ICD-10-CM | POA: Diagnosis not present

## 2023-03-24 NOTE — Progress Notes (Signed)
 Subjective: Chief Complaint  Patient presents with   [facility]    RM#13 [facility] patient states he believes having changes in neuropathy.    78 y.o. returns the office today for painful, elongated, thickened toenails which he cannot trim himself. He states that his wife touched his foot and her hand was cold but he did not feel it.  No worsening numbness or tingling.  He is not taking anything for neuropathy at this time. No open lesions.   PCP: Domenica Harlene LABOR, MD -last seen November 04, 2022 Endocrinologist: Dr. Tawni Fendt, MD   Last A1c was 6.7 on October 28, 2022  Objective: AAO 3, NAD DP/PT pulses palpable, CRT less than 3 seconds Sensation is intact today  SWMF.  Nails hypertrophic, dystrophic, elongated, brittle, discolored 10. There is tenderness overlying the nails 1-5 bilaterally. There is no surrounding erythema or drainage along the nail sites. Mild scaly skin still present to dorsal aspect of the toes but the redness, erythematous rash is improved.  There is no skin breakdown or warmth.  There is no drainage. No open lesions. No pain with calf compression, swelling, warmth, erythema.  Assessment: Patient presents with symptomatic onychomycosis  Plan: -Treatment options including alternatives, risks, complications were discussed -Nails sharply debrided 10 without complication/bleeding. -Discussed neuropathy.  He has good sensation and is not changed with Semmes Weinstein monofilament.  We discussed treatment options for neuropathy recently continue to observe and monitor.  -Discussed daily foot inspection. If there are any changes, to call the office immediately.  -Follow-up as scheduled or sooner if any problems are to arise. In the meantime, encouraged to call the office with any questions, concerns, changes symptoms.  Return in about 2 months (around 05/25/2023).  Donnice Fees, DPM

## 2023-04-11 ENCOUNTER — Telehealth: Payer: Self-pay

## 2023-04-12 ENCOUNTER — Encounter: Payer: Self-pay | Admitting: Pulmonary Disease

## 2023-04-12 ENCOUNTER — Ambulatory Visit (INDEPENDENT_AMBULATORY_CARE_PROVIDER_SITE_OTHER): Payer: Medicare HMO | Admitting: Pulmonary Disease

## 2023-04-12 VITALS — BP 108/64 | HR 48 | Ht 70.0 in | Wt 239.4 lb

## 2023-04-12 DIAGNOSIS — G4733 Obstructive sleep apnea (adult) (pediatric): Secondary | ICD-10-CM

## 2023-04-12 NOTE — Progress Notes (Unsigned)
Subjective:    Patient ID: Michael Cunningham., male    DOB: 1946-02-15, 78 y.o.   MRN: [id number]  Patient with obstructive sleep apnea diagnosed in 2017  In for follow-up for obstructive sleep apnea Has been doing relatively well  Continues to use CPAP on a nightly basis  Recently received a new machine from the [name] system  Currently on a CPAP of 11  Sleep habits unchanged  No significant changes in his health since the last visit He does have a history of coronary artery disease, diabetes, hypercholesterolemia, hypertension  He continues to function well  Continues to try and remain active     Past Medical History:  Diagnosis Date   CAD (coronary artery disease)    a. 05/2014 [place] s/p DES x4 to mid-distLAD and DESx1 to DI   Complication of anesthesia    11'12 Surgery with excrutiating "head about to explode" upon awakening  -lasted several hours postop  ( SURGERY DONE 08-13-2011 AT [facility] PT DID OK POST OP)   Diverticulosis of colon    LEFT SIDE   Exposure to Agent [facility] 10/18/2016   [place]   History of small bowel obstruction    X2  28 YRS AGO AND LAST ONE 04/2014--  RESOLVED WITHOUT SURGICAL INTERVENTION   Hyperlipidemia    Hypertension    Knee pain, bilateral 08/13/2011   Left ureteral calculus    Multiple lipomas    hx. mulitple and some remains -arms, legs   Obesity, unspecified 09/18/2013   Obstructive sleep apnea 01/01/2016   Recurrent kidney stones 07/08/2013   Follows with Dr Patsi Sears He believes they are Calcium oxalate stones   Thrombocytopenia (HCC) 07/07/2014   Type 2 diabetes mellitus (HCC)    Vitamin D deficiency 07/02/2014   Social History   Socioeconomic History   Marital status: Married    Spouse name: Not on file   Number of children: 2   Years of education: Not on file   Highest education level: Not on file  Occupational History   Not on file  Tobacco Use   Smoking status: Former    Current packs/day: 0.00    Average packs/day: 1 pack/day  for 4.0 years (4.0 ttl pk-yrs)    Types: Cigarettes    Start date: 06/14/1963    Quit date: 06/14/1967    Years since quitting: 55.8   Smokeless tobacco: Never  Substance and Sexual Activity   Alcohol use: No    Alcohol/week: 0.0 standard drinks of alcohol   Drug use: No   Sexual activity: Yes    Comment: lives with wife  Other Topics Concern   Not on file  Social History Narrative   Retired International aid/development worker improvement.  Lives with wife.     Social Drivers of Corporate investment banker Strain: Low Risk  (02/10/2021)   Overall Financial Resource Strain (CARDIA)    Difficulty of Paying Living Expenses: Not hard at all  Food Insecurity: No Food Insecurity (02/10/2022)   Hunger Vital Sign    Worried About Running Out of Food in the Last Year: Never true    Ran Out of Food in the Last Year: Never true  Transportation Needs: No Transportation Needs (02/10/2022)   PRAPARE - Administrator, Civil Service (Medical): No    Lack of Transportation (Non-Medical): No  Physical Activity: Inactive (02/10/2021)   Exercise Vital Sign    Days of Exercise per Week: 0 days    Minutes of Exercise  per Session: 0 min  Stress: No Stress Concern Present (02/10/2021)   [employer] of Occupational Health - Occupational Stress Questionnaire    Feeling of Stress : Not at all  Social Connections: Socially Integrated (02/10/2021)   Social Connection and Isolation Panel [NHANES]    Frequency of Communication with Friends and Family: Not on file    Frequency of Social Gatherings with Friends and Family: More than three times a week    Attends Religious Services: More than 4 times per year    Active Member of [employer] or Organizations: Yes    Attends Engineer, structural: More than 4 times per year    Marital Status: Married  Catering manager Violence: Not At Risk (02/10/2022)   Humiliation, Afraid, Rape, and Kick questionnaire    Fear of Current or Ex-Partner: No    Emotionally Abused:  No    Physically Abused: No    Sexually Abused: No   Family History  Problem Relation Age of Onset   Hypertension Mother    Cancer Mother 24       ovarian   Heart disease Father        CHF   Heart attack Father 32   Cancer Sister        breast- just finished her last radiation   Heart disease Maternal Grandfather        ?   Heart attack Paternal Grandfather 51   Heart attack Paternal Uncle 42       Sudden death   Diabetes Neg Hx    Review of Systems  Constitutional:  Negative for fever and unexpected weight change.  HENT:  Negative for congestion, dental problem, ear pain, nosebleeds, postnasal drip, rhinorrhea, sinus pressure, sneezing, sore throat and trouble swallowing.   Eyes:  Negative for redness and itching.  Respiratory:  Positive for apnea. Negative for cough, chest tightness, shortness of breath and wheezing.   Cardiovascular:  Negative for palpitations and leg swelling.  Gastrointestinal:  Negative for nausea and vomiting.  Genitourinary:  Negative for dysuria.  Musculoskeletal:  Negative for joint swelling.  Skin:  Negative for rash.  Allergic/Immunologic: Negative.  Negative for environmental allergies, food allergies and immunocompromised state.  Neurological:  Negative for headaches.  Hematological:  Does not bruise/bleed easily.  Psychiatric/Behavioral:  Negative for dysphoric mood. The patient is not nervous/anxious.       Objective:   Physical Exam Constitutional:      Appearance: He is obese.  HENT:     Head: Normocephalic.     Nose: No congestion.     Mouth/Throat:     Comments: Mallampati 3, crowded oropharynx Eyes:     Pupils: Pupils are equal, round, and reactive to light.  Cardiovascular:     Rate and Rhythm: Normal rate and regular rhythm.     Heart sounds: No murmur heard.    No friction rub.  Pulmonary:     Effort: Pulmonary effort is normal. No respiratory distress.     Breath sounds: Normal breath sounds. No stridor. No wheezing or  rhonchi.  Musculoskeletal:     Cervical back: No rigidity or tenderness. No muscular tenderness.  Neurological:     Mental Status: He is alert.  Psychiatric:        Mood and Affect: Mood normal.    Vitals:   04/12/23 0949  BP: 108/64  Pulse: (!) 48  SpO2: 95%      02/15/2019    9:00 AM  Results of the  Epworth flowsheet  Sitting and reading 1  Watching TV 1  Sitting, inactive in a public place (e.g. a theatre or a meeting) 0  As a passenger in a car for an hour without a break 0  Lying down to rest in the afternoon when circumstances permit 0  Sitting and talking to someone 0  Sitting quietly after a lunch without alcohol 0  In a car, while stopped for a few minutes in traffic 0  Total score 2   Compliance reviewed showing excellent compliance 100% compliance Compliance reviewed on his phone with an average use of over 7 hours machine set at 11, residual AHI of 0.74     Assessment & Plan:  Obstructive sleep apnea -Adequately controlled -Continues to benefit from CPAP use -Has no concerns about CPAP use  Class II obesity -Encouraged to continue weight loss efforts  History of coronary artery disease -Status post stenting -Not having any significant symptoms at present   Plan: Continue CPAP therapy  No changes impression  Will try and get a download from [employer], current machine is from the [name] system He does have an app on his phone, he does monitor his compliance and AHI  Encouraged to call with significant concerns  Follow-up a year from now

## 2023-04-12 NOTE — Patient Instructions (Signed)
I will see you back a year from now  Continue using your machine at current setting  Call us with significant concerns

## 2023-04-14 NOTE — Telephone Encounter (Signed)
Dr. Wynona Neat, ?Please see patient message and advise.  Thank you. ?

## 2023-04-18 ENCOUNTER — Encounter: Payer: Self-pay | Admitting: Family Medicine

## 2023-04-19 ENCOUNTER — Encounter: Payer: Medicare HMO | Admitting: Family Medicine

## 2023-04-20 DIAGNOSIS — Z961 Presence of intraocular lens: Secondary | ICD-10-CM | POA: Diagnosis not present

## 2023-04-20 DIAGNOSIS — H52203 Unspecified astigmatism, bilateral: Secondary | ICD-10-CM | POA: Diagnosis not present

## 2023-04-20 DIAGNOSIS — E113293 Type 2 diabetes mellitus with mild nonproliferative diabetic retinopathy without macular edema, bilateral: Secondary | ICD-10-CM | POA: Diagnosis not present

## 2023-04-20 NOTE — Telephone Encounter (Signed)
 Called patient and spoke with his wife Joya). Patient was sitting right beside her. After consulting Dr. Domenica, patient agreed to do his physical on Thursday May 05, 2023 at 11:20 am. Since the appointment is late morning, he would like to have his blood work done the following day.

## 2023-05-04 NOTE — Assessment & Plan Note (Signed)
 Tolerating statin, encouraged heart healthy diet, avoid trans fats, minimize simple carbs and saturated fats. Increase exercise as tolerated

## 2023-05-04 NOTE — Assessment & Plan Note (Signed)
 hgba1c acceptable, minimize simple carbs. Increase exercise as tolerated. Continue current meds. Reminded to increase protein and minimize carbs.  His CGM 90 day average puts his A1C at 6.5

## 2023-05-04 NOTE — Assessment & Plan Note (Signed)
 Patient encouraged to maintain heart healthy diet, regular exercise, adequate sleep. Consider daily probiotics. Take medications as prescribed. Labs ordered and reviewed. Overdue for colonoscopy and has used Dr Ewing Schlein in past he agrees to follow up with that office and let us know if he needs our help

## 2023-05-04 NOTE — Assessment & Plan Note (Signed)
 Supplement and monitor

## 2023-05-04 NOTE — Assessment & Plan Note (Signed)
 Well controlled, no changes to meds. Encouraged heart healthy diet such as the DASH diet and exercise as tolerated.

## 2023-05-05 ENCOUNTER — Ambulatory Visit: Payer: Medicare HMO | Admitting: Family Medicine

## 2023-05-05 ENCOUNTER — Encounter: Payer: Self-pay | Admitting: Family Medicine

## 2023-05-05 VITALS — BP 126/62 | HR 48 | Resp 16 | Ht 70.0 in | Wt 238.6 lb

## 2023-05-05 DIAGNOSIS — E559 Vitamin D deficiency, unspecified: Secondary | ICD-10-CM | POA: Diagnosis not present

## 2023-05-05 DIAGNOSIS — Z0000 Encounter for general adult medical examination without abnormal findings: Secondary | ICD-10-CM

## 2023-05-05 DIAGNOSIS — E1142 Type 2 diabetes mellitus with diabetic polyneuropathy: Secondary | ICD-10-CM | POA: Diagnosis not present

## 2023-05-05 DIAGNOSIS — I10 Essential (primary) hypertension: Secondary | ICD-10-CM

## 2023-05-05 DIAGNOSIS — E782 Mixed hyperlipidemia: Secondary | ICD-10-CM | POA: Diagnosis not present

## 2023-05-05 NOTE — Progress Notes (Signed)
 Subjective:    Patient ID: Michael Carnes., male    DOB: July 10, 1945, 78 y.o.   MRN: [id number]  Chief Complaint  Patient presents with   Annual Exam    HPI Discussed the use of AI scribe software for clinical note transcription with the patient, who gave verbal consent to proceed.  History of Present Illness   The patient is a male with a history of diabetes, who presents for a routine check-up. He reports that his blood sugars have been "really low" and "really good." He mentions that he had a recent eye exam, which showed mild retinopathy. This is a change from the previous report, which showed no retinopathy. Despite the change, the patient does not express any concerns about his vision or eye health.  The patient also discusses his cholesterol levels, stating that his last cholesterol level was 70. He mentions that he had recent blood work done at the [name], including a lipid panel and lipoprotein A test, but the results are not yet available.  In terms of physical activity, the patient reports that he has been tracking his steps with a Fitbit and typically achieves 4000 steps per day, sometimes reaching 5000 or 6000 steps. He also mentions that he has been trying to achieve 150 intensity minutes per week, as recommended by his previous doctor.  The patient also brings up a skin issue, describing a scaly patch that doesn't heal.        Past Medical History:  Diagnosis Date   CAD (coronary artery disease)    a. 05/2014 [place] s/p DES x4 to mid-distLAD and DESx1 to DI   Complication of anesthesia    11'12 Surgery with excrutiating "head about to explode" upon awakening  -lasted several hours postop  ( SURGERY DONE 08-13-2011 AT [facility] PT DID OK POST OP)   Diverticulosis of colon    LEFT SIDE   Exposure to Agent [facility] 10/18/2016   [place]   History of small bowel obstruction    X2  28 YRS AGO AND LAST ONE 04/2014--  RESOLVED WITHOUT SURGICAL INTERVENTION   Hyperlipidemia    Hypertension     Knee pain, bilateral 08/13/2011   Left ureteral calculus    Multiple lipomas    hx. mulitple and some remains -arms, legs   Obesity, unspecified 09/18/2013   Obstructive sleep apnea 01/01/2016   Recurrent kidney stones 07/08/2013   Follows with Dr Patsi Sears He believes they are Calcium oxalate stones   Thrombocytopenia (HCC) 07/07/2014   Type 2 diabetes mellitus (HCC)    Vitamin D deficiency 07/02/2014    Past Surgical History:  Procedure Laterality Date   COLONOSCOPY W/ POLYPECTOMY  LAST ONE 2013   CORONARY STENT PLACEMENT  05/17/2014   PROXIMAL X 4  MID & DISTAL LAD  DIAGONAL    KNEE ARTHROSCOPY  08/13/2011   right ARTHROSCOPY KNEE;  Surgeon: Loanne Drilling, MD;  Location: [facility];  Service: Orthopedics;  Laterality: Right;  medial  meniscal debridement, excision of plica, synovectomy   KNEE ARTHROSCOPY Left 11/16/2013   Procedure: LEFT KNEE ARTHROSCOPY MEDIAL MENISCAL DEBRIDEMENT, CHONDROPLASTY;  Surgeon: Loanne Drilling, MD;  Location: [facility];  Service: Orthopedics;  Laterality: Left;   KNEE ARTHROSCOPY W/ MENISCECTOMY Right 01-29-2011   twice, first trimmed mediscus and cyst   LEFT HEART CATHETERIZATION WITH CORONARY ANGIOGRAM N/A 05/17/2014   Procedure: LEFT HEART CATHETERIZATION WITH CORONARY ANGIOGRAM;  Surgeon: Kathleene Hazel, MD;  Location: [facility] CATH LAB;  Service: Cardiovascular;  Laterality: N/A;   LIPOMA EXCISION  1975   multiple, 10-12   PERCUTANEOUS CORONARY STENT INTERVENTION (PCI-S)  05/17/2014   Procedure: PERCUTANEOUS CORONARY STENT INTERVENTION (PCI-S);  Surgeon: Kathleene Hazel, MD;  Location: [facility] CATH LAB;  Service: Cardiovascular;;  Diag1 and Prox to Distal LAD    Family History  Problem Relation Age of Onset   Hypertension Mother    Cancer Mother 14       ovarian   Heart disease Father        CHF   Heart attack Father 87   Cancer Sister        breast- just finished her last radiation   Heart disease Maternal Grandfather        ?   Heart attack  Paternal Grandfather 72   Heart attack Paternal Uncle 59       Sudden death   Diabetes Neg Hx     Social History   Socioeconomic History   Marital status: Married    Spouse name: Not on file   Number of children: 2   Years of education: Not on file   Highest education level: Not on file  Occupational History   Not on file  Tobacco Use   Smoking status: Former    Current packs/day: 0.00    Average packs/day: 1 pack/day for 4.0 years (4.0 ttl pk-yrs)    Types: Cigarettes    Start date: 06/14/1963    Quit date: 06/14/1967    Years since quitting: 55.9   Smokeless tobacco: Never  Substance and Sexual Activity   Alcohol use: No    Alcohol/week: 0.0 standard drinks of alcohol   Drug use: No   Sexual activity: Yes    Comment: lives with wife  Other Topics Concern   Not on file  Social History Narrative   Retired International aid/development worker improvement.  Lives with wife.     Social Drivers of Corporate investment banker Strain: Low Risk  (02/10/2021)   Overall Financial Resource Strain (CARDIA)    Difficulty of Paying Living Expenses: Not hard at all  Food Insecurity: No Food Insecurity (02/10/2022)   Hunger Vital Sign    Worried About Running Out of Food in the Last Year: Never true    Ran Out of Food in the Last Year: Never true  Transportation Needs: No Transportation Needs (02/10/2022)   PRAPARE - Administrator, Civil Service (Medical): No    Lack of Transportation (Non-Medical): No  Physical Activity: Inactive (02/10/2021)   Exercise Vital Sign    Days of Exercise per Week: 0 days    Minutes of Exercise per Session: 0 min  Stress: No Stress Concern Present (02/10/2021)   [employer] of Occupational Health - Occupational Stress Questionnaire    Feeling of Stress : Not at all  Social Connections: Socially Integrated (02/10/2021)   Social Connection and Isolation Panel [NHANES]    Frequency of Communication with Friends and Family: Not on file    Frequency of Social  Gatherings with Friends and Family: More than three times a week    Attends Religious Services: More than 4 times per year    Active Member of [employer] or Organizations: Yes    Attends Banker Meetings: More than 4 times per year    Marital Status: Married  Catering manager Violence: Not At Risk (02/10/2022)   Humiliation, Afraid, Rape, and Kick questionnaire    Fear of Current or Ex-Partner: No  Emotionally Abused: No    Physically Abused: No    Sexually Abused: No    Outpatient Medications Prior to Visit  Medication Sig Dispense Refill   Accu-Chek FastClix Lancets MISC Use to test blood sugar 2 times daily. Dx: E11.40 204 each 5   acetaminophen (TYLENOL) 500 MG tablet Take 1,000 mg by mouth every 6 (six) hours as needed for mild pain. Reported on 03/31/2015     aspirin EC 81 MG tablet Take 81 mg by mouth daily with breakfast.      Cholecalciferol 50 MCG (2000 UT) TABS Take 2 tablets by mouth daily.     empagliflozin (JARDIANCE) 10 MG TABS tablet Take 10 mg by mouth daily.     ezetimibe (ZETIA) 10 MG tablet Take 1 tablet (10 mg total) by mouth daily. 90 tablet 3   glucagon (GLUCAGON EMERGENCY) 1 MG injection Inject 1 mg into the muscle once as needed for up to 1 dose. 1 each 12   glucose-Vitamin C 4-0.006 GM CHEW chewable tablet CHEW FOUR TABLETS BY MOUTH  AS NEEDED (REPEAT EVERY 15 MINUTES IF BLOOD SUGAR LESS THAN 70)     insulin aspart (NOVOLOG) 100 UNIT/ML injection Inject 15 Units into the skin 3 (three) times daily before meals. Inject in skin before meals     insulin glargine (LANTUS) 100 UNIT/ML injection Inject 40-50 Units into the skin daily.     ipratropium (ATROVENT) 0.03 % nasal spray Place 2 sprays into the nose 3 (three) times daily.     losartan (COZAAR) 100 MG tablet Take 1 tablet (100 mg total) by mouth daily. (Patient taking differently: Take 75 mg by mouth daily.) 90 tablet 0   metoprolol tartrate (LOPRESSOR) 25 MG tablet Take 1 tablet (25 mg total) by mouth  2 (two) times daily. 180 tablet 3   Multiple Vitamin (MULTIVITAMIN WITH MINERALS) TABS tablet Take 1 tablet by mouth daily.     nitroGLYCERIN (NITROSTAT) 0.4 MG SL tablet Place 1 tablet (0.4 mg total) under the tongue every 5 (five) minutes as needed for chest pain. 25 tablet 3   prasugrel (EFFIENT) 10 MG TABS tablet Take 1 tablet (10 mg total) by mouth daily. 90 tablet 3   simvastatin (ZOCOR) 40 MG tablet TAKE 1 TABLET (40 MG) BY MOUTH AT BEDTIME. 90 tablet 2   No facility-administered medications prior to visit.    Allergies  Allergen Reactions   Ciprofloxacin Other (See Comments)    Abdominal pain   Lipitor [Atorvastatin Calcium] Other (See Comments)    Severe muscle pain   Lisinopril Cough   Metformin And Related Diarrhea    Review of Systems  Constitutional:  Negative for chills, fever and malaise/fatigue.  HENT:  Negative for congestion and hearing loss.   Eyes:  Negative for discharge.  Respiratory:  Negative for cough, sputum production and shortness of breath.   Cardiovascular:  Negative for chest pain, palpitations and leg swelling.  Gastrointestinal:  Negative for abdominal pain, blood in stool, constipation, diarrhea, heartburn, nausea and vomiting.  Genitourinary:  Negative for dysuria, frequency, hematuria and urgency.  Musculoskeletal:  Negative for back pain, falls and myalgias.  Skin:  Positive for rash. Negative for itching.  Neurological:  Negative for dizziness, sensory change, loss of consciousness, weakness and headaches.  Endo/Heme/Allergies:  Negative for environmental allergies. Does not bruise/bleed easily.  Psychiatric/Behavioral:  Negative for depression and suicidal ideas. The patient is not nervous/anxious and does not have insomnia.        Objective:  Physical Exam Vitals reviewed.  Constitutional:      General: He is not in acute distress.    Appearance: Normal appearance. He is obese. He is not ill-appearing or diaphoretic.  HENT:      Head: Normocephalic and atraumatic.     Right Ear: Tympanic membrane, ear canal and external ear normal. There is no impacted cerumen.     Left Ear: Tympanic membrane, ear canal and external ear normal. There is no impacted cerumen.     Nose: Nose normal. No rhinorrhea.     Mouth/Throat:     Pharynx: Oropharynx is clear.  Eyes:     General: No scleral icterus.    Extraocular Movements: Extraocular movements intact.     Conjunctiva/sclera: Conjunctivae normal.     Pupils: Pupils are equal, round, and reactive to light.  Neck:     Thyroid: No thyroid mass or thyroid tenderness.  Cardiovascular:     Rate and Rhythm: Normal rate and regular rhythm.     Pulses: Normal pulses.     Heart sounds: Normal heart sounds. No murmur heard. Pulmonary:     Effort: Pulmonary effort is normal.     Breath sounds: Normal breath sounds. No wheezing.  Abdominal:     General: Bowel sounds are normal.     Palpations: Abdomen is soft. There is no mass.     Tenderness: There is no guarding.  Musculoskeletal:        General: No swelling. Normal range of motion.     Cervical back: Normal range of motion and neck supple. No rigidity.     Right lower leg: No edema.     Left lower leg: No edema.  Lymphadenopathy:     Cervical: No cervical adenopathy.  Skin:    General: Skin is warm and dry.     Findings: No rash.  Neurological:     General: No focal deficit present.     Mental Status: He is alert and oriented to person, place, and time.     Cranial Nerves: No cranial nerve deficit.     Deep Tendon Reflexes: Reflexes normal.  Psychiatric:        Mood and Affect: Mood normal.        Behavior: Behavior normal.     BP 126/62 (BP Location: Left Arm, Patient Position: Sitting, Cuff Size: Large)   Pulse (!) 48   Resp 16   Ht 5\' 10"  (1.778 m)   Wt 238 lb 9.6 oz (108.2 kg)   SpO2 96%   BMI 34.24 kg/m  Wt Readings from Last 3 Encounters:  05/05/23 238 lb 9.6 oz (108.2 kg)  04/12/23 239 lb 6.4 oz (108.6  kg)  01/28/23 240 lb 3.2 oz (109 kg)    Diabetic Foot Exam - Simple   No data filed    Lab Results  Component Value Date   WBC 5.1 10/28/2022   HGB 15.5 10/28/2022   HCT 47.8 10/28/2022   PLT 148.0 (L) 10/28/2022   GLUCOSE 107 (H) 10/28/2022   CHOL 133 10/28/2022   TRIG 111.0 10/28/2022   HDL 36.90 (L) 10/28/2022   LDLDIRECT 108.0 04/16/2014   LDLCALC 74 10/28/2022   ALT 20 10/28/2022   AST 22 10/28/2022   NA 137 10/28/2022   K 4.3 10/28/2022   CL 101 10/28/2022   CREATININE 0.90 10/28/2022   BUN 19 10/28/2022   CO2 30 10/28/2022   TSH 2.85 10/28/2022   PSA 1.92 09/25/2018   INR 1.02 05/17/2014  HGBA1C 6.7 (H) 10/28/2022   MICROALBUR 1.7 06/24/2015    Lab Results  Component Value Date   TSH 2.85 10/28/2022   Lab Results  Component Value Date   WBC 5.1 10/28/2022   HGB 15.5 10/28/2022   HCT 47.8 10/28/2022   MCV 96.3 10/28/2022   PLT 148.0 (L) 10/28/2022   Lab Results  Component Value Date   NA 137 10/28/2022   K 4.3 10/28/2022   CO2 30 10/28/2022   GLUCOSE 107 (H) 10/28/2022   BUN 19 10/28/2022   CREATININE 0.90 10/28/2022   BILITOT 0.8 10/28/2022   ALKPHOS 74 10/28/2022   AST 22 10/28/2022   ALT 20 10/28/2022   PROT 6.8 10/28/2022   ALBUMIN 4.2 10/28/2022   CALCIUM 9.1 10/28/2022   ANIONGAP 8 05/18/2014   EGFR 81 06/03/2022   GFR 81.91 10/28/2022   Lab Results  Component Value Date   CHOL 133 10/28/2022   Lab Results  Component Value Date   HDL 36.90 (L) 10/28/2022   Lab Results  Component Value Date   LDLCALC 74 10/28/2022   Lab Results  Component Value Date   TRIG 111.0 10/28/2022   Lab Results  Component Value Date   CHOLHDL 4 10/28/2022   Lab Results  Component Value Date   HGBA1C 6.7 (H) 10/28/2022       Assessment & Plan:  Preventative health care Assessment & Plan: Patient encouraged to maintain heart healthy diet, regular exercise, adequate sleep. Consider daily probiotics. Take medications as prescribed. Labs  ordered and reviewed. Overdue for colonoscopy and has used Dr Ewing Schlein in past he agrees to follow up with that office and let us know if he needs our help   Mixed hyperlipidemia Assessment & Plan: Tolerating statin, encouraged heart healthy diet, avoid trans fats, minimize simple carbs and saturated fats. Increase exercise as tolerated    Primary hypertension Assessment & Plan: Well controlled, no changes to meds. Encouraged heart healthy diet such as the DASH diet and exercise as tolerated.     Vitamin D deficiency Assessment & Plan: Supplement and monitor    Well controlled type 2 diabetes mellitus with peripheral neuropathy (HCC) Assessment & Plan: hgba1c acceptable, minimize simple carbs. Increase exercise as tolerated. Continue current meds. Reminded to increase protein and minimize carbs.  His CGM 90 day average puts his A1C at 6.5     Assessment and Plan    Diabetes Mellitus Well controlled with an A1c of 6.5. History of fluctuating retinopathy. Discussed the importance of maintaining good glycemic control, healthy diet, and regular exercise to prevent progression of retinopathy. -Continue current diabetes management plan.  Hyperlipidemia Recent lipid panel and lipoprotein A drawn at the [name]. Awaiting results. -Review results when available.  Skin Health Multiple seborrheic keratoses and cherry angiomas noted on examination. One scaly patch of concern identified. -Refer to dermatology for further evaluation of the scaly patch.  General Health Maintenance -Discussed the importance of regular hydration and protein intake. -Discussed the potential for genetic testing through [facility] in the future. -Continue with regular exercise, aiming for 150 intensity minutes per week. -Consider obtaining a set of home COVID/flu tests. -Continue with regular dental care through the [name]. -Consider resuming regular dermatology visits. -Consider colonoscopy, last one performed in  2013.         Danise Edge, MD

## 2023-05-05 NOTE — Patient Instructions (Addendum)
 Preventive Care 13 Years and Older, Male Preventive care refers to lifestyle choices and visits with your health care provider that can promote health and wellness. Preventive care visits are also called wellness exams.  10  ounces every 1-2 hours and protein 3-4 hours What can I expect for my preventive care visit? Counseling During your preventive care visit, your health care provider may ask about your: Medical history, including: Past medical problems. Family medical history. History of falls. Current health, including: Emotional well-being. Home life and relationship well-being. Sexual activity. Memory and ability to understand (cognition). Lifestyle, including: Alcohol, nicotine or tobacco, and drug use. Access to firearms. Diet, exercise, and sleep habits. Work and work Astronomer. Sunscreen use. Safety issues such as seatbelt and bike helmet use. Physical exam Your health care provider will check your: Height and weight. These may be used to calculate your BMI (body mass index). BMI is a measurement that tells if you are at a healthy weight. Waist circumference. This measures the distance around your waistline. This measurement also tells if you are at a healthy weight and may help predict your risk of certain diseases, such as type 2 diabetes and high blood pressure. Heart rate and blood pressure. Body temperature. Skin for abnormal spots. What immunizations do I need?  Vaccines are usually given at various ages, according to a schedule. Your health care provider will recommend vaccines for you based on your age, medical history, and lifestyle or other factors, such as travel or where you work. What tests do I need? Screening Your health care provider may recommend screening tests for certain conditions. This may include: Lipid and cholesterol levels. Diabetes screening. This is done by checking your blood sugar (glucose) after you have not eaten for a while  (fasting). Hepatitis C test. Hepatitis B test. HIV (human immunodeficiency virus) test. STI (sexually transmitted infection) testing, if you are at risk. Lung cancer screening. Colorectal cancer screening. Prostate cancer screening. Abdominal aortic aneurysm (AAA) screening. You may need this if you are a current or former smoker. Talk with your health care provider about your test results, treatment options, and if necessary, the need for more tests. Follow these instructions at home: Eating and drinking  Eat a diet that includes fresh fruits and vegetables, whole grains, lean protein, and low-fat dairy products. Limit your intake of foods with high amounts of sugar, saturated fats, and salt. Take vitamin and mineral supplements as recommended by your health care provider. Do not drink alcohol if your health care provider tells you not to drink. If you drink alcohol: Limit how much you have to 0-2 drinks a day. Know how much alcohol is in your drink. In the [place] one drink equals one 12 oz bottle of beer (355 mL), one 5 oz glass of wine (148 mL), or one 1 oz glass of hard liquor (44 mL). Lifestyle Brush your teeth every morning and night with fluoride toothpaste. Floss one time each day. Exercise for at least 30 minutes 5 or more days each week. Do not use any products that contain nicotine or tobacco. These products include cigarettes, chewing tobacco, and vaping devices, such as e-cigarettes. If you need help quitting, ask your health care provider. Do not use drugs. If you are sexually active, practice safe sex. Use a condom or other form of protection to prevent STIs. Take aspirin only as told by your health care provider. Make sure that you understand how much to take and what form to take. Work with  your health care provider to find out whether it is safe and beneficial for you to take aspirin daily. Ask your health care provider if you need to take a cholesterol-lowering medicine  (statin). Find healthy ways to manage stress, such as: Meditation, yoga, or listening to music. Journaling. Talking to a trusted person. Spending time with friends and family. Safety Always wear your seat belt while driving or riding in a vehicle. Do not drive: If you have been drinking alcohol. Do not ride with someone who has been drinking. When you are tired or distracted. While texting. If you have been using any mind-altering substances or drugs. Wear a helmet and other protective equipment during sports activities. If you have firearms in your house, make sure you follow all gun safety procedures. Minimize exposure to UV radiation to reduce your risk of skin cancer. What's next? Visit your health care provider once a year for an annual wellness visit. Ask your health care provider how often you should have your eyes and teeth checked. Stay up to date on all vaccines. This information is not intended to replace advice given to you by your health care provider. Make sure you discuss any questions you have with your health care provider. Document Revised: 08/27/2020 Document Reviewed: 08/27/2020 Elsevier Patient Education  2024 [employer].

## 2023-05-18 ENCOUNTER — Encounter: Payer: Self-pay | Admitting: Cardiology

## 2023-05-18 ENCOUNTER — Encounter: Payer: Self-pay | Admitting: Family Medicine

## 2023-05-24 ENCOUNTER — Ambulatory Visit (INDEPENDENT_AMBULATORY_CARE_PROVIDER_SITE_OTHER): Payer: Medicare HMO | Admitting: Podiatry

## 2023-05-24 ENCOUNTER — Encounter: Payer: Self-pay | Admitting: Podiatry

## 2023-05-24 DIAGNOSIS — B351 Tinea unguium: Secondary | ICD-10-CM

## 2023-05-24 DIAGNOSIS — M79674 Pain in right toe(s): Secondary | ICD-10-CM

## 2023-05-24 DIAGNOSIS — E1149 Type 2 diabetes mellitus with other diabetic neurological complication: Secondary | ICD-10-CM | POA: Diagnosis not present

## 2023-05-24 DIAGNOSIS — M79675 Pain in left toe(s): Secondary | ICD-10-CM

## 2023-05-25 NOTE — Progress Notes (Signed)
 Subjective: Chief Complaint  Patient presents with   [facility]    RM#13 [facility] Patient has no concerns today just here for routine care.     78 y.o. returns the office today for painful, elongated, thickened toenails which he cannot trim himself. No open lesions. No new concerns.   PCP: Bradd Canary, MD -last seen 05/05/2023 Endocrinologist: Dr. Ernest Haber, MD   Last A1c was 6.7 on October 28, 2022  Objective: AAO 3, NAD DP/PT pulses palpable, CRT less than 3 seconds Sensation is intact today  SWMF.  Nails hypertrophic, dystrophic, elongated, brittle, discolored 10. There is tenderness overlying the nails 1-5 bilaterally. There is no surrounding erythema or drainage along the nail sites. No open lesions. No pain with calf compression, swelling, warmth, erythema.  Assessment: Patient presents with symptomatic onychomycosis  Plan: -Treatment options including alternatives, risks, complications were discussed -Nails sharply debrided 10 without complication/bleeding. -Discussed daily foot inspection. If there are any changes, to call the office immediately.  -Follow-up as scheduled or sooner if any problems are to arise. In the meantime, encouraged to call the office with any questions, concerns, changes symptoms.  Return in about 2 months (around 07/25/2023).  Ovid Curd, DPM

## 2023-06-13 LAB — BASIC METABOLIC PANEL WITH GFR: Creatinine: 0.9 (ref 0.6–1.3)

## 2023-06-13 LAB — HEMOGLOBIN A1C: Hemoglobin A1C: 7.1

## 2023-06-13 LAB — MICROALBUMIN / CREATININE URINE RATIO: Microalb Creat Ratio: UNDETERMINED

## 2023-06-13 LAB — MICROALBUMIN, URINE: Microalb, Ur: <0.3

## 2023-06-13 LAB — COMPREHENSIVE METABOLIC PANEL WITH GFR: eGFR: 83

## 2023-06-13 LAB — PROTEIN / CREATININE RATIO, URINE
Albumin, U: <0.3
Creatinine, Urine: 83.8

## 2023-07-04 ENCOUNTER — Encounter: Payer: Self-pay | Admitting: Family Medicine

## 2023-07-12 ENCOUNTER — Other Ambulatory Visit (HOSPITAL_BASED_OUTPATIENT_CLINIC_OR_DEPARTMENT_OTHER): Payer: Self-pay

## 2023-07-12 DIAGNOSIS — M25551 Pain in right hip: Secondary | ICD-10-CM | POA: Diagnosis not present

## 2023-07-12 MED ORDER — TIZANIDINE HCL 2 MG PO TABS
2.0000 mg | ORAL_TABLET | Freq: Every day | ORAL | 0 refills | Status: DC
Start: 2023-07-12 — End: 2023-12-06
  Filled 2023-07-12: qty 10, 10d supply, fill #0

## 2023-07-12 MED ORDER — PREDNISONE 5 MG (21) PO TBPK
ORAL_TABLET | ORAL | 0 refills | Status: AC
  Filled 2023-07-12: qty 21, 6d supply, fill #0

## 2023-08-25 ENCOUNTER — Encounter: Payer: Self-pay | Admitting: Cardiology

## 2023-08-25 ENCOUNTER — Ambulatory Visit: Admitting: Podiatry

## 2023-08-25 DIAGNOSIS — E1149 Type 2 diabetes mellitus with other diabetic neurological complication: Secondary | ICD-10-CM

## 2023-08-25 DIAGNOSIS — M79674 Pain in right toe(s): Secondary | ICD-10-CM | POA: Diagnosis not present

## 2023-08-25 DIAGNOSIS — M79675 Pain in left toe(s): Secondary | ICD-10-CM

## 2023-08-25 DIAGNOSIS — B351 Tinea unguium: Secondary | ICD-10-CM | POA: Diagnosis not present

## 2023-08-25 DIAGNOSIS — I2510 Atherosclerotic heart disease of native coronary artery without angina pectoris: Secondary | ICD-10-CM

## 2023-08-25 DIAGNOSIS — E785 Hyperlipidemia, unspecified: Secondary | ICD-10-CM

## 2023-08-25 NOTE — Telephone Encounter (Signed)
 Spoke with pt regarding his decision to consider starting repatha. Pt is willing to further discuss this with a clinical pharmD. Referral placed and pt scheduled to see PharmD on 7/14. Pt has no further questions at this time.

## 2023-08-25 NOTE — Progress Notes (Signed)
 Subjective: Chief Complaint  Patient presents with   Nail Problem    Rm 12 Patient is here for routine foot care and nail trimming.   78 y.o. returns the office today for painful, elongated, thickened toenails which he cannot trim himself. No open lesions.   He was having increased sensitivity to his left foot.  No injuries that he reports. He has chronic swelling to his legs.  PCP: Neda Balk, MD -last seen 05/05/2023 Endocrinologist: Dr. Marianna Shirk, MD   Last A1c was 7.1 on 06/13/2023  Objective: AAO 3, NAD-presents with wife DP/PT pulses palpable, CRT less than 3 seconds Sensation is intact today  SWMF.  Nails hypertrophic, dystrophic, elongated, brittle, discolored 10. There is tenderness overlying the nails 1-5 bilaterally. There is no surrounding erythema or drainage along the nail sites. No open lesions. There is no area pinpoint tenderness identified today.  Chronic appearing lower extremity edema noted. No pain with calf compression, swelling, warmth, erythema.  Assessment: Patient presents with symptomatic onychomycosis; neuropathy  Plan: -Treatment options including alternatives, risks, complications were discussed -Nails sharply debrided 10 without complication/bleeding. - He was having some increase in size of his left foot.  Symptoms seem to be improving.  Will continue to monitor.  Discussed medications if needed. -Follow-up as scheduled or sooner if any problems are to arise. In the meantime, encouraged to call the office with any questions, concerns, changes symptoms.  Return in about 2 months (around 10/26/2023).  Charity Conch DPM

## 2023-09-06 ENCOUNTER — Other Ambulatory Visit (HOSPITAL_COMMUNITY): Payer: Self-pay

## 2023-09-08 ENCOUNTER — Encounter: Payer: Self-pay | Admitting: Cardiology

## 2023-09-15 DIAGNOSIS — M1712 Unilateral primary osteoarthritis, left knee: Secondary | ICD-10-CM | POA: Diagnosis not present

## 2023-09-15 DIAGNOSIS — M170 Bilateral primary osteoarthritis of knee: Secondary | ICD-10-CM | POA: Diagnosis not present

## 2023-09-26 ENCOUNTER — Ambulatory Visit: Admitting: Pharmacist

## 2023-10-25 ENCOUNTER — Ambulatory Visit: Admitting: Podiatry

## 2023-10-25 ENCOUNTER — Encounter: Payer: Self-pay | Admitting: Podiatry

## 2023-10-25 DIAGNOSIS — M79674 Pain in right toe(s): Secondary | ICD-10-CM | POA: Diagnosis not present

## 2023-10-25 DIAGNOSIS — M79675 Pain in left toe(s): Secondary | ICD-10-CM

## 2023-10-25 DIAGNOSIS — R21 Rash and other nonspecific skin eruption: Secondary | ICD-10-CM

## 2023-10-25 DIAGNOSIS — E1149 Type 2 diabetes mellitus with other diabetic neurological complication: Secondary | ICD-10-CM

## 2023-10-25 DIAGNOSIS — B351 Tinea unguium: Secondary | ICD-10-CM | POA: Diagnosis not present

## 2023-10-25 MED ORDER — CLOTRIMAZOLE-BETAMETHASONE 1-0.05 % EX CREA: 1.0000 | TOPICAL_CREAM | Freq: Every day | CUTANEOUS | 0 refills | Status: DC

## 2023-10-25 MED ORDER — CLOTRIMAZOLE-BETAMETHASONE 1-0.05 % EX CREA: 1.0000 | TOPICAL_CREAM | Freq: Two times a day (BID) | CUTANEOUS | 2 refills | Status: DC

## 2023-10-25 NOTE — Progress Notes (Signed)
 Subjective: Chief Complaint  Patient presents with   Diabetes    DFC IDDM A1C 7.1. Toenail trim. Pt. Has neuropathy. Sharp pain. 0 pain at the present.    78 y.o. returns the office today for painful, elongated, thickened toenails which he cannot trim himself. No open lesions.   He does use Lotrisone  cream on his skin which does help.  They are asking for a larger quantity prescription.  PCP: Domenica Harlene LABOR, MD -last seen 05/05/2023 Endocrinologist: Dr. Tawni Fendt, MD   Last A1c was 7.1 on 06/13/2023  Objective: AAO 3, NAD-presents with wife DP/PT pulses palpable, CRT less than 3 seconds Sensation is intact today  SWMF.  Nails hypertrophic, dystrophic, elongated, brittle, discolored 10. There is tenderness overlying the nails 1-5 bilaterally. There is no surrounding erythema or drainage along the nail sites. No open lesions. There is no area pinpoint tenderness identified today.  Chronic appearing lower extremity edema noted. No pain with calf compression, swelling, warmth, erythema.  Assessment: Patient presents with symptomatic onychomycosis; neuropathy  Plan: -Treatment options including alternatives, risks, complications were discussed -Nails sharply debrided 10 without complication/bleeding. - Refill Lotrisone .  Written prescription was written for the patient.  Utilize this twice a day. -Follow-up as scheduled or sooner if any problems are to arise. In the meantime, encouraged to call the office with any questions, concerns, changes symptoms.  No follow-ups on file.  Donnice JONELLE Fees DPM

## 2023-11-04 ENCOUNTER — Ambulatory Visit: Attending: Internal Medicine | Admitting: Pharmacist

## 2023-11-04 ENCOUNTER — Encounter: Payer: Self-pay | Admitting: Pharmacist

## 2023-11-04 DIAGNOSIS — E7849 Other hyperlipidemia: Secondary | ICD-10-CM

## 2023-11-04 NOTE — Progress Notes (Signed)
 Patient ID: Michael HERO Jobin Jr.                 DOB: [birth year]                    MRN: [id number]      HPI: Michael Facundo. is a 78 y.o. male patient referred to lipid clinic by Dr.Hochrein. PMH is significant for CAD, hypertension, DM2,obesity, HLD, unstable angina, T2DM   Patient's LDL is at goal on current lipid lowering therapy. However Lp(a) is elevated so patient was referred to Lipid clinic  Patient presented today with his wife for Lipid clinic. 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 We discussed last lipid lab and risk factors including Lp(a). Discussed non-statin LDL cholesterol lowering options, including PCSK-9 inhibitors, bempedoic acid and inclisiran.  Discussed mechanisms of action, dosing, side effects and potential decreases in LDL cholesterol.  Also reviewed cost information and potential options for patient assistance.   Patient reports his meds are covered through [name] and they do not cover Repatha just for elevated Lp(a). He is tolerating current lipid therapy well and his LDL is at goal so does not want to change the therapy  Current Medications: Simvastatin  40 mg daily and Zetia  10 mg daily  Intolerances: Lipitor - myalgia  Risk Factors: CAD, hypertension, DM2,obesity, HLD, unstable angina, T2DM, family hx of ASCVD.  LDL goal: <55 Last lab : 04/2023 TC 108,TG 89, HDL 38, LDLc 53  Lpa 197 04/2023   Diet:  Avoid fried food, use healthy oil, steamed or broiled vegetables   Exercise: cardio - 30 min   Family History:   Relation Problem Comments  Mother (Deceased at age 61) Cancer (Age: 1) ovarian  Hypertension     Father (Deceased at age 33) Heart attack (Age: 1)   Heart disease CHF    Sister (Alive) Cancer breast- just finished her last radiation    Paternal Uncle Heart attack (Age: 71) Sudden death    Maternal Grandmother (Deceased at age 71's?)   Maternal Grandfather (Deceased) Heart disease ?    Paternal Grandmother  (Deceased)   Paternal Grandfather (Deceased) Heart attack (Age: 33)     Daughter Metallurgist)   Son Metallurgist)     Social History:   Labs:  Lipid Panel     Component Value Date/Time   CHOL 133 10/28/2022 0802   TRIG 111.0 10/28/2022 0802   HDL 36.90 (L) 10/28/2022 0802   CHOLHDL 4 10/28/2022 0802   VLDL 22.2 10/28/2022 0802   LDLCALC 74 10/28/2022 0802   LDLCALC 98 11/21/2019 0827   LDLDIRECT 108.0 04/16/2014 1022    Past Medical History:  Diagnosis Date   CAD (coronary artery disease)    a. 05/2014 [place]  s/p DES x4 to mid-distLAD and DESx1 to DI   Complication of anesthesia    11'12 Surgery with excrutiating head about to explode upon awakening  -lasted several hours postop  ( SURGERY DONE 08-13-2011 AT [facility] PT DID OK POST OP)   Diverticulosis of colon    LEFT SIDE   Exposure to Agent [facility] 10/18/2016   [place]   History of small bowel obstruction    X2  28 YRS AGO AND LAST ONE 04/2014--  RESOLVED WITHOUT SURGICAL INTERVENTION   Hyperlipidemia    Hypertension    Knee pain, bilateral 08/13/2011   Left ureteral calculus    Multiple lipomas    hx. mulitple and some remains -arms, legs  Obesity, unspecified 09/18/2013   Obstructive sleep apnea 01/01/2016   Recurrent kidney stones 07/08/2013   Follows with Dr Chales He believes they are Calcium oxalate stones   Thrombocytopenia (HCC) 07/07/2014   Type 2 diabetes mellitus (HCC)    Vitamin D  deficiency 07/02/2014    Current Outpatient Medications on File Prior to Visit  Medication Sig Dispense Refill   Accu-Chek FastClix Lancets MISC Use to test blood sugar 2 times daily. Dx: E11.40 204 each 5   acetaminophen  (TYLENOL ) 500 MG tablet Take 1,000 mg by mouth every 6 (six) hours as needed for mild pain. Reported on 03/31/2015     aspirin  EC 81 MG tablet Take 81 mg by mouth daily with breakfast.      Cholecalciferol 50 MCG (2000 UT) TABS Take 2 tablets by mouth daily.     clotrimazole -betamethasone  (LOTRISONE ) cream Apply 1 Application  topically 2 (two) times daily. 60 g 2   clotrimazole -betamethasone  (LOTRISONE ) cream Apply 1 Application topically daily. 30 g 0   empagliflozin (JARDIANCE) 10 MG TABS tablet Take 10 mg by mouth daily.     ezetimibe  (ZETIA ) 10 MG tablet Take 1 tablet (10 mg total) by mouth daily. 90 tablet 3   glucagon  (GLUCAGON  EMERGENCY) 1 MG injection Inject 1 mg into the muscle once as needed for up to 1 dose. 1 each 12   glucose-Vitamin C 4-0.006 GM CHEW chewable tablet CHEW FOUR TABLETS BY MOUTH  AS NEEDED (REPEAT EVERY 15 MINUTES IF BLOOD SUGAR LESS THAN 70) (Patient not taking: Reported on 10/25/2023)     insulin  aspart (NOVOLOG ) 100 UNIT/ML injection Inject 15 Units into the skin 3 (three) times daily before meals. Inject in skin before meals     insulin  glargine (LANTUS ) 100 UNIT/ML injection Inject 40-50 Units into the skin daily.     ipratropium (ATROVENT) 0.03 % nasal spray Place 2 sprays into the nose 3 (three) times daily.     losartan  (COZAAR ) 100 MG tablet Take 1 tablet (100 mg total) by mouth daily. (Patient taking differently: Take 75 mg by mouth daily.) 90 tablet 0   metoprolol  tartrate (LOPRESSOR ) 25 MG tablet Take 1 tablet (25 mg total) by mouth 2 (two) times daily. 180 tablet 3   Multiple Vitamin (MULTIVITAMIN WITH MINERALS) TABS tablet Take 1 tablet by mouth daily.     nitroGLYCERIN  (NITROSTAT ) 0.4 MG SL tablet Place 1 tablet (0.4 mg total) under the tongue every 5 (five) minutes as needed for chest pain. 25 tablet 3   prasugrel  (EFFIENT ) 10 MG TABS tablet Take 1 tablet (10 mg total) by mouth daily. 90 tablet 3   simvastatin  (ZOCOR ) 40 MG tablet TAKE 1 TABLET (40 MG) BY MOUTH AT BEDTIME. 90 tablet 2   tiZANidine  (ZANAFLEX ) 2 MG tablet Take 1 tablet (2 mg total) by mouth at bedtime. (Patient not taking: Reported on 10/25/2023) 10 tablet 0   [DISCONTINUED] famotidine  (PEPCID ) 20 MG tablet Take 1 tablet (20 mg total) by mouth at bedtime. (Patient not taking: Reported on 04/12/2023) 30 tablet 3    No current facility-administered medications on file prior to visit.    Allergies  Allergen Reactions   Ciprofloxacin Other (See Comments)    Abdominal pain   Lipitor [Atorvastatin Calcium] Other (See Comments)    Severe muscle pain   Lisinopril Cough   Metformin And Related Diarrhea    Assessment/Plan:  1. Hyperlipidemia -  Problem  Hyperlipidemia   Current Medications: Simvastatin  40 mg daily and Zetia  10 mg daily  Intolerances: Lipitor - myalgia  Risk Factors: CAD, hypertension, DM2,obesity, HLD, unstable angina, T2DM, family hx of ASCVD.  LDL goal: <55 Last lab : 04/2023 TC 108,TG 89, HDL 38, LDLc 53  Lpa 197 04/2023     Hyperlipidemia Assessment and Plan:  The patient has a target LDL goal of <55 mg/dL and triglyceride goal of <150 mg/dL. His most recent lipid panel shows total cholesterol 108 mg/dL, triglycerides 89 mg/dL, HDL 38 mg/dL, and LDL 53 mg/dL while on simvastatin  40 mg daily and ezetimibe  (Zetia ) 10 mg daily. We discussed non-statin LDL-lowering options including PCSK9 inhibitors (e.g., Repatha), bempedoic acid, and inclisiran, reviewing their mechanisms of action, dosing schedules, side effect profiles, and potential LDL-lowering effects. Cost and coverage considerations were also addressed, along with available patient assistance programs. The patient noted that his medications are covered through the [name], which does not approve Repatha for elevated Lp(a) alone. He is tolerating his current regimen well and, given that his LDL is at goal, he prefers to continue with his current therapy without changes at this time.    Thank you,  Robbi Blanch, Pharm.D Michael Cunningham. [facility] [address], [place], [place] [address] Phone: [phone]; Fax: [phone]

## 2023-11-04 NOTE — Patient Instructions (Signed)
 SABRA

## 2023-11-04 NOTE — Assessment & Plan Note (Signed)
 Assessment and Plan:  The patient has a target LDL goal of <55 mg/dL and triglyceride goal of <150 mg/dL. His most recent lipid panel shows total cholesterol 108 mg/dL, triglycerides 89 mg/dL, HDL 38 mg/dL, and LDL 53 mg/dL while on simvastatin  40 mg daily and ezetimibe  (Zetia ) 10 mg daily. We discussed non-statin LDL-lowering options including PCSK9 inhibitors (e.g., Repatha), bempedoic acid, and inclisiran, reviewing their mechanisms of action, dosing schedules, side effect profiles, and potential LDL-lowering effects. Cost and coverage considerations were also addressed, along with available patient assistance programs. The patient noted that his medications are covered through the [name], which does not approve Repatha for elevated Lp(a) alone. He is tolerating his current regimen well and, given that his LDL is at goal, he prefers to continue with his current therapy without changes at this time.

## 2023-11-17 ENCOUNTER — Ambulatory Visit: Payer: Medicare HMO | Admitting: Family Medicine

## 2023-11-21 ENCOUNTER — Encounter: Payer: Self-pay | Admitting: Family Medicine

## 2023-11-28 LAB — BASIC METABOLIC PANEL WITH GFR
BUN: 15 (ref 4–21)
CO2: 28 — AB (ref 13–22)
Chloride: 103 (ref 99–108)
Creatinine: 0.9 (ref 0.6–1.3)
Glucose: 100
Potassium: 4.7 meq/L (ref 3.5–5.1)
Sodium: 139 (ref 137–147)

## 2023-11-28 LAB — CBC AND DIFFERENTIAL
HCT: 46 (ref 41–53)
Hemoglobin: 16.1 (ref 13.5–17.5)
Platelets: 160 K/uL (ref 150–400)
WBC: 7.2

## 2023-11-28 LAB — HEPATIC FUNCTION PANEL
ALT: 25 U/L (ref 10–40)
AST: 27 (ref 14–40)
Alkaline Phosphatase: 88 (ref 25–125)
Bilirubin, Direct: 0.3 (ref 0.01–0.4)
Bilirubin, Total: 1

## 2023-11-28 LAB — CBC: RBC: 4.96 (ref 3.87–5.11)

## 2023-11-28 LAB — HEMOGLOBIN A1C: Hemoglobin A1C: 7

## 2023-11-28 LAB — COMPREHENSIVE METABOLIC PANEL WITH GFR
Calcium: 9.5 (ref 8.7–10.7)
eGFR: 86

## 2023-12-06 ENCOUNTER — Encounter: Payer: Self-pay | Admitting: Family Medicine

## 2023-12-06 ENCOUNTER — Ambulatory Visit

## 2023-12-06 ENCOUNTER — Ambulatory Visit (INDEPENDENT_AMBULATORY_CARE_PROVIDER_SITE_OTHER): Admitting: *Deleted

## 2023-12-06 VITALS — Ht 70.0 in | Wt 238.0 lb

## 2023-12-06 DIAGNOSIS — Z0000 Encounter for general adult medical examination without abnormal findings: Secondary | ICD-10-CM

## 2023-12-06 NOTE — Progress Notes (Signed)
 Please attest this visit in the absence of patient primary care provider.    Subjective:   Michael Cunningham. is a 78 y.o. who presents for a Medicare Wellness preventive visit.  As a reminder, Annual Wellness Visits don't include a physical exam, and some assessments may be limited, especially if this visit is performed virtually. We may recommend an in-person follow-up visit with your provider if needed.  Visit Complete: Virtual I connected with  Michael Cunningham. on 12/06/23 by a audio enabled telemedicine application and verified that I am speaking with the correct person using two identifiers.  Patient Location: Home  Provider Location: Office/Clinic  I discussed the limitations of evaluation and management by telemedicine. The patient expressed understanding and agreed to proceed.  Vital Signs: Because this visit was a virtual/telehealth visit, some criteria may be missing or patient reported. Any vitals not documented were not able to be obtained and vitals that have been documented are patient reported.  VideoDeclined- This patient declined Librarian, academic. Therefore the visit was completed with audio only.  Persons Participating in Visit: Patient.  AWV Questionnaire: No: Patient Medicare AWV questionnaire was not completed prior to this visit.  Cardiac Risk Factors include: advanced age (>48men, >32 women);dyslipidemia;diabetes mellitus;hypertension;male gender;Other (see comment), Risk factor comments: CAD     Objective:    Today's Vitals   12/06/23 0902  Weight: 238 lb (108 kg)  Height: 5' 10 (1.778 m)   Body mass index is 34.15 kg/m.     12/06/2023    9:27 AM 02/10/2022    1:42 PM 10/07/2021    3:54 PM 02/10/2021    3:31 PM 10/04/2019    1:12 PM 09/26/2018   10:17 AM 09/20/2017   10:13 AM  Advanced Directives  Does Patient Have a Medical Advance Directive? Yes Yes No Yes Yes Yes Yes   Type of Theatre stage manager of [place];Living will  Healthcare Power of [place];Living will Healthcare Power of [place];Living will Healthcare Power of [place];Living will Healthcare Power of [place];Living will  Does patient want to make changes to medical advance directive? No - Patient declined No - Patient declined   No - Patient declined No - Patient declined  No - Patient declined   Copy of Healthcare Power of Attorney in Chart? Yes - validated most recent copy scanned in chart (See row information) Yes - validated most recent copy scanned in chart (See row information)  Yes - validated most recent copy scanned in chart (See row information) Yes - validated most recent copy scanned in chart (See row information) Yes - validated most recent copy scanned in chart (See row information)  Yes      Data saved with a previous flowsheet row definition    Current Medications (verified) Outpatient Encounter Medications as of 12/06/2023  Medication Sig   Accu-Chek FastClix Lancets MISC Use to test blood sugar 2 times daily. Dx: E11.40   acetaminophen  (TYLENOL ) 500 MG tablet Take 1,000 mg by mouth every 6 (six) hours as needed for mild pain. Reported on 03/31/2015   aspirin  EC 81 MG tablet Take 81 mg by mouth daily with breakfast.    Cholecalciferol 50 MCG (2000 UT) TABS Take 2 tablets by mouth daily. 9/23 [facility] when completes [facility].   clotrimazole -betamethasone  (LOTRISONE ) cream Apply 1 Application topically 2 (two) times daily. (Patient taking differently: Apply 1 Application topically 2 (two) times daily as needed.)  Continuous Glucose Sensor (FREESTYLE LIBRE 3 SENSOR) MISC 1 each by Does not apply route. Every 15 days   empagliflozin (JARDIANCE) 10 MG TABS tablet Take 10 mg by mouth daily.   ezetimibe  (ZETIA ) 10 MG tablet Take 1 tablet (10 mg total) by mouth daily.   glucagon  (GLUCAGON  EMERGENCY) 1 MG injection Inject 1 mg into the muscle once as needed for up to 1 dose.    insulin  aspart (NOVOLOG ) 100 UNIT/ML injection Inject 15 Units into the skin 3 (three) times daily before meals. Inject in skin before meals (Patient taking differently: Inject 15 Units into the skin 3 (three) times daily before meals. Inject in skin before meals and on a sliding scale for elevated BS)   insulin  glargine (LANTUS ) 100 UNIT/ML injection Inject 40-50 Units into the skin daily.   ipratropium (ATROVENT) 0.03 % nasal spray Place 2 sprays into the nose 3 (three) times daily.   losartan  (COZAAR ) 100 MG tablet Take 1 tablet (100 mg total) by mouth daily. (Patient taking differently: Take 75 mg by mouth daily.)   metoprolol  tartrate (LOPRESSOR ) 25 MG tablet Take 1 tablet (25 mg total) by mouth 2 (two) times daily.   Multiple Vitamin (MULTIVITAMIN WITH MINERALS) TABS tablet Take 1 tablet by mouth daily.   nitroGLYCERIN  (NITROSTAT ) 0.4 MG SL tablet Place 1 tablet (0.4 mg total) under the tongue every 5 (five) minutes as needed for chest pain.   prasugrel  (EFFIENT ) 10 MG TABS tablet Take 1 tablet (10 mg total) by mouth daily.   PRESCRIPTION MEDICATION Apply 1 Application topically 2 (two) times daily as needed. MENTHOL /METHYL SALICYLATE (10-15%) LOW CONC. CREAM,TOP (for bursitis)   simvastatin  (ZOCOR ) 40 MG tablet TAKE 1 TABLET (40 MG) BY MOUTH AT BEDTIME.   [DISCONTINUED] clotrimazole -betamethasone  (LOTRISONE ) cream Apply 1 Application topically daily.   [DISCONTINUED] famotidine  (PEPCID ) 20 MG tablet Take 1 tablet (20 mg total) by mouth at bedtime. (Patient not taking: Reported on 04/12/2023)   [DISCONTINUED] glucose-Vitamin C 4-0.006 GM CHEW chewable tablet CHEW FOUR TABLETS BY MOUTH  AS NEEDED (REPEAT EVERY 15 MINUTES IF BLOOD SUGAR LESS THAN 70) (Patient not taking: Reported on 12/06/2023)   [DISCONTINUED] tiZANidine  (ZANAFLEX ) 2 MG tablet Take 1 tablet (2 mg total) by mouth at bedtime. (Patient not taking: Reported on 10/25/2023)   No facility-administered encounter medications on file as of  12/06/2023.    Allergies (verified) Ciprofloxacin, Lipitor [atorvastatin calcium], Lisinopril, and Metformin and related   History: Past Medical History:  Diagnosis Date   CAD (coronary artery disease)    a. 05/2014 [place]  s/p DES x4 to mid-distLAD and DESx1 to DI   Complication of anesthesia    11'12 Surgery with excrutiating head about to explode upon awakening  -lasted several hours postop  ( SURGERY DONE 08-13-2011 AT [facility] PT DID OK POST OP)   Diverticulosis of colon    LEFT SIDE   Exposure to Agent [facility] 10/18/2016   [place]   History of small bowel obstruction    X2  28 YRS AGO AND LAST ONE 04/2014--  RESOLVED WITHOUT SURGICAL INTERVENTION   Hyperlipidemia    Hypertension    Knee pain, bilateral 08/13/2011   Left ureteral calculus    Multiple lipomas    hx. mulitple and some remains -arms, legs   Obesity, unspecified 09/18/2013   Obstructive sleep apnea 01/01/2016   Recurrent kidney stones 07/08/2013   Follows with Dr Chales He believes they are Calcium oxalate stones   Thrombocytopenia 07/07/2014   Type 2 diabetes mellitus (  HCC)    Vitamin D  deficiency 07/02/2014   Past Surgical History:  Procedure Laterality Date   COLONOSCOPY W/ POLYPECTOMY  LAST ONE 2013   CORONARY STENT PLACEMENT  05/17/2014   PROXIMAL X 4  MID & DISTAL LAD  DIAGONAL    KNEE ARTHROSCOPY  08/13/2011   right ARTHROSCOPY KNEE;  Surgeon: Dempsey LULLA Moan, MD;  Location: [facility];  Service: Orthopedics;  Laterality: Right;  medial  meniscal debridement, excision of plica, synovectomy   KNEE ARTHROSCOPY Left 11/16/2013   Procedure: LEFT KNEE ARTHROSCOPY MEDIAL MENISCAL DEBRIDEMENT, CHONDROPLASTY;  Surgeon: Dempsey Moan LULLA, MD;  Location: [facility];  Service: Orthopedics;  Laterality: Left;   KNEE ARTHROSCOPY W/ MENISCECTOMY Right 01-29-2011   twice, first trimmed mediscus and cyst   LEFT HEART CATHETERIZATION WITH CORONARY ANGIOGRAM N/A 05/17/2014   Procedure: LEFT HEART CATHETERIZATION WITH CORONARY ANGIOGRAM;   Surgeon: Lonni JONETTA Cash, MD;  Location: [facility] CATH LAB;  Service: Cardiovascular;  Laterality: N/A;   LIPOMA EXCISION  1975   multiple, 10-12   PERCUTANEOUS CORONARY STENT INTERVENTION (PCI-S)  05/17/2014   Procedure: PERCUTANEOUS CORONARY STENT INTERVENTION (PCI-S);  Surgeon: Lonni JONETTA Cash, MD;  Location: [facility] CATH LAB;  Service: Cardiovascular;;  Diag1 and Prox to Distal LAD   Family History  Problem Relation Age of Onset   Hypertension Mother    Cancer Mother 34       ovarian   Heart disease Father        CHF   Heart attack Father 94   Cancer Sister        breast- just finished her last radiation   Heart disease Maternal Grandfather        ?   Heart attack Paternal Grandfather 21   Heart attack Paternal Uncle 82       Sudden death   Diabetes Neg Hx    Social History   Socioeconomic History   Marital status: Married    Spouse name: Not on file   Number of children: 2   Years of education: Not on file   Highest education level: Not on file  Occupational History   Not on file  Tobacco Use   Smoking status: Former    Current packs/day: 0.00    Average packs/day: 1 pack/day for 4.0 years (4.0 ttl pk-yrs)    Types: Cigarettes    Start date: 06/14/1963    Quit date: 06/14/1967    Years since quitting: 56.5   Smokeless tobacco: Never  Substance and Sexual Activity   Alcohol use: No    Alcohol/week: 0.0 standard drinks of alcohol   Drug use: No   Sexual activity: Yes    Comment: lives with wife  Other Topics Concern   Not on file  Social History Narrative   Retired International aid/development worker improvement.  Lives with wife.     Social Drivers of Corporate investment banker Strain: Low Risk  (12/06/2023)   Overall Financial Resource Strain (CARDIA)    Difficulty of Paying Living Expenses: Not hard at all  Food Insecurity: No Food Insecurity (12/06/2023)   Hunger Vital Sign    Worried About Running Out of Food in the Last Year: Never true    Ran Out of Food in the Last Year:  Never true  Transportation Needs: No Transportation Needs (12/06/2023)   PRAPARE - Administrator, Civil Service (Medical): No    Lack of Transportation (Non-Medical): No  Physical Activity: Sufficiently Active (12/06/2023)   Exercise Vital  Sign    Days of Exercise per Week: 5 days    Minutes of Exercise per Session: 30 min  Stress: No Stress Concern Present (12/06/2023)   [employer] of Occupational Health - Occupational Stress Questionnaire    Feeling of Stress: Not at all  Social Connections: Socially Integrated (12/06/2023)   Social Connection and Isolation Panel    Frequency of Communication with Friends and Family: More than three times a week    Frequency of Social Gatherings with Friends and Family: More than three times a week    Attends Religious Services: More than 4 times per year    Active Member of [employer] or Organizations: Yes    Attends Engineer, structural: More than 4 times per year    Marital Status: Married    Tobacco Counseling Counseling given: Not Answered    Clinical Intake:  Pre-visit preparation completed: Yes  Pain : No/denies pain     BMI - recorded: 34.15 Nutritional Status: BMI > 30  Obese Nutritional Risks: None Diabetes: Yes CBG done?: No Did pt. bring in CBG monitor from home?: No  Lab Results  Component Value Date   HGBA1C 7.1 06/13/2023   HGBA1C 6.7 (H) 10/28/2022   HGBA1C 7.2 (H) 07/26/2022     How often do you need to have someone help you when you read instructions, pamphlets, or other written materials from your doctor or pharmacy?: 1 - Never  Interpreter Needed?: No  Information entered by :: Lolita Libra, CMA(AAMA)   Activities of Daily Living     12/06/2023    9:20 AM  In your present state of health, do you have any difficulty performing the following activities:  Hearing? 1  Vision? 0  Difficulty concentrating or making decisions? 0  Walking or climbing stairs? 0  Dressing or bathing? 0   Doing errands, shopping? 0  Preparing Food and eating ? N  Using the Toilet? N  In the past six months, have you accidently leaked urine? N  Do you have problems with loss of bowel control? N  Managing your Medications? N  Managing your Finances? N  Housekeeping or managing your Housekeeping? N    Patient Care Team: Domenica Harlene LABOR, MD as PCP - General (Family Medicine) Lavona Agent, MD as PCP - Cardiology (Cardiology) Lavona Agent, MD as Consulting Physician (Cardiology) Trixie File, MD as Consulting Physician (Endocrinology) Melodi Lerner, MD as Consulting Physician (Orthopedic Surgery) Burnard Debby LABOR, MD (Inactive) as Consulting Physician (Cardiology) Leslee Reusing, MD as Consulting Physician (Ophthalmology) Rosalie Kitchens, MD as Consulting Physician (Gastroenterology) [place], Tawni CROME, MD as Consulting Physician (Dermatology) Gershon Donnice SAUNDERS, DPM as Consulting Physician (Podiatry) Camella Fallow, MD as Consulting Physician (Orthopedic Surgery) Zora College (Endocrinology)  I have updated your Care Teams any recent Medical Services you may have received from other providers in the past year.     Assessment:   This is a routine wellness examination for Marlen.  Hearing/Vision screen Hearing Screening - Comments:: Has hearing aids Vision Screening - Comments:: Up to date with routine eye exams with Reusing Leslee    Goals Addressed               This Visit's Progress     Patient Stated (pt-stated)        To use stationary bike 5 days a week       Depression Screen     12/06/2023    9:25 AM 05/05/2023   11:18 AM 11/04/2022  11:06 AM 08/02/2022   10:46 AM 02/10/2022    1:50 PM 02/01/2022    9:17 AM 10/01/2021   11:20 AM  PHQ 2/9 Scores  PHQ - 2 Score 0 0 0 0 0 0 0  PHQ- 9 Score 0   0       Fall Risk     12/06/2023    9:13 AM 05/05/2023   11:17 AM 11/04/2022   11:05 AM 08/02/2022   10:46 AM 02/10/2022    1:48 PM  Fall Risk    Falls in the past year? 0 0 0 0 0  Number falls in past yr: 0 0 0 0 0  Injury with Fall? 0 0 0 0 0  Risk for fall due to : No Fall Risks No Fall Risks   No Fall Risks  Follow up Education provided Falls evaluation completed Falls evaluation completed Falls evaluation completed Falls evaluation completed      Data saved with a previous flowsheet row definition    MEDICARE RISK AT HOME:  Medicare Risk at Home Any stairs in or around the home?: Yes If so, are there any without handrails?: No Home free of loose throw rugs in walkways, pet beds, electrical cords, etc?: Yes Adequate lighting in your home to reduce risk of falls?: Yes Life alert?: No Use of a cane, walker or w/c?: Yes (rarely. recently needed for bursitis) Grab bars in the bathroom?: Yes Shower chair or bench in shower?: Yes Elevated toilet seat or a handicapped toilet?: Yes  TIMED UP AND GO:  Was the test performed?  No,audio  Cognitive Function: 6CIT completed    09/16/2016   10:04 AM  MMSE - Mini Mental State Exam  Orientation to time 5   Orientation to Place 5   Registration 3   Attention/ Calculation 5   Recall 3   Language- name 2 objects 2   Language- repeat 1  Language- follow 3 step command 3   Language- read & follow direction 1   Write a sentence 1   Copy design 1   Total score 30      Data saved with a previous flowsheet row definition        12/06/2023    9:27 AM 02/10/2022    1:56 PM  6CIT Screen  What Year? 0 points 0 points  What month? 0 points 0 points  What time? 0 points 0 points  Count back from 20 0 points 0 points  Months in reverse 0 points 0 points  Repeat phrase 0 points 0 points  Total Score 0 points 0 points    Immunizations Immunization History  Administered Date(s) Administered   Fluad  Quad(high Dose 65+) 12/14/2019, 12/29/2020, 12/18/2021   Fluad  Trivalent(High Dose 65+) 12/31/2022   Hepatitis A 10/18/2003   Hepatitis B 10/18/2003, 11/29/2003   INFLUENZA, HIGH  DOSE SEASONAL PF 12/19/2015, 12/28/2016, 12/12/2018   Influenza Split 12/14/2019   Influenza,inj,Quad PF,6+ Mos 01/10/2014, 12/26/2014   Influenza,inj,quad, With Preservative 12/28/2016   Influenza-Unspecified 11/19/2010, 12/03/2011, 12/12/2012   Mumps 01/29/2004   PFIZER Comirnaty (Gray Top)Covid-19 Tri-Sucrose Vaccine 07/11/2020   PFIZER(Purple Top)SARS-COV-2 Vaccination 04/19/2019, 05/14/2019, 01/12/2020, 01/12/2022   Pfizer Covid-19 Vaccine Bivalent Booster 45yrs & up 01/14/2021, 12/18/2021   Pfizer(Comirnaty )Fall Seasonal Vaccine 12 years and older 01/25/2022, 01/20/2023   Pneumococcal Conjugate-13 01/10/2014, 03/16/2015, 06/24/2021   Pneumococcal Polysaccharide-23 10/18/2003, 03/16/2015, 03/31/2015   Td 05/13/1997, 05/21/2009   Tdap 12/18/2015   Zoster Recombinant(Shingrix) 05/17/2017, 09/07/2017   Zoster, Live 06/06/2009  Screening Tests Health Maintenance  Topic Date Due   Medicare Annual Wellness (AWV)  02/11/2023   Influenza Vaccine  10/14/2023   COVID-19 Vaccine (8 - Pfizer risk 2024-25 season) 11/14/2023   HEMOGLOBIN A1C  12/13/2023   OPHTHALMOLOGY EXAM  04/19/2024   FOOT EXAM  05/23/2024   Diabetic kidney evaluation - eGFR measurement  06/12/2024   Diabetic kidney evaluation - Urine ACR  06/12/2024   DTaP/Tdap/Td (4 - Td or Tdap) 12/17/2025   Pneumococcal Vaccine: 50+ Years  Completed   Hepatitis C Screening  Completed   Zoster Vaccines- Shingrix  Completed   HPV VACCINES  Aged Out   Meningococcal B Vaccine  Aged Out   Hepatitis B Vaccines 19-59 Average Risk  Discontinued   Colonoscopy  Discontinued    Health Maintenance Items Addressed: Will get flu vaccine in October. Will get Covid vaccine at pharmacy.   Additional Screening:  Vision Screening: Recommended annual ophthalmology exams for early detection of glaucoma and other disorders of the eye. Is the patient up to date with their annual eye exam?  Yes  Who is the provider or what is the name of the  office in which the patient attends annual eye exams? [facility]  Dental Screening: Recommended annual dental exams for proper oral hygiene  Community Resource Referral / Chronic Care Management: CRR required this visit?  No   CCM required this visit?  No   Plan:    I have personally reviewed and noted the following in the patient's chart:   Medical and social history Use of alcohol, tobacco or illicit drugs  Current medications and supplements including opioid prescriptions. Patient is not currently taking opioid prescriptions. Functional ability and status Nutritional status Physical activity Advanced directives List of other physicians Hospitalizations, surgeries, and ER visits in previous 12 months Vitals Screenings to include cognitive, depression, and falls Referrals and appointments  In addition, I have reviewed and discussed with patient certain preventive protocols, quality metrics, and best practice recommendations. A written personalized care plan for preventive services as well as general preventive health recommendations were provided to patient.   Lolita Libra, CMA   12/06/2023   After Visit Summary: (MyChart) Due to this being a telephonic visit, the after visit summary with patients personalized plan was offered to patient via MyChart   Notes: Nothing significant to report at this time.

## 2023-12-06 NOTE — Patient Instructions (Signed)
 [name] , Thank you for taking time out of your busy schedule to complete your Annual Wellness Visit with me. I enjoyed our conversation and look forward to speaking with you again next year. I, as well as your care team,  appreciate your ongoing commitment to your health goals. Please review the following plan we discussed and let me know if I can assist you in the future. Your Game plan/ To Do List   Goal:  To use stationary bike 5 days a week  Follow up Visits: Next Medicare AWV with our clinical staff: 12/12/24 9:40am, telephone    Next Office Visit with your provider: 03/22/24 9:40am, Dr Domenica  Clinician Recommendations:  Aim for 30 minutes of exercise or brisk walking, 6-8 glasses of water, and 5 servings of fruits and vegetables each day.   You will need to get the following vaccines at your local pharmacy: Flu and Covid   This is a list of the screening recommended for you and due dates:  Health Maintenance  Topic Date Due   Medicare Annual Wellness Visit  02/11/2023   Flu Shot  10/14/2023   COVID-19 Vaccine (8 - Pfizer risk 2024-25 season) 11/14/2023   Hemoglobin A1C  12/13/2023   Eye exam for diabetics  04/19/2024   Complete foot exam   05/23/2024   Yearly kidney function blood test for diabetes  06/12/2024   Yearly kidney health urinalysis for diabetes  06/12/2024   DTaP/Tdap/Td vaccine (4 - Td or Tdap) 12/17/2025   Pneumococcal Vaccine for age over 67  Completed   Hepatitis C Screening  Completed   Zoster (Shingles) Vaccine  Completed   HPV Vaccine  Aged Out   Meningitis B Vaccine  Aged Out   Hepatitis B Vaccine  Discontinued   Colon Cancer Screening  Discontinued    Advanced directives: (In Chart) A copy of your advanced directives are scanned into your chart should your provider ever need it. Advance Care Planning is important because it:  [x]  Makes sure you receive the medical care that is consistent with your values, goals, and preferences  [x]  It provides  guidance to your family and loved ones and reduces their decisional burden about whether or not they are making the right decisions based on your wishes.  Follow the link provided in your after visit summary or read over the paperwork we have mailed to you to help you started getting your Advance Directives in place. If you need assistance in completing these, please reach out to us  so that we can help you!  See attachments for Preventive Care and Fall Prevention Tips.

## 2023-12-08 ENCOUNTER — Encounter: Payer: Self-pay | Admitting: Cardiology

## 2023-12-08 ENCOUNTER — Encounter: Payer: Self-pay | Admitting: Family Medicine

## 2023-12-09 ENCOUNTER — Encounter: Payer: Self-pay | Admitting: *Deleted

## 2024-01-03 ENCOUNTER — Ambulatory Visit (INDEPENDENT_AMBULATORY_CARE_PROVIDER_SITE_OTHER): Admitting: Podiatry

## 2024-01-03 ENCOUNTER — Encounter: Payer: Self-pay | Admitting: Podiatry

## 2024-01-03 DIAGNOSIS — E1149 Type 2 diabetes mellitus with other diabetic neurological complication: Secondary | ICD-10-CM

## 2024-01-03 DIAGNOSIS — M79675 Pain in left toe(s): Secondary | ICD-10-CM

## 2024-01-03 DIAGNOSIS — M79674 Pain in right toe(s): Secondary | ICD-10-CM

## 2024-01-03 DIAGNOSIS — B351 Tinea unguium: Secondary | ICD-10-CM | POA: Diagnosis not present

## 2024-01-03 NOTE — Progress Notes (Unsigned)
 Subjective: Chief Complaint  Patient presents with   Diabetes    DFC IDDM A1C 7.1. Toenail trim. Pt. Has neuropathy. Sharp pain. 0 pain at the present.    78 y.o. returns the office today for painful, elongated, thickened toenails which he cannot trim himself. No open lesions.   He does use Lotrisone  cream on his skin which does help.  They are asking for a larger quantity prescription.  PCP: Domenica Harlene LABOR, MD -last seen 05/05/2023 Endocrinologist: Dr. Tawni Fendt, MD   Last A1c was 7.1 on 06/13/2023  Objective: AAO 3, NAD-presents with wife DP/PT pulses palpable, CRT less than 3 seconds Sensation is intact today  SWMF.  Nails hypertrophic, dystrophic, elongated, brittle, discolored 10. There is tenderness overlying the nails 1-5 bilaterally. There is no surrounding erythema or drainage along the nail sites. No open lesions. There is no area pinpoint tenderness identified today.  Chronic appearing lower extremity edema noted. No pain with calf compression, swelling, warmth, erythema.  Assessment: Patient presents with symptomatic onychomycosis; neuropathy  Plan: -Treatment options including alternatives, risks, complications were discussed -Nails sharply debrided 10 without complication/bleeding. - Refill Lotrisone .  Written prescription was written for the patient.  Utilize this twice a day. -Follow-up as scheduled or sooner if any problems are to arise. In the meantime, encouraged to call the office with any questions, concerns, changes symptoms.  No follow-ups on file.  Donnice JONELLE Fees DPM

## 2024-02-03 ENCOUNTER — Other Ambulatory Visit (HOSPITAL_BASED_OUTPATIENT_CLINIC_OR_DEPARTMENT_OTHER): Payer: Self-pay

## 2024-02-03 MED ORDER — COMIRNATY 30 MCG/0.3ML IM SUSY
0.3000 mL | PREFILLED_SYRINGE | Freq: Once | INTRAMUSCULAR | 0 refills | Status: AC
  Filled 2024-02-03: qty 0.3, 1d supply, fill #0

## 2024-02-13 ENCOUNTER — Other Ambulatory Visit (HOSPITAL_BASED_OUTPATIENT_CLINIC_OR_DEPARTMENT_OTHER): Payer: Self-pay

## 2024-02-14 ENCOUNTER — Other Ambulatory Visit: Payer: Self-pay

## 2024-03-05 ENCOUNTER — Ambulatory Visit: Admitting: Podiatry

## 2024-03-18 NOTE — Progress Notes (Signed)
 "  Subjective:    Patient ID: Michael CHRISTELLA Jewell Mickey., male    DOB: 03/08/1946, 79 y.o.   MRN: [id number]  Chief Complaint  Patient presents with   Medical Management of Chronic Issues    Patient presents today for a 11 month follow-up.    HPI Discussed the use of AI scribe software for clinical note transcription with the patient, who gave verbal consent to proceed.  History of Present Illness Michael Laymon. is a 79 year old male with multiple chronic medical concerns including diabetes. He continues to get care at [facility] as well as our clinic.   He is managing diabetes and has a history of high LPA levels. He discussed Repatha as a possible option to reduce LPA levels by 25%, but he is hesitant due to cost and lack of [facility] coverage. He is currently on simvastatin  for cholesterol management.  He experiences frequent bruising, particularly on his hands, but denies any serious bleeding issues such as bleeding gums or blood in the stool. He attributes some of the bruising to aging, as he is turning 79 in April.  His wife manages his foot care, including treatment for a fungal infection between his toes with clotrimazole  and betamethasone  cream. He has implemented a regimen of cleaning, drying, and applying the cream, which has shown improvement.    Past Medical History:  Diagnosis Date   CAD (coronary artery disease)    a. 05/2014 [place]  s/p DES x4 to mid-distLAD and DESx1 to DI   Complication of anesthesia    11'12 Surgery with excrutiating head about to explode upon awakening  -lasted several hours postop  ( SURGERY DONE 08-13-2011 AT [facility] PT DID OK POST OP)   Diverticulosis of colon    LEFT SIDE   Exposure to Agent [facility] 10/18/2016   [place]   History of small bowel obstruction    X2  28 YRS AGO AND LAST ONE 04/2014--  RESOLVED WITHOUT SURGICAL INTERVENTION   Hyperlipidemia    Hypertension    Knee pain, bilateral 08/13/2011   Left ureteral calculus    Multiple lipomas    hx. mulitple and  some remains -arms, legs   Obesity, unspecified 09/18/2013   Obstructive sleep apnea 01/01/2016   Recurrent kidney stones 07/08/2013   Follows with Dr Chales He believes they are Calcium oxalate stones   Thrombocytopenia 07/07/2014   Type 2 diabetes mellitus (HCC)    Vitamin D  deficiency 07/02/2014    Past Surgical History:  Procedure Laterality Date   COLONOSCOPY W/ POLYPECTOMY  LAST ONE 2013   CORONARY STENT PLACEMENT  05/17/2014   PROXIMAL X 4  MID & DISTAL LAD  DIAGONAL    KNEE ARTHROSCOPY  08/13/2011   right ARTHROSCOPY KNEE;  Surgeon: Dempsey LULLA Moan, MD;  Location: [facility] ORS;  Service: Orthopedics;  Laterality: Right;  medial  meniscal debridement, excision of plica, synovectomy   KNEE ARTHROSCOPY Left 11/16/2013   Procedure: LEFT KNEE ARTHROSCOPY MEDIAL MENISCAL DEBRIDEMENT, CHONDROPLASTY;  Surgeon: Dempsey Moan LULLA, MD;  Location: [facility] ORS;  Service: Orthopedics;  Laterality: Left;   KNEE ARTHROSCOPY W/ MENISCECTOMY Right 01-29-2011   twice, first trimmed mediscus and cyst   LEFT HEART CATHETERIZATION WITH CORONARY ANGIOGRAM N/A 05/17/2014   Procedure: LEFT HEART CATHETERIZATION WITH CORONARY ANGIOGRAM;  Surgeon: Lonni JONETTA Cash, MD;  Location: [facility] CATH LAB;  Service: Cardiovascular;  Laterality: N/A;   LIPOMA EXCISION  1975   multiple, 10-12   PERCUTANEOUS CORONARY STENT INTERVENTION (PCI-S)  05/17/2014  Procedure: PERCUTANEOUS CORONARY STENT INTERVENTION (PCI-S);  Surgeon: Lonni JONETTA Cash, MD;  Location: [facility] CATH LAB;  Service: Cardiovascular;;  Diag1 and Prox to Distal LAD    Family History  Problem Relation Age of Onset   Hypertension Mother    Cancer Mother 55       ovarian   Heart disease Father        CHF   Heart attack Father 96   Cancer Sister        breast- just finished her last radiation   Heart disease Maternal Grandfather        ?   Heart attack Paternal Grandfather 28   Heart attack Paternal Uncle 39       Sudden death   Diabetes Neg Hx     Social  History   Socioeconomic History   Marital status: Married    Spouse name: Not on file   Number of children: 2   Years of education: Not on file   Highest education level: Not on file  Occupational History   Not on file  Tobacco Use   Smoking status: Former    Current packs/day: 0.00    Average packs/day: 1 pack/day for 4.0 years (4.0 ttl pk-yrs)    Types: Cigarettes    Start date: 06/14/1963    Quit date: 06/14/1967    Years since quitting: 56.8   Smokeless tobacco: Never  Substance and Sexual Activity   Alcohol use: No    Alcohol/week: 0.0 standard drinks of alcohol   Drug use: No   Sexual activity: Yes    Comment: lives with wife  Other Topics Concern   Not on file  Social History Narrative   Retired international aid/development worker improvement.  Lives with wife.     Social Drivers of Health   Tobacco Use: Medium Risk (03/22/2024)   Patient History    Smoking Tobacco Use: Former    Smokeless Tobacco Use: Never    Passive Exposure: Not on file  Financial Resource Strain: Low Risk (12/06/2023)   Overall Financial Resource Strain (CARDIA)    Difficulty of Paying Living Expenses: Not hard at all  Food Insecurity: No Food Insecurity (12/06/2023)   Epic    Worried About Programme Researcher, Broadcasting/film/video in the Last Year: Never true    Ran Out of Food in the Last Year: Never true  Transportation Needs: No Transportation Needs (12/06/2023)   Epic    Lack of Transportation (Medical): No    Lack of Transportation (Non-Medical): No  Physical Activity: Sufficiently Active (12/06/2023)   Exercise Vital Sign    Days of Exercise per Week: 5 days    Minutes of Exercise per Session: 30 min  Stress: No Stress Concern Present (12/06/2023)   [employer] of Occupational Health - Occupational Stress Questionnaire    Feeling of Stress: Not at all  Social Connections: Socially Integrated (12/06/2023)   Social Connection and Isolation Panel    Frequency of Communication with Friends and Family: More than three times a week     Frequency of Social Gatherings with Friends and Family: More than three times a week    Attends Religious Services: More than 4 times per year    Active Member of [employer] or Organizations: Yes    Attends Banker Meetings: More than 4 times per year    Marital Status: Married  Catering Manager Violence: Not At Risk (12/06/2023)   Epic    Fear of Current or Ex-Partner: No  Emotionally Abused: No    Physically Abused: No    Sexually Abused: No  Depression (PHQ2-9): Low Risk (12/06/2023)   Depression (PHQ2-9)    PHQ-2 Score: 0  Alcohol Screen: Low Risk (12/06/2023)   Alcohol Screen    Last Alcohol Screening Score (AUDIT): 0  Housing: Low Risk (12/06/2023)   Epic    Unable to Pay for Housing in the Last Year: No    Number of Times Moved in the Last Year: 0    Homeless in the Last Year: No  Utilities: Not At Risk (12/06/2023)   Epic    Threatened with loss of utilities: No  Health Literacy: Adequate Health Literacy (12/06/2023)   B1300 Health Literacy    Frequency of need for help with medical instructions: Never    Outpatient Medications Prior to Visit  Medication Sig Dispense Refill   acetaminophen  (TYLENOL ) 500 MG tablet Take 1,000 mg by mouth every 6 (six) hours as needed for mild pain. Reported on 03/31/2015     aspirin  EC 81 MG tablet Take 81 mg by mouth daily with breakfast.      Cholecalciferol 50 MCG (2000 UT) TABS Take 2 tablets by mouth daily. 9/23 Will be stopping per [facility] when completes current Rx.     clotrimazole -betamethasone  (LOTRISONE ) cream Apply 1 Application topically 2 (two) times daily. 60 g 2   Continuous Glucose Sensor (FREESTYLE LIBRE 3 SENSOR) MISC 1 each by Does not apply route. Every 15 days     empagliflozin (JARDIANCE) 10 MG TABS tablet Take 10 mg by mouth daily.     ezetimibe  (ZETIA ) 10 MG tablet Take 1 tablet (10 mg total) by mouth daily. 90 tablet 3   glucagon  (GLUCAGON  EMERGENCY) 1 MG injection Inject 1 mg into the muscle once as needed for  up to 1 dose. 1 each 12   insulin  aspart (NOVOLOG ) 100 UNIT/ML injection Inject 15 Units into the skin 3 (three) times daily before meals. Inject in skin before meals (Patient taking differently: Inject 15 Units into the skin 3 (three) times daily before meals. Inject in skin before meals and on a sliding scale for elevated BS)     insulin  glargine (LANTUS ) 100 UNIT/ML injection Inject 40-50 Units into the skin daily.     ipratropium (ATROVENT) 0.03 % nasal spray Place 2 sprays into the nose 3 (three) times daily.     losartan  (COZAAR ) 100 MG tablet Take 1 tablet (100 mg total) by mouth daily. (Patient taking differently: Take 75 mg by mouth daily.) 90 tablet 0   metoprolol  tartrate (LOPRESSOR ) 25 MG tablet Take 1 tablet (25 mg total) by mouth 2 (two) times daily. 180 tablet 3   Multiple Vitamin (MULTIVITAMIN WITH MINERALS) TABS tablet Take 1 tablet by mouth daily.     nitroGLYCERIN  (NITROSTAT ) 0.4 MG SL tablet Place 1 tablet (0.4 mg total) under the tongue every 5 (five) minutes as needed for chest pain. 25 tablet 3   prasugrel  (EFFIENT ) 10 MG TABS tablet Take 1 tablet (10 mg total) by mouth daily. 90 tablet 3   PRESCRIPTION MEDICATION Apply 1 Application topically 2 (two) times daily as needed. MENTHOL /METHYL SALICYLATE (10-15%) LOW CONC. CREAM,TOP (for bursitis)     simvastatin  (ZOCOR ) 40 MG tablet TAKE 1 TABLET (40 MG) BY MOUTH AT BEDTIME. 90 tablet 2   Accu-Chek FastClix Lancets MISC Use to test blood sugar 2 times daily. Dx: E11.40 204 each 5   famotidine  (PEPCID ) 20 MG tablet Take 1 tablet (20 mg total)  by mouth at bedtime. (Patient not taking: Reported on 04/12/2023) 30 tablet 3   No facility-administered medications prior to visit.    Allergies[1]  Review of Systems  Constitutional:  Negative for fever and malaise/fatigue.  HENT:  Negative for congestion.   Eyes:  Negative for blurred vision.  Respiratory:  Negative for shortness of breath.   Cardiovascular:  Negative for chest pain,  palpitations and leg swelling.  Gastrointestinal:  Negative for abdominal pain, blood in stool and nausea.  Genitourinary:  Negative for dysuria and frequency.  Musculoskeletal:  Negative for falls.  Skin:  Negative for rash.  Neurological:  Negative for dizziness, loss of consciousness and headaches.  Endo/Heme/Allergies:  Negative for environmental allergies. Bruises/bleeds easily.  Psychiatric/Behavioral:  Negative for depression. The patient is not nervous/anxious.        Objective:    Physical Exam Vitals reviewed.  Constitutional:      Appearance: Normal appearance. He is not ill-appearing.  HENT:     Head: Normocephalic and atraumatic.     Nose: Nose normal.  Eyes:     Conjunctiva/sclera: Conjunctivae normal.  Cardiovascular:     Rate and Rhythm: Normal rate.     Pulses: Normal pulses.     Heart sounds: Murmur heard.  Pulmonary:     Effort: Pulmonary effort is normal.     Breath sounds: Normal breath sounds. No wheezing.  Abdominal:     Palpations: Abdomen is soft. There is no mass.     Tenderness: There is no abdominal tenderness.  Musculoskeletal:     Cervical back: Normal range of motion.     Right lower leg: No edema.     Left lower leg: No edema.  Skin:    General: Skin is warm and dry.  Neurological:     General: No focal deficit present.     Mental Status: He is alert and oriented to person, place, and time.  Psychiatric:        Mood and Affect: Mood normal.     BP 118/62   Pulse (!) 58   Temp 98 F (36.7 C)   Resp 16   Ht 5' 10 (1.778 m)   Wt 237 lb (107.5 kg)   SpO2 97%   BMI 34.01 kg/m  Wt Readings from Last 3 Encounters:  03/22/24 237 lb (107.5 kg)  12/06/23 238 lb (108 kg)  05/05/23 238 lb 9.6 oz (108.2 kg)    Diabetic Foot Exam - Simple   No data filed    Lab Results  Component Value Date   WBC 7.2 11/28/2023   HGB 16.1 11/28/2023   HCT 46 11/28/2023   PLT 160 11/28/2023   GLUCOSE 107 (H) 10/28/2022   CHOL 133 10/28/2022    TRIG 111.0 10/28/2022   HDL 36.90 (L) 10/28/2022   LDLDIRECT 108.0 04/16/2014   LDLCALC 74 10/28/2022   ALT 25 11/28/2023   AST 27 11/28/2023   NA 139 11/28/2023   K 4.7 11/28/2023   CL 103 11/28/2023   CREATININE 0.9 11/28/2023   BUN 15 11/28/2023   CO2 28 (A) 11/28/2023   TSH 2.85 10/28/2022   PSA 1.92 09/25/2018   INR 1.02 05/17/2014   HGBA1C 7.0 11/28/2023   MICROALBUR <0.3 06/13/2023    Lab Results  Component Value Date   TSH 2.85 10/28/2022   Lab Results  Component Value Date   WBC 7.2 11/28/2023   HGB 16.1 11/28/2023   HCT 46 11/28/2023   MCV 96.3 10/28/2022  PLT 160 11/28/2023   Lab Results  Component Value Date   NA 139 11/28/2023   K 4.7 11/28/2023   CO2 28 (A) 11/28/2023   GLUCOSE 107 (H) 10/28/2022   BUN 15 11/28/2023   CREATININE 0.9 11/28/2023   BILITOT 0.8 10/28/2022   ALKPHOS 88 11/28/2023   AST 27 11/28/2023   ALT 25 11/28/2023   PROT 6.8 10/28/2022   ALBUMIN 4.2 10/28/2022   CALCIUM 9.5 11/28/2023   ANIONGAP 8 05/18/2014   EGFR 86 11/28/2023   GFR 81.91 10/28/2022   Lab Results  Component Value Date   CHOL 133 10/28/2022   Lab Results  Component Value Date   HDL 36.90 (L) 10/28/2022   Lab Results  Component Value Date   LDLCALC 74 10/28/2022   Lab Results  Component Value Date   TRIG 111.0 10/28/2022   Lab Results  Component Value Date   CHOLHDL 4 10/28/2022   Lab Results  Component Value Date   HGBA1C 7.0 11/28/2023       Assessment & Plan:  Primary hypertension -     CBC with Differential/Platelet; Future -     TSH; Future  Well controlled type 2 diabetes mellitus with peripheral neuropathy (HCC) Assessment & Plan: hgba1c acceptable, minimize simple carbs. Increase exercise as tolerated. Continue current meds.   Orders: -     Comprehensive metabolic panel with GFR; Future -     Hemoglobin A1c; Future -     Microalbumin / creatinine urine ratio; Future -     Accu-Chek FastClix Lancets; Use to test blood sugar  2 times daily. Dx: E11.40  Dispense: 102 each; Refill: 6  Mixed hyperlipidemia -     Lipid panel; Future  Vitamin D  deficiency -     VITAMIN D  25 Hydroxy (Vit-D Deficiency, Fractures); Future    Assessment and Plan Assessment & Plan Type 2 diabetes mellitus with peripheral neuropathy Diabetes management is ongoing with insulin  and oral medications. Peripheral neuropathy is present, with recent foot care addressing a corn and fungal infection. - Continue current diabetes management regimen. - Apply clotrimazole  and betamethasone  cream to affected areas for two weeks. - Ensure proper foot hygiene and drying to prevent fungal infections.  Mixed hyperlipidemia Lipid panel is pending to guide potential statin therapy. Current management includes simvastatin . Discussion about Repatha for LPA reduction, but cost and insurance coverage are concerns. He prefers to wait for a new drug with higher efficacy. - Ordered lipid panel. - Continue simvastatin  until lipid panel results are available.  Primary hypertension Blood pressure management is ongoing with current medications. - Continue current antihypertensive regimen  Easy bruising Likely related to age and medication use (Effient  and aspirin ). No signs of serious bleeding issues. - Continue to monitor for signs of serious bleeding such as blood in stool, urine, or gums.  Tinea pedis Fungal infection between toes, likely exacerbated by diabetes. Recent treatment with clotrimazole  and betamethasone  cream has shown improvement. - Continue clotrimazole  and betamethasone  cream for two weeks. - Ensure proper foot hygiene and drying to prevent recurrence.  General Health Maintenance Discussion about the importance of vaccinations and herd immunity. Concerns about potential loss of access to vaccines due to insurance and political factors. - Encouraged vaccination to maintain herd immunity.  Recording duration: 32 minutes     Harlene Horton, MD    [1]  Allergies Allergen Reactions   Ciprofloxacin Other (See Comments)    Abdominal pain   Lipitor [Atorvastatin Calcium] Other (See Comments)  Severe muscle pain   Lisinopril Cough   Metformin And Related Diarrhea   "

## 2024-03-18 NOTE — Assessment & Plan Note (Signed)
 hgba1c acceptable, minimize simple carbs. Increase exercise as tolerated. Continue current meds

## 2024-03-20 ENCOUNTER — Ambulatory Visit: Admitting: Podiatry

## 2024-03-20 ENCOUNTER — Other Ambulatory Visit (HOSPITAL_BASED_OUTPATIENT_CLINIC_OR_DEPARTMENT_OTHER): Payer: Self-pay

## 2024-03-20 DIAGNOSIS — M79674 Pain in right toe(s): Secondary | ICD-10-CM | POA: Diagnosis not present

## 2024-03-20 DIAGNOSIS — M79675 Pain in left toe(s): Secondary | ICD-10-CM | POA: Diagnosis not present

## 2024-03-20 DIAGNOSIS — B351 Tinea unguium: Secondary | ICD-10-CM | POA: Diagnosis not present

## 2024-03-20 DIAGNOSIS — E1149 Type 2 diabetes mellitus with other diabetic neurological complication: Secondary | ICD-10-CM

## 2024-03-20 DIAGNOSIS — B353 Tinea pedis: Secondary | ICD-10-CM

## 2024-03-20 MED ORDER — CLOTRIMAZOLE-BETAMETHASONE 1-0.05 % EX CREA
1.0000 | TOPICAL_CREAM | Freq: Two times a day (BID) | CUTANEOUS | 2 refills | Status: AC
  Filled 2024-03-20: qty 15, 30d supply, fill #0

## 2024-03-20 NOTE — Progress Notes (Unsigned)
 Subjective: Chief Complaint  Patient presents with   Diabetes    IDDM patient presents today for [facility] last A1C 7.2    79 y.o. returns the office today for painful, elongated, thickened toenails which he cannot trim himself. No open lesions.   PCP: Domenica Harlene LABOR, MD -last seen 05/05/2023 Endocrinologist: Dr. Tawni Fendt, MD   Last A1c was 7.0 on 11/28/2023  Objective: AAO 3, NAD-presents with wife DP/PT pulses palpable, CRT less than 3 seconds Sensation is intact today  SWMF.  Nails hypertrophic, dystrophic, elongated, brittle, discolored 10. There is tenderness overlying the nails 1-5 bilaterally. There is no surrounding erythema or drainage along the nail sites. Hyperkeratotic lesion adjacent of the left fifth toe.  No ulceration. Interdigital dry, peeling erythematous skin present. No pain with calf compression, swelling, warmth, erythema.  Assessment: Patient presents with symptomatic onychomycosis; neuropathy; tinea pedis  Plan: -Treatment options including alternatives, risks, complications were discussed -Nails sharply debrided 10 without complication/bleeding. -Debrided left fifth digit skin lesion and minimal bleeding occurred.  Antibiotic ointment applied followed by dressing.  Discussed this with the patient monitor any signs or symptoms of infection.  Not healed or resolved next couple weeks let me know or sooner if there is any changes or worsening. -Prescribe Lotrisone  for the skin -Follow-up as scheduled or sooner if any problems are to arise. In the meantime, encouraged to call the office with any questions, concerns, changes symptoms.  Return in about 61 weeks (around 05/21/2025).  Donnice JONELLE Fees DPM

## 2024-03-22 ENCOUNTER — Ambulatory Visit: Admitting: Family Medicine

## 2024-03-22 ENCOUNTER — Encounter: Payer: Self-pay | Admitting: Family Medicine

## 2024-03-22 VITALS — BP 118/62 | HR 58 | Temp 98.0°F | Resp 16 | Ht 70.0 in | Wt 237.0 lb

## 2024-03-22 DIAGNOSIS — I10 Essential (primary) hypertension: Secondary | ICD-10-CM

## 2024-03-22 DIAGNOSIS — E782 Mixed hyperlipidemia: Secondary | ICD-10-CM | POA: Diagnosis not present

## 2024-03-22 DIAGNOSIS — Z7984 Long term (current) use of oral hypoglycemic drugs: Secondary | ICD-10-CM

## 2024-03-22 DIAGNOSIS — E559 Vitamin D deficiency, unspecified: Secondary | ICD-10-CM | POA: Diagnosis not present

## 2024-03-22 DIAGNOSIS — E1142 Type 2 diabetes mellitus with diabetic polyneuropathy: Secondary | ICD-10-CM | POA: Diagnosis not present

## 2024-03-22 MED ORDER — ACCU-CHEK FASTCLIX LANCETS MISC: 6 refills | Status: AC

## 2024-03-25 ENCOUNTER — Encounter: Payer: Self-pay | Admitting: Family Medicine

## 2024-03-28 ENCOUNTER — Other Ambulatory Visit

## 2024-04-06 ENCOUNTER — Ambulatory Visit: Payer: Self-pay | Admitting: Family Medicine

## 2024-04-06 ENCOUNTER — Other Ambulatory Visit (INDEPENDENT_AMBULATORY_CARE_PROVIDER_SITE_OTHER)

## 2024-04-06 DIAGNOSIS — E1142 Type 2 diabetes mellitus with diabetic polyneuropathy: Secondary | ICD-10-CM | POA: Diagnosis not present

## 2024-04-06 DIAGNOSIS — E782 Mixed hyperlipidemia: Secondary | ICD-10-CM | POA: Diagnosis not present

## 2024-04-06 DIAGNOSIS — E559 Vitamin D deficiency, unspecified: Secondary | ICD-10-CM | POA: Diagnosis not present

## 2024-04-06 DIAGNOSIS — I10 Essential (primary) hypertension: Secondary | ICD-10-CM | POA: Diagnosis not present

## 2024-04-06 LAB — COMPREHENSIVE METABOLIC PANEL WITH GFR
ALT: 22 U/L (ref 3–53)
AST: 22 U/L (ref 5–37)
Albumin: 4.1 g/dL (ref 3.5–5.2)
Alkaline Phosphatase: 87 U/L (ref 39–117)
BUN: 21 mg/dL (ref 6–23)
CO2: 30 meq/L (ref 19–32)
Calcium: 8.9 mg/dL (ref 8.4–10.5)
Chloride: 102 meq/L (ref 96–112)
Creatinine, Ser: 0.92 mg/dL (ref 0.40–1.50)
GFR: 78.97 mL/min
Glucose, Bld: 156 mg/dL — ABNORMAL HIGH (ref 70–99)
Potassium: 4.2 meq/L (ref 3.5–5.1)
Sodium: 138 meq/L (ref 135–145)
Total Bilirubin: 0.8 mg/dL (ref 0.2–1.2)
Total Protein: 6.8 g/dL (ref 6.0–8.3)

## 2024-04-06 LAB — CBC WITH DIFFERENTIAL/PLATELET
Basophils Absolute: 0 K/uL (ref 0.0–0.1)
Basophils Relative: 0.7 % (ref 0.0–3.0)
Eosinophils Absolute: 0.2 K/uL (ref 0.0–0.7)
Eosinophils Relative: 3.4 % (ref 0.0–5.0)
HCT: 46.3 % (ref 39.0–52.0)
Hemoglobin: 15.5 g/dL (ref 13.0–17.0)
Lymphocytes Relative: 43.2 % (ref 12.0–46.0)
Lymphs Abs: 2.4 K/uL (ref 0.7–4.0)
MCHC: 33.4 g/dL (ref 30.0–36.0)
MCV: 94.6 fl (ref 78.0–100.0)
Monocytes Absolute: 0.4 K/uL (ref 0.1–1.0)
Monocytes Relative: 7.7 % (ref 3.0–12.0)
Neutro Abs: 2.5 K/uL (ref 1.4–7.7)
Neutrophils Relative %: 45 % (ref 43.0–77.0)
Platelets: 136 K/uL — ABNORMAL LOW (ref 150.0–400.0)
RBC: 4.9 Mil/uL (ref 4.22–5.81)
RDW: 14.7 % (ref 11.5–15.5)
WBC: 5.5 K/uL (ref 4.0–10.5)

## 2024-04-06 LAB — LIPID PANEL
Cholesterol: 97 mg/dL (ref 28–200)
HDL: 38.6 mg/dL — ABNORMAL LOW
LDL Cholesterol: 40 mg/dL (ref 10–99)
NonHDL: 58.88
Total CHOL/HDL Ratio: 3
Triglycerides: 94 mg/dL (ref 10.0–149.0)
VLDL: 18.8 mg/dL (ref 0.0–40.0)

## 2024-04-06 LAB — VITAMIN D 25 HYDROXY (VIT D DEFICIENCY, FRACTURES): VITD: 27.66 ng/mL — ABNORMAL LOW (ref 30.00–100.00)

## 2024-04-06 LAB — MICROALBUMIN / CREATININE URINE RATIO
Creatinine,U: 87.2 mg/dL
Microalb Creat Ratio: UNDETERMINED mg/g (ref 0.0–30.0)
Microalb, Ur: <0.7 mg/dL

## 2024-04-06 LAB — HEMOGLOBIN A1C: Hgb A1c MFr Bld: 7.9 % — ABNORMAL HIGH (ref 4.6–6.5)

## 2024-04-06 LAB — TSH: TSH: 3.2 u[IU]/mL (ref 0.35–5.50)

## 2024-04-10 NOTE — Progress Notes (Signed)
"  Pt reviewed via MyChart.   "

## 2024-04-25 ENCOUNTER — Ambulatory Visit: Admitting: Pulmonary Disease

## 2024-05-21 ENCOUNTER — Ambulatory Visit: Admitting: Podiatry

## 2024-09-27 ENCOUNTER — Ambulatory Visit: Admitting: Family Medicine

## 2024-12-12 ENCOUNTER — Ambulatory Visit
